# Patient Record
Sex: Male | Born: 1945 | Race: Black or African American | Hispanic: No | Marital: Married | Smoking: Former smoker
Health system: Southern US, Community
[De-identification: ages and names within clinical notes are randomized; demographics above are authoritative.]

## PROBLEM LIST (undated history)

## (undated) DIAGNOSIS — Z5189 Encounter for other specified aftercare: Secondary | ICD-10-CM

## (undated) DIAGNOSIS — N186 End stage renal disease: Secondary | ICD-10-CM

## (undated) DIAGNOSIS — I10 Essential (primary) hypertension: Secondary | ICD-10-CM

## (undated) DIAGNOSIS — R410 Disorientation, unspecified: Secondary | ICD-10-CM

## (undated) DIAGNOSIS — E785 Hyperlipidemia, unspecified: Secondary | ICD-10-CM

## (undated) DIAGNOSIS — R29818 Other symptoms and signs involving the nervous system: Secondary | ICD-10-CM

## (undated) DIAGNOSIS — Z992 Dependence on renal dialysis: Secondary | ICD-10-CM

## (undated) DIAGNOSIS — N281 Cyst of kidney, acquired: Secondary | ICD-10-CM

## (undated) DIAGNOSIS — T7840XA Allergy, unspecified, initial encounter: Secondary | ICD-10-CM

## (undated) DIAGNOSIS — C801 Malignant (primary) neoplasm, unspecified: Secondary | ICD-10-CM

## (undated) DIAGNOSIS — D509 Iron deficiency anemia, unspecified: Secondary | ICD-10-CM

## (undated) HISTORY — DX: Malignant (primary) neoplasm, unspecified: C80.1

## (undated) HISTORY — DX: Dependence on renal dialysis: Z99.2

## (undated) HISTORY — DX: Essential (primary) hypertension: I10

## (undated) HISTORY — DX: Allergy, unspecified, initial encounter: T78.40XA

## (undated) HISTORY — DX: End stage renal disease: N18.6

## (undated) HISTORY — DX: Disorientation, unspecified: R41.0

## (undated) HISTORY — DX: Cyst of kidney, acquired: N28.1

## (undated) HISTORY — DX: Iron deficiency anemia, unspecified: D50.9

## (undated) HISTORY — DX: Encounter for other specified aftercare: Z51.89

## (undated) HISTORY — DX: Hyperlipidemia, unspecified: E78.5

---
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
## 1972-01-26 DIAGNOSIS — I10 Essential (primary) hypertension: Secondary | ICD-10-CM

## 1972-01-26 HISTORY — DX: Essential (primary) hypertension: I10

## 1990-01-25 HISTORY — PX: ACHILLES TENDON REPAIR: SUR1153

## 1997-01-25 DIAGNOSIS — E785 Hyperlipidemia, unspecified: Secondary | ICD-10-CM

## 1997-01-25 HISTORY — DX: Hyperlipidemia, unspecified: E78.5

## 1997-10-04 ENCOUNTER — Encounter: Admission: RE | Admit: 1997-10-04 | Discharge: 1997-10-04 | Payer: Self-pay | Admitting: *Deleted

## 1998-01-25 HISTORY — PX: MR MRA ABDOMEN: HXRAD3

## 1998-01-25 HISTORY — PX: NEPHRECTOMY: SHX65

## 1998-07-09 ENCOUNTER — Encounter: Payer: Self-pay | Admitting: Urology

## 1998-07-09 ENCOUNTER — Ambulatory Visit (HOSPITAL_COMMUNITY): Admission: RE | Admit: 1998-07-09 | Discharge: 1998-07-09 | Payer: Self-pay | Admitting: Urology

## 1998-07-18 ENCOUNTER — Ambulatory Visit (HOSPITAL_COMMUNITY): Admission: RE | Admit: 1998-07-18 | Discharge: 1998-07-18 | Payer: Self-pay | Admitting: Urology

## 1998-07-18 ENCOUNTER — Encounter: Payer: Self-pay | Admitting: Urology

## 1998-12-16 ENCOUNTER — Emergency Department (HOSPITAL_COMMUNITY): Admission: EM | Admit: 1998-12-16 | Discharge: 1998-12-16 | Payer: Self-pay | Admitting: Emergency Medicine

## 2000-06-25 ENCOUNTER — Encounter: Payer: Self-pay | Admitting: Family Medicine

## 2000-06-25 LAB — CONVERTED CEMR LAB: PSA: 2.9 ng/mL

## 2001-07-20 ENCOUNTER — Ambulatory Visit (HOSPITAL_COMMUNITY): Admission: RE | Admit: 2001-07-20 | Discharge: 2001-07-20 | Payer: Self-pay | Admitting: Internal Medicine

## 2001-08-01 ENCOUNTER — Encounter: Payer: Self-pay | Admitting: Internal Medicine

## 2001-08-01 ENCOUNTER — Ambulatory Visit (HOSPITAL_COMMUNITY): Admission: RE | Admit: 2001-08-01 | Discharge: 2001-08-01 | Payer: Self-pay | Admitting: Internal Medicine

## 2002-07-26 ENCOUNTER — Encounter: Payer: Self-pay | Admitting: Family Medicine

## 2002-07-27 ENCOUNTER — Encounter: Payer: Self-pay | Admitting: Family Medicine

## 2002-07-27 LAB — CONVERTED CEMR LAB: PSA: 1.65 ng/mL

## 2002-07-31 ENCOUNTER — Encounter: Payer: Self-pay | Admitting: Family Medicine

## 2002-07-31 LAB — CONVERTED CEMR LAB: WBC, blood: 4.7 10*3/uL

## 2002-11-06 ENCOUNTER — Encounter: Payer: Self-pay | Admitting: Family Medicine

## 2002-11-19 ENCOUNTER — Encounter: Payer: Self-pay | Admitting: Family Medicine

## 2002-11-19 LAB — CONVERTED CEMR LAB: Blood Glucose, Fasting: 89 mg/dL

## 2002-11-30 ENCOUNTER — Encounter: Admission: RE | Admit: 2002-11-30 | Discharge: 2002-11-30 | Payer: Self-pay | Admitting: Internal Medicine

## 2003-01-30 ENCOUNTER — Encounter: Payer: Self-pay | Admitting: Family Medicine

## 2003-01-30 LAB — CONVERTED CEMR LAB: Blood Glucose, Fasting: 94 mg/dL

## 2003-03-13 ENCOUNTER — Encounter: Payer: Self-pay | Admitting: Family Medicine

## 2003-03-13 LAB — CONVERTED CEMR LAB: Blood Glucose, Fasting: 96 mg/dL

## 2003-04-24 ENCOUNTER — Encounter: Payer: Self-pay | Admitting: Family Medicine

## 2003-05-15 ENCOUNTER — Ambulatory Visit (HOSPITAL_COMMUNITY): Admission: RE | Admit: 2003-05-15 | Discharge: 2003-05-15 | Payer: Self-pay | Admitting: Gastroenterology

## 2003-05-15 ENCOUNTER — Encounter (INDEPENDENT_AMBULATORY_CARE_PROVIDER_SITE_OTHER): Payer: Self-pay | Admitting: *Deleted

## 2003-05-15 HISTORY — PX: COLONOSCOPY: SHX174

## 2003-07-03 ENCOUNTER — Encounter: Payer: Self-pay | Admitting: Family Medicine

## 2003-07-03 LAB — CONVERTED CEMR LAB
Blood Glucose, Fasting: 109 mg/dL
RBC count: 4.12 10*6/uL
WBC, blood: 5.1 10*3/uL

## 2003-07-15 ENCOUNTER — Encounter: Payer: Self-pay | Admitting: Family Medicine

## 2003-07-15 LAB — CONVERTED CEMR LAB
Blood Glucose, Fasting: 71 mg/dL
RBC count: 4.35 10*6/mm3
WBC, blood: 4.5 10*3/mm3

## 2004-01-13 ENCOUNTER — Encounter: Payer: Self-pay | Admitting: Family Medicine

## 2004-01-13 LAB — CONVERTED CEMR LAB: RBC count: 4.24 10*6/uL

## 2004-04-21 ENCOUNTER — Encounter: Payer: Self-pay | Admitting: Family Medicine

## 2004-04-21 LAB — CONVERTED CEMR LAB
Blood Glucose, Fasting: 85 mg/dL
RBC count: 4.2 10*6/uL
WBC, blood: 4.4 10*3/uL

## 2004-04-27 ENCOUNTER — Encounter: Payer: Self-pay | Admitting: Family Medicine

## 2004-04-27 LAB — CONVERTED CEMR LAB
Blood Glucose, Fasting: 94 mg/dL
WBC, blood: 4.8 10*3/uL

## 2004-07-20 ENCOUNTER — Encounter: Payer: Self-pay | Admitting: Family Medicine

## 2004-07-26 ENCOUNTER — Emergency Department (HOSPITAL_COMMUNITY): Admission: EM | Admit: 2004-07-26 | Discharge: 2004-07-26 | Payer: Self-pay | Admitting: Emergency Medicine

## 2004-08-17 ENCOUNTER — Encounter: Payer: Self-pay | Admitting: Family Medicine

## 2004-08-17 LAB — CONVERTED CEMR LAB
RBC count: 3.97 10*6/uL
WBC, blood: 11.1 10*3/uL

## 2004-10-26 ENCOUNTER — Encounter: Payer: Self-pay | Admitting: Family Medicine

## 2004-10-26 LAB — CONVERTED CEMR LAB
Blood Glucose, Fasting: 91 mg/dL
RBC count: 4.08 10*6/uL
WBC, blood: 7 10*3/uL

## 2004-11-09 ENCOUNTER — Encounter: Admission: RE | Admit: 2004-11-09 | Discharge: 2004-11-09 | Payer: Self-pay | Admitting: Orthopedic Surgery

## 2004-11-15 ENCOUNTER — Encounter: Admission: RE | Admit: 2004-11-15 | Discharge: 2004-11-15 | Payer: Self-pay | Admitting: Orthopedic Surgery

## 2004-12-01 ENCOUNTER — Encounter: Admission: RE | Admit: 2004-12-01 | Discharge: 2004-12-01 | Payer: Self-pay | Admitting: Orthopedic Surgery

## 2004-12-21 ENCOUNTER — Encounter: Payer: Self-pay | Admitting: Family Medicine

## 2004-12-21 LAB — CONVERTED CEMR LAB: Blood Glucose, Fasting: 87 mg/dL

## 2005-03-10 ENCOUNTER — Encounter: Payer: Self-pay | Admitting: Family Medicine

## 2005-03-15 ENCOUNTER — Encounter: Payer: Self-pay | Admitting: Family Medicine

## 2005-03-15 LAB — CONVERTED CEMR LAB: RBC count: 3.57 10*6/uL

## 2005-03-22 ENCOUNTER — Encounter: Payer: Self-pay | Admitting: Family Medicine

## 2005-03-22 LAB — CONVERTED CEMR LAB: Blood Glucose, Fasting: 89 mg/dL

## 2005-04-21 ENCOUNTER — Encounter: Payer: Self-pay | Admitting: Family Medicine

## 2005-04-21 LAB — CONVERTED CEMR LAB
Blood Glucose, Fasting: 86 mg/dL
RBC count: 3.73 10*6/uL
WBC, blood: 5.1 10*3/uL

## 2005-07-16 ENCOUNTER — Ambulatory Visit: Payer: Self-pay | Admitting: Family Medicine

## 2005-12-14 ENCOUNTER — Ambulatory Visit: Payer: Self-pay | Admitting: Family Medicine

## 2005-12-14 LAB — CONVERTED CEMR LAB
Blood Glucose, Fasting: 77 mg/dL
TSH: 0.47 microintl units/mL

## 2005-12-21 ENCOUNTER — Ambulatory Visit: Payer: Self-pay | Admitting: Family Medicine

## 2006-01-11 ENCOUNTER — Ambulatory Visit: Payer: Self-pay | Admitting: Family Medicine

## 2006-01-23 ENCOUNTER — Emergency Department (HOSPITAL_COMMUNITY): Admission: EM | Admit: 2006-01-23 | Discharge: 2006-01-23 | Payer: Self-pay | Admitting: Family Medicine

## 2006-01-23 ENCOUNTER — Encounter: Payer: Self-pay | Admitting: Family Medicine

## 2006-01-23 LAB — CONVERTED CEMR LAB
RBC count: 4.21 10*6/uL
WBC, blood: 11.5 10*3/uL

## 2006-01-26 ENCOUNTER — Ambulatory Visit: Payer: Self-pay | Admitting: Family Medicine

## 2006-02-20 ENCOUNTER — Encounter: Admission: RE | Admit: 2006-02-20 | Discharge: 2006-02-20 | Payer: Self-pay | Admitting: Orthopedic Surgery

## 2006-03-03 HISTORY — PX: CT ABD W & PELVIS WO CM: HXRAD294

## 2006-03-03 HISTORY — PX: CYSTOSCOPY: SUR368

## 2006-03-10 ENCOUNTER — Ambulatory Visit: Payer: Self-pay | Admitting: Family Medicine

## 2006-03-10 LAB — CONVERTED CEMR LAB
BUN: 81 mg/dL — ABNORMAL HIGH (ref 6–23)
CO2: 31 meq/L (ref 19–32)
Chloride: 95 meq/L — ABNORMAL LOW (ref 96–112)
Creatinine, Ser: 10.3 mg/dL (ref 0.4–1.5)
Potassium: 3.4 meq/L — ABNORMAL LOW (ref 3.5–5.1)
Sodium: 140 meq/L (ref 135–145)
Uric Acid, Serum: 6.8 mg/dL (ref 2.4–7.0)

## 2006-03-14 ENCOUNTER — Ambulatory Visit: Payer: Self-pay | Admitting: Family Medicine

## 2006-05-28 ENCOUNTER — Ambulatory Visit: Payer: Self-pay | Admitting: Family Medicine

## 2006-06-20 HISTORY — PX: KIDNEY TRANSPLANT: SHX239

## 2006-12-23 ENCOUNTER — Encounter: Payer: Self-pay | Admitting: Family Medicine

## 2006-12-23 DIAGNOSIS — Z87898 Personal history of other specified conditions: Secondary | ICD-10-CM | POA: Insufficient documentation

## 2006-12-23 DIAGNOSIS — Z87448 Personal history of other diseases of urinary system: Secondary | ICD-10-CM | POA: Insufficient documentation

## 2006-12-23 DIAGNOSIS — Z87891 Personal history of nicotine dependence: Secondary | ICD-10-CM | POA: Insufficient documentation

## 2006-12-26 ENCOUNTER — Ambulatory Visit: Payer: Self-pay | Admitting: Family Medicine

## 2006-12-26 DIAGNOSIS — I10 Essential (primary) hypertension: Secondary | ICD-10-CM | POA: Insufficient documentation

## 2006-12-26 DIAGNOSIS — D509 Iron deficiency anemia, unspecified: Secondary | ICD-10-CM | POA: Insufficient documentation

## 2006-12-26 DIAGNOSIS — M109 Gout, unspecified: Secondary | ICD-10-CM | POA: Insufficient documentation

## 2006-12-26 DIAGNOSIS — E785 Hyperlipidemia, unspecified: Secondary | ICD-10-CM | POA: Insufficient documentation

## 2006-12-26 LAB — CONVERTED CEMR LAB
ALT: 12 units/L (ref 0–53)
Basophils Absolute: 0 10*3/uL (ref 0.0–0.1)
Calcium: 9.5 mg/dL (ref 8.4–10.5)
Chloride: 107 meq/L (ref 96–112)
Creatinine, Ser: 2.5 mg/dL — ABNORMAL HIGH (ref 0.4–1.5)
Eosinophils Absolute: 0 10*3/uL (ref 0.0–0.6)
Eosinophils Relative: 0.7 % (ref 0.0–5.0)
GFR calc non Af Amer: 28 mL/min
LDL Cholesterol: 117 mg/dL — ABNORMAL HIGH (ref 0–99)
MCV: 84.2 fL (ref 78.0–100.0)
PSA: 1.62 ng/mL (ref 0.10–4.00)
Platelets: 282 10*3/uL (ref 150–400)
RBC: 3.35 M/uL — ABNORMAL LOW (ref 4.22–5.81)
RDW: 13.1 % (ref 11.5–14.6)
Total CHOL/HDL Ratio: 3.1
Triglycerides: 88 mg/dL (ref 0–149)
Uric Acid, Serum: 6.8 mg/dL (ref 2.4–7.0)
WBC: 5.3 10*3/uL (ref 4.5–10.5)

## 2006-12-29 ENCOUNTER — Ambulatory Visit: Payer: Self-pay | Admitting: Family Medicine

## 2007-02-10 ENCOUNTER — Ambulatory Visit: Payer: Self-pay | Admitting: Family Medicine

## 2007-02-15 ENCOUNTER — Ambulatory Visit: Payer: Self-pay | Admitting: Family Medicine

## 2007-02-23 ENCOUNTER — Encounter: Payer: Self-pay | Admitting: Family Medicine

## 2007-02-28 ENCOUNTER — Ambulatory Visit: Payer: Self-pay | Admitting: Family Medicine

## 2007-03-01 ENCOUNTER — Telehealth (INDEPENDENT_AMBULATORY_CARE_PROVIDER_SITE_OTHER): Payer: Self-pay | Admitting: *Deleted

## 2007-05-02 ENCOUNTER — Ambulatory Visit: Payer: Self-pay | Admitting: Family Medicine

## 2007-06-06 ENCOUNTER — Ambulatory Visit: Payer: Self-pay | Admitting: Family Medicine

## 2007-06-15 ENCOUNTER — Ambulatory Visit: Payer: Self-pay | Admitting: Family Medicine

## 2007-06-15 DIAGNOSIS — E876 Hypokalemia: Secondary | ICD-10-CM | POA: Insufficient documentation

## 2007-06-15 LAB — CONVERTED CEMR LAB
Calcium: 8.7 mg/dL (ref 8.4–10.5)
Creatinine, Ser: 3.3 mg/dL — ABNORMAL HIGH (ref 0.4–1.5)
GFR calc Af Amer: 24 mL/min
GFR calc non Af Amer: 20 mL/min

## 2007-06-24 ENCOUNTER — Encounter: Payer: Self-pay | Admitting: Family Medicine

## 2007-06-24 LAB — CONVERTED CEMR LAB
Calcium: 8.7 mg/dL (ref 8.4–10.5)
Glucose, Bld: 100 mg/dL — ABNORMAL HIGH (ref 70–99)
Sodium: 144 meq/L (ref 135–145)

## 2007-06-26 LAB — CONVERTED CEMR LAB
BUN: 42 mg/dL — ABNORMAL HIGH (ref 6–23)
Calcium: 9.1 mg/dL (ref 8.4–10.5)
Creatinine, Ser: 3.4 mg/dL — ABNORMAL HIGH (ref 0.4–1.5)
GFR calc Af Amer: 24 mL/min
GFR calc non Af Amer: 20 mL/min

## 2007-07-17 ENCOUNTER — Ambulatory Visit: Payer: Self-pay | Admitting: Family Medicine

## 2007-08-04 ENCOUNTER — Encounter: Payer: Self-pay | Admitting: Family Medicine

## 2007-10-25 ENCOUNTER — Ambulatory Visit: Payer: Self-pay | Admitting: Family Medicine

## 2007-11-22 ENCOUNTER — Encounter (INDEPENDENT_AMBULATORY_CARE_PROVIDER_SITE_OTHER): Payer: Self-pay | Admitting: *Deleted

## 2007-11-22 ENCOUNTER — Ambulatory Visit: Payer: Self-pay | Admitting: Family Medicine

## 2007-11-22 LAB — CONVERTED CEMR LAB
OCCULT 1: NEGATIVE
OCCULT 3: NEGATIVE

## 2007-12-25 ENCOUNTER — Ambulatory Visit: Payer: Self-pay | Admitting: Family Medicine

## 2007-12-25 LAB — CONVERTED CEMR LAB
ALT: 14 units/L (ref 0–53)
AST: 22 units/L (ref 0–37)
Albumin: 3.4 g/dL — ABNORMAL LOW (ref 3.5–5.2)
BUN: 33 mg/dL — ABNORMAL HIGH (ref 6–23)
Chloride: 111 meq/L (ref 96–112)
Cholesterol: 226 mg/dL (ref 0–200)
Creatinine, Ser: 3.4 mg/dL — ABNORMAL HIGH (ref 0.4–1.5)
GFR calc non Af Amer: 20 mL/min
HCT: 27.5 % — ABNORMAL LOW (ref 39.0–52.0)
Hemoglobin: 9.2 g/dL — ABNORMAL LOW (ref 13.0–17.0)
Iron: 42 ug/dL (ref 42–165)
Phosphorus: 3.4 mg/dL (ref 2.3–4.6)
RDW: 14.5 % (ref 11.5–14.6)
Total CHOL/HDL Ratio: 3.3
Total Protein: 6.6 g/dL (ref 6.0–8.3)

## 2007-12-27 ENCOUNTER — Ambulatory Visit: Payer: Self-pay | Admitting: Family Medicine

## 2008-02-09 ENCOUNTER — Encounter: Payer: Self-pay | Admitting: Family Medicine

## 2008-03-25 ENCOUNTER — Encounter: Payer: Self-pay | Admitting: Family Medicine

## 2008-03-31 LAB — CONVERTED CEMR LAB
ALT: 11 units/L (ref 0–53)
AST: 17 units/L (ref 0–37)
Total CHOL/HDL Ratio: 4.9
VLDL: 34 mg/dL (ref 0–40)

## 2008-04-01 ENCOUNTER — Ambulatory Visit: Payer: Self-pay | Admitting: Family Medicine

## 2008-04-01 DIAGNOSIS — R5381 Other malaise: Secondary | ICD-10-CM | POA: Insufficient documentation

## 2008-04-01 DIAGNOSIS — R5383 Other fatigue: Secondary | ICD-10-CM

## 2008-04-01 LAB — CONVERTED CEMR LAB
Albumin: 3.2 g/dL — ABNORMAL LOW (ref 3.5–5.2)
Basophils Absolute: 0 10*3/uL (ref 0.0–0.1)
Blood in Urine, dipstick: NEGATIVE
CO2: 25 meq/L (ref 19–32)
Chloride: 108 meq/L (ref 96–112)
GFR calc Af Amer: 15 mL/min
GFR calc non Af Amer: 13 mL/min
Glucose, Urine, Semiquant: NEGATIVE
Ketones, urine, test strip: NEGATIVE
Lymphocytes Relative: 18.1 % (ref 12.0–46.0)
MCHC: 33.4 g/dL (ref 30.0–36.0)
Neutro Abs: 4.7 10*3/uL (ref 1.4–7.7)
Neutrophils Relative %: 66.2 % (ref 43.0–77.0)
Nitrite: NEGATIVE
Platelets: 175 10*3/uL (ref 150–400)
Potassium: 4.1 meq/L (ref 3.5–5.1)
Protein, U semiquant: 100
RDW: 13.6 % (ref 11.5–14.6)
Sodium: 141 meq/L (ref 135–145)
Uric Acid, Serum: 6.9 mg/dL (ref 4.0–7.8)
WBC Urine, dipstick: NEGATIVE

## 2008-05-09 ENCOUNTER — Encounter: Payer: Self-pay | Admitting: Family Medicine

## 2008-07-18 ENCOUNTER — Encounter: Payer: Self-pay | Admitting: Family Medicine

## 2008-07-18 HISTORY — PX: COLONOSCOPY: SHX174

## 2008-08-09 ENCOUNTER — Emergency Department (HOSPITAL_COMMUNITY): Admission: EM | Admit: 2008-08-09 | Discharge: 2008-08-09 | Payer: Self-pay | Admitting: Emergency Medicine

## 2008-08-14 ENCOUNTER — Ambulatory Visit: Payer: Self-pay | Admitting: Family Medicine

## 2008-08-14 DIAGNOSIS — M62838 Other muscle spasm: Secondary | ICD-10-CM | POA: Insufficient documentation

## 2008-10-03 ENCOUNTER — Encounter: Payer: Self-pay | Admitting: Family Medicine

## 2008-10-14 ENCOUNTER — Encounter: Payer: Self-pay | Admitting: Family Medicine

## 2008-11-13 ENCOUNTER — Encounter: Payer: Self-pay | Admitting: Family Medicine

## 2008-11-18 ENCOUNTER — Encounter: Payer: Self-pay | Admitting: Family Medicine

## 2008-11-19 ENCOUNTER — Telehealth: Payer: Self-pay | Admitting: Family Medicine

## 2008-11-25 ENCOUNTER — Encounter: Payer: Self-pay | Admitting: Family Medicine

## 2008-11-28 ENCOUNTER — Encounter: Payer: Self-pay | Admitting: Family Medicine

## 2008-12-05 ENCOUNTER — Encounter: Payer: Self-pay | Admitting: Family Medicine

## 2008-12-23 ENCOUNTER — Encounter: Payer: Self-pay | Admitting: Family Medicine

## 2009-01-08 ENCOUNTER — Encounter: Payer: Self-pay | Admitting: Family Medicine

## 2009-02-17 ENCOUNTER — Emergency Department: Payer: Self-pay | Admitting: Emergency Medicine

## 2009-02-18 ENCOUNTER — Ambulatory Visit: Payer: Self-pay | Admitting: Family Medicine

## 2009-02-18 DIAGNOSIS — M79609 Pain in unspecified limb: Secondary | ICD-10-CM | POA: Insufficient documentation

## 2009-02-19 ENCOUNTER — Encounter: Admission: RE | Admit: 2009-02-19 | Discharge: 2009-02-19 | Payer: Self-pay | Admitting: Family Medicine

## 2009-02-24 ENCOUNTER — Encounter: Payer: Self-pay | Admitting: Family Medicine

## 2009-03-10 ENCOUNTER — Encounter: Payer: Self-pay | Admitting: Family Medicine

## 2009-03-19 ENCOUNTER — Encounter: Payer: Self-pay | Admitting: Family Medicine

## 2009-03-21 ENCOUNTER — Encounter: Payer: Self-pay | Admitting: Family Medicine

## 2009-03-24 ENCOUNTER — Encounter: Payer: Self-pay | Admitting: Family Medicine

## 2009-05-02 ENCOUNTER — Encounter: Payer: Self-pay | Admitting: Family Medicine

## 2009-06-18 ENCOUNTER — Ambulatory Visit: Payer: Self-pay | Admitting: Family Medicine

## 2009-06-18 DIAGNOSIS — T7840XA Allergy, unspecified, initial encounter: Secondary | ICD-10-CM | POA: Insufficient documentation

## 2009-06-26 ENCOUNTER — Encounter: Payer: Self-pay | Admitting: Family Medicine

## 2009-08-19 ENCOUNTER — Ambulatory Visit: Payer: Self-pay | Admitting: Family Medicine

## 2009-08-19 DIAGNOSIS — N529 Male erectile dysfunction, unspecified: Secondary | ICD-10-CM | POA: Insufficient documentation

## 2009-09-02 ENCOUNTER — Encounter (INDEPENDENT_AMBULATORY_CARE_PROVIDER_SITE_OTHER): Payer: Self-pay | Admitting: *Deleted

## 2009-10-21 ENCOUNTER — Telehealth: Payer: Self-pay | Admitting: Family Medicine

## 2009-10-29 ENCOUNTER — Ambulatory Visit: Payer: Self-pay | Admitting: Family Medicine

## 2009-10-30 ENCOUNTER — Telehealth: Payer: Self-pay | Admitting: Family Medicine

## 2009-12-15 ENCOUNTER — Encounter: Payer: Self-pay | Admitting: Family Medicine

## 2009-12-23 ENCOUNTER — Ambulatory Visit: Payer: Self-pay | Admitting: Family Medicine

## 2009-12-24 ENCOUNTER — Encounter (INDEPENDENT_AMBULATORY_CARE_PROVIDER_SITE_OTHER): Payer: Self-pay | Admitting: *Deleted

## 2009-12-24 LAB — CONVERTED CEMR LAB
Albumin: 3.5 g/dL (ref 3.5–5.2)
Alkaline Phosphatase: 68 units/L (ref 39–117)
BUN: 84 mg/dL — ABNORMAL HIGH (ref 6–23)
Basophils Relative: 1.1 % (ref 0.0–3.0)
Calcium: 8.2 mg/dL — ABNORMAL LOW (ref 8.4–10.5)
GFR calc non Af Amer: 4.04 mL/min (ref >60)
Glucose, Bld: 75 mg/dL (ref 70–99)
Hemoglobin: 10.3 g/dL — ABNORMAL LOW (ref 13.0–17.0)
Lymphocytes Relative: 20.8 % (ref 12.0–46.0)
Monocytes Relative: 14.5 % — ABNORMAL HIGH (ref 3.0–12.0)
Neutro Abs: 3 10*3/uL (ref 1.4–7.7)
RBC: 3.4 M/uL — ABNORMAL LOW (ref 4.22–5.81)
Sodium: 142 meq/L (ref 135–145)
TSH: 0.69 microintl units/mL (ref 0.35–5.50)

## 2009-12-25 ENCOUNTER — Ambulatory Visit: Payer: Self-pay | Admitting: Family Medicine

## 2009-12-26 ENCOUNTER — Encounter: Payer: Self-pay | Admitting: Family Medicine

## 2010-01-02 ENCOUNTER — Telehealth: Payer: Self-pay | Admitting: Family Medicine

## 2010-01-13 ENCOUNTER — Encounter: Payer: Self-pay | Admitting: Family Medicine

## 2010-01-22 ENCOUNTER — Ambulatory Visit
Admission: RE | Admit: 2010-01-22 | Discharge: 2010-01-22 | Payer: Self-pay | Source: Home / Self Care | Attending: Family Medicine | Admitting: Family Medicine

## 2010-01-22 DIAGNOSIS — H1130 Conjunctival hemorrhage, unspecified eye: Secondary | ICD-10-CM | POA: Insufficient documentation

## 2010-02-14 ENCOUNTER — Encounter: Payer: Self-pay | Admitting: Urology

## 2010-02-24 NOTE — Assessment & Plan Note (Signed)
Summary: NOT FEELING WELL/CLE   Vital Signs:  Patient profile:   65 year old male Height:      70 inches Weight:      197.25 pounds BMI:     28.40 Temp:     98.2 degrees F oral Pulse rate:   76 / minute Pulse rhythm:   regular BP sitting:   130 / 70  (left arm) Cuff size:   large  Vitals Entered By: Delilah Shan CMA Duncan Dull) (December 23, 2009 3:36 PM) CC: Not Feeling Well   History of Present Illness: Fatigued.  Going on for "a while now."   Weeks of symptoms. This week it increased. Waking up tired.  No FCNAV except for 1 episode of vomiting on Thanksgiving but no GI sx then.  Feels cold "all the time."  No cough, no diarrhea.  Last seen at [facility] last Monday.  Last labs done about 1 month ago at PD clinic.  Off prednisone, allopurinol, omeprazole and prograf.  Occ mild nasal congestion.   No orthostatic symptoms. No increase in edema.  No CP, SOB.  Sleeping on 1 pillow.   Allergies: 1)  ! * No Nephrotoxic Medications 2nd To Only Partial Left Kidney 2)  ! * Iodine/ Adhesive Tape  Review of Systems       See HPI.  Otherwise negative.    Physical Exam  General:  GEN: nad, alert and oriented HEENT: mucous membranes moist NECK: supple w/o LA CV: rrr.  no murmur PULM: ctab, no inc wob ABD: soft, +bs, port site clean EXT: trace ble edema SKIN: no acute rash   Impression & Recommendations:  Problem # 1:  MALAISE AND FATIGUE (ICD-780.79) >25 min spent with patient face to face.  No localizing symptoms or findings.  I would check labs below and follow clinically.  I told patient that I couldn't identify the source of his fatigue yet but that this would be a reasonable place to start.  he agreed.  Okay for outpatient follow up.  will contact with labs.  Orders: TLB-BMP (Basic Metabolic Panel-BMET) (80048-METABOL) TLB-Hepatic/Liver Function Pnl (80076-HEPATIC) TLB-TSH (Thyroid Stimulating Hormone) (84443-TSH) TLB-CBC Platelet - w/Differential (85025-CBCD)  Complete  Medication List: 1)  Amlodipine Besylate 10 Mg Tabs (Amlodipine besylate) .Marland Kitchen.. 1 daily by mouth 2)  Furosemide 40 Mg Tabs (Furosemide) .... 4 tablets daily by mouth 3)  Simvastatin 20 Mg Tabs (Simvastatin) .Marland Kitchen.. 1 by mouth qday 4)  Metolazone 2.5 Mg Tabs (Metolazone) .... Take one by mouth daily 5)  Hectorol 0.5 Mcg Caps (Doxercalciferol) .... Take one by mouth daily 6)  Sensipar 30 Mg Tabs (Cinacalcet hcl) .... Take 1 tablet by mouth once a day 7)  Viagra 100 Mg Tabs (Sildenafil citrate) .Marland Kitchen.. 1 tab by mouth qday prn 8)  Toprol Xl 100 Mg Xr24h-tab (Metoprolol succinate) .... Take 1 tablet by mouth once a day  Patient Instructions: 1)  We'll let you know about your labs.  I wouldn't change your meds in the meantime.  Let me know if you notice any changes.     Orders Added: 1)  Est. Patient Level IV [69678] 2)  TLB-BMP (Basic Metabolic Panel-BMET) [80048-METABOL] 3)  TLB-Hepatic/Liver Function Pnl [80076-HEPATIC] 4)  TLB-TSH (Thyroid Stimulating Hormone) [84443-TSH] 5)  TLB-CBC Platelet - w/Differential [85025-CBCD]    Current Allergies (reviewed today): ! * NO NEPHROTOXIC MEDICATIONS 2ND TO ONLY PARTIAL LEFT KIDNEY ! * IODINE/ ADHESIVE TAPE

## 2010-02-24 NOTE — Letter (Signed)
Summary: [facility]  [facility]   Imported By: Maryln Gottron 03/04/2009 15:44:06  _____________________________________________________________________  External Attachment:    Type:   Image     Comment:   External Document

## 2010-02-24 NOTE — Letter (Signed)
Summary: [facility] ([place])   Imported By: Sherian Rein 12/30/2009 08:18:22  _____________________________________________________________________  External Attachment:    Type:   Image     Comment:   External Document

## 2010-02-24 NOTE — Assessment & Plan Note (Signed)
Summary: PROBLEM W/ THROAT, RUNNY NOSE, CONGESTION CYD   Vital Signs:  Patient profile:   65 year old male Weight:      189.25 pounds BMI:     27.25 Temp:     97.9 degrees F oral Pulse rate:   76 / minute Pulse rhythm:   regular BP sitting:   120 / 72  (left arm) Cuff size:   regular  Vitals Entered By: Sydell Axon LPN (Jun 18, 2009 11:22 AM) CC: Throat hurts, runny nose, nasal congestion, symptoms for 2 weeks   History of Present Illness: Pt here today for a cold he has had for a couple of weeks, Throat hurts, sneezing with runny nose and coughing nonproductively. His  eyes aare also itching. He is taking Claritin but not every day. He knows not to take decongestants. He is not achey and has had no fever or chills. He hasn't been here in a while and was told to schedule a Comp Exam.  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 1 Daily By Mouth 2)  Multivitamins   Tabs (Multiple Vitamin) .Marland Kitchen.. 1daily By Mouth 3)  Prograf 1 Mg Caps (Tacrolimus) .... Take Two By Mouth Two Times A Day 4)  Prednisone 5 Mg Tabs (Prednisone) .Marland Kitchen.. 1 By Mouth Every Day 5)  Potassium Chloride Cr 8 Meq  Tbcr (Potassium Chloride) .... One Tab By Mouth Once Daily 6)  Amlodipine Besylate 10 Mg  Tabs (Amlodipine Besylate) .Marland Kitchen.. 1 Daily By Mouth 7)  Furosemide 40 Mg Tabs (Furosemide) .... 2 Tablets Daily By Mouth 8)  Simvastatin 40 Mg Tabs (Simvastatin) .... One Tab Poat Night 9)  Omeprazole 20 Mg Cpdr (Omeprazole) .Marland Kitchen.. 1 Daily By Mouth 10)  Coreg 25 Mg Tabs (Carvedilol) .... Take One By Mouth Two Times A Day 11)  Metolazone 2.5 Mg Tabs (Metolazone) .... Take One By Mouth Daily 12)  Flomax 0.4 Mg Caps (Tamsulosin Hcl) .... One Tab By Mouth Qd  Allergies: 1)  ! * No Nephrotoxic Medications 2nd To Only Partial Left Kidney 2)  ! * Iodine/ Adhesive Tape  Physical Exam  General:  Well-developed,well-nourished,in no acute distress; alert,appropriate and cooperative throughout examination, mildly inflamed. Head:  Normocephalic and atraumatic without obvious abnormalities. No apparent alopecia or balding. Sinuses NT. Eyes:  Conjunctiva clear bilaterally except lower palpebral eryhtema with good tearing. No discharge seen. Ears:  External ear exam shows no significant lesions or deformities.  Otoscopic examination reveals clear canals, tympanic membranes are intact bilaterally without bulging, retraction, inflammation or discharge. Hearing is grossly normal bilaterally. Nose:  External nasal examination shows no deformity or inflammation. Nasal mucosa are pink and moist without lesions or exudates. Mouth:  Oral mucosa and oropharynx without lesions or exudates.  Teeth in good repair. Neck:  No  deformities, masses, or tenderness noted. Lungs:  Normal respiratory effort, chest expands symmetrically. Lungs are clear to auscultation, no crackles or wheezes. Heart:  Normal rate and regular rhythm. S1 and S2 normal without gallop, murmur, click, rub or other extra sounds. Abdomen:  Bowel sounds positive,abdomen soft and non-tender without masses, organomegaly or hernias noted.   Impression & Recommendations:  Problem # 1:  ALLERGY, ENVIRONMENTAL (ICD-995.3) Assessment Deteriorated Take  Claritin regularly daily. Take Zyrtec at night for the next few weeks.  Call if still has sneezing or itchy eyes.  Complete Medication List: 1)  Allopurinol 100 Mg Tabs (Allopurinol) .Marland Kitchen.. 1 daily by mouth 2)  Multivitamins Tabs (Multiple vitamin) .Marland Kitchen.. 1daily by mouth 3)  Prograf 1 Mg Caps (Tacrolimus) .... Take two by mouth two times a day 4)  Prednisone 5 Mg Tabs (Prednisone) .Marland Kitchen.. 1 by mouth every day 5)  Potassium Chloride Cr 8 Meq Tbcr (Potassium chloride) .... One tab by mouth once daily 6)  Amlodipine Besylate 10 Mg Tabs (Amlodipine besylate) .Marland Kitchen.. 1 daily by mouth 7)  Furosemide 40 Mg Tabs (Furosemide) .... 2 tablets daily by mouth 8)  Simvastatin 40 Mg Tabs (Simvastatin) .... Take one by mouth at bedtime 9)  Omeprazole 20 Mg Cpdr (Omeprazole) .Marland Kitchen.. 1 daily by mouth 10)  Coreg 25 Mg Tabs (Carvedilol) .... Take one by mouth two times a day 11)  Metolazone 2.5 Mg Tabs (Metolazone) .... Take one by mouth daily 12)  Flomax 0.4 Mg Caps (Tamsulosin hcl) .... One tab by mouth daily 13)  Hectorol 0.5 Mcg Caps (Doxercalciferol) .... Take one by mouth daily  Patient Instructions: 1)  Call if sxs don't improve. 2)  Make appt for Comp Exam, labs prior.  Current Allergies (reviewed today): ! * NO NEPHROTOXIC MEDICATIONS 2ND TO ONLY PARTIAL LEFT KIDNEY ! * IODINE/ ADHESIVE TAPE

## 2010-02-24 NOTE — Letter (Signed)
Summary: Nadara Eaton letter  [place] [facility]  [address] [place], [place] [address]   Phone: [phone]  Fax: [phone]       09/02/2009 MRN: [id number]  JACQUAN SAVAS [address] [place], [place]  [address]  Dear Mr. BOOMER,  [facility], and [facility] announce the retirement of Arta Silence, M.D., from full-time practice at the [facility] office effective July 24, 2009 and his plans of returning part-time.  It is important to Dr. Hetty Ely and to our practice that you understand that [facility] has seven physicians in our office for your health care needs.  We will continue to offer the same exceptional care that you have today.    Dr. Hetty Ely has spoken to many of you about his plans for retirement and returning part-time in the fall.   We will continue to work with you through the transition to schedule appointments for you in the office and meet the high standards that [facility] is committed to.   Again, it is with great pleasure that we share the news that Dr. Hetty Ely will return to [facility] in October of 2011 with a reduced schedule.    If you have any questions, or would like to request an appointment with one of our physicians, please call us at [phone] and press the option for Scheduling an appointment.  We take pleasure in providing you with excellent patient care and look forward to seeing you at your next office visit.  Our [facility] Physicians are:  Tillman Abide, M.D. Laurita Quint, M.D. Roxy Manns, M.D. Kerby Nora, M.D. Hannah Beat, M.D. Ruthe Mannan, M.D. We proudly welcomed Raechel Ache, M.D. and Eustaquio Boyden, M.D. to the practice in July/August 2011.  Sincerely,  [facility]

## 2010-02-24 NOTE — Assessment & Plan Note (Signed)
Summary: dicuss bp med/alc   Vital Signs:  Patient profile:   65 year old male Height:      70 inches Weight:      192.50 pounds BMI:     27.72 Temp:     98.5 degrees F oral Pulse rate:   72 / minute Pulse rhythm:   regular BP sitting:   140 / 72  (right arm) Cuff size:   regular  Vitals Entered By: Linde Gillis CMA Duncan Dull) (October 29, 2009 4:09 PM) CC: discuss blood pressure medicine   History of Present Illness: Here to discuss BP meds.  Prev was on cardura; off it now.  Up to 25mg  two times a day of coreg.  Concerned about sexual function.  BP had been controlled before coming in here today.  Had follow up with [facility] clinic.  Less response with viagra.  As BB (with alpha component) was increased, sexual function decreased.  prev with same response (ED) other alpha blocking agents.   Followed by Dr. Ricci Barker at [facility].  Allergies: 1)  ! * No Nephrotoxic Medications 2nd To Only Partial Left Kidney 2)  ! * Iodine/ Adhesive Tape  Past History:  Past Medical History: Anemia-iron deficiency:(PRE 2004 (/ 06/2000) Gout: right knee : left great toe:(01/2002) Hyperlipidemia(1999) Hypertension: (1974) Renal cysts per [employer].  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 Ricci Barker at [facility].   Review of Systems       See HPI.  Occ itching on the back of the neck.  Otherwise negative.  Minimal edema.  No CP.  No SOB.   Physical Exam  General:  GEN: nad, alert and oriented HEENT: mucous membranes moist NECK: supple w/o LA CV: rrr.  no murmur PULM: ctab, no inc wob ABD: soft, +bs, port site clean EXT: trace ble edema SKIN: no acute rash, neck appears normal.    Impression & Recommendations:  Problem # 1:  HYPERTENSION (ICD-401.9) I will d/w Dr. Ricci Barker at [facility] re: nonalpha component med (toprol) vs. ACE vs hydralazine.  Will d/w patient after that.  He agrees.  He can use topical hydrocortisone for oral claritin for itching as needed.  His updated medication list for this problem  includes:    Amlodipine Besylate 10 Mg Tabs (Amlodipine besylate) .Marland Kitchen... 1 daily by mouth    Furosemide 40 Mg Tabs (Furosemide) .Marland Kitchen... 2 tablets daily by mouth    Coreg 25 Mg Tabs (Carvedilol) .Marland Kitchen... Take one by mouth two times a day    Metolazone 2.5 Mg Tabs (Metolazone) .Marland Kitchen... Take one by mouth daily  Complete Medication List: 1)  Allopurinol 100 Mg Tabs (Allopurinol) .Marland Kitchen.. 1 daily by mouth 2)  Prograf 1 Mg Caps (Tacrolimus) .... Take 1/2  by mouth two times a day 3)  Prednisone 1 Mg Tabs (Prednisone) .Marland Kitchen.. 1 by mouth once daily 4)  Amlodipine Besylate 10 Mg Tabs (Amlodipine besylate) .Marland Kitchen.. 1 daily by mouth 5)  Furosemide 40 Mg Tabs (Furosemide) .... 2 tablets daily by mouth 6)  Simvastatin 20 Mg Tabs (Simvastatin) .Marland Kitchen.. 1 by mouth qday 7)  Omeprazole 20 Mg Cpdr (Omeprazole) .Marland Kitchen.. 1 daily by mouth 8)  Coreg 25 Mg Tabs (Carvedilol) .... Take one by mouth two times a day 9)  Metolazone 2.5 Mg Tabs (Metolazone) .... Take one by mouth daily 10)  Hectorol 0.5 Mcg Caps (Doxercalciferol) .... Take one by mouth daily 11)  Sensipar 30 Mg Tabs (Cinacalcet hcl) .... Take 1 tablet by mouth once a day 12)  Viagra 100  Mg Tabs (Sildenafil citrate) .Marland Kitchen.. 1 tab by mouth qday prn  Patient Instructions: 1)  I'll talk with the doctor at [facility] and I'll send word.  Don't change your meds for now.  Use 1% hydrocortisone two times a day as needed for the itching and let me know if it doen't get better.  You can also take claritin 5 mg a day as needed for itching.    Current Allergies (reviewed today): ! * NO NEPHROTOXIC MEDICATIONS 2ND TO ONLY PARTIAL LEFT KIDNEY ! * IODINE/ ADHESIVE TAPE

## 2010-02-24 NOTE — Letter (Signed)
Summary: [facility]  [facility]   Imported By: Lanelle Bal 08/06/2009 11:41:59  _____________________________________________________________________  External Attachment:    Type:   Image     Comment:   External Document  Appended Document: [facility]     Clinical Lists Changes  Observations: Added new observation of PAST MED HX: Anemia-iron deficiency:(PRE 2004 (/ 06/2000) Gout: right knee : left great toe:(01/2002) Hyperlipidemia(1999) Hypertension: (1974) Renal cysts per [employer].  (08/06/2009 21:38)        Past History:  Past Medical History: Anemia-iron deficiency:(PRE 2004 (/ 06/2000) Gout: right knee : left great toe:(01/2002) Hyperlipidemia(1999) Hypertension: (1974) Renal cysts per [employer].
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
## 2010-02-24 NOTE — Progress Notes (Signed)
Summary: refill request for viagra  Phone Note Refill Request Message from:  Fax from Pharmacy  Refills Requested: Medication #1:  VIAGRA 100 MG TABS 1 tab by mouth qday prn. Faxed request from [facility].  Initial call taken by: Lowella Petties CMA,  October 21, 2009 2:09 PM  Follow-up for Phone Call        signed. faxed.  Follow-up by: Crawford Givens MD,  October 21, 2009 3:20 PM    Prescriptions: VIAGRA 100 MG TABS (SILDENAFIL CITRATE) 1 tab by mouth qday prn  #30 x 3   Entered and Authorized by:   Crawford Givens MD   Signed by:   Crawford Givens MD on 10/21/2009   Method used:   Faxed to ...       [facility] (mail-order)             , [place]         Ph: [phone]       Fax: [phone]   RxID:   [id number]

## 2010-02-24 NOTE — Miscellaneous (Signed)
  Medications Added KLOR-CON 8 MEQ CR-TABS (POTASSIUM CHLORIDE) HOLD  Take 2 tablets by mouth once daily KAYEXALATE  POWD (SODIUM POLYSTYRENE SULFONATE) Take 15 grams today.       Clinical Lists Changes  Medications: Added new medication of KLOR-CON 8 MEQ CR-TABS (POTASSIUM CHLORIDE) HOLD  Take 2 tablets by mouth once daily Added new medication of KAYEXALATE  POWD (SODIUM POLYSTYRENE SULFONATE) Take 15 grams today.

## 2010-02-24 NOTE — Progress Notes (Signed)
Summary: Toprol XL  Phone Note Outgoing Call   Summary of Call: please call patient.  I tried to call but there was no answer.  I talked with Dr. Ricci Barker at [facility].  He is okay with substituion of toprol for coreg.  This should help with this symptoms.  I would do the following: 1. d/c coreg 2. Start toprol xl 100mg  a day. 3. Monitor BP in meantime.  If BP is consitently >130/90 next week, call me back and we can adjust the meds. 4. If he continue to have any other troubles on the new medicine, we can talk about this next week via phone.  5. please call in toprol xl 100mg  by mouth qday, #30, 5rf.  thanks.  Initial call taken by: Crawford Givens MD,  October 30, 2009 1:58 PM  Follow-up for Phone Call        Patient Advised. Medication phoned to pharmacy.  Follow-up by: Delilah Shan CMA (AAMA),  October 30, 2009 5:28 PM    New/Updated Medications: TOPROL XL 100 MG XR24H-TAB (METOPROLOL SUCCINATE) Take 1 tablet by mouth once a day Prescriptions: TOPROL XL 100 MG XR24H-TAB (METOPROLOL SUCCINATE) Take 1 tablet by mouth once a day  #30 x 5   Entered by:   Delilah Shan CMA (AAMA)   Authorized by:   Crawford Givens MD   Signed by:   Delilah Shan CMA (AAMA) on 10/30/2009   Method used:   Faxed to ...       [facility] (retail)       [address]       [place], [place]    [place]       Ph: [phone]       Fax: [phone]   RxID:   [id number]

## 2010-02-24 NOTE — Progress Notes (Signed)
  Phone Note Outgoing Call   Summary of Call: I called patient.  Still with some fatigue.  He had questions about taking pseudophed for nasal congestion.  I advised him not to take it unless it was okay with renal.  I asked him to talk with renal about his anemia and I called Dr. Hilaria Ota clinic and left message with staff.  Please send his labs from 11/29 and 12/1 to fax number [phone], attention Tammy.  They can review the labs.  Thanks.  Initial call taken by: Crawford Givens MD,  January 02, 2010 12:26 PM  Follow-up for Phone Call        Printed and faxed to above number Follow-up by: Delilah Shan CMA Duncan Dull),  January 02, 2010 12:39 PM
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
## 2010-02-26 NOTE — Assessment & Plan Note (Signed)
Summary: wants referral for urologist /alc   Vital Signs:  Patient profile:   65 year old male Height:      70 inches Weight:      199.50 pounds BMI:     28.73 Temp:     98.1 degrees F oral Pulse rate:   76 / minute Pulse rhythm:   regular BP sitting:   138 / 74  (left arm) Cuff size:   large  Vitals Entered By: Delilah Shan CMA Duncan Dull) (January 22, 2010 11:11 AM) CC: Wants referral to Urology   History of Present Illness: R eye injection.  Had been using visine w/o relief.  Sx started 1 week ago.  No pain.  no vision change.  Eye is red and hasn't resolved.  No drainage, no discharge.  No known FB.    Had some fluid accumulation and since then his PD has been adjusted.  No change in fatigue yet.  Has been on current regimen for about 1 week.  He'll follow up with renal about this.  Needs referral to [facility] (Dr. Alyson Locket).  Allergies: 1)  ! * No Nephrotoxic Medications 2nd To Only Partial Left Kidney 2)  ! * Iodine/ Adhesive Tape  Review of Systems       See HPI.  Otherwise negative.    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   Orders: Nephrology Referral (Nephro)  Problem # 2:  SUBCONJUNCTIVAL HEMORRHAGE, RIGHT (ICD-372.72) Reassured.  no intervention.  anatomy and timeline d/w patient.  follow up as needed.   Complete Medication List: 1)  Amlodipine Besylate 10 Mg Tabs (Amlodipine besylate) .Marland Kitchen.. 1 daily by mouth 2)  Furosemide 40 Mg Tabs (Furosemide) .... 4 tablets daily by mouth 3)  Simvastatin 20 Mg Tabs (Simvastatin) .Marland Kitchen.. 1 by mouth qday 4)  Metolazone 2.5 Mg Tabs (Metolazone) .... Take one by  mouth daily 5)  Hectorol 0.5 Mcg Caps (Doxercalciferol) .... Take one by mouth daily 6)  Sensipar 30 Mg Tabs (Cinacalcet hcl) .... Take 1 tablet by mouth once a day 7)  Viagra 100 Mg Tabs (Sildenafil citrate) .Marland Kitchen.. 1 tab by mouth qday prn 8)  Toprol Xl 100 Mg Xr24h-tab (Metoprolol succinate) .... Take 1 tablet by mouth once a day  Patient Instructions: 1)  See Shirlee Limerick about your referral before your leave today.   2)  Your eye should gradually get better.     Orders Added: 1)  Est. Patient Level III [47829] 2)  Nephrology Referral [Nephro]    Current Allergies (reviewed today): ! * NO NEPHROTOXIC MEDICATIONS 2ND TO ONLY PARTIAL LEFT KIDNEY ! * IODINE/ ADHESIVE TAPE

## 2010-03-20 ENCOUNTER — Inpatient Hospital Stay (HOSPITAL_COMMUNITY)
Admission: EM | Admit: 2010-03-20 | Discharge: 2010-03-24 | DRG: 682 | Disposition: A | Discharge status: Other Acute Inpatient Hospital | Payer: Medicare Other | Attending: Nephrology | Admitting: Nephrology

## 2010-03-20 DIAGNOSIS — E46 Unspecified protein-calorie malnutrition: Secondary | ICD-10-CM | POA: Diagnosis present

## 2010-03-20 DIAGNOSIS — Y830 Surgical operation with transplant of whole organ as the cause of abnormal reaction of the patient, or of later complication, without mention of misadventure at the time of the procedure: Secondary | ICD-10-CM | POA: Diagnosis present

## 2010-03-20 DIAGNOSIS — J189 Pneumonia, unspecified organism: Secondary | ICD-10-CM | POA: Diagnosis present

## 2010-03-20 DIAGNOSIS — R310 Gross hematuria: Secondary | ICD-10-CM | POA: Diagnosis present

## 2010-03-20 DIAGNOSIS — Z79899 Other long term (current) drug therapy: Secondary | ICD-10-CM

## 2010-03-20 DIAGNOSIS — T8610 Unspecified complication of kidney transplant: Secondary | ICD-10-CM | POA: Diagnosis present

## 2010-03-20 DIAGNOSIS — E871 Hypo-osmolality and hyponatremia: Secondary | ICD-10-CM | POA: Diagnosis present

## 2010-03-20 DIAGNOSIS — N2581 Secondary hyperparathyroidism of renal origin: Secondary | ICD-10-CM | POA: Diagnosis present

## 2010-03-20 DIAGNOSIS — E2749 Other adrenocortical insufficiency: Secondary | ICD-10-CM | POA: Diagnosis present

## 2010-03-20 DIAGNOSIS — Z23 Encounter for immunization: Secondary | ICD-10-CM

## 2010-03-20 DIAGNOSIS — M109 Gout, unspecified: Secondary | ICD-10-CM | POA: Diagnosis present

## 2010-03-20 DIAGNOSIS — Z905 Acquired absence of kidney: Secondary | ICD-10-CM

## 2010-03-20 DIAGNOSIS — N186 End stage renal disease: Secondary | ICD-10-CM | POA: Diagnosis present

## 2010-03-20 DIAGNOSIS — R627 Adult failure to thrive: Secondary | ICD-10-CM | POA: Diagnosis present

## 2010-03-20 DIAGNOSIS — Z7982 Long term (current) use of aspirin: Secondary | ICD-10-CM

## 2010-03-20 DIAGNOSIS — N400 Enlarged prostate without lower urinary tract symptoms: Secondary | ICD-10-CM | POA: Diagnosis present

## 2010-03-20 DIAGNOSIS — I120 Hypertensive chronic kidney disease with stage 5 chronic kidney disease or end stage renal disease: Principal | ICD-10-CM | POA: Diagnosis present

## 2010-03-20 DIAGNOSIS — D631 Anemia in chronic kidney disease: Secondary | ICD-10-CM | POA: Diagnosis present

## 2010-03-20 DIAGNOSIS — E785 Hyperlipidemia, unspecified: Secondary | ICD-10-CM | POA: Diagnosis present

## 2010-03-20 DIAGNOSIS — R0902 Hypoxemia: Secondary | ICD-10-CM | POA: Diagnosis present

## 2010-03-20 DIAGNOSIS — Z85528 Personal history of other malignant neoplasm of kidney: Secondary | ICD-10-CM

## 2010-03-20 LAB — DIFFERENTIAL
Basophils Absolute: 0.1 10*3/uL (ref 0.0–0.1)
Basophils Relative: 1 % (ref 0–1)
Eosinophils Relative: 3 % (ref 0–5)
Monocytes Absolute: 0.9 10*3/uL (ref 0.1–1.0)
Monocytes Relative: 12 % (ref 3–12)
Neutro Abs: 4.5 10*3/uL (ref 1.7–7.7)

## 2010-03-20 LAB — COMPREHENSIVE METABOLIC PANEL
AST: 21 U/L (ref 0–37)
CO2: 24 mEq/L (ref 19–32)
Calcium: 8.7 mg/dL (ref 8.4–10.5)
Creatinine, Ser: 28.23 mg/dL — ABNORMAL HIGH (ref 0.4–1.5)
GFR calc Af Amer: 2 mL/min — ABNORMAL LOW (ref >60)
GFR calc non Af Amer: 2 mL/min — ABNORMAL LOW (ref >60)
Glucose, Bld: 142 mg/dL — ABNORMAL HIGH (ref 70–99)
Total Protein: 7.9 g/dL (ref 6.0–8.3)

## 2010-03-20 LAB — URINE MICROSCOPIC-ADD ON

## 2010-03-20 LAB — URINALYSIS, ROUTINE W REFLEX MICROSCOPIC
Nitrite: POSITIVE — AB
Specific Gravity, Urine: 1.008 (ref 1.005–1.030)
Urine Glucose, Fasting: 100 mg/dL — AB
pH: 7 (ref 5.0–8.0)

## 2010-03-20 LAB — CBC
Hemoglobin: 11 g/dL — ABNORMAL LOW (ref 13.0–17.0)
MCH: 27.8 pg (ref 26.0–34.0)
MCHC: 33.2 g/dL (ref 30.0–36.0)
RDW: 14.4 % (ref 11.5–15.5)

## 2010-03-21 ENCOUNTER — Emergency Department (HOSPITAL_COMMUNITY): Payer: Medicare Other

## 2010-03-21 ENCOUNTER — Inpatient Hospital Stay (HOSPITAL_COMMUNITY): Payer: Medicare Other

## 2010-03-21 ENCOUNTER — Encounter: Payer: Self-pay | Admitting: Family Medicine

## 2010-03-21 LAB — HIV ANTIBODY (ROUTINE TESTING W REFLEX): HIV: NONREACTIVE

## 2010-03-21 LAB — HEPATITIS C ANTIBODY (REFLEX): HCV Ab: NEGATIVE

## 2010-03-21 LAB — POCT I-STAT 3, ART BLOOD GAS (G3+)
Bicarbonate: 26 mEq/L — ABNORMAL HIGH (ref 20.0–24.0)
O2 Saturation: 92 %
TCO2: 27 mmol/L (ref 0–100)
pCO2 arterial: 36 mmHg (ref 35.0–45.0)
pO2, Arterial: 59 mmHg — ABNORMAL LOW (ref 80.0–100.0)

## 2010-03-21 LAB — RENAL FUNCTION PANEL
Albumin: 1.9 g/dL — ABNORMAL LOW (ref 3.5–5.2)
CO2: 27 mEq/L (ref 19–32)
Glucose, Bld: 110 mg/dL — ABNORMAL HIGH (ref 70–99)
Glucose, Bld: 89 mg/dL (ref 70–99)
Phosphorus: 9 mg/dL — ABNORMAL HIGH (ref 2.3–4.6)
Potassium: 4.7 mEq/L (ref 3.5–5.1)
Potassium: 4.9 mEq/L (ref 3.5–5.1)
Sodium: 129 mEq/L — ABNORMAL LOW (ref 135–145)
Sodium: 127 mEq/L — ABNORMAL LOW (ref 135–145)

## 2010-03-21 LAB — HEPATITIS B SURFACE ANTIGEN
Hepatitis B Surface Ag: NEGATIVE
Hepatitis B Surface Ag: NEGATIVE

## 2010-03-21 LAB — CBC
HCT: 27.9 % — ABNORMAL LOW (ref 39.0–52.0)
Hemoglobin: 9.4 g/dL — ABNORMAL LOW (ref 13.0–17.0)
RBC: 3.37 MIL/uL — ABNORMAL LOW (ref 4.22–5.81)
RDW: 14.4 % (ref 11.5–15.5)
WBC: 6.4 10*3/uL (ref 4.0–10.5)

## 2010-03-21 LAB — CK TOTAL AND CKMB (NOT AT ARMC)
CK, MB: 1.4 ng/mL (ref 0.3–4.0)
CK, MB: 1.6 ng/mL (ref 0.3–4.0)
Relative Index: 0.7 (ref 0.0–2.5)
Total CK: 202 U/L (ref 7–232)
Total CK: 210 U/L (ref 7–232)

## 2010-03-21 LAB — HEPATITIS B SURFACE ANTIBODY,QUALITATIVE: Hep B S Ab: POSITIVE — AB

## 2010-03-21 LAB — BODY FLUID CELL COUNT WITH DIFFERENTIAL
Lymphs, Fluid: 0 %
Other Cells, Fluid: 0 %

## 2010-03-22 LAB — URINE CULTURE: Culture: NO GROWTH

## 2010-03-22 LAB — CBC
HCT: 30.3 % — ABNORMAL LOW (ref 39.0–52.0)
Hemoglobin: 9.8 g/dL — ABNORMAL LOW (ref 13.0–17.0)
RDW: 14.3 % (ref 11.5–15.5)
WBC: 6.1 10*3/uL (ref 4.0–10.5)

## 2010-03-22 LAB — RENAL FUNCTION PANEL
GFR calc non Af Amer: 2 mL/min — ABNORMAL LOW (ref >60)
Glucose, Bld: 128 mg/dL — ABNORMAL HIGH (ref 70–99)
Phosphorus: 9.2 mg/dL (ref 2.3–4.6)
Potassium: 4.2 mEq/L (ref 3.5–5.1)
Sodium: 128 mEq/L — ABNORMAL LOW (ref 135–145)

## 2010-03-23 LAB — CBC
HCT: 30.8 % — ABNORMAL LOW (ref 39.0–52.0)
MCH: 27.4 pg (ref 26.0–34.0)
MCHC: 32.8 g/dL (ref 30.0–36.0)
MCV: 83.5 fL (ref 78.0–100.0)
RDW: 14.1 % (ref 11.5–15.5)

## 2010-03-23 LAB — PHOSPHORUS: Phosphorus: 8.3 mg/dL — ABNORMAL HIGH (ref 2.3–4.6)

## 2010-03-23 LAB — COMPREHENSIVE METABOLIC PANEL
BUN: 64 mg/dL — ABNORMAL HIGH (ref 6–23)
CO2: 26 mEq/L (ref 19–32)
Calcium: 8.2 mg/dL — ABNORMAL LOW (ref 8.4–10.5)
Creatinine, Ser: 23.05 mg/dL — ABNORMAL HIGH (ref 0.4–1.5)
GFR calc non Af Amer: 2 mL/min — ABNORMAL LOW (ref >60)
Glucose, Bld: 184 mg/dL — ABNORMAL HIGH (ref 70–99)
Total Protein: 6.7 g/dL (ref 6.0–8.3)

## 2010-03-24 LAB — RENAL FUNCTION PANEL
Albumin: 1.7 g/dL — ABNORMAL LOW (ref 3.5–5.2)
BUN: 65 mg/dL — ABNORMAL HIGH (ref 6–23)
Calcium: 8.1 mg/dL — ABNORMAL LOW (ref 8.4–10.5)
Phosphorus: 7.6 mg/dL — ABNORMAL HIGH (ref 2.3–4.6)
Potassium: 4.3 mEq/L (ref 3.5–5.1)
Sodium: 134 mEq/L — ABNORMAL LOW (ref 135–145)

## 2010-03-24 LAB — MAGNESIUM: Magnesium: 1.7 mg/dL (ref 1.5–2.5)

## 2010-03-24 LAB — CBC
MCH: 27.7 pg (ref 26.0–34.0)
MCV: 83.3 fL (ref 78.0–100.0)
Platelets: 266 10*3/uL (ref 150–400)
RBC: 3.36 MIL/uL — ABNORMAL LOW (ref 4.22–5.81)
RDW: 13.9 % (ref 11.5–15.5)
WBC: 9.1 10*3/uL (ref 4.0–10.5)

## 2010-03-24 LAB — CLOSTRIDIUM DIFFICILE BY PCR: Toxigenic C. Difficile by PCR: NEGATIVE

## 2010-03-24 LAB — BODY FLUID CULTURE

## 2010-03-27 LAB — CULTURE, BLOOD (ROUTINE X 2)
Culture  Setup Time: 201202251154
Culture: NO GROWTH

## 2010-03-27 NOTE — Discharge Summary (Signed)
NAME:  Roger Gibson, Roger Gibson NO.:  [id number]  MEDICAL RECORD NO.:  [id number]           PATIENT TYPE:  I  LOCATION:  6736                         FACILITY:  [facility]  PHYSICIAN:  Zetta Bills, MD          DATE OF BIRTH:  10/28/1945  DATE OF ADMISSION:  03/20/2010 DATE OF DISCHARGE:  03/24/2010                              DISCHARGE SUMMARY   DISCHARGE DIAGNOSES: 1. Altered mental status secondary to under dialysis (the patient not     following peritoneal dialysis prescription). 2. Failure to thrive and weakness with chronic hematuria. 3. Allograft site pain. 4. End-stage renal disease, on peritoneal dialysis, status post     nephrectomy for renal cell carcinoma. 5. 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 6. Hypertension. 7. Fevers/pneumonia. 8. Gout. 9. Dyslipidemia. 10.Benign prostatic hypertrophy. 11.Secondary hyperparathyroidism. 12.Anemia of end-stage renal disease. 13.History of renal cell carcinoma status post partial nephrectomy of     the left and total nephrectomy of the right kidney.  MEDICATIONS UPON DISCHARGE: 1. Zosyn 2.25 g q.8 h. 2. Sensipar 30 mg p.o. daily. 3. Decadron 4 mg p.o. b.i.d. 4. Furosemide 40 mg p.o. b.i.d. 5. Amlodipine 10 mg p.o. at bedtime. 6. Lanthanum 1000 mg p.o. t.i.d. a.c. 7. Nepro protein shake 1 can p.o. b.i.d. 8. Protonix 40 mg p.o. daily. 9. Crestor 10 mg p.o. at bedtime. 10.Flomax 0.4 mg p.o. at bedtime. 11.Tylenol 650 mg q.6 h p.r.n.  ALLERGIES:  Unknown reaction to IODINE.  DISPOSITION:  The patient is to be transferred to [facility] for further evaluation and management of his failed allograft, now with allograft site pain and chronic hematuria leading to aversion from PD, failure to thrive, and anemia.  He is to be transferred to the service of Dr. Carrolyn Leigh of Kidney  Transplant Division.  Discharge was facilitated by Dr. Vella Raring of Nephrology.  HOSPITAL PROGRESS:  See complete details in admission history and physical for the presentation.  In brief, Roger Gibson is a 65 year old man with ESRD status post failed allograft transplant, now on peritoneal dialysis.  He presented with 2 weeks of weakness, failure to thrive, and hematuria after being recommended for admission to [facility], however, failing to find a bed there.  He also spent some time at the [facility] Emergency Room, however, was frustrated by the waiting period and came over to [facility] for admission.  In the hospital, 1. Altered mental status.  Given the labs on presentation as well as     his admission regarding his aversion to dialysis due to his     abdominal pain, a suspicion was made for uremia - under dialysis as     to be a contributory factor for his altered mental status.     Following the resumption of CAPD in the hospital and its consequent     clearance, Roger Gibson started having an improvement in his mental     status and within 19  hours was at his baseline mental status. 2. Fever/pneumonia.  Upon admission, Roger Gibson' vital signs indicated     a fever of 100.9 degrees Fahrenheit coupled with tachycardia.     Radiological imaging was suggestive of possible left lower lobe     airspace disease.  He was empirically started on broad-spectrum     antibiotic coverage with Zosyn with an improvement of his fever,     becoming afebrile within 48 hours.  He did not have a leukocytosis     at any point. 3. Hematuria/anemia/allograft failure.  We periodically monitored Mr.     Gibson' hemoglobin trend and he was restarted back on     erythropoietin.  He received a single dose of subcutaneous     erythropoietin while in the hospital.  Given his allograft site     tenderness as well as concerning hematuria, discussions were     initiated with [facility]     for possible transfer and allograft nephrectomy given his     symptomatology.  Given the further complicating fact that he is on     peritoneal dialysis, it was best felt for Roger Gibson to be managed     at a tertiary care facility where he was more known to the     surgeons. 4. Secondary hyperparathyroidism.  Restarted on Lanthanum for binder     purposes and continued on Sensipar for suppression of PTH. 5. Dyslipidemia.  He was continued on Crestor throughout the     hospitalization and will be discharged on the same. 6. Hypertension.  Blood pressure trends improved with more efficient     dialysis while on amlodipine and furosemide therapy as well.  DISCHARGE LABORATORY DATA:  Roger Gibson' discharge hemoglobin is 9.3 gm/dL, hematocrit of 28, white cell count 9100, and platelet count 266,000.  Sodium 134, potassium 4.3, bicarbonate 28, chloride 91, BUN 65, creatinine 20, glucose 160, albumin 1.7, calcium 8.1, phosphorus 7.6, magnesium 1.7.  Peritoneal fluid cultures negative.  Blood cultures two sets both negative.  Urine culture negative.  PD fluid cell count 330.     Zetta Bills, MD     JP/MEDQ  D:  03/24/2010  T:  03/24/2010  Job:  [id number]  cc:   Terrial Rhodes, M.D.  Electronically Signed by Zetta Bills MD on 03/27/2010 12:47:29 PM

## 2010-04-15 NOTE — H&P (Signed)
NAME:  Roger Gibson, Roger Gibson NO.:  [id number]  MEDICAL RECORD NO.:  [id number]           PATIENT TYPE:  I  LOCATION:  6736                         FACILITY:  [facility]  PHYSICIAN:  Terrial Rhodes, M.D.DATE OF BIRTH:  04/23/1945  DATE OF ADMISSION:  03/20/2010 DATE OF DISCHARGE:                             HISTORY & PHYSICAL   CHIEF COMPLAINT:  Weakness, hematuria, failure to thrive.  HISTORY OF PRESENT ILLNESS:  Roger Gibson is a 65 year old African American male with past medical history significant for end-stage renal disease secondary to bilateral nephrectomies.  He had started on dialysis back in 2003 and was receiving peritoneal dialysis until he got a cadaveric kidney transplant at [facility] in 2005.  According to his wife, he has had complications and has had a failing graft, also the history of BK polyoma virus infection and was started on peritoneal dialysis on March 07, 2009 and has been followed by Roger Gibson at [facility] in [place].  Over the last several weeks, he has been having increasing weakness, fatigue, malaise, abdominal pain, and also has had hematuria for the last 2 weeks and was started on antibiotics for presumed UTI.  He had been referred to [facility] to see Roger Gibson for possible transplant nephrectomy as it was felt that transplant rejection was the cause of most of his symptoms.  He was seen earlier today there and was noted to be uremic and stated there was no beds available at [facility], and so the patient and his wife were instructed to either go to [facility] or to [facility].  He did not go to [facility] because he is in the process of changing positions as he was not pleased with Roger Gibson care.  After that, he stopped his Prograf and prednisone and felt that it was his fault that his transplant kidney was now rejecting.  He also had seen Roger Gibson in our office several years ago, but wanted to be referred to  [facility] for unclear reasons.  Of note, he has had some weakness and fatigue for several years.  He also has been complaining of anorexia, occasional nausea and vomiting.  His wife reports altered mental status for last 2 days and was just recently aware that he has not been doing peritoneal dialysis for last week because he was too weak and tired.  ALLERGIES:  He has no known drug allergies.  PAST MEDICAL HISTORY: 1. End-stage renal disease following a nephrectomy while he was in the     [employer], on the right and a partial nephrectomy on the left due to     renal cell carcinoma.  Started on peritoneal dialysis in 2002.     a.     Status post cadaveric kidney transplant in 2005 at [facility]      complicated by rejection and BK polyoma virus and resume      peritoneal dialysis in February 2011 followed by Roger Gibson 2. Hypertension. 3. Gout. 4. Hypercholesterolemia. 5. BPH. 6. Secondary hyperthyroidism. 7. Anemia. 8. History of renal cell carcinoma status post partial nephrectomy of     his left  kidney and status post right nephrectomy while in the [employer]     for unclear reasons day.  OUTPATIENT MEDICATIONS: 1. Amlodipine 10 mg a day. 2. Lasix 40 mg 4 times a day. 3. Zocor 20 mg a day. 4. Metolazone 2.5 mg daily. 5. Hectorol 0.5 mg daily. 6. Sensipar 30 mg daily. 7. Aspirin 81 mg daily. 8. Omeprazole 20 mg daily. 9. Flomax 0.4 mg daily.  On an outpatient note from Roger Gibson note, he was also taking Viagra 100 mg p.r.n. and Toprol XL 100 mg a day.  FAMILY HISTORY:  His mother died of unknown age from cancer and does not remember what.  Father died in his 43s from kidney disease.  He had 1 sister who died of kidney disease.  He has 3 brothers and 2 sisters living.  SOCIAL HISTORY:  He is married for the second time for 16 years.  His 3 children are alive and well.  No tobacco, occasional alcohol.  No IV drug use.  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  He  is originally from [place] and moved back here.  REVIEW OF SYSTEMS:  GENERAL:  He has had anorexia, malaise, fatigue, weakness, and weight loss.  HEENT: Denies any tinnitus, headaches, epistaxis.  PULMONARY:  Denies any shortness of breath, hemoptysis, productive cough.  GI:  Has had some nausea and vomiting intermittently as well as anorexia.  No hematochezia, melena, or bright red blood per rectum.  GU:  He has had some dysuria and hematuria for the last 2 weeks and was on antibiotics.  He also has pain over his transplanted kidney in the right lower quadrant that has been going on for the last several months.  MUSCULOSKELETAL:  He has had some weakness and had a gout flare last week.  DERMATOLOGIC:  No rashes, lumps, or bumps.  HEMATOLOGIC:  No abnormal bleeding or bruising.  NEUROLOGIC:  Has had weakness, fatigue and some altered mental status for the last 2 days per his wife.  All other systems negative.  PHYSICAL EXAMINATION:  GENERAL:  He is a well-developed, well-nourished man in mild distress. VITAL SIGNS:  Temperature 100.3, pulse 98, blood pressure 137/88, respiratory rate is 18, pulse ox was 96% on room air. HEENT:  Head is normocephalic, atraumatic.  Extraocular muscles intact. No icterus.  Oropharynx without lesions. NECK:  Supple.  No lymphadenopathy or bruits. LUNGS:  Had decreased breath sounds at the bases.  No dullness to percussion. CARDIAC:  Regular rate and rhythm.  No precordial rub appreciated. ABDOMINAL:  Normoactive bowel sounds, soft.  He had mild tenderness to his right lower quadrant over his transplanted kidney.  No guarding or rebound.  His peritoneal dialysis catheter is present in the right periumbilical area.  No erythema or discharge from the exit site. EXTREMITIES:  No clubbing, cyanosis, or edema. NEUROLOGIC:  He was lethargic, but arousable and verbal, was oriented only to person.  He thought he was in [place] at [facility] in the year 1994  and Roger Gibson was president.  He also had asterixis on exam.  LABORATORY DATA:  His urinalysis showed large blood, positive ketones, positive bilirubin, 3-6 white cells, too numerous to count red cells. Sodium 126, potassium 5, chloride 81, CO2 24, BUN 87, creatinine 25.23, glucose of 142, calcium 8.7, albumin 2.3, AST 21, ALT 14.  White blood cell count 6.9, hemoglobin 11, platelets 335.  ASSESSMENT AND PLAN: 1. Fevers, possibly related to rejection or urinary tract infection.  However, his saturations also dropped down to 89% and may have an     early pneumonia.  We will panculture, check a he chest x-ray PA and     lateral and start him on empiric Zosyn.  After these are done, we     will also check PD fluid for cell count diff, Gram stain and     culture, although he is nontender. 2. Altered mental status, likely uremia since he has not had dialysis     over a week and this is also supported by his elevated BUN and     creatinine.  We will also check an ABG. 3. End-stage renal disease status post failed kidney transplant.  We     will start him on CCPD tonight.  The patient does not know his     regimen.  His wife does not know his regimen and as above, he has     not had peritoneal dialysis in a week.  We used a cycler with a     daytime dwell, fill volumes of 2 liters. 4. Hyponatremia, likely due to uremia.  We will follow after PD is     initiated. 5. Hematuria for 2 weeks, questionable urinary tract infection versus     rejection.  We will check an ultrasound of his transplanted kidney     and continue to follow. 6. Hypertension.  Continue with meds. 7. Hypoxia as above.  We will check an ABG.  We will admit him to an     inpatient 6700 and try to obtain records in the morning from [facility] and consider transfer there for transplant nephrectomy     once he is stabilized, although his wife reports that he was seen     there earlier today and no beds were  available.          ______________________________ Terrial Rhodes, M.D.     JC/MEDQ  D:  03/21/2010  T:  03/21/2010  Job:  [id number]  Electronically Signed by Terrial Rhodes M.D. on 04/15/2010 02:00:57 PM

## 2010-05-11 ENCOUNTER — Telehealth: Payer: Self-pay | Admitting: Family Medicine

## 2010-05-11 NOTE — Telephone Encounter (Signed)
Call from Dr. Ezzard Standing with [facility].  Pt was recently inpatient and is now on HD T/Th/Sat.  Ezzard Standing called to let me know that patient was recently discharged and now had BLE edema.  He has HD planned for tomorrow.  I d/w her: if CP/SOB/profound BLE edema, then patient needed to be seen in ER.  She agreed and she is going to call the patient about this.  I specifically asked her to call and she agreed.  If patient doesn't need eval tonight, proceed with HD tomorrow per renal service.  In meantime, I'll ask staff to call patient about getting fu appointment with me her in about 1 week for routine discharge follow up.  Ezzard Standing agreed with the plan.

## 2010-05-13 ENCOUNTER — Encounter: Payer: Self-pay | Admitting: *Deleted

## 2010-05-13 NOTE — Telephone Encounter (Signed)
I have made several attempts at phoning the patient.  Apparently our contact number is incorrect.  Letter mailed.

## 2010-05-19 ENCOUNTER — Ambulatory Visit (INDEPENDENT_AMBULATORY_CARE_PROVIDER_SITE_OTHER): Payer: 59 | Admitting: Family Medicine

## 2010-05-19 ENCOUNTER — Encounter: Payer: Self-pay | Admitting: Family Medicine

## 2010-05-19 DIAGNOSIS — R410 Disorientation, unspecified: Secondary | ICD-10-CM | POA: Insufficient documentation

## 2010-05-19 DIAGNOSIS — N186 End stage renal disease: Secondary | ICD-10-CM

## 2010-05-19 DIAGNOSIS — F29 Unspecified psychosis not due to a substance or known physiological condition: Secondary | ICD-10-CM

## 2010-05-19 NOTE — Patient Instructions (Signed)
I called the HD site and they know you are coming.  Let me know if the confusion increases.  I'll get your records from [facility] in the meantime.

## 2010-05-19 NOTE — Progress Notes (Signed)
Off PD and back on HD now per renal.  Now s/p nephrectomy complicated by bleeding.  04/11/10 discharged, back in with abdominal pain, AMS 3/20.  Discharged again 4/14.  I don't have records to review yet.  We have requested them.   Last 2 days with fatigue, dry cough, some nausea.  Wife thought he was sob last night.  He used wife's O2 last night.  Tmax 99.2 last night, no temp known 100.4 or greater.  Is due for HD today.  T, Th, Sat HD in [place].    I called renal service.  See plan.  Meds, vitals, and allergies reviewed.   ROS: See HPI.  Otherwise, noncontributory.  nad Oriented to year and self, but not month ncat Mmm rrr ctab w/o crackles or wheeze, no inc in wob R chest with temp HD site in place, intact appearing abd soft 1+ edema ble edema, well perfused

## 2010-05-20 ENCOUNTER — Encounter: Payer: Self-pay | Admitting: Family Medicine

## 2010-05-20 ENCOUNTER — Telehealth: Payer: Self-pay | Admitting: *Deleted

## 2010-05-20 NOTE — Telephone Encounter (Signed)
Physical therapist went to see pt today and reports that pt still has a lot of fluid build up in lower extremities, he coughs when he is supine- so therapist thinks he may have fluid in his lungs.  He went to dialysis yesterday, but that didn't help get rid of any fluid.  Please advise.

## 2010-05-20 NOTE — Assessment & Plan Note (Signed)
I called and talked to Valerie Salts, NP with [facility].  He is aware that patient is leaving clinic to come to HD and is aware of recent events.  Pt's confusion is at baseline per wife.  We are requesting records.  He should proceed to HD clinic for eval/tx. He doesn't appear toxic.  However, should he get sob in the future, I don't want the wife to put O2 on him and avoid medical eval/tx.  This was d/w them and they understood. He may just need extra fluid off to help with the presumed noctural dyspnea allegedly noted last night.  I will await renal input.

## 2010-05-20 NOTE — Telephone Encounter (Signed)
Pt's wife called and I advised her of directing this to pt's nephrologist.  She asked that I call PT and tell him.  I called Caryn Bee and left a message on his voice mail.  I also asked him to call me to let me know that he got the message.

## 2010-05-20 NOTE — Telephone Encounter (Signed)
Noted  

## 2010-05-20 NOTE — Telephone Encounter (Signed)
Please call PT back.  This question needs to be directed to his Renal MD.  I will await their input. Thanks.

## 2010-05-20 NOTE — Assessment & Plan Note (Signed)
See above. >25 min spent with face to face with patient counseling and coordinating care.

## 2010-05-20 NOTE — Telephone Encounter (Signed)
Since Dr. Para March saw the patient yesterday, and he is here, I am going to ask for his input in this case.

## 2010-06-04 ENCOUNTER — Telehealth: Payer: Self-pay | Admitting: *Deleted

## 2010-06-04 ENCOUNTER — Encounter: Payer: Self-pay | Admitting: Family Medicine

## 2010-06-04 ENCOUNTER — Ambulatory Visit (INDEPENDENT_AMBULATORY_CARE_PROVIDER_SITE_OTHER): Payer: 59 | Admitting: Family Medicine

## 2010-06-04 DIAGNOSIS — R5383 Other fatigue: Secondary | ICD-10-CM

## 2010-06-04 DIAGNOSIS — R410 Disorientation, unspecified: Secondary | ICD-10-CM

## 2010-06-04 DIAGNOSIS — N186 End stage renal disease: Secondary | ICD-10-CM

## 2010-06-04 DIAGNOSIS — Z992 Dependence on renal dialysis: Secondary | ICD-10-CM

## 2010-06-04 DIAGNOSIS — R5381 Other malaise: Secondary | ICD-10-CM

## 2010-06-04 DIAGNOSIS — I10 Essential (primary) hypertension: Secondary | ICD-10-CM

## 2010-06-04 DIAGNOSIS — F29 Unspecified psychosis not due to a substance or known physiological condition: Secondary | ICD-10-CM

## 2010-06-04 MED ORDER — LABETALOL HCL 200 MG PO TABS: 200.0000 mg | ORAL_TABLET | Freq: Two times a day (BID) | ORAL | Status: DC

## 2010-06-04 NOTE — Patient Instructions (Signed)
Call the renal clinic about the renagel. I sent in your labetalol.   I would finish the zyprexa rx and then see how you feel after that.  Let us know if your agitation or confusion increases.   We'll try to get your records from [place].   I would use the spirometer to see if that helps the cough.

## 2010-06-04 NOTE — Assessment & Plan Note (Addendum)
Continue labetalol.  No change in meds today.  Edema improved.

## 2010-06-04 NOTE — Telephone Encounter (Signed)
Give verbal order to extend PT for 2 more weeks.

## 2010-06-04 NOTE — Assessment & Plan Note (Signed)
Per renal 

## 2010-06-04 NOTE — Assessment & Plan Note (Addendum)
Improved some, will see if this changes off zyprexa.  He's had multiple metabolic insults- ESRD/HD, surgery, along with psychotropic meds.  Requested records.

## 2010-06-04 NOTE — Assessment & Plan Note (Addendum)
Requested records again from [place].  I don't know how much is due to deconditioning and/or ESRD on HD.   This may be related to zyprexa and he's coming off that in the next few days.

## 2010-06-04 NOTE — Progress Notes (Signed)
ESRD per [facility].  Mentation is some better, noted by wife and patient.  Fluctuates, worse when tired.  Still on zyprexa for agitation.  Was started in the hospital.  No hallucinations.  HD T/Th/Sat.  Edema improved with HD.   Cough- worse supine.  Not worse overall, possibly some better.  Edema is better.  No fevers.  No sputum.  Not using IS at home.   Still with dec in appetite and some fatigue.    PMH and SH reviewed  ROS: See HPI, otherwise noncontributory.  Meds, vitals, and allergies reviewed.   nad but not oriented to year. Speech is improved from before and closer to prev baseline in terms of cadence and tone MMM, OP wnl rrr ctab HD site CDI PD site CDI Abd soft, not ttp Trace edema

## 2010-06-04 NOTE — Telephone Encounter (Signed)
Physical Therapist calling asking for an extension on written order to continue PT, original order came from [facility], pt had kidney transplant, and they are working with him on gait and abnormality. Pt has appt today at 3pm with Dr. Para March, can he extend pt's order? His last day for PT will be Friday, if so the order will be effective 06/06/2010, she will fax a written order to sign, if you can call her with a verbal 1st.

## 2010-06-04 NOTE — Telephone Encounter (Signed)
LMOVM giving verbal order.

## 2010-06-05 ENCOUNTER — Other Ambulatory Visit: Payer: Self-pay | Admitting: *Deleted

## 2010-06-05 NOTE — Telephone Encounter (Signed)
I would have this come through his renal clinic long term.  Please have them direct it to them.

## 2010-06-05 NOTE — Telephone Encounter (Signed)
Wife advised. 

## 2010-06-05 NOTE — Telephone Encounter (Signed)
Spouse called requesting a refill on Magnesium, she forgot to request the refill while with her husband yesterday at his appointment.  She stated that this Rx was originally given to him by a doctor at the hospital.  Please advise.

## 2010-06-10 ENCOUNTER — Telehealth: Payer: Self-pay | Admitting: *Deleted

## 2010-06-10 NOTE — Telephone Encounter (Signed)
Physical Therapist called last week for verbal orders to extend PT x 4 weeks. She says that when she got the call it was for 2 weeks. She says that she will go ahead with the order for 2 weeks, but is asking if she can get verbal order to extend it for an additional 2 weeks. She is aware that you are out of the office until next week and says that it is okay to wait.

## 2010-06-12 ENCOUNTER — Observation Stay: Payer: Self-pay | Admitting: Nephrology

## 2010-06-12 NOTE — Op Note (Signed)
NAME:  Roger Gibson, Roger Gibson                         ACCOUNT NO.:  [id number]   MEDICAL RECORD NO.:  [id number]                   PATIENT TYPE:  AMB   LOCATION:  ENDO                                 FACILITY[facility]  [facility]   PHYSICIAN:  Danise Edge, M.D.                DATE OF BIRTH:  04-22-1945   DATE OF PROCEDURE:  05/15/2003  DATE OF DISCHARGE:                                 OPERATIVE REPORT   PROCEDURE:  Screening colonoscopy with polypectomy.   INDICATIONS FOR PROCEDURE:  Mr. Sherri Levenhagen is a 65 year old male born  Dec 12, 1945.  Mr. Kaucher is scheduled to undergo his first screening  colonoscopy with polypectomy to prevent colon cancer.   ENDOSCOPIST:  Danise Edge, M.D.   PREMEDICATION:  Versed 5 mg, Demerol 50 mg.   DESCRIPTION OF PROCEDURE:  After obtaining informed consent, Mr. Chenier  was  placed in the left lateral decubitus position. I administered intravenous  Demerol and intravenous Versed to achieve conscious sedation for the  procedure. The patient's blood pressure, oxygen saturation and cardiac  rhythm were monitored throughout the procedure and documented in the medical  record.   Anal inspection and digital rectal exam were normal.  An incomplete exam of  the prostate reveals no nodules.  The Olympus adjustable pediatric  colonoscope was introduced into the rectum and advanced to the cecum. The  ileocecal valve was intubated and the distal ileum inspected.  Colonic  preparation for the exam today was excellent.   RECTUM:  Normal.   SIGMOID COLON AND DESCENDING COLON:  Normal.   SPLENIC FLEXURE:  Normal.   TRANSVERSE COLON:  From the mid transverse colon, a 1 mm sessile polyp was  removed with the cold biopsy forceps.   HEPATIC FLEXURE:  Normal.   ASCENDING COLON:  Normal.   CECUM AND ILEOCECAL VALVE:  Normal.   DISTAL ILEUM:  Normal.   ASSESSMENT:  A diminutive polyp was removed from the mid transverse colon;  otherwise normal screening  proctocolonoscopy to the cecum.  No endoscopic  evidence for the presence of colorectal cancer.                                               Danise Edge, M.D.    MJ/MEDQ  D:  05/15/2003  T:  05/15/2003  Job:  [id number]   cc:   Ike Bene, M.D.  [address]  [place]  [place] [address]  Fax: [phone]

## 2010-06-13 ENCOUNTER — Encounter: Payer: Self-pay | Admitting: Family Medicine

## 2010-06-13 DIAGNOSIS — C649 Malignant neoplasm of unspecified kidney, except renal pelvis: Secondary | ICD-10-CM | POA: Insufficient documentation

## 2010-06-13 NOTE — Telephone Encounter (Signed)
Please give order for another 2 weeks, for a total of 4 weeks. Thanks.

## 2010-06-15 NOTE — Telephone Encounter (Signed)
Roger Gibson with Roger Gibson -PT advised by VM.

## 2010-06-16 ENCOUNTER — Encounter: Payer: Self-pay | Admitting: Family Medicine

## 2010-06-17 ENCOUNTER — Encounter: Payer: Self-pay | Admitting: Family Medicine

## 2010-06-29 ENCOUNTER — Telehealth: Payer: Self-pay | Admitting: *Deleted

## 2010-06-29 DIAGNOSIS — R262 Difficulty in walking, not elsewhere classified: Secondary | ICD-10-CM

## 2010-06-29 DIAGNOSIS — C8583 Other specified types of non-Hodgkin lymphoma, intra-abdominal lymph nodes: Secondary | ICD-10-CM

## 2010-06-29 DIAGNOSIS — IMO0001 Reserved for inherently not codable concepts without codable children: Secondary | ICD-10-CM

## 2010-06-29 DIAGNOSIS — T8610 Unspecified complication of kidney transplant: Secondary | ICD-10-CM

## 2010-06-29 NOTE — Telephone Encounter (Signed)
Please print the med list and send in

## 2010-06-29 NOTE — Telephone Encounter (Addendum)
Called patient to update medication list per Dr. Para March

## 2010-06-30 NOTE — Telephone Encounter (Signed)
Medication list to be printed and attached to Home Health Certification and Plan of Care Form sent by Genevieve Norlander.

## 2010-07-01 ENCOUNTER — Telehealth: Payer: Self-pay | Admitting: *Deleted

## 2010-07-01 NOTE — Telephone Encounter (Signed)
Medication updated per Dr. Para March

## 2010-07-11 ENCOUNTER — Inpatient Hospital Stay: Payer: Self-pay | Admitting: Internal Medicine

## 2010-07-13 ENCOUNTER — Other Ambulatory Visit: Payer: Self-pay | Admitting: *Deleted

## 2010-07-13 MED ORDER — DOXERCALCIFEROL 0.5 MCG PO CAPS: 0.5000 ug | ORAL_CAPSULE | Freq: Every day | ORAL | Status: DC

## 2010-07-14 ENCOUNTER — Telehealth: Payer: Self-pay | Admitting: *Deleted

## 2010-07-14 ENCOUNTER — Encounter: Payer: Self-pay | Admitting: Family Medicine

## 2010-07-14 NOTE — Telephone Encounter (Signed)
Prior Berkley Harvey is needed for hectorol, form is on your desk.

## 2010-07-14 NOTE — Telephone Encounter (Signed)
Signed. Thanks.

## 2010-07-15 NOTE — Telephone Encounter (Signed)
Prior auth given, approval letter placed on doctor's desk for signature and scanning.

## 2010-07-15 NOTE — Telephone Encounter (Signed)
Form faxed to [employer].

## 2010-07-20 ENCOUNTER — Encounter: Payer: Self-pay | Admitting: Family Medicine

## 2010-07-20 DIAGNOSIS — Z22322 Carrier or suspected carrier of Methicillin resistant Staphylococcus aureus: Secondary | ICD-10-CM | POA: Insufficient documentation

## 2010-08-14 ENCOUNTER — Ambulatory Visit (INDEPENDENT_AMBULATORY_CARE_PROVIDER_SITE_OTHER): Payer: 59 | Admitting: Family Medicine

## 2010-08-14 ENCOUNTER — Encounter: Payer: Self-pay | Admitting: Family Medicine

## 2010-08-14 DIAGNOSIS — M79609 Pain in unspecified limb: Secondary | ICD-10-CM

## 2010-08-14 DIAGNOSIS — M79606 Pain in leg, unspecified: Secondary | ICD-10-CM

## 2010-08-14 NOTE — Patient Instructions (Signed)
Stop the simvastatin and call the clinic with an update next week.  Take care.  Glad to see you.

## 2010-08-14 NOTE — Progress Notes (Signed)
Pain in legs. B calf pain.  No trauma.  1-2 weeks.  Better sitting.  Pain walking- with initiation.  Doesn't get better with walking.  No trauma.  On statin  Meds, vitals, and allergies reviewed.   ROS: See HPI.  Otherwise, noncontributory.  Nad, oriented, cognition and memory appear intact ncat ttt ctab abd soft, not ttp HD cath site bandaged, PD site w/o erythema B calf 36cm, no discrepancy, no erythema, calf not ttp x2 Distally nv intact

## 2010-08-16 DIAGNOSIS — M79606 Pain in leg, unspecified: Secondary | ICD-10-CM | POA: Insufficient documentation

## 2010-08-16 NOTE — Assessment & Plan Note (Signed)
Likely related to statin.  Hold med and call back with update.  He agrees.  D/w pt and wife.  No other cause is obvious.

## 2010-09-07 ENCOUNTER — Other Ambulatory Visit: Payer: Self-pay | Admitting: *Deleted

## 2010-09-07 MED ORDER — AMLODIPINE BESYLATE 10 MG PO TABS: 10.0000 mg | ORAL_TABLET | Freq: Every day | ORAL | Status: DC

## 2010-09-15 ENCOUNTER — Other Ambulatory Visit: Payer: Self-pay | Admitting: *Deleted

## 2010-09-15 NOTE — Telephone Encounter (Signed)
Pt is asking for new 90 day written scripts that he can send in for new mail order.  Please mail these to his home address when ready.

## 2010-09-15 NOTE — Telephone Encounter (Signed)
Please print and then send to pt. Thanks.

## 2010-09-16 MED ORDER — OMEPRAZOLE 20 MG PO CPDR: 20.0000 mg | DELAYED_RELEASE_CAPSULE | ORAL | Status: DC

## 2010-09-16 MED ORDER — MAGNESIUM OXIDE 400 MG PO TABS: 400.0000 mg | ORAL_TABLET | Freq: Two times a day (BID) | ORAL | Status: DC

## 2010-09-16 MED ORDER — DOXERCALCIFEROL 0.5 MCG PO CAPS: 0.5000 ug | ORAL_CAPSULE | Freq: Every day | ORAL | Status: DC

## 2010-09-16 MED ORDER — AMLODIPINE BESYLATE 10 MG PO TABS: 10.0000 mg | ORAL_TABLET | Freq: Every day | ORAL | Status: DC

## 2010-09-16 MED ORDER — LABETALOL HCL 200 MG PO TABS: 200.0000 mg | ORAL_TABLET | Freq: Two times a day (BID) | ORAL | Status: DC

## 2010-09-16 NOTE — Telephone Encounter (Signed)
Rx's mailed.

## 2010-09-22 ENCOUNTER — Other Ambulatory Visit: Payer: Self-pay | Admitting: Internal Medicine

## 2010-09-22 DIAGNOSIS — C649 Malignant neoplasm of unspecified kidney, except renal pelvis: Secondary | ICD-10-CM

## 2010-09-25 ENCOUNTER — Ambulatory Visit
Admission: RE | Admit: 2010-09-25 | Discharge: 2010-09-25 | Disposition: A | Discharge status: Home / Self Care | Payer: Medicare Other | Source: Ambulatory Visit | Attending: Internal Medicine | Admitting: Internal Medicine

## 2010-09-25 DIAGNOSIS — C649 Malignant neoplasm of unspecified kidney, except renal pelvis: Secondary | ICD-10-CM

## 2010-10-28 ENCOUNTER — Ambulatory Visit: Payer: Self-pay | Admitting: Nephrology

## 2010-11-10 ENCOUNTER — Inpatient Hospital Stay: Payer: Self-pay | Admitting: Internal Medicine

## 2010-11-12 ENCOUNTER — Encounter: Payer: Self-pay | Admitting: Family Medicine

## 2010-11-22 ENCOUNTER — Encounter: Payer: Self-pay | Admitting: Family Medicine

## 2010-12-04 ENCOUNTER — Ambulatory Visit: Payer: Self-pay | Admitting: Specialist

## 2011-01-06 ENCOUNTER — Ambulatory Visit: Payer: Self-pay | Admitting: Vascular Surgery

## 2011-03-19 ENCOUNTER — Ambulatory Visit (INDEPENDENT_AMBULATORY_CARE_PROVIDER_SITE_OTHER): Payer: Medicare Other | Admitting: Family Medicine

## 2011-03-19 ENCOUNTER — Encounter: Payer: Self-pay | Admitting: Family Medicine

## 2011-03-19 VITALS — BP 144/76 | HR 59 | Temp 98.2°F | Wt 175.0 lb

## 2011-03-19 DIAGNOSIS — N529 Male erectile dysfunction, unspecified: Secondary | ICD-10-CM

## 2011-03-19 DIAGNOSIS — R410 Disorientation, unspecified: Secondary | ICD-10-CM

## 2011-03-19 DIAGNOSIS — F29 Unspecified psychosis not due to a substance or known physiological condition: Secondary | ICD-10-CM

## 2011-03-19 DIAGNOSIS — M79606 Pain in leg, unspecified: Secondary | ICD-10-CM

## 2011-03-19 DIAGNOSIS — M79609 Pain in unspecified limb: Secondary | ICD-10-CM

## 2011-03-19 MED ORDER — SILDENAFIL CITRATE 50 MG PO TABS: 25.0000 mg | ORAL_TABLET | ORAL | Status: DC | PRN

## 2011-03-19 NOTE — Patient Instructions (Signed)
Don't take the viagra every day.  Start with 25 mg.  Take care.

## 2011-03-19 NOTE — Progress Notes (Signed)
He fell on his knees 1 month ago.  He didn't pass out, he just fell.  He's still having some stiffness after sitting.   He hasn't iced his knees.  Not better with walking.  He had some history of confusion, but this is resolved.    Asking about viagra.  No AM erection.  No urine production.  I talked with pharmacy at [facility] and he can use viagra but not daily.  This was discussed.    Meds, vitals, and allergies reviewed.   ROS: See HPI.  Otherwise, noncontributory.  nad ncat Mmm rrr ctab Ext w/o edema Speech wnl No bruising or puffiness on the knees.  ACL/MCM/LCL intact on testing B

## 2011-03-21 NOTE — Assessment & Plan Note (Signed)
Benign exam, he'll ice and call back as needed, fu prn.

## 2011-03-21 NOTE — Assessment & Plan Note (Signed)
Start viagra, not daily.  Use 25mg  then 50mg  if no response. Usual cautions given to pt, understood.

## 2011-04-12 ENCOUNTER — Ambulatory Visit: Payer: Self-pay | Admitting: Vascular Surgery

## 2011-05-13 ENCOUNTER — Other Ambulatory Visit (HOSPITAL_COMMUNITY): Payer: Self-pay | Admitting: Nephrology

## 2011-05-13 DIAGNOSIS — N186 End stage renal disease: Secondary | ICD-10-CM

## 2011-05-14 ENCOUNTER — Other Ambulatory Visit: Payer: Self-pay | Admitting: Physician Assistant

## 2011-05-19 ENCOUNTER — Ambulatory Visit (HOSPITAL_COMMUNITY)
Admission: RE | Admit: 2011-05-19 | Discharge: 2011-05-19 | Disposition: A | Discharge status: Home / Self Care | Payer: Medicare HMO | Source: Ambulatory Visit | Attending: Nephrology | Admitting: Nephrology

## 2011-05-19 DIAGNOSIS — N186 End stage renal disease: Secondary | ICD-10-CM | POA: Insufficient documentation

## 2011-05-19 DIAGNOSIS — Z452 Encounter for adjustment and management of vascular access device: Secondary | ICD-10-CM | POA: Insufficient documentation

## 2011-05-19 MED ORDER — CHLORHEXIDINE GLUCONATE 4 % EX LIQD
CUTANEOUS | Status: AC
  Filled 2011-05-19: qty 30

## 2011-05-19 NOTE — Procedures (Signed)
Patient with left chest tunneled IJ catheter placed in [place] - using left arm access in HD and no longer needs catheter - request has been made for removal.  Procedure : using sterile technique - removal of Left chest IJ tunneled HD catheter Specimen :  Catheter in it's entirety Blood loss : minimal meds : 1% lidocaine  Complications : none immediate = patient tolerated well

## 2011-06-27 ENCOUNTER — Emergency Department (HOSPITAL_COMMUNITY)
Admission: EM | Admit: 2011-06-27 | Discharge: 2011-06-27 | Disposition: A | Discharge status: Home / Self Care | Payer: Medicare HMO | Attending: Emergency Medicine | Admitting: Emergency Medicine

## 2011-06-27 ENCOUNTER — Encounter (HOSPITAL_COMMUNITY): Payer: Self-pay | Admitting: *Deleted

## 2011-06-27 ENCOUNTER — Emergency Department (INDEPENDENT_AMBULATORY_CARE_PROVIDER_SITE_OTHER): Payer: Medicare Other

## 2011-06-27 ENCOUNTER — Encounter (HOSPITAL_COMMUNITY): Payer: Self-pay

## 2011-06-27 ENCOUNTER — Emergency Department (INDEPENDENT_AMBULATORY_CARE_PROVIDER_SITE_OTHER)
Admission: EM | Admit: 2011-06-27 | Discharge: 2011-06-27 | Disposition: A | Discharge status: Nursing Home (Includes ICF) | Payer: Medicare Other | Source: Home / Self Care

## 2011-06-27 DIAGNOSIS — R5383 Other fatigue: Secondary | ICD-10-CM

## 2011-06-27 DIAGNOSIS — R5381 Other malaise: Secondary | ICD-10-CM

## 2011-06-27 DIAGNOSIS — E785 Hyperlipidemia, unspecified: Secondary | ICD-10-CM | POA: Insufficient documentation

## 2011-06-27 DIAGNOSIS — I120 Hypertensive chronic kidney disease with stage 5 chronic kidney disease or end stage renal disease: Secondary | ICD-10-CM | POA: Insufficient documentation

## 2011-06-27 DIAGNOSIS — J189 Pneumonia, unspecified organism: Secondary | ICD-10-CM

## 2011-06-27 DIAGNOSIS — J90 Pleural effusion, not elsewhere classified: Secondary | ICD-10-CM

## 2011-06-27 DIAGNOSIS — Z940 Kidney transplant status: Secondary | ICD-10-CM | POA: Insufficient documentation

## 2011-06-27 DIAGNOSIS — N186 End stage renal disease: Secondary | ICD-10-CM | POA: Insufficient documentation

## 2011-06-27 DIAGNOSIS — M109 Gout, unspecified: Secondary | ICD-10-CM | POA: Insufficient documentation

## 2011-06-27 DIAGNOSIS — Z87891 Personal history of nicotine dependence: Secondary | ICD-10-CM | POA: Insufficient documentation

## 2011-06-27 DIAGNOSIS — D509 Iron deficiency anemia, unspecified: Secondary | ICD-10-CM | POA: Insufficient documentation

## 2011-06-27 LAB — POCT I-STAT, CHEM 8
Chloride: 99 mEq/L (ref 96–112)
Creatinine, Ser: 6.2 mg/dL — ABNORMAL HIGH (ref 0.50–1.35)
Glucose, Bld: 92 mg/dL (ref 70–99)
HCT: 30 % — ABNORMAL LOW (ref 39.0–52.0)
Hemoglobin: 10.2 g/dL — ABNORMAL LOW (ref 13.0–17.0)
Potassium: 3.8 mEq/L (ref 3.5–5.1)
Sodium: 140 mEq/L (ref 135–145)

## 2011-06-27 MED ORDER — MOXIFLOXACIN HCL 400 MG PO TABS: 400.0000 mg | ORAL_TABLET | Freq: Every day | ORAL | Status: AC

## 2011-06-27 MED ORDER — MOXIFLOXACIN HCL 400 MG PO TABS
400.0000 mg | ORAL_TABLET | Freq: Once | ORAL | Status: AC
  Administered 2011-06-27: 400 mg via ORAL
  Filled 2011-06-27: qty 1

## 2011-06-27 NOTE — ED Notes (Signed)
Pt with onset of generalized weakness/nausea/fatigue/coughing when lying down onset of symptoms Wednesday - hemodialysis yesterday - per pt slept through treatment yesterday -

## 2011-06-27 NOTE — ED Notes (Signed)
feeeling tired, sent for ucc for abn chest xray, possible pna.

## 2011-06-27 NOTE — ED Provider Notes (Signed)
History  Scribed for Nat Christen, MD, the patient was seen in room STRE1/STRE1. This chart was scribed by Candelaria Stagers. The patient's care started at 4:09 PM   CSN: [id number]  Arrival date & time 06/27/11  1336   First MD Initiated Contact with Patient 06/27/11 1550      Chief Complaint  Patient presents with  . Fatigue  . Cough  . Insomnia     HPI Roger Gibson is a 66 y.o. male who presents to the Emergency Department complaining of insomnia, fatigue, and cough for the last four days.  He is a dialysis pt.  He is also experiencing body aches, diarrhea, SOB, and lying down makes the cough worse.  Coughing is worse at night.  He denies fever or chest pain.  He has no h/o COPD and does not smoke.  Pt was seen at Urgent Care earlier today and was sent to the ED after xrays were taken showing fluid on the lungs.  He has h/o HTN.      Past Medical History  Diagnosis Date  . Iron deficiency anemia     PRE 2004/06/2000  . Gout 01/2002    Right knee; Left great toe  . HLD (hyperlipidemia) 1999  . HTN (hypertension) 1974  . Renal cysts, acquired, bilateral     per [employer] [facility]  . ESRD (end stage renal disease) on dialysis     T, Th, Sat HD in [place], [address]  . Confusion     during admission to [facility] 2012, persisted after discharge  . ESRD (end stage renal disease)     Past Surgical History  Procedure Date  . Nephrectomy 1965    [place]infection[place]  . Achilles tendon repair [address] [place]  . Mr Maxine Glenn abdomen 2000    3x3 mass left kidney  . 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 Margo Aye [facility]  . Colonoscopy 05/15/03    Polyp (biopsy negative) repeat in 5 years  . Ct abd w & pelvis wo cm 03/03/06    Right kidney absent; Left with cystic changes  . Cystoscopy 03/03/06    Mild to mod. BPH observed/ otherwise normal  . Kidney transplant 2006/07/07    deceased donor-RLQ Abd, partial neph for renal  cell CA/complete Nephrectomy for infection ([facility])  . Colonoscopy 07/18/08    1 rectal polyp (Dr. Laural Benes) 5 years    Family History  Problem Relation Age of Onset  . Stomach cancer Father     ?  Marland Kitchen Hypertension Mother     On dialysis for years  . Hypertension Brother   . Hypertension Brother   . Hypertension Brother   . Hypertension Sister   . Breast cancer Sister     s/p mastectomy  . COPD Sister   . Hypertension Sister   . Kidney failure Sister     on dialysis  . Heart attack Paternal Grandfather     History  Substance Use Topics  . Smoking status: Former Smoker    Types: Cigarettes, Cigars    Quit date: 01/25/1998  . Smokeless tobacco: Not on file   Comment: occasional cigar  . Alcohol Use: Yes     very rare      Review of Systems  Constitutional: Positive for fatigue. Negative for fever and chills.       Body aches, insomnia  HENT: Negative.   Eyes: Negative.  Negative  for discharge and redness.  Respiratory: Positive for cough and shortness of breath.   Cardiovascular: Negative.  Negative for chest pain.  Gastrointestinal: Positive for nausea and diarrhea. Negative for vomiting and abdominal pain.  Genitourinary: Negative.   Musculoskeletal: Negative.  Negative for back pain.  Skin: Negative.  Negative for color change and rash.  Neurological: Negative for syncope and headaches.  Hematological: Negative.  Negative for adenopathy.  Psychiatric/Behavioral: Negative.  Negative for confusion.  All other systems reviewed and are negative.    Allergies  Iodine and Tape  Home Medications   Current Outpatient Rx  Name Route Sig Dispense Refill  . AMLODIPINE BESYLATE 10 MG PO TABS Oral Take 10 mg by mouth daily.    Marland Kitchen DIALYVITE 800 PO Oral Take 800 mg by mouth daily.    Marland Kitchen CINACALCET HCL 30 MG PO TABS Oral Take 30 mg by mouth daily.    Marland Kitchen LABETALOL HCL 200 MG PO TABS Oral Take 200 mg by mouth 2 (two) times daily.    Marland Kitchen LISINOPRIL 20 MG PO TABS Oral Take 20 mg  by mouth daily.    . ADULT MULTIVITAMIN W/MINERALS CH Oral Take 1 tablet by mouth daily.    Marland Kitchen OMEPRAZOLE 20 MG PO CPDR Oral Take 20 mg by mouth daily.    Marland Kitchen SEVELAMER HCL 800 MG PO TABS Oral Take 800 mg by mouth 3 (three) times daily with meals.    Marland Kitchen SILDENAFIL CITRATE 50 MG PO TABS Oral Take 50 mg by mouth every other day as needed. For erectile dysfunction.      BP 145/79  Pulse 84  Temp(Src) 97.4 F (36.3 C) (Oral)  Resp 16  SpO2 93%  Physical Exam  Nursing note and vitals reviewed. Constitutional: He is oriented to person, place, and time. He appears well-developed and well-nourished. No distress.  HENT:  Head: Normocephalic and atraumatic.  Eyes: EOM are normal. Pupils are equal, round, and reactive to light.  Neck: Neck supple. No tracheal deviation present.  Cardiovascular: Normal rate.   Pulmonary/Chest: Effort normal. No respiratory distress.       Crackles at bilateral bases.  Abdominal: Soft. He exhibits no distension. There is no tenderness.  Musculoskeletal: Normal range of motion. He exhibits no edema.  Neurological: He is alert and oriented to person, place, and time. No sensory deficit.  Skin: Skin is warm and dry.  Psychiatric: He has a normal mood and affect. His behavior is normal.    ED Course  Procedures   DIAGNOSTIC STUDIES: Oxygen Saturation is 93% on room air, normal by my interpretation.    COORDINATION OF CARE:     Labs Reviewed - No data to display Dg Chest 2 View  06/27/2011  *RADIOLOGY REPORT*  Clinical Data: Weakness, nausea and cough.  CHEST - 2 VIEW  Comparison: 03/21/2010  Findings: There is mild cardiac enlargement.  There are small bilateral pleural effusions which are identified right greater than left.  Decreased aeration of both lung bases may indicate infiltrate or atelectasis.  IMPRESSION:  1.  Bilateral lower lobe opacities which may represent infiltrate or atelectasis. 2.  Pleural effusions.  Original Report Authenticated By: Rosealee Albee, M.D.     No diagnosis found.    MDM  Patient with clinical symptoms of increased cough and fatigue although he has not had fevers these symptoms could be consistent with pneumonia.  His x-ray is questionable for pneumonia along with his pleural effusions.  As the patient's vital signs are  normal I feel he is safe for discharge home but I'm going to place him on Avelox for treatment for pneumonia.  He'll be able to followup with his nephrologist this week.  He can also followup with his primary care physician as needed.  Patient to return for worsening difficulty breathing or pain or fevers.  Patient did have an i-STAT completed at the urgent care today which demonstrated normal electrolytes at this time.  I personally performed the services described in this documentation, which was scribed in my presence. The recorded information has been reviewed and considered.         Nat Christen, MD 06/27/11 [phone]

## 2011-06-27 NOTE — ED Notes (Signed)
Spoke to patient in lobby.  Sick since Thursday.  Feeling week, congestion, dry-heaves, nausea.  Patient is a dialysis patient, last dialysis was Saturday.

## 2011-06-27 NOTE — ED Provider Notes (Signed)
Medical screening examination/treatment/procedure(s) were performed by non-physician practitioner and as supervising physician I was immediately available for consultation/collaboration.  Westlynn Fifer, M.D.   Kailand Seda C Viridiana Spaid, MD 06/27/11 2037 

## 2011-06-27 NOTE — Discharge Instructions (Signed)

## 2011-06-27 NOTE — ED Provider Notes (Signed)
History     CSN: [id number]  Arrival date & time 06/27/11  1128   None     Chief Complaint  Patient presents with  . Weakness  . Nausea  . Cough  . Fatigue    (Consider location/radiation/quality/duration/timing/severity/associated sxs/prior treatment) HPI Comments: CC is fatigue. Pt with mild sore throat and dry cough for about 4 days. Feels exhausted.  Does not normally feel this way when he gets a cold. Cough worse when lying supine.    Patient is a 66 y.o. male presenting with cough and weakness. The history is provided by the patient and the spouse.  Cough This is a new problem. The current episode started more than 2 days ago. The problem has not changed since onset.The cough is non-productive. There has been no fever. Associated symptoms include sore throat. Pertinent negatives include no chills.  Weakness Primary symptoms do not include fever. The symptoms began 3 to 5 days ago. The symptoms are unchanged.  Additional symptoms include weakness. Associated symptoms comments: fatigue. Medical issues also include hypertension.    Past Medical History  Diagnosis Date  . Iron deficiency anemia     PRE 2004/06/2000  . Gout 01/2002    Right knee; Left great toe  . HLD (hyperlipidemia) 1999  . HTN (hypertension) 1974  . Renal cysts, acquired, bilateral     per [employer]  . ESRD (end stage renal disease) on dialysis     T, Th, Sat HD in [place], [address]  . Confusion     during admission to [facility] 2012, persisted after discharge  . ESRD (end stage renal disease)     Past Surgical History  Procedure Date  . Nephrectomy 1965    [place]  . Achilles tendon repair [address] [place]  . Mr Maxine Glenn abdomen 2000    3x3 mass left kidney  . 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 Margo Aye [facility]  . Colonoscopy 05/15/03    Polyp (biopsy negative) repeat in 5 years  . Ct abd w & pelvis wo cm  03/03/06    Right kidney absent; Left with cystic changes  . Cystoscopy 03/03/06    Mild to mod. BPH observed/ otherwise normal  . Kidney transplant 07-18-06    deceased donor-RLQ Abd, partial neph for renal cell CA/complete Nephrectomy for infection ([facility])  . Colonoscopy 07/18/08    1 rectal polyp (Dr. Laural Benes) 5 years    Family History  Problem Relation Age of Onset  . Stomach cancer Father     ?  Marland Kitchen Hypertension Mother     On dialysis for years  . Hypertension Brother   . Hypertension Brother   . Hypertension Brother   . Hypertension Sister   . Breast cancer Sister     s/p mastectomy  . COPD Sister   . Hypertension Sister   . Kidney failure Sister     on dialysis  . Heart attack Paternal Grandfather     History  Substance Use Topics  . Smoking status: Former Smoker    Types: Cigarettes, Cigars    Quit date: 01/25/1998  . Smokeless tobacco: Not on file   Comment: occasional cigar  . Alcohol Use: Yes     very rare      Review of Systems  Constitutional: Positive for activity change, appetite change and fatigue. Negative for fever and chills.  HENT: Positive for sore throat.  Respiratory: Positive for cough.   Neurological: Positive for weakness.    Allergies  Iodine and Tape  Home Medications   Current Outpatient Rx  Name Route Sig Dispense Refill  . ACETAMINOPHEN ER 650 MG PO TBCR Oral Take 650 mg by mouth as needed.      Marland Kitchen AMLODIPINE BESYLATE 10 MG PO TABS Oral Take 1 tablet (10 mg total) by mouth daily. 90 tablet 3  . CINACALCET HCL 30 MG PO TABS Oral Take 30 mg by mouth daily.    Marland Kitchen LABETALOL HCL 200 MG PO TABS Oral Take 1 tablet (200 mg total) by mouth 2 (two) times daily. 180 tablet 3  . LISINOPRIL 20 MG PO TABS Oral Take 20 mg by mouth daily.    . MEGESTROL ACETATE 400 MG/10ML PO SUSP Oral Take 400 mg by mouth daily.      . MULTIVITAMIN PO Oral Take by mouth daily.      Marland Kitchen OMEPRAZOLE 20 MG PO CPDR Oral Take 1 capsule (20 mg total) by mouth every morning.  90 capsule 3  . SEVELAMER HCL 800 MG PO TABS Oral Take 800 mg by mouth 3 (three) times daily with meals.      . ASPIRIN 81 MG PO TABS Oral Take 81 mg by mouth daily.    Marland Kitchen HYDROXYZINE HCL 25 MG PO TABS Oral Take 25 mg by mouth daily.    Marland Kitchen MAGNESIUM OXIDE 400 MG PO TABS Oral Take 1 tablet (400 mg total) by mouth 2 (two) times daily. 180 tablet 3  . SILDENAFIL CITRATE 50 MG PO TABS Oral Take 0.5-1 tablets (25-50 mg total) by mouth every other day as needed for erectile dysfunction (don't use daily). 10 tablet 4    BP 140/75  Pulse 84  Temp(Src) 99 F (37.2 C) (Oral)  Resp 17  SpO2 94%  Physical Exam  Constitutional: He appears well-developed and well-nourished. He appears ill.  HENT:  Right Ear: Tympanic membrane normal.  Left Ear: Tympanic membrane normal.  Mouth/Throat: Oropharynx is clear and moist.  Cardiovascular: Normal rate and regular rhythm.   Pulmonary/Chest: Effort normal. He has decreased breath sounds in the right lower field and the left lower field.  Lymphadenopathy:       Head (right side): No submental, no submandibular and no tonsillar adenopathy present.       Head (left side): No submental, no submandibular and no tonsillar adenopathy present.    ED Course  Procedures (including critical care time)  Labs Reviewed  POCT I-STAT, CHEM 8 - Abnormal; Notable for the following:    Creatinine, Ser 6.20 (*)    Calcium, Ion 1.01 (*)    Hemoglobin 10.2 (*)    HCT 30.0 (*)    All other components within normal limits   Dg Chest 2 View  06/27/2011  *RADIOLOGY REPORT*  Clinical Data: Weakness, nausea and cough.  CHEST - 2 VIEW  Comparison: 03/21/2010  Findings: There is mild cardiac enlargement.  There are small bilateral pleural effusions which are identified right greater than left.  Decreased aeration of both lung bases may indicate infiltrate or atelectasis.  IMPRESSION:  1.  Bilateral lower lobe opacities which may represent infiltrate or atelectasis. 2.  Pleural  effusions.  Original Report Authenticated By: Rosealee Albee, M.D.     1. Pleural effusion   2. Community acquired pneumonia   3. Fatigue       MDM  Concern for pneumonia, has b pleural effusions.  Pt appears ill.  Transfer to ED.         Cathlyn Parsons, NP 06/27/11 1315

## 2011-08-16 ENCOUNTER — Ambulatory Visit: Payer: Self-pay | Admitting: Vascular Surgery

## 2011-08-24 ENCOUNTER — Other Ambulatory Visit (HOSPITAL_COMMUNITY): Payer: Self-pay | Admitting: *Deleted

## 2011-08-25 ENCOUNTER — Encounter (HOSPITAL_COMMUNITY)
Admission: RE | Admit: 2011-08-25 | Discharge: 2011-08-25 | Disposition: A | Discharge status: Home / Self Care | Payer: Medicare HMO | Source: Ambulatory Visit | Attending: Nephrology | Admitting: Nephrology

## 2011-08-25 DIAGNOSIS — D509 Iron deficiency anemia, unspecified: Secondary | ICD-10-CM | POA: Insufficient documentation

## 2011-08-25 NOTE — Progress Notes (Addendum)
1045 breath sounds clear  bilaterally, diminished at both bases right >left.

## 2011-08-26 LAB — TYPE AND SCREEN
Antibody Screen: NEGATIVE
Unit division: 0

## 2011-09-26 ENCOUNTER — Other Ambulatory Visit: Payer: Self-pay | Admitting: Family Medicine

## 2011-10-03 ENCOUNTER — Other Ambulatory Visit: Payer: Self-pay | Admitting: Family Medicine

## 2011-10-22 ENCOUNTER — Encounter: Payer: Self-pay | Admitting: Internal Medicine

## 2011-11-03 ENCOUNTER — Encounter: Payer: Self-pay | Admitting: Internal Medicine

## 2011-11-05 ENCOUNTER — Ambulatory Visit (INDEPENDENT_AMBULATORY_CARE_PROVIDER_SITE_OTHER): Payer: Medicare Other | Admitting: Internal Medicine

## 2011-11-05 ENCOUNTER — Encounter: Payer: Self-pay | Admitting: Internal Medicine

## 2011-11-05 VITALS — BP 142/78 | HR 71 | Ht 70.0 in | Wt 169.0 lb

## 2011-11-05 DIAGNOSIS — R933 Abnormal findings on diagnostic imaging of other parts of digestive tract: Secondary | ICD-10-CM

## 2011-11-05 MED ORDER — PEG-KCL-NACL-NASULF-NA ASC-C 100 G PO SOLR: 1.0000 | Freq: Once | ORAL | Status: DC

## 2011-11-05 NOTE — Patient Instructions (Addendum)
You have been scheduled for a colonoscopy with propofol. Please follow written instructions given to you at your visit today.  Please pick up your prep kit at the pharmacy within the next 1-3 days. If you use inhalers (even only as needed), please bring them with you on the day of your procedure.  

## 2011-11-05 NOTE — Progress Notes (Signed)
Patient ID: Roger Gibson, male   DOB: August 02, 1945, 66 y.o.   MRN: [id number]  SUBJECTIVE: HPI This to Wurm is a 66 year old male with a past medical history of end-stage renal disease currently on hemodialysis, status post kidney transplant which failed after some time and he has been back on dialysis for about 2 years, gout, hypertension, hyperlipidemia who seen in consultation at request of Dr. Para March for evaluation of abnormal GI imaging.  The patient has had intermittent right flank pain which he's been treating with Aleve. This led to a CT scan which was performed at [facility] healthcare on 10/20/2011 and revealed diffuse rectal thickening. The patient denies change in his bowel habits. He's had no blood in his stool or melena. He denies constipation and diarrhea. He reports his stools have been of normal caliber.  He does continue on dialysis Tuesday, Thursday, and Saturday without issue.  He has a previous colonoscopy performed in 2010 by Dr. Laural Benes.  He denies fevers or chills. No nausea or vomiting. Appetite is good. Weight is stable. He has been reaching his dry weight at dialysis without issues. His right flank pain is intermittent and he feels secondary to bad mattress at home. This pain is worse with moving it is not seem to relate to the eating or bowel movement.  Review of Systems  As per history of present illness, otherwise negative   Past Medical History  Diagnosis Date  . Iron deficiency anemia     PRE 2004/06/2000  . Gout 01/2002    Right knee; Left great toe  . HLD (hyperlipidemia) 1999  . HTN (hypertension) 1974  . Renal cysts, acquired, bilateral     per [employer]  . ESRD (end stage renal disease) on dialysis     T, Th, Sat HD in [place], [address]  . Confusion     during admission to [facility] 2012, persisted after discharge  . ESRD (end stage renal disease)     Current Outpatient Prescriptions  Medication Sig Dispense Refill  .  amLODipine (NORVASC) 10 MG tablet TAKE 1 TABLET DAILY  90 tablet  0  . B Complex-C-Folic Acid (DIALYVITE 800 PO) Take 800 mg by mouth daily.      . calcitRIOL (ROCALTROL) 0.25 MCG capsule Take 0.25 mcg by mouth daily.      . cinacalcet (SENSIPAR) 30 MG tablet Take 30 mg by mouth daily.      . colchicine (COLCRYS) 0.6 MG tablet Take 0.6 mg by mouth daily.      Marland Kitchen labetalol (NORMODYNE) 200 MG tablet TAKE 1 TABLET TWICE A DAY  180 tablet  0  . lisinopril (PRINIVIL,ZESTRIL) 20 MG tablet Take 20 mg by mouth daily.      Marland Kitchen omeprazole (PRILOSEC) 20 MG capsule Take 20 mg by mouth daily.      . sevelamer (RENAGEL) 800 MG tablet Take 800 mg by mouth 3 (three) times daily with meals.      . sildenafil (VIAGRA) 50 MG tablet Take 50 mg by mouth every other day as needed. For erectile dysfunction.      . peg 3350 powder (MOVIPREP) 100 G SOLR Take 1 kit (100 g total) by mouth once.  1 kit  0    Allergies  Allergen Reactions  . Iodine Swelling  . Tape Rash    Adhesive tape     Family History  Problem Relation Age of Onset  . Stomach cancer Father     ?  Marland Kitchen  Hypertension Mother     On dialysis for years  . Hypertension Brother   . Hypertension Brother   . Hypertension Brother   . Hypertension Sister   . Breast cancer Sister     s/p mastectomy  . COPD Sister   . Hypertension Sister   . Kidney failure Sister     on dialysis  . Heart attack Paternal Grandfather     History  Substance Use Topics  . Smoking status: Former Smoker    Types: Cigarettes, Cigars    Quit date: 01/25/1998  . Smokeless tobacco: Not on file   Comment: occasional cigar  . Alcohol Use: Yes     very rare    OBJECTIVE: BP 142/78  Pulse 71  Ht 5\' 10"  (1.778 m)  Wt 169 lb (76.658 kg)  BMI 24.25 kg/m2 Constitutional: Well-developed and well-nourished. No distress. HEENT: Normocephalic and atraumatic. Oropharynx is clear and moist. No oropharyngeal exudate. Conjunctivae are normal. No scleral icterus. Neck: Neck  supple. Trachea midline. Cardiovascular: Normal rate, regular rhythm and intact distal pulses. No M/R/G Pulmonary/chest: Effort normal and breath sounds normal. No wheezing, rales or rhonchi. Abdominal: Soft, nontender, nondistended. Bowel sounds active throughout.  Extremities: no clubbing, cyanosis, or edema Lymphadenopathy: No cervical adenopathy noted. Neurological: Alert and oriented to person place and time. Skin: Skin is warm and dry. No rashes noted. Psychiatric: Normal mood and affect. Behavior is normal.   Imaging -- CT scan dated 10/20/2011, will be scanned to EMR --Findings: 1. Abnormal circumferential thickening of the rectum, difficult to evaluate on this noncontrast evaluation but may represent changes from colitis or potentially malignancy. 2. Diffuse aortocaval adenopathy with adjacent stranding, indeterminate but findings are concerning for possible metastatic disease given the above, with rectal cancer and lymphoma being possible malignant explanations for all findings, and infection/colitis a benign possibility. 3. Pleural calcifications are present in the right lower thorax .  Colonoscopy dated 07/18/2008 -- examination to the cecum with fair prep. Exam noted to be technically difficult due to looping.  Findings. Benign sessile rectal polyp, 4 mm in size, removed with cold snare. Otherwise normal exam. Pathology = hyperplastic polyp  ASSESSMENT AND PLAN:  66 year old male with a past medical history of end-stage renal disease currently on hemodialysis, status post kidney transplant which failed after some time and he has been back on dialysis for about 2 years, gout, hypertension, hyperlipidemia who seen in consultation at request of Dr. Para March for evaluation of abnormal GI imaging.  Abnormal GI imaging -- it is reassuring to me that the patient has a history of colonoscopy, as recent as July 2010. Also, he is not having concerning rectal symptoms.  However given the CT  findings, I recommended direct visualization with repeat colonoscopy. We discussed the test and he is agreeable to proceed.  This will be performed on nondialysis day.

## 2011-12-06 ENCOUNTER — Ambulatory Visit (AMBULATORY_SURGERY_CENTER): Payer: Medicare Other | Admitting: Internal Medicine

## 2011-12-06 ENCOUNTER — Encounter: Payer: Self-pay | Admitting: Internal Medicine

## 2011-12-06 VITALS — BP 143/74 | HR 65 | Temp 97.8°F | Resp 18 | Ht 70.0 in | Wt 169.0 lb

## 2011-12-06 DIAGNOSIS — D126 Benign neoplasm of colon, unspecified: Secondary | ICD-10-CM

## 2011-12-06 DIAGNOSIS — R933 Abnormal findings on diagnostic imaging of other parts of digestive tract: Secondary | ICD-10-CM

## 2011-12-06 MED ORDER — SODIUM CHLORIDE 0.9 % IV SOLN: 500.0000 mL | INTRAVENOUS | Status: DC

## 2011-12-06 NOTE — Op Note (Signed)
[facility] [address] [place], [address]   COLONOSCOPY PROCEDURE REPORT  PATIENT: Roger, Gibson  MR#: [id number] BIRTHDATE: October 19, 1945 , 66  yrs. old GENDER: Male ENDOSCOPIST: Beverley Fiedler, MD REFERRED XB:[id number], Cheree Ditto PROCEDURE DATE:  12/06/2011 PROCEDURE:   Colonoscopy with snare polypectomy ASA CLASS:   Class III INDICATIONS:an abnormal CT. MEDICATIONS: MAC sedation, administered by CRNA and propofol (Diprivan) 250mg  IV  DESCRIPTION OF PROCEDURE:   After the risks benefits and alternatives of the procedure were thoroughly explained, informed consent was obtained.  A digital rectal exam revealed no rectal mass.   The LB CF-[id number]  endoscope was introduced through the anus and advanced to the cecum, which was identified by both the appendix and ileocecal valve. No adverse events experienced. The quality of the prep was good, using MoviPrep  The instrument was then slowly withdrawn as the colon was fully examined.  COLON FINDINGS: Five sessile polyps ranging between 3-6 mm in size were found in the ascending colon, transverse colon, at the splenic flexure, and in the sigmoid colon.  Polypectomy was performed using cold snare.  All resections were complete and all polyp tissue was completely retrieved.   The colon was otherwise normal.  There was no diverticulosis, inflammation, or cancers unless previously stated.   Small internal hemorrhoids were found.  Retroflexed views revealed internal hemorrhoids. The time to cecum=5 minutes 51 seconds.  Withdrawal time=18 minutes 33 seconds.  The scope was withdrawn and the procedure completed. COMPLICATIONS: There were no complications.  ENDOSCOPIC IMPRESSION: 1.   Five sessile polyps ranging between 3-32mm in size were found in the ascending colon, transverse colon, at the splenic flexure, and in the sigmoid colon; Polypectomy was performed using cold snare 2.   The colon was otherwise normal, with  no abnormalities seen in the rectum as suggested by recent CT scan 3.   Small internal hemorrhoids  RECOMMENDATIONS: 1.  Await pathology results 2.  Timing of repeat colonoscopy will be determined by pathology findings. 3.  You will receive a letter within 1-2 weeks with the results of your biopsy as well as final recommendations.  Please call my office if you have not received a letter after 3 weeks.   eSigned:  Beverley Fiedler, MD 12/06/2011 2:14 PM   cc: Crawford Givens, MD and The Patient

## 2011-12-06 NOTE — Progress Notes (Signed)
Patient did not have preoperative order for IV antibiotic SSI prophylaxis. (G8918)  Patient did not experience any of the following events: a burn prior to discharge; a fall within the facility; wrong site/side/patient/procedure/implant event; or a hospital transfer or hospital admission upon discharge from the facility. (G8907)  

## 2011-12-06 NOTE — Progress Notes (Signed)
Per the pt the last day he dialysised was Saturday.  He said he only ate 2 crackers on sat. Maw

## 2011-12-06 NOTE — Patient Instructions (Addendum)
YOU HAD AN ENDOSCOPIC PROCEDURE TODAY AT THE [facility]: Refer to the procedure report that was given to you for any specific questions about what was found during the examination.  If the procedure report does not answer your questions, please call your gastroenterologist to clarify.  If you requested that your care partner not be given the details of your procedure findings, then the procedure report has been included in a sealed envelope for you to review at your convenience later.  YOU SHOULD EXPECT: Some feelings of bloating in the abdomen. Passage of more gas than usual.  Walking can help get rid of the air that was put into your GI tract during the procedure and reduce the bloating. If you had a lower endoscopy (such as a colonoscopy or flexible sigmoidoscopy) you may notice spotting of blood in your stool or on the toilet paper. If you underwent a bowel prep for your procedure, then you may not have a normal bowel movement for a few days.  DIET: Your first meal following the procedure should be a light meal and then it is ok to progress to your normal diet.  A half-sandwich or bowl of soup is an example of a good first meal.  Heavy or fried foods are harder to digest and may make you feel nauseous or bloated.  Likewise meals heavy in dairy and vegetables can cause extra gas to form and this can also increase the bloating.  Drink plenty of fluids but you should avoid alcoholic beverages for 24 hours.  ACTIVITY: Your care partner should take you home directly after the procedure.  You should plan to take it easy, moving slowly for the rest of the day.  You can resume normal activity the day after the procedure however you should NOT DRIVE or use heavy machinery for 24 hours (because of the sedation medicines used during the test).    SYMPTOMS TO REPORT IMMEDIATELY: A gastroenterologist can be reached at any hour.  During normal business hours, 8:30 AM to 5:00 PM Monday through Friday,  call [phone].  After hours and on weekends, please call the GI answering service at [phone] who will take a message and have the physician on call contact you.   Following lower endoscopy (colonoscopy or flexible sigmoidoscopy):  Excessive amounts of blood in the stool  Significant tenderness or worsening of abdominal pains  Swelling of the abdomen that is new, acute  Fever of 100F or higher  FOLLOW UP: If any biopsies were taken you will be contacted by phone or by letter within the next 1-3 weeks.  Call your gastroenterologist if you have not heard about the biopsies in 3 weeks.  Our staff will call the home number listed on your records the next business day following your procedure to check on you and address any questions or concerns that you may have at that time regarding the information given to you following your procedure. This is a courtesy call and so if there is no answer at the home number and we have not heard from you through the emergency physician on call, we will assume that you have returned to your regular daily activities without incident.  SIGNATURES/CONFIDENTIALITY: You and/or your care partner have signed paperwork which will be entered into your electronic medical record.  These signatures attest to the fact that that the information above on your After Visit Summary has been reviewed and is understood.  Full responsibility of the confidentiality of this   discharge information lies with you and/or your care-partner.   Thank-you for choosing us for your healthcare needs. 

## 2011-12-07 ENCOUNTER — Telehealth: Payer: Self-pay

## 2011-12-07 NOTE — Telephone Encounter (Signed)
  Follow up Call-  Call back number 12/06/2011  Post procedure Call Back phone  # [phone] hm  Permission to leave phone message Yes     Patient questions:  Do you have a fever, pain , or abdominal swelling? no Pain Score  0 *  Have you tolerated food without any problems? yes  Have you been able to return to your normal activities? yes  Do you have any questions about your discharge instructions: Diet   no Medications  no Follow up visit  no  Do you have questions or concerns about your Care? no  Actions: * If pain score is 4 or above: No action needed, pain <4.

## 2011-12-10 ENCOUNTER — Other Ambulatory Visit: Payer: Self-pay | Admitting: Family Medicine

## 2011-12-10 NOTE — Telephone Encounter (Signed)
Electronic refill request.  Pt not seen since 03/19/11 and that was for an acute visit.  Please advise.

## 2011-12-11 NOTE — Telephone Encounter (Signed)
Sent, schedule a CPE.  Thanks.  

## 2011-12-13 NOTE — Telephone Encounter (Signed)
Spoke with patient.  He will call in to schedule CPE at a later date.

## 2011-12-14 ENCOUNTER — Encounter: Payer: Self-pay | Admitting: Internal Medicine

## 2012-05-22 ENCOUNTER — Other Ambulatory Visit (HOSPITAL_COMMUNITY): Payer: Self-pay | Admitting: Nephrology

## 2012-05-22 ENCOUNTER — Encounter (HOSPITAL_COMMUNITY): Payer: Self-pay | Admitting: Pharmacy Technician

## 2012-05-22 ENCOUNTER — Other Ambulatory Visit: Payer: Self-pay

## 2012-05-22 DIAGNOSIS — N186 End stage renal disease: Secondary | ICD-10-CM

## 2012-05-22 LAB — POTASSIUM: Potassium: 5.3 mmol/L — ABNORMAL HIGH (ref 3.5–5.1)

## 2012-05-23 ENCOUNTER — Other Ambulatory Visit (HOSPITAL_COMMUNITY): Payer: Self-pay | Admitting: Nephrology

## 2012-05-23 ENCOUNTER — Ambulatory Visit (HOSPITAL_COMMUNITY)
Admission: RE | Admit: 2012-05-23 | Discharge: 2012-05-23 | Disposition: A | Discharge status: Home / Self Care | Payer: Medicare HMO | Source: Ambulatory Visit | Attending: Nephrology | Admitting: Nephrology

## 2012-05-23 VITALS — BP 152/81 | HR 75 | Temp 97.5°F | Resp 22

## 2012-05-23 DIAGNOSIS — I871 Compression of vein: Secondary | ICD-10-CM | POA: Insufficient documentation

## 2012-05-23 DIAGNOSIS — N186 End stage renal disease: Secondary | ICD-10-CM

## 2012-05-23 DIAGNOSIS — I82609 Acute embolism and thrombosis of unspecified veins of unspecified upper extremity: Secondary | ICD-10-CM | POA: Insufficient documentation

## 2012-05-23 DIAGNOSIS — Z85528 Personal history of other malignant neoplasm of kidney: Secondary | ICD-10-CM | POA: Insufficient documentation

## 2012-05-23 DIAGNOSIS — T82898A Other specified complication of vascular prosthetic devices, implants and grafts, initial encounter: Secondary | ICD-10-CM | POA: Insufficient documentation

## 2012-05-23 DIAGNOSIS — Z940 Kidney transplant status: Secondary | ICD-10-CM | POA: Insufficient documentation

## 2012-05-23 DIAGNOSIS — M109 Gout, unspecified: Secondary | ICD-10-CM | POA: Insufficient documentation

## 2012-05-23 DIAGNOSIS — Z905 Acquired absence of kidney: Secondary | ICD-10-CM | POA: Insufficient documentation

## 2012-05-23 DIAGNOSIS — Z91041 Radiographic dye allergy status: Secondary | ICD-10-CM | POA: Insufficient documentation

## 2012-05-23 DIAGNOSIS — E785 Hyperlipidemia, unspecified: Secondary | ICD-10-CM | POA: Insufficient documentation

## 2012-05-23 DIAGNOSIS — N281 Cyst of kidney, acquired: Secondary | ICD-10-CM | POA: Insufficient documentation

## 2012-05-23 DIAGNOSIS — Z79899 Other long term (current) drug therapy: Secondary | ICD-10-CM | POA: Insufficient documentation

## 2012-05-23 DIAGNOSIS — Y832 Surgical operation with anastomosis, bypass or graft as the cause of abnormal reaction of the patient, or of later complication, without mention of misadventure at the time of the procedure: Secondary | ICD-10-CM | POA: Insufficient documentation

## 2012-05-23 DIAGNOSIS — Z9109 Other allergy status, other than to drugs and biological substances: Secondary | ICD-10-CM | POA: Insufficient documentation

## 2012-05-23 DIAGNOSIS — D509 Iron deficiency anemia, unspecified: Secondary | ICD-10-CM | POA: Insufficient documentation

## 2012-05-23 DIAGNOSIS — I120 Hypertensive chronic kidney disease with stage 5 chronic kidney disease or end stage renal disease: Secondary | ICD-10-CM | POA: Insufficient documentation

## 2012-05-23 MED ORDER — IOHEXOL 300 MG/ML  SOLN
100.0000 mL | Freq: Once | INTRAMUSCULAR | Status: AC | PRN
  Administered 2012-05-23: 30 mL via INTRAVENOUS

## 2012-05-23 MED ORDER — FENTANYL CITRATE 0.05 MG/ML IJ SOLN
INTRAMUSCULAR | Status: AC
  Filled 2012-05-23: qty 4

## 2012-05-23 MED ORDER — GELATIN ABSORBABLE 12-7 MM EX MISC
CUTANEOUS | Status: AC
  Filled 2012-05-23: qty 1

## 2012-05-23 MED ORDER — HEPARIN SODIUM (PORCINE) 1000 UNIT/ML IJ SOLN
INTRAMUSCULAR | Status: AC
  Filled 2012-05-23: qty 1

## 2012-05-23 MED ORDER — MIDAZOLAM HCL 2 MG/2ML IJ SOLN
INTRAMUSCULAR | Status: AC | PRN
  Administered 2012-05-23 (×3): 1 mg via INTRAVENOUS

## 2012-05-23 MED ORDER — ALTEPLASE 100 MG IV SOLR
2.0000 mg | Freq: Once | INTRAVENOUS | Status: AC
  Administered 2012-05-23: 2 mg
  Filled 2012-05-23: qty 2

## 2012-05-23 MED ORDER — MIDAZOLAM HCL 2 MG/2ML IJ SOLN
INTRAMUSCULAR | Status: AC
  Filled 2012-05-23: qty 4

## 2012-05-23 MED ORDER — HEPARIN SODIUM (PORCINE) 1000 UNIT/ML IJ SOLN
INTRAMUSCULAR | Status: AC | PRN
  Administered 2012-05-23: 3000 [IU] via INTRAVENOUS

## 2012-05-23 MED ORDER — FENTANYL CITRATE 0.05 MG/ML IJ SOLN
INTRAMUSCULAR | Status: AC | PRN
  Administered 2012-05-23 (×3): 50 ug via INTRAVENOUS

## 2012-05-23 MED ORDER — ALTEPLASE 100 MG IV SOLR: 2.0000 mg | Freq: Once | INTRAVENOUS | Status: DC

## 2012-05-23 NOTE — ED Notes (Signed)
Procedure start for HD cath placement

## 2012-05-23 NOTE — H&P (Signed)
Roger Gibson is an 67 y.o. male.   Chief Complaint: clotted left arm dialysis fistula HPI: Patient with ESRD and thrombosed left upper arm AVF presents today for thrombolysis, possible angioplasty/stenting of fistula or placement of new catheter if needed.  Past Medical History  Diagnosis Date  . Iron deficiency anemia     PRE 2004/06/2000  . Gout 01/2002    Right knee; Left great toe  . HLD (hyperlipidemia) 1999  . HTN (hypertension) 1974  . Confusion     during admission to [facility] 2012, persisted after discharge  . Renal cysts, acquired, bilateral     per [employer]  . ESRD (end stage renal disease) on dialysis     T, Th, Sat HD in [place], [address]  . ESRD (end stage renal disease)   . Allergy   . Blood transfusion without reported diagnosis   . Cancer     right kidney ca    Past Surgical History  Procedure Laterality Date  . Nephrectomy  1965    [place]infection[place]  . Achilles tendon repair  [address] [place]  . Mr Maxine Glenn abdomen  2000    3x3 mass left kidney  . Nephrectomy  2000    partial-(2nd to renal carcinoma) Dr. Margo Aye [facility]  . Colonoscopy  05/15/03    Polyp (biopsy negative) repeat in 5 years  . Ct abd w & pelvis wo cm  03/03/06    Right kidney absent; Left with cystic changes  . Cystoscopy  03/03/06    Mild to mod. BPH observed/ otherwise normal  . Kidney transplant  07/13/06    deceased donor-RLQ Abd, partial neph for renal cell CA/complete Nephrectomy for infection ([facility])  . Colonoscopy  07/18/08    1 rectal polyp (Dr. Laural Benes) 5 years    Family History  Problem Relation Age of Onset  . Stomach cancer Father     ?  Marland Kitchen Hypertension Mother     On dialysis for years  . Hypertension Brother   . Hypertension Brother   . Hypertension Brother   . Hypertension Sister   . Breast cancer Sister     s/p mastectomy  . COPD Sister   . Hypertension Sister   . Kidney failure Sister     on dialysis  . Heart  attack Paternal Grandfather   . Colon cancer Neg Hx   . Esophageal cancer Neg Hx   . Rectal cancer Neg Hx    Social History:  reports that he quit smoking about 14 years ago. His smoking use included Cigarettes and Cigars. He smoked 0.00 packs per day. He does not have any smokeless tobacco history on file. He reports that  drinks alcohol. He reports that he does not use illicit drugs.  Allergies:  Allergies  Allergen Reactions  . Iodine Swelling  . Tape Rash    Adhesive tape     Current outpatient prescriptions:amLODipine (NORVASC) 10 MG tablet, Take 10 mg by mouth daily., Disp: , Rfl: ;  B Complex-C-Folic Acid (DIALYVITE 800 PO), Take 800 mg by mouth daily., Disp: , Rfl: ;  cinacalcet (SENSIPAR) 30 MG tablet, Take 30 mg by mouth daily., Disp: , Rfl: ;  colchicine (COLCRYS) 0.6 MG tablet, Take 0.6 mg by mouth daily., Disp: , Rfl:  diphenhydrAMINE (BENADRYL) 25 mg capsule, Take 25 mg by mouth daily as needed for itching or allergies., Disp: , Rfl: ;  labetalol (NORMODYNE) 200 MG tablet,  Take 200 mg by mouth 2 (two) times daily., Disp: , Rfl: ;  lisinopril (PRINIVIL,ZESTRIL) 20 MG tablet, Take 20 mg by mouth daily., Disp: , Rfl: ;  omeprazole (PRILOSEC) 20 MG capsule, Take 20 mg by mouth daily., Disp: , Rfl:  predniSONE (DELTASONE) 50 MG tablet, Take 50 mg by mouth 3 (three) times daily. Take 1 tablet 13 hours prior to procedure, 1 tablet 7 hours prior to procedure, and 1 tablet 1 hour prior to procedure, Disp: , Rfl: ;  sevelamer (RENAGEL) 800 MG tablet, Take 800-2,400 mg by mouth 3 (three) times daily with meals. Take 3 tablets in the morning, 1 tablet at lunch, and 3 tablets with dinner, Disp: , Rfl:  Current facility-administered medications:alteplase (ACTIVASE) injection 2 mg, 2 mg, Intracatheter, Once, Berdine Dance, MD   No results found for this or any previous visit (from the past 48 hour(s)). No results found.  Review of Systems  Constitutional: Negative for fever and chills.   Respiratory: Negative for cough and shortness of breath.   Cardiovascular: Negative for chest pain.  Gastrointestinal: Negative for nausea, vomiting and abdominal pain.  Musculoskeletal: Negative for back pain.  Neurological: Negative for headaches.  Endo/Heme/Allergies: Does not bruise/bleed easily.    Blood pressure 145/80, pulse 70, temperature 97.5 F (36.4 C), temperature source Oral, resp. rate 13, SpO2 100.00%. Physical Exam  Constitutional: He is oriented to person, place, and time. He appears well-developed and well-nourished.  Cardiovascular: Normal rate and regular rhythm.   Left upper AVF with no thrill/bruit  Respiratory: Effort normal and breath sounds normal.  GI: Soft. Bowel sounds are normal.  Musculoskeletal: Normal range of motion. He exhibits no edema.  Neurological: He is alert and oriented to person, place, and time.     Assessment/Plan Pt with ESRD and thrombosed left upper arm AVF. Plan is for thrombolysis, possible angioplasty/stenting of fistula or placement of new catheter if needed. Details/risks of procedure d/w pt with his understanding and consent.  ALLRED,D KEVIN 05/23/2012, 9:36 AM

## 2012-05-23 NOTE — Procedures (Signed)
Unsuccessful LUE AVF DECLOT PROCEDURE   SUCCESSFUL RT IJ HD CATH INSERTION  NO COMP STABLE FULL REPORT IN PACS

## 2012-05-23 NOTE — ED Notes (Signed)
Declot unsuccessful; HD Cath will be placed

## 2012-05-24 ENCOUNTER — Telehealth (HOSPITAL_COMMUNITY): Payer: Self-pay | Admitting: *Deleted

## 2012-05-26 ENCOUNTER — Ambulatory Visit: Payer: Self-pay | Admitting: Vascular Surgery

## 2012-06-06 ENCOUNTER — Other Ambulatory Visit: Payer: Self-pay | Admitting: Nephrology

## 2012-06-06 LAB — HEMOGLOBIN: HGB: 8.8 g/dL — ABNORMAL LOW (ref 13.0–18.0)

## 2012-06-12 IMAGING — CR DG CHEST 2V
2 series · 2 of 2 positions shown · non-contrast
Comparison: 03/21/2010

CLINICAL DATA: Weakness, nausea and cough.

CHEST - 2 VIEW

[view not recorded (1 of 2)]
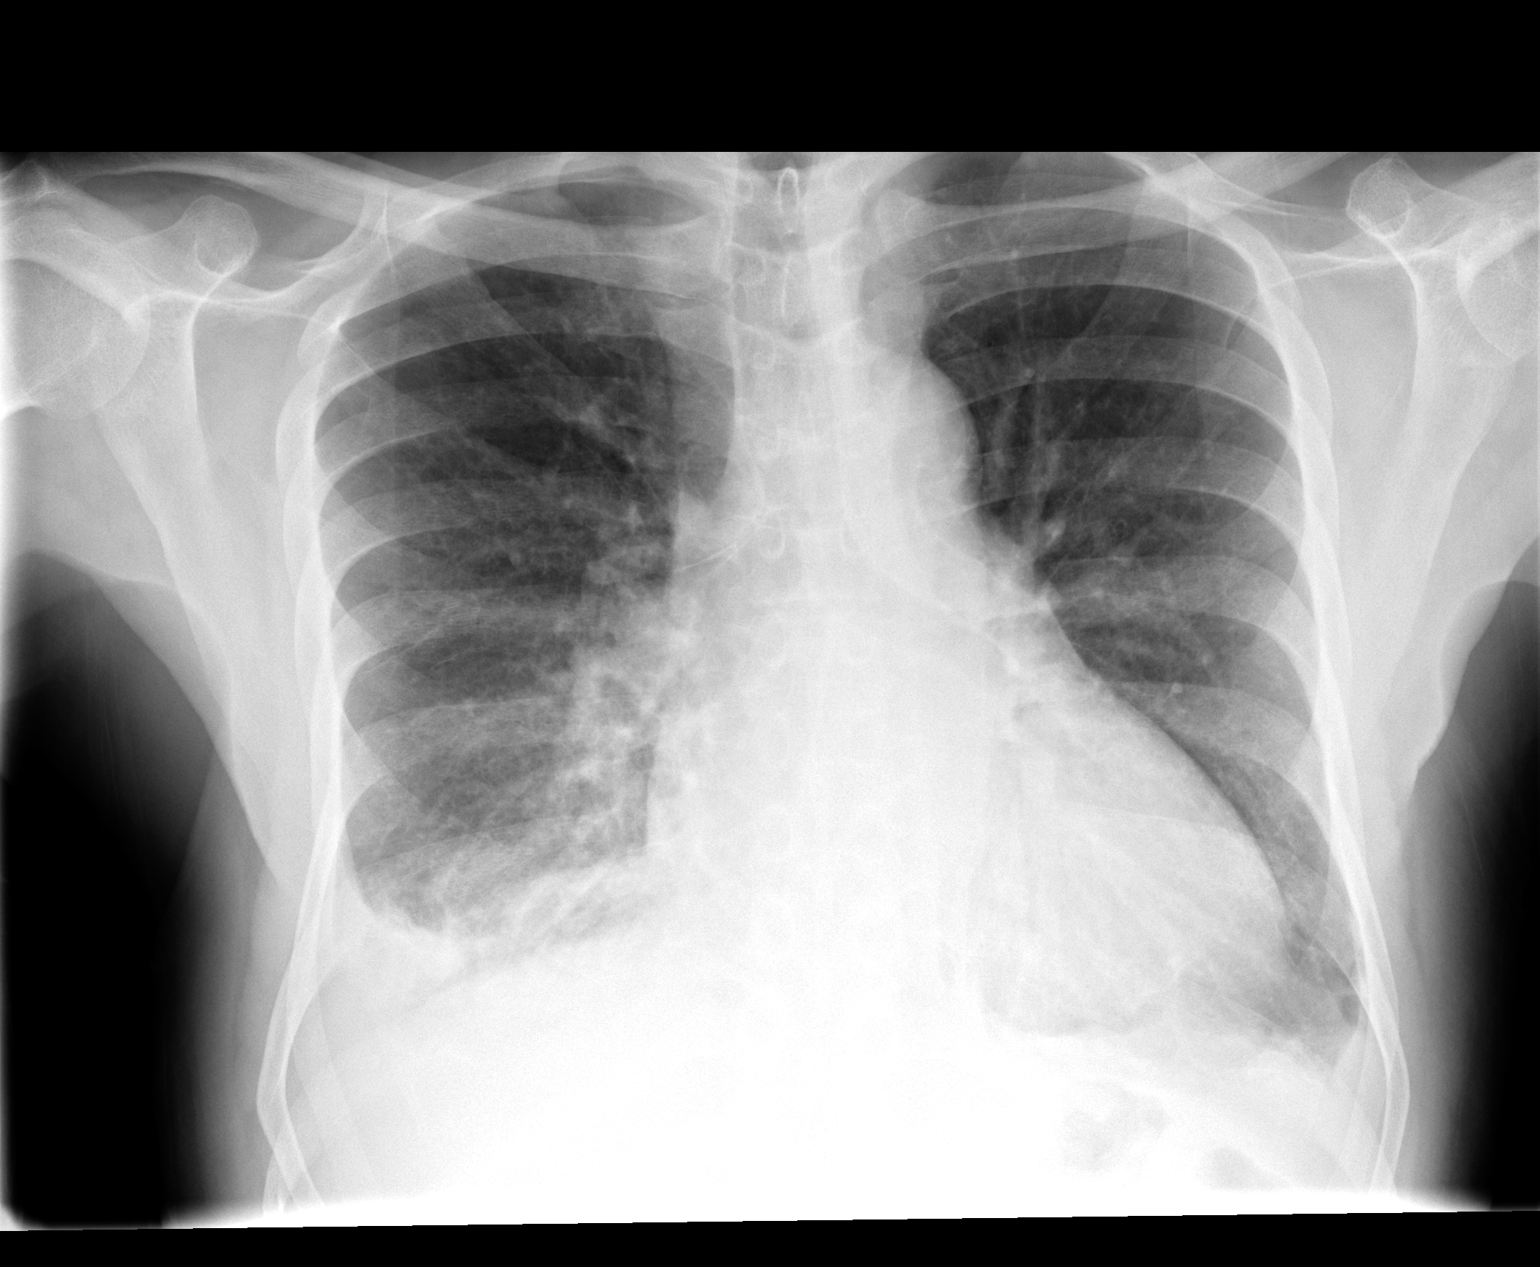

[view not recorded (2 of 2)]
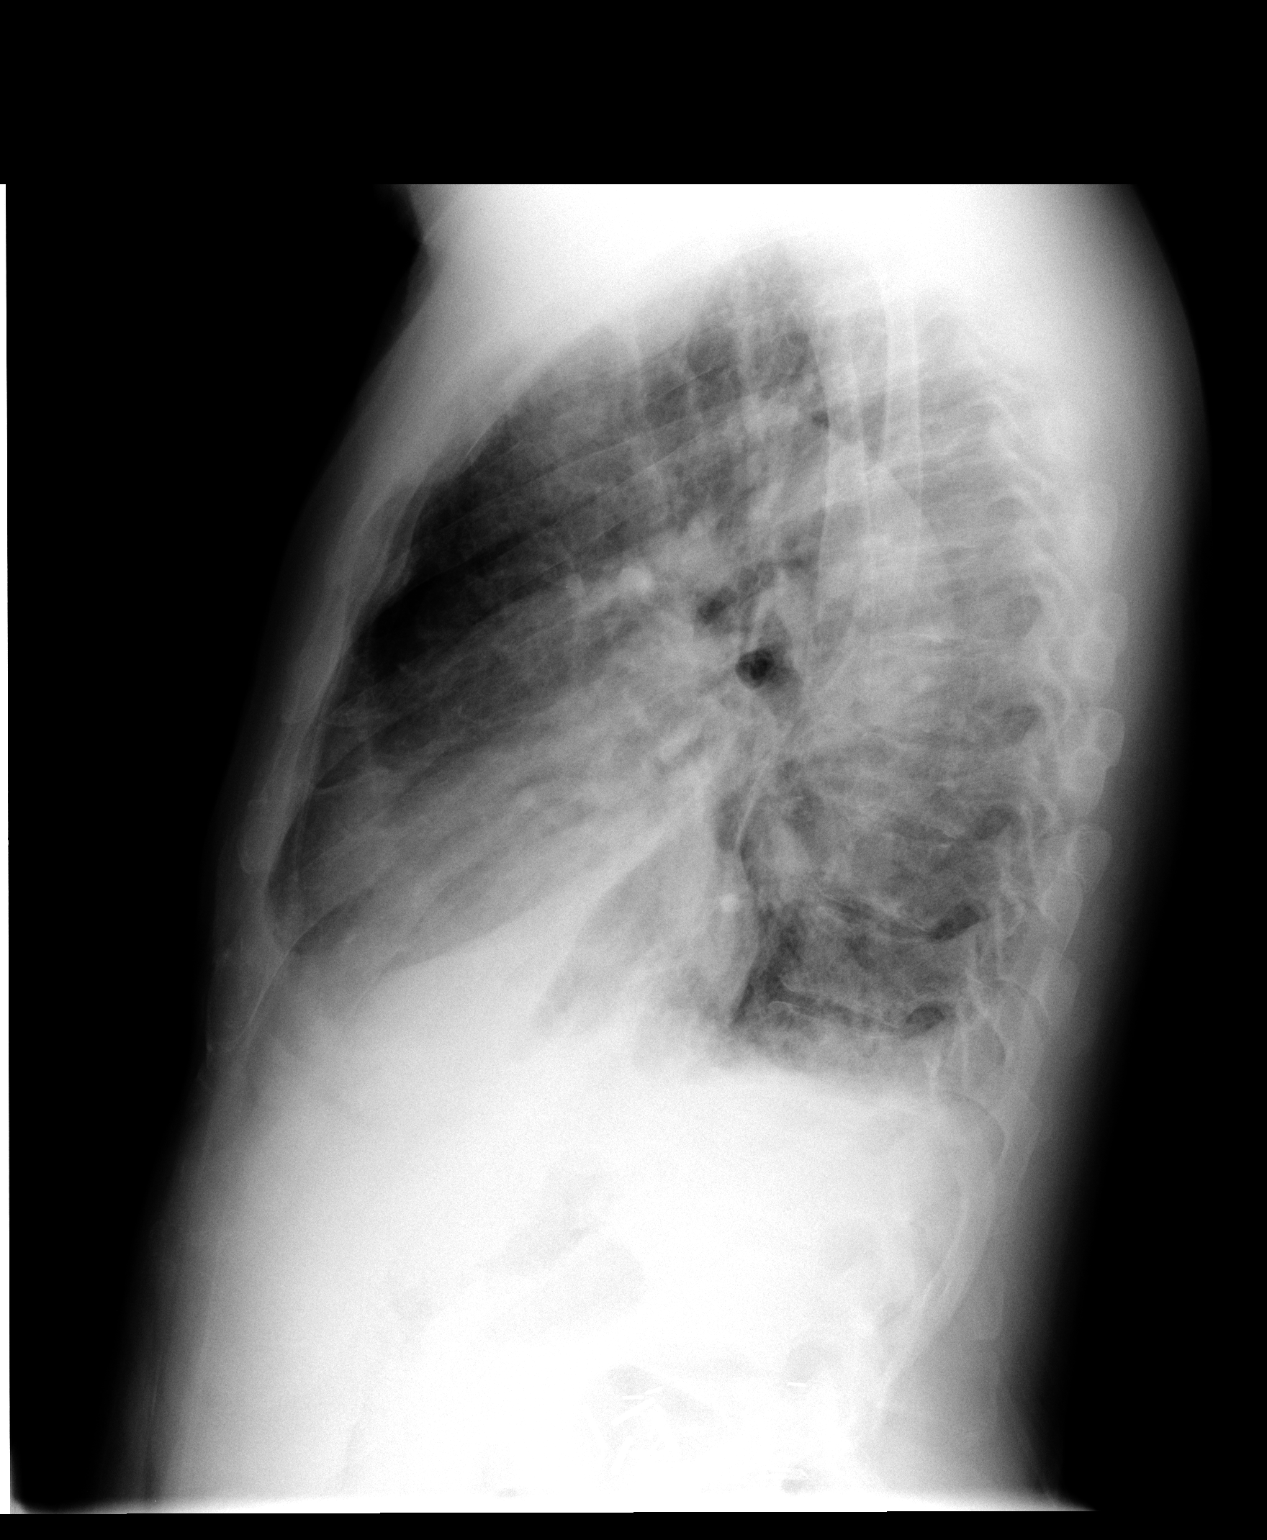

[2 of 2 positions shown; findings below may reference images not displayed]

FINDINGS: There is mild cardiac enlargement.

There are small bilateral pleural effusions which are identified
right greater than left.

Decreased aeration of both lung bases may indicate infiltrate or
atelectasis.
IMPRESSION: 1.  Bilateral lower lobe opacities which may represent infiltrate
or atelectasis.
2.  Pleural effusions.

## 2012-06-14 ENCOUNTER — Ambulatory Visit: Payer: Self-pay | Admitting: Vascular Surgery

## 2012-08-01 ENCOUNTER — Observation Stay (HOSPITAL_COMMUNITY)
Admission: EM | Admit: 2012-08-01 | Discharge: 2012-08-03 | Disposition: A | Discharge status: Home / Self Care | Payer: Medicare HMO | Attending: Family Medicine | Admitting: Family Medicine

## 2012-08-01 ENCOUNTER — Observation Stay (HOSPITAL_COMMUNITY): Payer: Medicare HMO

## 2012-08-01 ENCOUNTER — Encounter (HOSPITAL_COMMUNITY): Payer: Self-pay

## 2012-08-01 ENCOUNTER — Emergency Department (HOSPITAL_COMMUNITY): Payer: Medicare HMO

## 2012-08-01 DIAGNOSIS — I369 Nonrheumatic tricuspid valve disorder, unspecified: Secondary | ICD-10-CM

## 2012-08-01 DIAGNOSIS — G92 Toxic encephalopathy: Secondary | ICD-10-CM | POA: Insufficient documentation

## 2012-08-01 DIAGNOSIS — E8770 Fluid overload, unspecified: Secondary | ICD-10-CM

## 2012-08-01 DIAGNOSIS — J811 Chronic pulmonary edema: Principal | ICD-10-CM | POA: Insufficient documentation

## 2012-08-01 DIAGNOSIS — E875 Hyperkalemia: Secondary | ICD-10-CM | POA: Insufficient documentation

## 2012-08-01 DIAGNOSIS — Z992 Dependence on renal dialysis: Secondary | ICD-10-CM | POA: Insufficient documentation

## 2012-08-01 DIAGNOSIS — R0602 Shortness of breath: Secondary | ICD-10-CM | POA: Insufficient documentation

## 2012-08-01 DIAGNOSIS — N186 End stage renal disease: Secondary | ICD-10-CM | POA: Insufficient documentation

## 2012-08-01 DIAGNOSIS — I120 Hypertensive chronic kidney disease with stage 5 chronic kidney disease or end stage renal disease: Secondary | ICD-10-CM | POA: Insufficient documentation

## 2012-08-01 DIAGNOSIS — E8779 Other fluid overload: Secondary | ICD-10-CM | POA: Insufficient documentation

## 2012-08-01 DIAGNOSIS — E785 Hyperlipidemia, unspecified: Secondary | ICD-10-CM | POA: Diagnosis present

## 2012-08-01 DIAGNOSIS — G929 Unspecified toxic encephalopathy: Secondary | ICD-10-CM | POA: Insufficient documentation

## 2012-08-01 DIAGNOSIS — D638 Anemia in other chronic diseases classified elsewhere: Secondary | ICD-10-CM | POA: Insufficient documentation

## 2012-08-01 DIAGNOSIS — N2581 Secondary hyperparathyroidism of renal origin: Secondary | ICD-10-CM | POA: Insufficient documentation

## 2012-08-01 DIAGNOSIS — J9600 Acute respiratory failure, unspecified whether with hypoxia or hypercapnia: Secondary | ICD-10-CM

## 2012-08-01 DIAGNOSIS — J9690 Respiratory failure, unspecified, unspecified whether with hypoxia or hypercapnia: Secondary | ICD-10-CM

## 2012-08-01 DIAGNOSIS — I10 Essential (primary) hypertension: Secondary | ICD-10-CM | POA: Diagnosis present

## 2012-08-01 DIAGNOSIS — M109 Gout, unspecified: Secondary | ICD-10-CM | POA: Insufficient documentation

## 2012-08-01 LAB — CBC WITH DIFFERENTIAL/PLATELET
Lymphocytes Relative: 19 % (ref 12–46)
Lymphs Abs: 1.8 10*3/uL (ref 0.7–4.0)
Neutro Abs: 7.3 10*3/uL (ref 1.7–7.7)
Neutrophils Relative %: 75 % (ref 43–77)
Platelets: 348 10*3/uL (ref 150–400)
RBC: 3.83 MIL/uL — ABNORMAL LOW (ref 4.22–5.81)
WBC: 9.7 10*3/uL (ref 4.0–10.5)

## 2012-08-01 LAB — BASIC METABOLIC PANEL
BUN: 23 mg/dL (ref 6–23)
CO2: 27 mEq/L (ref 19–32)
Calcium: 9 mg/dL (ref 8.4–10.5)
Chloride: 91 mEq/L — ABNORMAL LOW (ref 96–112)
Creatinine, Ser: 5.75 mg/dL — ABNORMAL HIGH (ref 0.50–1.35)
Potassium: 4.5 mEq/L (ref 3.5–5.1)
Sodium: 133 mEq/L — ABNORMAL LOW (ref 135–145)
Sodium: 138 mEq/L (ref 135–145)

## 2012-08-01 LAB — RENAL FUNCTION PANEL
Albumin: 3.1 g/dL — ABNORMAL LOW (ref 3.5–5.2)
Chloride: 91 mEq/L — ABNORMAL LOW (ref 96–112)
Creatinine, Ser: 9.44 mg/dL — ABNORMAL HIGH (ref 0.50–1.35)
GFR calc non Af Amer: 5 mL/min — ABNORMAL LOW (ref >90)
Potassium: 6.4 mEq/L (ref 3.5–5.1)

## 2012-08-01 LAB — PRO B NATRIURETIC PEPTIDE: Pro B Natriuretic peptide (BNP): >70000 pg/mL — ABNORMAL HIGH (ref 0–125)

## 2012-08-01 LAB — POCT I-STAT 3, ART BLOOD GAS (G3+)
Acid-Base Excess: 10 mmol/L — ABNORMAL HIGH (ref 0.0–2.0)
pCO2 arterial: 41.1 mmHg (ref 35.0–45.0)
pO2, Arterial: 248 mmHg — ABNORMAL HIGH (ref 80.0–100.0)

## 2012-08-01 LAB — AMMONIA: Ammonia: 10 umol/L — ABNORMAL LOW (ref 11–60)

## 2012-08-01 LAB — TROPONIN I: Troponin I: <0.3 ng/mL (ref <0.30)

## 2012-08-01 LAB — POCT I-STAT TROPONIN I: Troponin i, poc: 0.05 ng/mL (ref 0.00–0.08)

## 2012-08-01 MED ORDER — LIDOCAINE HCL (PF) 1 % IJ SOLN: 5.0000 mL | INTRAMUSCULAR | Status: DC | PRN

## 2012-08-01 MED ORDER — LABETALOL HCL 200 MG PO TABS
200.0000 mg | ORAL_TABLET | Freq: Two times a day (BID) | ORAL | Status: DC
  Administered 2012-08-01 – 2012-08-03 (×5): 200 mg via ORAL
  Filled 2012-08-01 (×6): qty 1

## 2012-08-01 MED ORDER — SODIUM POLYSTYRENE SULFONATE 15 GM/60ML PO SUSP
30.0000 g | Freq: Once | ORAL | Status: DC
  Filled 2012-08-01: qty 120

## 2012-08-01 MED ORDER — SODIUM CHLORIDE 0.9 % IJ SOLN
3.0000 mL | Freq: Two times a day (BID) | INTRAMUSCULAR | Status: DC
  Administered 2012-08-01 – 2012-08-02 (×4): 3 mL via INTRAVENOUS

## 2012-08-01 MED ORDER — HEPARIN SODIUM (PORCINE) 1000 UNIT/ML DIALYSIS: 1500.0000 [IU] | INTRAMUSCULAR | Status: DC | PRN

## 2012-08-01 MED ORDER — CALCIUM GLUCONATE 10 % IV SOLN
1.0000 g | Freq: Once | INTRAVENOUS | Status: AC
  Administered 2012-08-01: 1 g via INTRAVENOUS
  Filled 2012-08-01: qty 10

## 2012-08-01 MED ORDER — PENTAFLUOROPROP-TETRAFLUOROETH EX AERO: 1.0000 "application " | INHALATION_SPRAY | CUTANEOUS | Status: DC | PRN

## 2012-08-01 MED ORDER — SODIUM CHLORIDE 0.9 % IV SOLN: 100.0000 mL | INTRAVENOUS | Status: DC | PRN

## 2012-08-01 MED ORDER — RENA-VITE PO TABS
1.0000 | ORAL_TABLET | Freq: Every day | ORAL | Status: DC
  Administered 2012-08-01 – 2012-08-03 (×3): 1 via ORAL
  Filled 2012-08-01 (×3): qty 1

## 2012-08-01 MED ORDER — INSULIN ASPART 100 UNIT/ML ~~LOC~~ SOLN
5.0000 [IU] | Freq: Once | SUBCUTANEOUS | Status: AC
  Administered 2012-08-01: 5 [IU] via SUBCUTANEOUS
  Filled 2012-08-01: qty 1

## 2012-08-01 MED ORDER — LIDOCAINE-PRILOCAINE 2.5-2.5 % EX CREA: 1.0000 "application " | TOPICAL_CREAM | CUTANEOUS | Status: DC | PRN

## 2012-08-01 MED ORDER — NEPRO/CARBSTEADY PO LIQD
237.0000 mL | ORAL | Status: DC | PRN
  Filled 2012-08-01: qty 237

## 2012-08-01 MED ORDER — ALTEPLASE 2 MG IJ SOLR
2.0000 mg | Freq: Once | INTRAMUSCULAR | Status: DC | PRN
  Filled 2012-08-01: qty 2

## 2012-08-01 MED ORDER — HEPARIN SODIUM (PORCINE) 1000 UNIT/ML DIALYSIS: 1000.0000 [IU] | INTRAMUSCULAR | Status: DC | PRN

## 2012-08-01 MED ORDER — CINACALCET HCL 30 MG PO TABS
30.0000 mg | ORAL_TABLET | Freq: Every day | ORAL | Status: DC
  Administered 2012-08-01 – 2012-08-03 (×3): 30 mg via ORAL
  Filled 2012-08-01 (×4): qty 1

## 2012-08-01 MED ORDER — DEXTROSE 50 % IV SOLN
50.0000 mL | Freq: Once | INTRAVENOUS | Status: AC
  Administered 2012-08-01: 50 mL via INTRAVENOUS
  Filled 2012-08-01: qty 50

## 2012-08-01 MED ORDER — HEPARIN SODIUM (PORCINE) 5000 UNIT/ML IJ SOLN
5000.0000 [IU] | Freq: Three times a day (TID) | INTRAMUSCULAR | Status: DC
  Administered 2012-08-01 – 2012-08-03 (×5): 5000 [IU] via SUBCUTANEOUS
  Filled 2012-08-01 (×9): qty 1

## 2012-08-01 MED ORDER — SEVELAMER CARBONATE 800 MG PO TABS
800.0000 mg | ORAL_TABLET | Freq: Three times a day (TID) | ORAL | Status: DC
  Administered 2012-08-01 – 2012-08-03 (×5): 800 mg via ORAL
  Filled 2012-08-01 (×9): qty 1

## 2012-08-01 MED ORDER — NITROGLYCERIN IN D5W 200-5 MCG/ML-% IV SOLN
10.0000 ug/min | INTRAVENOUS | Status: DC
  Administered 2012-08-01: 10 ug/min via INTRAVENOUS
  Filled 2012-08-01: qty 250

## 2012-08-01 MED ORDER — DIALYVITE 800 0.8 MG PO TABS: 800.0000 mg | ORAL_TABLET | Freq: Every morning | ORAL | Status: DC

## 2012-08-01 MED ORDER — CINACALCET HCL 30 MG PO TABS
30.0000 mg | ORAL_TABLET | Freq: Every day | ORAL | Status: DC
  Filled 2012-08-01: qty 1

## 2012-08-01 MED ORDER — COLCHICINE 0.6 MG PO TABS
0.6000 mg | ORAL_TABLET | Freq: Every day | ORAL | Status: DC
  Administered 2012-08-01 – 2012-08-03 (×3): 0.6 mg via ORAL
  Filled 2012-08-01 (×3): qty 1

## 2012-08-01 MED ORDER — SODIUM BICARBONATE 8.4 % IV SOLN
50.0000 meq | Freq: Once | INTRAVENOUS | Status: AC
  Administered 2012-08-01: 50 meq via INTRAVENOUS
  Filled 2012-08-01: qty 50

## 2012-08-01 MED ORDER — AMLODIPINE BESYLATE 10 MG PO TABS
10.0000 mg | ORAL_TABLET | Freq: Every day | ORAL | Status: DC
  Administered 2012-08-01 – 2012-08-03 (×3): 10 mg via ORAL
  Filled 2012-08-01 (×3): qty 1

## 2012-08-01 MED ORDER — PANTOPRAZOLE SODIUM 40 MG PO TBEC
40.0000 mg | DELAYED_RELEASE_TABLET | Freq: Every day | ORAL | Status: DC
  Administered 2012-08-01 – 2012-08-03 (×3): 40 mg via ORAL
  Filled 2012-08-01 (×3): qty 1

## 2012-08-01 NOTE — Progress Notes (Signed)
Patient removed from BiPAP and placed on 50% venturi mask.

## 2012-08-01 NOTE — ED Notes (Signed)
I-STAT CHEM8+ NA 126 K >9.0 CL 98 CA 0.76 TCO2 28 GLU 129 BUN 85 CREA 8.7 HCT 39% HB 13.3

## 2012-08-01 NOTE — ED Notes (Signed)
Verbal order per Dr. Conley Rolls to d/c BiPap and place patient on Face Mask @ 40%. Resp therapy notified of this order and at bedside

## 2012-08-01 NOTE — ED Notes (Signed)
CRITICAL VALUE ALERT  Critical value received:  K+ 6.4   Date of notification:  08/01/2012  Time of notification:  0511  Critical value read back:yes  Nurse who received alert:  Lurline Idol   MD notified: Dr. Conley Rolls @ [phone]

## 2012-08-01 NOTE — ED Provider Notes (Signed)
History    CSN: [id number] Arrival date & time 08/01/12  0341  First MD Initiated Contact with Patient 08/01/12 [id number]     Chief Complaint  Patient presents with  . Respiratory Distress   (Consider location/radiation/quality/duration/timing/severity/associated sxs/prior Treatment) The history is provided by the patient. The history is limited by the condition of the patient.  Roger Gibson is a 67 y.o. male hx of ESRD on HD (Tues, Thur, Sat) last dialyzed 3 days ago, HL, HTN here with SOB. SOB while in bed this AM. He has been having dry cough for 2 days. EMS noted that he was pale and diaphoretic with accessory muscle use. O2 sat in the 70s. Given albuterol and atrovent and placed on Bipap by EMS.   Level V caveat- SOB on bipap  Past Medical History  Diagnosis Date  . Iron deficiency anemia     PRE 2004/06/2000  . Gout 01/2002    Right knee; Left great toe  . HLD (hyperlipidemia) 1999  . HTN (hypertension) 1974  . Confusion     during admission to [facility] 2012, persisted after discharge  . Renal cysts, acquired, bilateral     per [employer]  . ESRD (end stage renal disease) on dialysis     T, Th, Sat HD in [place], [address]  . ESRD (end stage renal disease)   . Allergy   . Blood transfusion without reported diagnosis   . Cancer     right kidney ca   Past Surgical History  Procedure Laterality Date  . Nephrectomy  1965    [place]  . Achilles tendon repair  [address] [place]  . Mr Maxine Glenn abdomen  2000    3x3 mass left kidney  . Nephrectomy  2000    partial-(2nd to renal carcinoma) Dr. Margo Aye [facility]  . Colonoscopy  05/15/03    Polyp (biopsy negative) repeat in 5 years  . Ct abd w & pelvis wo cm  03/03/06    Right kidney absent; Left with cystic changes  . Cystoscopy  03/03/06    Mild to mod. BPH observed/ otherwise normal  . Kidney transplant  07/02/2006    deceased donor-RLQ Abd, partial neph for renal cell  CA/complete Nephrectomy for infection ([facility])  . Colonoscopy  07/18/08    1 rectal polyp (Dr. Laural Benes) 5 years   Family History  Problem Relation Age of Onset  . Stomach cancer Father     ?  Marland Kitchen Hypertension Mother     On dialysis for years  . Hypertension Brother   . Hypertension Brother   . Hypertension Brother   . Hypertension Sister   . Breast cancer Sister     s/p mastectomy  . COPD Sister   . Hypertension Sister   . Kidney failure Sister     on dialysis  . Heart attack Paternal Grandfather   . Colon cancer Neg Hx   . Esophageal cancer Neg Hx   . Rectal cancer Neg Hx    History  Substance Use Topics  . Smoking status: Former Smoker    Types: Cigarettes, Cigars    Quit date: 01/25/1998  . Smokeless tobacco: Not on file     Comment: occasional cigar  . Alcohol Use: Yes     Comment: very rare    Review of Systems  Respiratory: Positive for shortness of breath.   All other systems reviewed and are negative.  Allergies  Iodine and Tape  Home Medications   Current Outpatient Rx  Name  Route  Sig  Dispense  Refill  . amLODipine (NORVASC) 10 MG tablet   Oral   Take 10 mg by mouth daily.         . B Complex-C-Folic Acid (DIALYVITE 800 PO)   Oral   Take 800 mg by mouth daily.         . cinacalcet (SENSIPAR) 30 MG tablet   Oral   Take 30 mg by mouth daily.         . colchicine (COLCRYS) 0.6 MG tablet   Oral   Take 0.6 mg by mouth daily.         . diphenhydrAMINE (BENADRYL) 25 mg capsule   Oral   Take 25 mg by mouth daily as needed for itching or allergies.         Marland Kitchen labetalol (NORMODYNE) 200 MG tablet   Oral   Take 200 mg by mouth 2 (two) times daily.         Marland Kitchen lisinopril (PRINIVIL,ZESTRIL) 20 MG tablet   Oral   Take 20 mg by mouth daily.         Marland Kitchen omeprazole (PRILOSEC) 20 MG capsule   Oral   Take 20 mg by mouth daily.         . predniSONE (DELTASONE) 50 MG tablet   Oral   Take 50 mg by mouth 3 (three) times daily. Take 1  tablet 13 hours prior to procedure, 1 tablet 7 hours prior to procedure, and 1 tablet 1 hour prior to procedure         . sevelamer (RENAGEL) 800 MG tablet   Oral   Take 800-2,400 mg by mouth 3 (three) times daily with meals. Take 3 tablets in the morning, 1 tablet at lunch, and 3 tablets with dinner          BP 145/80  Pulse 89  Temp(Src) 98.7 F (37.1 C) (Axillary)  Resp 26  SpO2 100% Physical Exam  Nursing note and vitals reviewed. Constitutional:  Tachypneic, on bipap. Some respiratory distress   HENT:  Head: Normocephalic.  Eyes: Conjunctivae are normal. Pupils are equal, round, and reactive to light.  Neck: Normal range of motion.  Cardiovascular: Regular rhythm and normal heart sounds.   Tachycardic   Pulmonary/Chest:  Tachypneic, rales half way up, no wheezing   Abdominal: Soft. Bowel sounds are normal.  Musculoskeletal: Normal range of motion.  1+ edema bilateral legs   Neurological: He is alert.  Skin: Skin is warm and dry.  Psychiatric:  Unable     ED Course  Procedures (including critical care time)  CRITICAL CARE Performed by: Silverio Lay, Arryanna Holquin   Total critical care time: 30 min   Critical care time was exclusive of separately billable procedures and treating other patients.  Critical care was necessary to treat or prevent imminent or life-threatening deterioration.  Critical care was time spent personally by me on the following activities: development of treatment plan with patient and/or surrogate as well as nursing, discussions with consultants, evaluation of patient's response to treatment, examination of patient, obtaining history from patient or surrogate, ordering and performing treatments and interventions, ordering and review of laboratory studies, ordering and review of radiographic studies, pulse oximetry and re-evaluation of patient's condition.   Labs Reviewed  CBC WITH DIFFERENTIAL - Abnormal; Notable for the following:    RBC 3.83 (*)     Hemoglobin 10.2 (*)  HCT 32.1 (*)    RDW 20.8 (*)    All other components within normal limits  POCT I-STAT 3, BLOOD GAS (G3+) - Abnormal; Notable for the following:    pH, Arterial 7.521 (*)    pO2, Arterial 248.0 (*)    Bicarbonate 33.6 (*)    Acid-Base Excess 10.0 (*)    All other components within normal limits  PRO B NATRIURETIC PEPTIDE  BASIC METABOLIC PANEL  RENAL FUNCTION PANEL  RENAL FUNCTION PANEL  POCT I-STAT TROPONIN I   Dg Chest Portable 1 View  08/01/2012   *RADIOLOGY REPORT*  Clinical Data: Respiratory distress.  Shortness of breath.  PORTABLE CHEST - 1 VIEW  Comparison: 06/27/2011.  Findings: Cardiac enlargement with mild pulmonary vascular congestion.  Bilateral perihilar and lower lobe airspace disease. This could be due to pneumonia, edema, or other infiltrative process.  No blunting of costophrenic angles.  No pneumothorax. Calcification of the aorta.  Stent graft in the upper mediastinum.  IMPRESSION: Cardiac enlargement and pulmonary vascular congestion with airspace disease in the lungs suggesting edema or pneumonia.   Original Report Authenticated By: Burman Nieves, M.D.   1. Respiratory failure   2. Fluid overload   3. Hyperkalemia     Date: 08/01/2012  Rate: 119  Rhythm: sinus tachycardia  QRS Axis: normal  Intervals: normal  ST/T Wave abnormalities: ST depression laterally, new since previous  Conduction Disutrbances:none  Narrative Interpretation:   Old EKG Reviewed: changes noted    MDM  KENDRY PFARR is a 67 y.o. male here with SOB. Likely pulmonary edema from fluid overload (he is due for dialysis today). Will continue bipap. CXR showed heart failure. Istat chem 8 showed K > 9.0 so I ordered meds for him. I also called Dr. Leodis Binet, who will call in dialysis nurse to get him urgent dialysis. Will admit under Dr. Conley Rolls.    Richardean Canal, MD 08/01/12 [phone]

## 2012-08-01 NOTE — ED Notes (Signed)
Patient transported to dialysis and report given to RN. Patient will be transported to 6700 after dialysis. Report given to Vickie, RN on 6700 prior to patient transport to dialysis.

## 2012-08-01 NOTE — H&P (Signed)
[facility] History and Physical  Roger Gibson ZOX:[id number] DOB: January 15, 1946    PCP:   Irena Cords, MD   Chief Complaint: respiratory distress.  HPI: Roger Gibson is an 67 y.o. male with hx of bilateral nephrectomy (renal cell carcinoma), s/p failed kidney transplant, ESRD on HD (Tues, Thrs, Sat), HTN, presents to the ER in respiratory extremis.  He denied chest pain, fever, chills, but admitted to coughing when lying down.  He was placed on Bipap, and was found to be volume overloaded, with hypertensive response.  Work up included an original K from [facility] to be > 9.0 mEq/L, with EKG showing no peaked T waves or widen QRS.  He was given IV Calcium, Dextrose and Insulin, but no Kayexalate.  Nephrology was consulted by EDP, and planned immediate dialysis.  He was started on IV NTG drip as well and felt markedly better.  Repeat K was found to be 6.4 mEq/L with Cr of 9.59.  I removed his Bipap, and cancelled his Kayexalate.  He is now breathing comfortably and hospitalist was asked to admit him for hyperkalemia, pulmonary edema requiring urgent dialysis.  Rewiew of Systems:  Constitutional: Negative for malaise, fever and chills. No significant weight loss or weight gain Eyes: Negative for eye pain, redness and discharge, diplopia, visual changes, or flashes of light. ENMT: Negative for ear pain, hoarseness, nasal congestion, sinus pressure and sore throat. No headaches; tinnitus, drooling, or problem swallowing. Cardiovascular: Negative for chest pain, palpitations, diaphoresis,  PND Respiratory: Negative for  hemoptysis, wheezing and stridor. No pleuritic chestpain. Gastrointestinal: Negative for nausea, vomiting, diarrhea, constipation, abdominal pain, melena, blood in stool, hematemesis, jaundice and rectal bleeding.    Genitourinary: Negative for frequency, dysuria, incontinence,flank pain and hematuria; Musculoskeletal: Negative for back pain and neck pain. Negative for  swelling and trauma.;  Skin: . Negative for pruritus, rash, abrasions, bruising and skin lesion.; ulcerations Neuro: Negative for headache, lightheadedness and neck stiffness. Negative for weakness, altered level of consciousness , altered mental status, extremity weakness, burning feet, involuntary movement, seizure and syncope.  Psych: negative for anxiety, depression, insomnia, tearfulness, panic attacks, hallucinations, paranoia, suicidal or homicidal ideation    Past Medical History  Diagnosis Date  . Iron deficiency anemia     PRE 2004/06/2000  . Gout 01/2002    Right knee; Left great toe  . HLD (hyperlipidemia) 1999  . HTN (hypertension) 1974  . Confusion     during admission to [facility] 2012, persisted after discharge  . Renal cysts, acquired, bilateral     per [employer] [facility]  . ESRD (end stage renal disease) on dialysis     T, Th, Sat HD in [place], [address]  . ESRD (end stage renal disease)   . Allergy   . Blood transfusion without reported diagnosis   . Cancer     right kidney ca    Past Surgical History  Procedure Laterality Date  . Nephrectomy  1965    Left (infection) [place]  . Achilles tendon repair  [address], [place]  . Mr Maxine Glenn abdomen  2000    3x3 mass left kidney  . Nephrectomy  2000    partial-(2nd to renal carcinoma) Dr. Margo Aye [facility]  . Colonoscopy  05/15/03    Polyp (biopsy negative) repeat in 5 years  . Ct abd w & pelvis wo cm  03/03/06    Right kidney absent; Left with cystic changes  . Cystoscopy  03/03/06  Mild to mod. BPH observed/ otherwise normal  . Kidney transplant  19-Jul-2006    deceased donor-RLQ Abd, partial neph for renal cell CA/complete Nephrectomy for infection ([facility])  . Colonoscopy  07/18/08    1 rectal polyp (Dr. Laural Benes) 5 years    Medications:  HOME MEDS: Prior to Admission medications   Medication Sig Start Date End Date Taking? Authorizing Provider  amLODipine (NORVASC) 10 MG tablet  Take 10 mg by mouth daily.   Yes Historical Provider, MD  B Complex-C-Folic Acid (DIALYVITE 800 PO) Take 800 mg by mouth daily.   Yes Historical Provider, MD  cinacalcet (SENSIPAR) 30 MG tablet Take 30 mg by mouth daily.   Yes Historical Provider, MD  colchicine (COLCRYS) 0.6 MG tablet Take 0.6 mg by mouth daily.   Yes Historical Provider, MD  labetalol (NORMODYNE) 200 MG tablet Take 200 mg by mouth 2 (two) times daily.   Yes Historical Provider, MD  lisinopril (PRINIVIL,ZESTRIL) 40 MG tablet Take 40 mg by mouth daily.   Yes Historical Provider, MD  omeprazole (PRILOSEC) 20 MG capsule Take 20 mg by mouth daily.   Yes Historical Provider, MD  sevelamer (RENAGEL) 800 MG tablet Take 2,400 mg by mouth 3 (three) times daily with meals.    Yes Historical Provider, MD     Allergies:  Allergies  Allergen Reactions  . Iodine Swelling  . Tape Rash    Adhesive tape     Social History:   reports that he quit smoking about 14 years ago. His smoking use included Cigarettes and Cigars. He smoked 0.00 packs per day. He does not have any smokeless tobacco history on file. He reports that  drinks alcohol. He reports that he does not use illicit drugs.  Family History: Family History  Problem Relation Age of Onset  . Stomach cancer Father     ?  Marland Kitchen Hypertension Mother     On dialysis for years  . Hypertension Brother   . Hypertension Brother   . Hypertension Brother   . Hypertension Sister   . Breast cancer Sister     s/p mastectomy  . COPD Sister   . Hypertension Sister   . Kidney failure Sister     on dialysis  . Heart attack Paternal Grandfather   . Colon cancer Neg Hx   . Esophageal cancer Neg Hx   . Rectal cancer Neg Hx      Physical Exam: Filed Vitals:   08/01/12 0345 08/01/12 0412 08/01/12 0415 08/01/12 0445  BP: 204/113 169/91 144/93 145/80  Pulse: 107 100 89 89  Temp: 98.7 F (37.1 C)     TempSrc: Axillary     Resp:  39 33 26  SpO2: 91% 100% 100% 100%   Blood pressure  145/80, pulse 89, temperature 98.7 F (37.1 C), temperature source Axillary, resp. rate 26, SpO2 100.00%.  GEN:  Pleasant  patient lying in the stretcher in no acute distress; cooperative with exam. PSYCH:  alert and oriented x4; does not appear anxious or depressed; affect is appropriate. HEENT: Mucous membranes pink and anicteric; PERRLA; EOM intact; no cervical lymphadenopathy nor thyromegaly or carotid bruit; no JVD; There were no stridor. Neck is very supple. Breasts:: Not examined CHEST WALL: No tenderness CHEST: Normal respiration, clear to auscultation bilaterally.  HEART: Regular rate and rhythm.  There are no murmur, rub, or gallops.   BACK: No kyphosis or scoliosis; no CVA tenderness ABDOMEN: soft and non-tender; no masses, no organomegaly, normal abdominal bowel sounds; no  pannus; no intertriginous candida. There is no rebound and no distention. Rectal Exam: Not done EXTREMITIES: No bone or joint deformity; age-appropriate arthropathy of the hands and knees; 1-2 + bilateral edema. no ulcerations.  There is no calf tenderness.  He has a AVF on left upper arm, with thrills. Genitalia: not examined PULSES: 2+ and symmetric SKIN: Normal hydration no rash or ulceration CNS: Cranial nerves 2-12 grossly intact no focal lateralizing neurologic deficit.  Speech is fluent; uvula elevated with phonation, facial symmetry and tongue midline. DTR are normal bilaterally, cerebella exam is intact, barbinski is negative and strengths are equaled bilaterally.  No sensory loss.   Labs on Admission:  Basic Metabolic Panel:  Recent Labs Lab 08/01/12 0357  NA 133*  133*  K 6.4*  6.4*  CL 91*  91*  CO2 27  27  GLUCOSE 133*  133*  BUN 48*  49*  CREATININE 9.59*  9.44*  CALCIUM 9.0  8.9  PHOS 3.9   Liver Function Tests:  Recent Labs Lab 08/01/12 0357  ALBUMIN 3.1*   No results found for this basename: LIPASE, AMYLASE,  in the last 168 hours No results found for this basename:  AMMONIA,  in the last 168 hours CBC:  Recent Labs Lab 08/01/12 0357  WBC 9.7  NEUTROABS 7.3  HGB 10.2*  HCT 32.1*  MCV 83.8  PLT 348   Cardiac Enzymes: No results found for this basename: CKTOTAL, CKMB, CKMBINDEX, TROPONINI,  in the last 168 hours  CBG: No results found for this basename: GLUCAP,  in the last 168 hours   Radiological Exams on Admission: Dg Chest Portable 1 View  08/01/2012   *RADIOLOGY REPORT*  Clinical Data: Respiratory distress.  Shortness of breath.  PORTABLE CHEST - 1 VIEW  Comparison: 06/27/2011.  Findings: Cardiac enlargement with mild pulmonary vascular congestion.  Bilateral perihilar and lower lobe airspace disease. This could be due to pneumonia, edema, or other infiltrative process.  No blunting of costophrenic angles.  No pneumothorax. Calcification of the aorta.  Stent graft in the upper mediastinum.  IMPRESSION: Cardiac enlargement and pulmonary vascular congestion with airspace disease in the lungs suggesting edema or pneumonia.   Original Report Authenticated By: Burman Nieves, M.D.    EKG: Independently reviewed.  SR with no peaked T's. QTc 460, non specific T wave inversion in precordial lead.   Assessment/Plan Present on Admission:  . Hyperkalemia . Pulmonary edema . HYPERTENSION . HYPERLIPIDEMIA ESRD on HD  PLAN:  He is doing better now, so will d/c Bipap and place him on VM at 50 %.  His NTG can be discontinued as well.  He needs to get his dialysis.  He has hyperkalemia, and will be dialyzed shortly.  I have held his ACE-I, and will defer to nephrologist as to when to restart him on it.  He is stable, full code, and will be admitted to [facility] under OBS, mainly for the required dialysis.  His home meds were continued.  Thank you for allowing me to participate in the care of this nice gentleman.  Other plans as per orders.  Code Status: FULL Unk Lightning, MD. [facility] Pager [phone] 7pm to 7am.  08/01/2012, 5:21 AM

## 2012-08-01 NOTE — ED Notes (Signed)
Family at bedside. 

## 2012-08-01 NOTE — Progress Notes (Signed)
[facility] PROGRESS NOTE  [name]:[id number] DOB: [birth year] DOA: 08/01/2012 PCP: Irena Cords, MD  Assessment/Plan  Severe fatigue and somnolence and confusion for the last 2 weeks.  May be due to hypovolemia from having a change in his dry weight at dialysis as I recognized. He may also have had some ischemia with resultant CHF. His proBNP was very elevated and he has bilateral pulmonary edema and small effusions.  ABG demonstrates he was hypercapnic. Possibly he has some congestive hepatitis or may be an elevated ammonia level to explain the somnolence.  Pneumonia seems less likely given lack of fevers and leukocytosis.  We'll check a calcitonin and if elevated, start antibiotics. -  Ammonia level -  Strict I/O -  Minimize sedating medications -  Telemetry -  Troponins -  ECHO -  TSH -  Cortisol level -  Procalcitonin -  PT evaluation  Hypervolemia and hyperkalemia.  The patient and his wife, he has been receiving hemodialysis without problems for the last several weeks. He hasn't had any changes to the amount of fluid that he's been drinking or his salt and potassium intake. No changes to his diet even over the holiday. -  Status post hemodialysis -  Repeat chest x-ray, per my interpretation, appears to be improved. -  Repeat BMP to check potassium level -  Stable on room air currently  ESRD, on HD  HTN/HLD,  Hypotensive slightly during HD.  -  BP medication management per nephrology -  Not on cholesterol medication  Diet:  Renal dialysis Access:  PIV  IVF:  off Proph:  heparin  Code Status: full code Family Communication: Patient and his wife Disposition Plan: Pending PT consultation   Consultants:  Nephrology  Procedures:  Dialysis  Antibiotics:  None   HPI/Subjective:  Patient states that his breathing is much better at present he was placed on a BiPAP machine yesterday in the emergency department and is even better after dialysis  today.  He denies acute complaints. His wife states that for the last couple of weeks he had increased lower charity swelling, confusion, somnolence, and weakness. He states that he is mostly been sitting around in the chair all day for the last several days.  Objective: Filed Vitals:   08/01/12 0945 08/01/12 1000 08/01/12 1019 08/01/12 1055  BP: 117/67 104/56 139/64 112/65  Pulse: 95 94 91 78  Temp:   97.5 F (36.4 C) 97.3 F (36.3 C)  TempSrc:   Oral Oral  Resp:   21 20  SpO2:   93% 95%    Intake/Output Summary (Last 24 hours) at 08/01/12 1414 Last data filed at 08/01/12 1019  Gross per 24 hour  Intake      0 ml  Output   3547 ml  Net  -3547 ml   Filed Weights    Exam:   General:  African American male,  No acute distress  HEENT:  NCAT, MMM, JVP to the preauricular area  Cardiovascular:  RRR, nl S1, S2 no mrg, 2+ pulses, warm extremities  Respiratory:  Rales at the bilateral bases, no wheezes, no rhonchi, no increased WOB  Abdomen:   NABS, soft, NT/ND  MSK:  Normal tone and bulk, 1+ LEE  Neuro:  Grossly intact  Psych:  Quickly dropped off to sleep.  Easily arousable again.  Data Reviewed: Basic Metabolic Panel:  Recent Labs Lab 08/01/12 0357  NA 133*  133*  K 6.4*  6.4*  CL 91*  91*  CO2 27  27  GLUCOSE 133*  133*  BUN 48*  49*  CREATININE 9.59*  9.44*  CALCIUM 9.0  8.9  PHOS 3.9   Liver Function Tests:  Recent Labs Lab 08/01/12 0357  ALBUMIN 3.1*   No results found for this basename: LIPASE, AMYLASE,  in the last 168 hours No results found for this basename: AMMONIA,  in the last 168 hours CBC:  Recent Labs Lab 08/01/12 0357  WBC 9.7  NEUTROABS 7.3  HGB 10.2*  HCT 32.1*  MCV 83.8  PLT 348   Cardiac Enzymes:  Recent Labs Lab 08/01/12 1330  TROPONINI <0.30   BNP (last 3 results)  Recent Labs  08/01/12 0357  PROBNP >70000.0*   CBG: No results found for this basename: GLUCAP,  in the last 168 hours  No results  found for this or any previous visit (from the past 240 hour(s)).   Studies: Dg Chest Portable 1 View  08/01/2012   *RADIOLOGY REPORT*  Clinical Data: Respiratory distress.  Shortness of breath.  PORTABLE CHEST - 1 VIEW  Comparison: 06/27/2011.  Findings: Cardiac enlargement with mild pulmonary vascular congestion.  Bilateral perihilar and lower lobe airspace disease. This could be due to pneumonia, edema, or other infiltrative process.  No blunting of costophrenic angles.  No pneumothorax. Calcification of the aorta.  Stent graft in the upper mediastinum.  IMPRESSION: Cardiac enlargement and pulmonary vascular congestion with airspace disease in the lungs suggesting edema or pneumonia.   Original Report Authenticated By: Burman Nieves, M.D.    Scheduled Meds: . amLODipine  10 mg Oral Daily  . cinacalcet  30 mg Oral Q breakfast  . colchicine  0.6 mg Oral Daily  . heparin  5,000 Units Subcutaneous Q8H  . labetalol  200 mg Oral BID  . multivitamin  1 tablet Oral Daily  . pantoprazole  40 mg Oral Daily  . sevelamer carbonate  800 mg Oral TID WC  . sodium chloride  3 mL Intravenous Q12H   Continuous Infusions:   Principal Problem:   Pulmonary edema Active Problems:   HYPERLIPIDEMIA   HYPERTENSION   ESRD on hemodialysis   Hyperkalemia    Time spent: 30 min    Roger Gibson, [facility]  [facility] Pager [phone]. If 7PM-7AM, please contact night-coverage at [email or web address], password [facility] 08/01/2012, 2:14 PM  LOS: 0 days

## 2012-08-01 NOTE — Progress Notes (Signed)
Pt arrived to the unit with dx. Sob and Hyperkalemia. Pt is alert an oriented x4. Ambulatory with stand by assist. Denies any pain or discomfort. Pt was placed on telemetry and vital signs were stable. Pt oriented to staff and unit. Will cont to monitor.

## 2012-08-01 NOTE — ED Notes (Signed)
Patient from home with cc: respiratory distress. Per wife, patient has had a dry cough and SOB x 2 days. SOB has progressively gotten worse especially tonight. Patient has dialysis T-Th-Sa. Last complete treatment Saturday. At EMS arrival, patient was pale and diaphoretic. RR 40s. SPO2 70s. Accessory muscle usage. Speaking in short choppy sentences. CPAP applied. Albuterol 5 mg x 1 given. At EMS arrival, Albuterol 5 mg/Atrovent 0.5 mg in process. No IV access. 12 lead unremarkable. BP 130s systolic.

## 2012-08-01 NOTE — Progress Notes (Signed)
Hemodialysis-Pt received from the ED in mild resp distress. On Venti mask. 3.5L UF with HD on low K bath. Tolerated well. BP drop during last 30 minutes of treatment. Unable to meet 4L goal. Pt has no complaints post treatment other than sore from sitting on stretcher. Unable to obtain post weight d/t weakness. BP stable at 137/67. Transported to 6700 in no distress. Report called to RN on 6700

## 2012-08-01 NOTE — ED Notes (Signed)
Admitting MD at bedside.

## 2012-08-01 NOTE — Consult Note (Signed)
[name]  CONSULTATION  Roger Gibson is an 67 y.o. male.    Chief Complaint: SOB Reason for Consultation:  Evaluation and management of hyperkalemia, volume overload, and ESRD related medical issues [id number] Roger Gibson is an 67 y.o. AAM  with PMH sig for bilateral nephrectomy (renal cell carcinoma), s/p failed kidney transplant, ESRD on HD (Tues, Thrs, Sat), HTN, presented to the ER after waking up from sleep with severe respiratory distress.  EMS was called and brought to [facility]. He denied chest pain, fever, chills, but has had a cough that started 2 days PTA. He was placed on Bipap, and was found to have pulmonary edema and new ST depression on EKG. Work up included an Istat potassium of > 9.0 mEq/L, without peaked T waves or widen QRS on EKG. He was given IV Calcium, Dextrose and Insulin, but no Kayexalate.  He was also started on IV NTG drip. Repeat K was found to be 6.4 mEq/L with Cr of 9.59. He has been taken off of Bipap and is breathing better.  We were consulted to evaluate his hyperkalemia and pulmonary edema and arrange for emergent HD.  He denies any issues with his HD on Saturday and did not feel he had a lot of fluid on over the weekend.  After review of outpt records, he was only 1kg above [name] and left 0.3kg below his [name] on Saturday.   PMH: Past Medical History  Diagnosis Date  . Iron deficiency anemia     PRE 2004/06/2000  . Gout 01/2002    Right knee; Left great toe  . HLD (hyperlipidemia) 1999  . HTN (hypertension) 1974  . Confusion     during admission to [facility] 2012, persisted after discharge  . Renal cysts, acquired, bilateral     per [employer]  . ESRD (end stage renal disease) on dialysis     T, Th, Sat HD in [place], [address]  . ESRD (end stage renal disease)   . Allergy   . Blood transfusion without reported diagnosis   . Cancer     right kidney ca   PSH: Past Surgical History  Procedure Laterality Date   . Nephrectomy  1965    Left (infection) [place]  . Achilles tendon repair  [address] [place]  . Mr Maxine Glenn abdomen  2000    3x3 mass left kidney  . Nephrectomy  2000    partial-(2nd to renal carcinoma) Dr. Margo Aye [facility]  . Colonoscopy  05/15/03    Polyp (biopsy negative) repeat in 5 years  . Ct abd w & pelvis wo cm  03/03/06    Right kidney absent; Left with cystic changes  . Cystoscopy  03/03/06    Mild to mod. BPH observed/ otherwise normal  . Kidney transplant  07/20/2006    deceased donor-RLQ Abd, partial neph for renal cell CA/complete Nephrectomy for infection ([facility])  . Colonoscopy  07/18/08    1 rectal polyp (Dr. Laural Benes) 5 years    DIALYSIS: Dialyzes at [facility] on TTS. Primary Nephrologist Deterding. [name] 75.5kg. HD Bath 2K, 2.5 Ca, Dialyzer 180 Nre, Heparin 7,000. Access LUE AVF. Hectorol , Epogen 28000, Venofer 100mg   Past Medical History  Diagnosis Date  . Iron deficiency anemia     PRE 2004/06/2000  . Gout 01/2002    Right knee; Left great toe  . HLD (hyperlipidemia) 1999  . HTN (hypertension) 1974  . Confusion  during admission to [facility] 2012, persisted after discharge  . Renal cysts, acquired, bilateral     per [employer]  . ESRD (end stage renal disease) on dialysis     T, Th, Sat HD in [place], [address]  . ESRD (end stage renal disease)   . Allergy   . Blood transfusion without reported diagnosis   . Cancer     right kidney ca    Medications:  I have reviewed the patient's current medications.  Medications Prior to Admission  Medication Sig Dispense Refill  . amLODipine (NORVASC) 10 MG tablet Take 10 mg by mouth daily.      . B Complex-C-Folic Acid (DIALYVITE 800 PO) Take 800 mg by mouth daily.      . cinacalcet (SENSIPAR) 30 MG tablet Take 30 mg by mouth daily.      . colchicine (COLCRYS) 0.6 MG tablet Take 0.6 mg by mouth daily.      Marland Kitchen labetalol (NORMODYNE) 200 MG tablet Take 200 mg by mouth 2 (two)  times daily.      Marland Kitchen lisinopril (PRINIVIL,ZESTRIL) 40 MG tablet Take 40 mg by mouth daily.      Marland Kitchen omeprazole (PRILOSEC) 20 MG capsule Take 20 mg by mouth daily.      . sevelamer (RENAGEL) 800 MG tablet Take 2,400 mg by mouth 3 (three) times daily with meals.         ALLERGIES:   Allergies  Allergen Reactions  . Iodine Swelling  . Tape Rash    Adhesive tape     FAM HX: Family History  Problem Relation Age of Onset  . Stomach cancer Father     ?  Marland Kitchen Hypertension Mother     On dialysis for years  . Hypertension Brother   . Hypertension Brother   . Hypertension Brother   . Hypertension Sister   . Breast cancer Sister     s/p mastectomy  . COPD Sister   . Hypertension Sister   . Kidney failure Sister     on dialysis  . Heart attack Paternal Grandfather   . Colon cancer Neg Hx   . Esophageal cancer Neg Hx   . Rectal cancer Neg Hx     Social History:   reports that he quit smoking about 14 years ago. His smoking use included Cigarettes and Cigars. He smoked 0.00 packs per day. He does not have any smokeless tobacco history on file. He reports that  drinks alcohol. He reports that he does not use illicit drugs.  ROS: Review of systems not obtained due to patient factors.  PE: General appearance: moderate distress Head: Normocephalic, without obvious abnormality, atraumatic Eyes: negative findings: lids and lashes normal, conjunctivae and sclerae normal and corneas clear Resp: rales bilaterally Cardio: no rub and tachycardic GI: soft, non-tender; bowel sounds normal; no masses,  no organomegaly Extremities: trace pretib edema, LUE AVF +T/B BMP:  Recent Labs Lab 08/01/12 0357  NA 133*  133*  K 6.4*  6.4*  CL 91*  91*  CO2 27  27  GLUCOSE 133*  133*  BUN 48*  49*  CREATININE 9.59*  9.44*  CALCIUM 9.0  8.9  PHOS 3.9   Liver Function Tests:  Recent Labs Lab 08/01/12 0357  ALBUMIN 3.1*   No results found for this basename: LIPASE, AMYLASE,  in the  last 168 hours No results found for this basename: AMMONIA,  in the last 168 hours CBC:  Recent Labs Lab 08/01/12  0357  WBC 9.7  NEUTROABS 7.3  HGB 10.2*  HCT 32.1*  MCV 83.8  PLT 348   PT/INR: @labrcntip (inr:5) Cardiac Enzymes: No results found for this basename: CKTOTAL, CKMB, CKMBINDEX, TROPONINI,  in the last 168 hours CBG: No results found for this basename: GLUCAP,  in the last 168 hours  Iron Studies: No results found for this basename: IRON, TIBC, TRANSFERRIN, FERRITIN,  in the last 168 hours  Results for orders placed during the hospital encounter of 08/01/12 (from the past 48 hour(s))  POCT I-STAT TROPONIN I     Status: None   Collection Time    08/01/12  3:55 AM      Result Value Range   Troponin i, poc 0.05  0.00 - 0.08 ng/mL   Comment 3            Comment: Due to the release kinetics of cTnI,     a negative result within the first hours     of the onset of symptoms does not rule out     myocardial infarction with certainty.     If myocardial infarction is still suspected,     repeat the test at appropriate intervals.  CBC WITH DIFFERENTIAL     Status: Abnormal   Collection Time    08/01/12  3:57 AM      Result Value Range   WBC 9.7  4.0 - 10.5 K/uL   RBC 3.83 (*) 4.22 - 5.81 MIL/uL   Hemoglobin 10.2 (*) 13.0 - 17.0 g/dL   HCT 86.5 (*) 78.4 - 69.6 %   MCV 83.8  78.0 - 100.0 fL   MCH 26.6  26.0 - 34.0 pg   MCHC 31.8  30.0 - 36.0 g/dL   RDW 29.5 (*) 28.4 - 13.2 %   Platelets 348  150 - 400 K/uL   Neutrophils Relative % 75  43 - 77 %   Neutro Abs 7.3  1.7 - 7.7 K/uL   Lymphocytes Relative 19  12 - 46 %   Lymphs Abs 1.8  0.7 - 4.0 K/uL   Monocytes Relative 5  3 - 12 %   Monocytes Absolute 0.5  0.1 - 1.0 K/uL   Eosinophils Relative 1  0 - 5 %   Eosinophils Absolute 0.1  0.0 - 0.7 K/uL   Basophils Relative 0  0 - 1 %   Basophils Absolute 0.0  0.0 - 0.1 K/uL  PRO B NATRIURETIC PEPTIDE     Status: Abnormal   Collection Time    08/01/12  3:57 AM       Result Value Range   Pro B Natriuretic peptide (BNP) >70000.0 (*) 0 - 125 pg/mL  BASIC METABOLIC PANEL     Status: Abnormal   Collection Time    08/01/12  3:57 AM      Result Value Range   Sodium 133 (*) 135 - 145 mEq/L   Potassium 6.4 (*) 3.5 - 5.1 mEq/L   Comment: CRITICAL RESULT CALLED TO, READ BACK BY AND VERIFIED WITH:     MURRILL,S RN 08/01/2012 0511 JORDANS     NO VISIBLE HEMOLYSIS   Chloride 91 (*) 96 - 112 mEq/L   CO2 27  19 - 32 mEq/L   Glucose, Bld 133 (*) 70 - 99 mg/dL   BUN 48 (*) 6 - 23 mg/dL   Creatinine, Ser 4.40 (*) 0.50 - 1.35 mg/dL   Calcium 9.0  8.4 - 10.2 mg/dL   GFR calc non Af Denyse Dago  5 (*) >90 mL/min   GFR calc Af Amer 6 (*) >90 mL/min   Comment:            The eGFR has been calculated     using the CKD EPI equation.     This calculation has not been     validated in all clinical     situations.     eGFR's persistently     <90 mL/min signify     possible Chronic Kidney Disease.  RENAL FUNCTION PANEL     Status: Abnormal   Collection Time    08/01/12  3:57 AM      Result Value Range   Sodium 133 (*) 135 - 145 mEq/L   Potassium 6.4 (*) 3.5 - 5.1 mEq/L   Comment: CRITICAL RESULT CALLED TO, READ BACK BY AND VERIFIED WITH:     MURRILL,S RN 08/01/2012 0511 JORDANS     NO VISIBLE HEMOLYSIS   Chloride 91 (*) 96 - 112 mEq/L   CO2 27  19 - 32 mEq/L   Glucose, Bld 133 (*) 70 - 99 mg/dL   BUN 49 (*) 6 - 23 mg/dL   Creatinine, Ser 4.09 (*) 0.50 - 1.35 mg/dL   Calcium 8.9  8.4 - 81.1 mg/dL   Phosphorus 3.9  2.3 - 4.6 mg/dL   Albumin 3.1 (*) 3.5 - 5.2 g/dL   GFR calc non Af Amer 5 (*) >90 mL/min   GFR calc Af Amer 6 (*) >90 mL/min   Comment:            The eGFR has been calculated     using the CKD EPI equation.     This calculation has not been     validated in all clinical     situations.     eGFR's persistently     <90 mL/min signify     possible Chronic Kidney Disease.  POCT I-STAT 3, BLOOD GAS (G3+)     Status: Abnormal   Collection Time     08/01/12  4:50 AM      Result Value Range   pH, Arterial 7.521 (*) 7.350 - 7.450   pCO2 arterial 41.1  35.0 - 45.0 mmHg   pO2, Arterial 248.0 (*) 80.0 - 100.0 mmHg   Bicarbonate 33.6 (*) 20.0 - 24.0 mEq/L   TCO2 35  0 - 100 mmol/L   O2 Saturation 100.0     Acid-Base Excess 10.0 (*) 0.0 - 2.0 mmol/L   Patient temperature 98.7 F     Collection site RADIAL, ALLEN'S TEST ACCEPTABLE     Drawn by Operator     Sample type ARTERIAL      Dg Chest Portable 1 View  08/01/2012   *RADIOLOGY REPORT*  Clinical Data: Respiratory distress.  Shortness of breath.  PORTABLE CHEST - 1 VIEW  Comparison: 06/27/2011.  Findings: Cardiac enlargement with mild pulmonary vascular congestion.  Bilateral perihilar and lower lobe airspace disease. This could be due to pneumonia, edema, or other infiltrative process.  No blunting of costophrenic angles.  No pneumothorax. Calcification of the aorta.  Stent graft in the upper mediastinum.  IMPRESSION: Cardiac enlargement and pulmonary vascular congestion with airspace disease in the lungs suggesting edema or pneumonia.   Original Report Authenticated By: Burman Nieves, M.D.    Assessment/Plan 1. SOB/Pulmonary edema- plan for urgent HD with UF.   2. Hyperkalemia- plan for emergent HD with low K bath 3. HTN- improving with UF 4. Anemia of chronic disease- will obtain  outpt records and resume ESA dose 5. SHPTH- cont with meds and follow 6. ST- depressions on EKG- r/o for MI and follow 7. IDWG/HTN- may be losing weight and will need to have his [name] adjusted.  Rameen Quinney A 08/01/2012, 7:38 AM

## 2012-08-01 NOTE — Progress Notes (Signed)
  Echocardiogram 2D Echocardiogram has been performed.  Cathie Beams 08/01/2012, 3:23 PM

## 2012-08-01 NOTE — Procedures (Signed)
Patient was seen on dialysis and the procedure was supervised. BFR 400 Via LUE AVF BP is 158/84.  Patient appears to be tolerating treatment well and is breathing much better.

## 2012-08-01 NOTE — ED Notes (Signed)
CRITICAL VALUE ALERT  Critical value received:  K > 9  Date of notification: 08/01/12  Time of notification:  0359  Critical value read back:yes  Nurse who received alert: Lurline Idol, RN   MD notified: Dr. Silverio Lay @ [phone]

## 2012-08-02 LAB — RENAL FUNCTION PANEL
CO2: 29 mEq/L (ref 19–32)
Calcium: 9.1 mg/dL (ref 8.4–10.5)
GFR calc Af Amer: 8 mL/min — ABNORMAL LOW (ref >90)
GFR calc non Af Amer: 7 mL/min — ABNORMAL LOW (ref >90)
Phosphorus: 3.3 mg/dL (ref 2.3–4.6)
Sodium: 138 mEq/L (ref 135–145)

## 2012-08-02 MED ORDER — DARBEPOETIN ALFA-POLYSORBATE 150 MCG/0.3ML IJ SOLN
150.0000 ug | INTRAMUSCULAR | Status: DC
Start: 2012-08-03 — End: 2012-08-03
  Filled 2012-08-02: qty 0.3

## 2012-08-02 MED ORDER — DOXERCALCIFEROL 4 MCG/2ML IV SOLN
4.0000 ug | INTRAVENOUS | Status: DC
  Filled 2012-08-02: qty 2

## 2012-08-02 MED ORDER — SODIUM CHLORIDE 0.9 % IV SOLN
62.5000 mg | INTRAVENOUS | Status: DC
  Administered 2012-08-03: 62.5 mg via INTRAVENOUS
  Filled 2012-08-02 (×2): qty 5

## 2012-08-02 NOTE — Progress Notes (Signed)
Subjective:  No cos , washing face standing up at sink/ tolerated hd yesterday  Objective Vital signs in last 24 hours: Filed Vitals:   08/01/12 1457 08/01/12 1800 08/01/12 2044 08/02/12 0514  BP: 104/72 120/70 120/58 144/68  Pulse: 81 95 88 99  Temp: 98.4 F (36.9 C) 98.1 F (36.7 C) 98.9 F (37.2 C) 98.5 F (36.9 C)  TempSrc: Oral Oral Oral Oral  Resp: 20 20 20 20   Height:   5\' 10"  (1.778 m)   Weight:   78.047 kg (172 lb 1 oz)   SpO2: 97% 97% 95% 90%   Weight change:   Intake/Output Summary (Last 24 hours) at 08/02/12 0820 Last data filed at 08/01/12 1836  Gross per 24 hour  Intake    360 ml  Output   3547 ml  Net  -3187 ml   Labs: Basic Metabolic Panel:  Recent Labs Lab 08/01/12 0357 08/01/12 1715 08/02/12 0555  NA 133*  133* 138 138  K 6.4*  6.4* 4.5 4.4  CL 91*  91* 96 97  CO2 27  27 30 29   GLUCOSE 133*  133* 100* 101*  BUN 48*  49* 23 30*  CREATININE 9.59*  9.44* 5.75* 7.31*  CALCIUM 9.0  8.9 8.9 9.1  PHOS 3.9  --  3.3   Liver Function Tests:  Recent Labs Lab 08/01/12 0357 08/02/12 0555  ALBUMIN 3.1* 2.9*    Recent Labs Lab 08/01/12 1715  AMMONIA 10*   CBC:  Recent Labs Lab 08/01/12 0357  WBC 9.7  NEUTROABS 7.3  HGB 10.2*  HCT 32.1*  MCV 83.8  PLT 348   Cardiac Enzymes:  Recent Labs Lab 08/01/12 1330 08/01/12 2035  TROPONINI <0.30 <0.30    Dg Chest Port 1 View  08/01/2012   *RADIOLOGY REPORT*  Clinical Data: Post dialysis, cough  PORTABLE CHEST - 1 VIEW  Comparison: Portable exam 1218 hours compared to 0356 hours  Findings: Enlargement of cardiac silhouette with pulmonary vascular congestion. Bilateral pulmonary infiltrates greatest at right base question edema or infection, favor edema due to the degree of improved aeration in short period of time. Wall stent identified at left brachiocephalic vein. Question small amount of fluid within right fissure. AV graft of poor left arm. No pneumothorax.  IMPRESSION: Enlargement of  cardiac silhouette with pulmonary vascular congestion. Diffuse infiltrates bilaterally, slightly improved, favor edema over infection.   Original Report Authenticated By: Ulyses Southward, M.D.   Dg Chest Portable 1 View  08/01/2012   *RADIOLOGY REPORT*  Clinical Data: Respiratory distress.  Shortness of breath.  PORTABLE CHEST - 1 VIEW  Comparison: 06/27/2011.  Findings: Cardiac enlargement with mild pulmonary vascular congestion.  Bilateral perihilar and lower lobe airspace disease. This could be due to pneumonia, edema, or other infiltrative process.  No blunting of costophrenic angles.  No pneumothorax. Calcification of the aorta.  Stent graft in the upper mediastinum.  IMPRESSION: Cardiac enlargement and pulmonary vascular congestion with airspace disease in the lungs suggesting edema or pneumonia.   Original Report Authenticated By: Burman Nieves, M.D.   Medications:   . amLODipine  10 mg Oral Daily  . cinacalcet  30 mg Oral Q breakfast  . colchicine  0.6 mg Oral Daily  . heparin  5,000 Units Subcutaneous Q8H  . labetalol  200 mg Oral BID  . multivitamin  1 tablet Oral Daily  . pantoprazole  40 mg Oral Daily  . sevelamer carbonate  800 mg Oral TID WC  . sodium chloride  3 mL Intravenous Q12H     Physical Exam: General: Alert, pleasant BM, NAD  Heart: RRR, no mur or rub Lungs: CTA bilat Abdomen: BS pos, Soft , nntender Extremities: Dialysis Access: Bilat 1+ pedal edema./ Pos. bruit L UA AVF  DIALYSIS:  Dialyzes at [facility] on TTS. Primary Nephrologist Deterding. [name] 75.5kg.  HD Bath 2K, 2.5 Ca, Dialyzer 180 Nre, Heparin 7,000. Access LUE AVF.  Hectorol , Epogen 28000, Venofer 100mg   Problem/Plan  1. SOB/Pulmonary edema- HD yesterday 3547 uf with bp drop  End of hd/ 78 am wt still ~3 kg over edw but asympt up walking in rm /90% o2 sat recheck = 100%/ 2 D echo pending 2. ESRD TTS, [place]) hd in am Attempt 3 to 4 l [place] as tol 3. Hyperkalemia- resolved with HD 4. HTN- improved UF and  amlodipine 10mg  / Labetalol 200mg  bid 5. Anemia of chronic disease- hgb 10/0 epo on hd/ aranesp 150in hosp. 6. SHPTH- cont with meds ca/ phos stable 7. ST- depressions on EKG- CE neg X2 8. IDWG/HTN- may be losing weight and will need to have his [name] lowered   Lenny Pastel, PA-C [facility]per [phone] 08/02/2012,8:20 AM  LOS: 1 day   As above.  Exam shows le edema, trace presacral edema and mild +HJR.  Will plan HD today and in AM to facilitate volume reduction and lowering of [name]. Maaz Spiering C

## 2012-08-02 NOTE — Progress Notes (Signed)
PROGRESS NOTE  DIAMANTE RUBIN ZOX:[id number] DOB: 1945-02-05 DOA: 08/01/2012 PCP: Irena Cords, MD  Brief narrative: 67 y.o. AAM with PMH sig for bilateral nephrectomy (renal cell carcinoma), s/p failed kidney transplant, ESRD on HD (Tues, Thrs, Sat) who presented 08/01/12 c Acute hypoxicrespiratory failure in the setting of volume overload secondary to end-stage renal disease.   Past medical history-As per Problem list Chart reviewed as below- Admission 05/14/2012 for clotted left arm dialysis fistula-had right IJ HD cath insertion at that time Admission 03/20/2010 secondary to altered mental status secondary to under dialysis-was on peritoneal dialysis at that time. Noted end-stage renal disease status post failed allograft kidney [facility]  Consultants:  nephrology  Procedures:  Portable chest x-ray 08/02/2038  Antibiotics:  none   Subjective  Looks and feels well. Tolerating diet. No further shortness of breath. No nausea no vomiting. Had dialysis yesterday as well as is getting dialysis today I sent him on [place]. No fevers no chills no nausea no vomiting   Objective    Interim History: none  Telemetry: Sinus rhythm  Objective: Filed Vitals:   08/02/12 1600 08/02/12 1630 08/02/12 1653 08/02/12 1657  BP: 136/73 125/75 60/42 120/72  Pulse: 87 90 81 79  Temp:    98.5 F (36.9 C)  TempSrc:    Oral  Resp:    18  Height:      Weight:    70.2 kg (154 lb 12.2 oz)  SpO2:        Intake/Output Summary (Last 24 hours) at 08/02/12 1730 Last data filed at 08/02/12 1657  Gross per 24 hour  Intake    120 ml  Output   3800 ml  Net  -3680 ml    Exam:  General: alert pleasant oriented no apparent distress Cardiovascular: S1-S2 no murmur rub or gallop Respiratory: clinically clear Abdomen: soft nontender Skinno rash no other issues Neuro grossly intact  Data Reviewed: Basic Metabolic Panel:  Recent Labs Lab 08/01/12 0357 08/01/12 1715 08/02/12 0555  NA  133*  133* 138 138  K 6.4*  6.4* 4.5 4.4  CL 91*  91* 96 97  CO2 27  27 30 29   GLUCOSE 133*  133* 100* 101*  BUN 48*  49* 23 30*  CREATININE 9.59*  9.44* 5.75* 7.31*  CALCIUM 9.0  8.9 8.9 9.1  PHOS 3.9  --  3.3   Liver Function Tests:  Recent Labs Lab 08/01/12 0357 08/02/12 0555  ALBUMIN 3.1* 2.9*   No results found for this basename: LIPASE, AMYLASE,  in the last 168 hours  Recent Labs Lab 08/01/12 1715  AMMONIA 10*   CBC:  Recent Labs Lab 08/01/12 0357  WBC 9.7  NEUTROABS 7.3  HGB 10.2*  HCT 32.1*  MCV 83.8  PLT 348   Cardiac Enzymes:  Recent Labs Lab 08/01/12 1330 08/01/12 2035  TROPONINI <0.30 <0.30   BNP: No components found with this basename: POCBNP,  CBG: No results found for this basename: GLUCAP,  in the last 168 hours  Recent Results (from the past 240 hour(s))  MRSA PCR SCREENING     Status: None   Collection Time    08/01/12  5:13 PM      Result Value Range Status   MRSA by PCR NEGATIVE  NEGATIVE Final   Comment:            The GeneXpert MRSA Assay (FDA     approved for NASAL specimens     only), is one component of a  comprehensive MRSA colonization     surveillance program. It is not     intended to diagnose MRSA     infection nor to guide or     monitor treatment for     MRSA infections.     Studies:              All Imaging reviewed and is as per above notation   Scheduled Meds: . amLODipine  10 mg Oral Daily  . cinacalcet  30 mg Oral Q breakfast  . colchicine  0.6 mg Oral Daily  . [START ON 08/03/2012] darbepoetin (ARANESP) injection - DIALYSIS  150 mcg Intravenous Q Thu-HD  . [START ON 08/03/2012] doxercalciferol  4 mcg Intravenous Q T,Th,Sa-HD  . [START ON 08/03/2012] ferric gluconate (FERRLECIT/NULECIT) IV  62.5 mg Intravenous Q Thu-HD  . heparin  5,000 Units Subcutaneous Q8H  . labetalol  200 mg Oral BID  . multivitamin  1 tablet Oral Daily  . pantoprazole  40 mg Oral Daily  . sevelamer carbonate  800 mg  Oral TID WC  . sodium chloride  3 mL Intravenous Q12H   Continuous Infusions:    Assessment/Plan: 1. Toxic metabolic encephalopathy-probably secondary to under dialysis? Work up including calcitonin, ammonia negative.  Currently resolved 2. Acute respiratory failure secondary to hypervolemia-resolved 3. Hypervolemia/hyperkalemia-getting dialysis appreciate nephrology input 4. ESRD HD T/th/sat-per nephrology 5. Secondary hyperparathyroidism-continue Sensipar 30 mg daily q. A.m., Hectoral 4 mg withdialysis, Renvela 800 3 times a day 6. Hypertension-continue amlodipine 10 daily, labetalol 200 twice a day 7. Anemia of renal disease-continue darbepoetin 150 mcg q. Thursday, Neulecit to 62.5 mg every 30 8. History gout continue colchicine 0.6 daily  Code Status: full Family Communication: none at bedside Disposition Plan: likely discharge one to 2 days per nephrology   Pleas Koch, MD  [facility] Pager [phone] 08/02/2012, 5:30 PM    LOS: 1 day

## 2012-08-02 NOTE — Evaluation (Signed)
Physical Therapy One Time Evaluation Patient Details Name: Roger Gibson MRN: [id number] DOB: 1945-03-04 Today's Date: 08/02/2012 Time: 7829-5621 PT Time Calculation (min): 9 min  PT Assessment / Plan / Recommendation History of Present Illness     Clinical Impression  Patient evaluated by Physical Therapy with no further acute PT needs identified. All education has been completed and the patient has no further questions. No f/u PT or DME needs at this time. PT is signing off. Thank you for this referral.     PT Assessment  Patent does not need any further PT services    Follow Up Recommendations  No PT follow up    Does the patient have the potential to tolerate intense rehabilitation      Barriers to Discharge        Equipment Recommendations  None recommended by PT    Recommendations for Other Services     Frequency      Precautions / Restrictions Precautions Precautions: None   Pertinent Vitals/Pain See below, no pain     Mobility  Bed Mobility Bed Mobility: Not assessed Transfers Transfers: Sit to Stand;Stand to Sit Sit to Stand: 7: Independent;From chair/3-in-1 Stand to Sit: 7: Independent;To chair/3-in-1 Ambulation/Gait Ambulation/Gait Assistance: 7: Independent Ambulation Distance (Feet): 350 Feet Assistive device: None Ambulation/Gait Assistance Details: no SOB or unsteady gait, SaO2 93-94% room air, HR 97-103 during ambulation Gait Pattern: Within Functional Limits    Exercises     PT Diagnosis:    PT Problem List:   PT Treatment Interventions:       PT Goals(Current goals can be found in the care plan section)    Visit Information  Last PT Received On: 08/02/12 Assistance Needed: +1       Prior Functioning  Home Living Family/patient expects to be discharged to:: Private residence Living Arrangements: Spouse/significant other Type of Home: House Home Access: Stairs to enter Secretary/administrator of Steps: 3 Entrance Stairs-Rails:  Right Home Layout: One level Home Equipment: None Prior Function Level of Independence: Independent Communication Communication: No difficulties    Cognition  Cognition Arousal/Alertness: Awake/alert Behavior During Therapy: WFL for tasks assessed/performed Overall Cognitive Status: Within Functional Limits for tasks assessed    Extremity/Trunk Assessment Lower Extremity Assessment Lower Extremity Assessment: Overall WFL for tasks assessed Cervical / Trunk Assessment Cervical / Trunk Assessment: Normal   Balance    End of Session PT - End of Session Activity Tolerance: Patient tolerated treatment well Patient left: in chair;with call bell/phone within reach  GP Functional Assessment Tool Used: clinical judgement Functional Limitation: Mobility: Walking and moving around Mobility: Walking and Moving Around Current Status (H0865): 0 percent impaired, limited or restricted Mobility: Walking and Moving Around Goal Status (H8469): 0 percent impaired, limited or restricted Mobility: Walking and Moving Around Discharge Status [phone]): 0 percent impaired, limited or restricted   Aarilyn Dye,KATHrine E 08/02/2012, 9:03 AM Zenovia Jarred, PT, DPT 08/02/2012 Pager: [phone]

## 2012-08-03 LAB — CBC
MCH: 26.4 pg (ref 26.0–34.0)
MCHC: 31.4 g/dL (ref 30.0–36.0)
Platelets: 271 10*3/uL (ref 150–400)
Platelets: 274 10*3/uL (ref 150–400)
RDW: 20.7 % — ABNORMAL HIGH (ref 11.5–15.5)
WBC: 4.6 10*3/uL (ref 4.0–10.5)

## 2012-08-03 LAB — CORTISOL-AM, BLOOD: Cortisol - AM: 8.8 ug/dL (ref 4.3–22.4)

## 2012-08-03 MED ORDER — DARBEPOETIN ALFA-POLYSORBATE 150 MCG/0.3ML IJ SOLN
INTRAMUSCULAR | Status: AC
  Administered 2012-08-03: 150 ug
  Filled 2012-08-03: qty 0.3

## 2012-08-03 MED ORDER — HEPARIN SODIUM (PORCINE) 1000 UNIT/ML IJ SOLN: 1000.0000 [IU] | Freq: Once | INTRAMUSCULAR | Status: DC

## 2012-08-03 MED ORDER — DOXERCALCIFEROL 4 MCG/2ML IV SOLN
INTRAVENOUS | Status: AC
  Administered 2012-08-03: 4 ug
  Filled 2012-08-03: qty 2

## 2012-08-03 NOTE — Progress Notes (Signed)
CRITICAL VALUE ALERT  Critical value received: 6.3  Date of notification:  08/03/12  Time of notification:  0230 am  Critical value read back: yes  Nurse who received alert:  Barnett Applebaum, RN BC, BSN, MSN  MD notified (1st page):  Craige Cotta  Time of first page:  0240 am  MD notified (2nd page):  Time of second page:  Responding MD:    Time MD responded:

## 2012-08-03 NOTE — Procedures (Signed)
Subjective: Interval History: Currently on dialysis.  Had 3800 cc off yesterday. Current [name] was 75.5kg, today pre tx weight= 71.9.  Feels better.  Objective: Vital signs in last 24 hours: Temp:  [97.7 F (36.5 C)-100.1 F (37.8 C)] 98.1 F (36.7 C) (07/10 0945) Pulse Rate:  [74-101] 86 (07/10 1000) Resp:  [17-19] 17 (07/10 1000) BP: (60-149)/(42-78) 133/77 mmHg (07/10 1000) SpO2:  [95 %-100 %] 99 % (07/10 0945) Weight:  [70.2 kg (154 lb 12.2 oz)-75 kg (165 lb 5.5 oz)] 71.9 kg (158 lb 8.2 oz) (07/10 0945) Weight change: -3.047 kg (-6 lb 11.5 oz) Weight is 71.9kg today Intake/Output from previous day: 07/09 0701 - 07/10 0700 In: 360 [P.O.:360] Out: 3800  Intake/Output this shift:    General appearance: alert, cooperative and appears stated age Resp: clear to auscultation bilaterally Chest wall: no tenderness Cardio: regular rate and rhythm, S1, S2 normal, no murmur, click, rub or gallop Extremities: edema tr-1+ bilat Lab Results:  Recent Labs  08/01/12 0357  WBC 9.7  HGB 10.2*  HCT 32.1*  PLT 348   BMET:  Recent Labs  08/01/12 1715 08/02/12 0555  NA 138 138  K 4.5 4.4  CL 96 97  CO2 30 29  GLUCOSE 100* 101*  BUN 23 30*  CREATININE 5.75* 7.31*  CALCIUM 8.9 9.1   No results found for this basename: PTH,  in the last 72 hours Iron Studies: No results found for this basename: IRON, TIBC, TRANSFERRIN, FERRITIN,  in the last 72 hours Studies/Results: Dg Chest Port 1 View  08/01/2012   *RADIOLOGY REPORT*  Clinical Data: Post dialysis, cough  PORTABLE CHEST - 1 VIEW  Comparison: Portable exam 1218 hours compared to 0356 hours  Findings: Enlargement of cardiac silhouette with pulmonary vascular congestion. Bilateral pulmonary infiltrates greatest at right base question edema or infection, favor edema due to the degree of improved aeration in short period of time. Wall stent identified at left brachiocephalic vein. Question small amount of fluid within right fissure. AV  graft of poor left arm. No pneumothorax.  IMPRESSION: Enlargement of cardiac silhouette with pulmonary vascular congestion. Diffuse infiltrates bilaterally, slightly improved, favor edema over infection.   Original Report Authenticated By: Ulyses Southward, M.D.   Scheduled: . amLODipine  10 mg Oral Daily  . cinacalcet  30 mg Oral Q breakfast  . colchicine  0.6 mg Oral Daily  . darbepoetin (ARANESP) injection - DIALYSIS  150 mcg Intravenous Q Thu-HD  . doxercalciferol  4 mcg Intravenous Q T,Th,Sa-HD  . ferric gluconate (FERRLECIT/NULECIT) IV  62.5 mg Intravenous Q Thu-HD  . heparin  5,000 Units Subcutaneous Q8H  . labetalol  200 mg Oral BID  . multivitamin  1 tablet Oral Daily  . pantoprazole  40 mg Oral Daily  . sevelamer carbonate  800 mg Oral TID WC  . sodium chloride  3 mL Intravenous Q12H   -ESRD with volume overload  Plan: Lower [name], cardiac f/u as out patient   LOS: 2 days   Marely Apgar C 08/03/2012,10:18 AM

## 2012-08-03 NOTE — Progress Notes (Signed)
Subjective:  On hd ,no cos , noted dc home after hd , tolerated hd yesterday Objective Vital signs in last 24 hours: Filed Vitals:   08/03/12 0533 08/03/12 0945 08/03/12 0957 08/03/12 1000  BP: 121/73 128/77 139/71 133/77  Pulse: 85 89 88 86  Temp: 98.4 F (36.9 C) 98.1 F (36.7 C)    TempSrc: Oral Oral    Resp: 18 19 18 17   Height:      Weight:  71.9 kg (158 lb 8.2 oz)    SpO2: 97% 99%     Weight change: -3.047 kg (-6 lb 11.5 oz)  Intake/Output Summary (Last 24 hours) at 08/03/12 1020 Last data filed at 08/03/12 0539  Gross per 24 hour  Intake    360 ml  Output   3800 ml  Net  -3440 ml   Labs: Basic Metabolic Panel:  Recent Labs Lab 08/01/12 0357 08/01/12 1715 08/02/12 0555  NA 133*  133* 138 138  K 6.4*  6.4* 4.5 4.4  CL 91*  91* 96 97  CO2 27  27 30 29   GLUCOSE 133*  133* 100* 101*  BUN 48*  49* 23 30*  CREATININE 9.59*  9.44* 5.75* 7.31*  CALCIUM 9.0  8.9 8.9 9.1  PHOS 3.9  --  3.3   Liver Function Tests:  Recent Labs Lab 08/01/12 0357 08/02/12 0555  ALBUMIN 3.1* 2.9*   No results found for this basename: LIPASE, AMYLASE,  in the last 168 hours  Recent Labs Lab 08/01/12 1715  AMMONIA 10*   CBC:  Recent Labs Lab 08/01/12 0357  WBC 9.7  NEUTROABS 7.3  HGB 10.2*  HCT 32.1*  MCV 83.8  PLT 348   Cardiac Enzymes:  Recent Labs Lab 08/01/12 1330 08/01/12 2035  TROPONINI <0.30 <0.30   CBG: No results found for this basename: GLUCAP,  in the last 168 hours  Iron Studies: No results found for this basename: IRON, TIBC, TRANSFERRIN, FERRITIN,  in the last 72 hours Studies/Results: Dg Chest Port 1 View  08/01/2012   *RADIOLOGY REPORT*  Clinical Data: Post dialysis, cough  PORTABLE CHEST - 1 VIEW  Comparison: Portable exam 1218 hours compared to 0356 hours  Findings: Enlargement of cardiac silhouette with pulmonary vascular congestion. Bilateral pulmonary infiltrates greatest at right base question edema or infection, favor edema due to  the degree of improved aeration in short period of time. Wall stent identified at left brachiocephalic vein. Question small amount of fluid within right fissure. AV graft of poor left arm. No pneumothorax.  IMPRESSION: Enlargement of cardiac silhouette with pulmonary vascular congestion. Diffuse infiltrates bilaterally, slightly improved, favor edema over infection.   Original Report Authenticated By: Ulyses Southward, M.D.    Physical Exam:  General: Alert, pleasant BM, NAD . On hd Heart: RRR, no mur or rub  Lungs: CTA bilat  Abdomen: BS pos, Soft , nntender  Extremities: Dialysis Access: Bilat trace pedal edema./ Pos. bruit L UA AVF patent on hd.  DIALYSIS:  Dialyzes at [facility] on TTS. Primary Nephrologist Deterding. [name] 75.5kg.  HD Bath 2K, 2.5 Ca, Dialyzer 180 Nre, Heparin 7,000. Access LUE AVF.  Hectorol , Epogen 28000, Venofer 100mg    Problem/Plan  1. SOB/Pulmonary edema- HD today will be 3rd consecutive/ lower edw at dc/ yesterday post wt  70.2 kg and fu today's post tx ( old edw 75.5) 2 D echo 40 to 45 ef  2. ESRD TTS, [facility]) hd  Today on schedule/ notify [facility] lower edw 3. Hyperkalemia-  resolved with HD 4. HTN- improved UF and amlodipine 10mg  / Labetalol 200mg  bid 5. Anemia of chronic disease- hgb 10/0 epo on hd/ aranesp 150in hosp. 6. SHPTH- cont with meds ca/ phos stable 7. ST- depressions on EKG- CE neg X2   Lenny Pastel, PA-C [facility] Beeper [phone] 08/03/2012,10:20 AM  LOS: 2 days  Marylee Belzer C

## 2012-08-03 NOTE — Discharge Summary (Signed)
Physician Discharge Summary  Roger Gibson ZOX:[id number] DOB: [birth year] DOA: 08/01/2012  PCP: Roger Housekeeper, MD  Admit date: 08/01/2012 Discharge date: 08/03/2012  Time spent: 35 minutes  Recommendations for Outpatient Follow-up:  1. Recommend further follow up of Roger Gibson with primary nephrologist as OP 2. Consider Cardiology input for decreased EF as OP 3. Recommend discussion with PCP re: poor appetite  Discharge Diagnoses:  Principal Problem:   Pulmonary edema Active Problems:   HYPERLIPIDEMIA   HYPERTENSION   ESRD on hemodialysis   Hyperkalemia   Discharge Condition: good  Diet recommendation: renal  Filed Weights   08/02/12 1233 08/02/12 1657 08/02/12 2032  Weight: 75 kg (165 lb 5.5 oz) 70.2 kg (154 lb 12.2 oz) 70.2 kg (154 lb 12.2 oz)    History of present illness:  67 y.o. AAM with PMH sig for bilateral nephrectomy (renal cell carcinoma), s/p failed kidney transplant, ESRD on HD (Tues, Thrs, Sat) who presented 08/01/12 c Acute hypoxicrespiratory failure in the setting of volume overload secondary to end-stage renal disease. He was seen and admitted and Nephrology consulted and was dialyzed 3 days successively during stay in [facility] Course:   1. Toxic metabolic encephalopathy-probably secondary to under dialysis? Work up including calcitonin, ammonia negative. Currently resolved 2. Acute respiratory failure secondary to hypervolemia-ECHO cardiogram performed 08/01/12 showed mild LvH with hypocontractility-Also was suspsicious for inferior/Lateral Akinesis, but these issues are not new from Echo done at [facility] 2012 . 3. Acute decompensation of Grade 2 diastolic dysfunction-see above.  Note that Lisinopril has been held but may bneed to be re-implemented soon 4. Hypervolemia/hyperkalemia-getting dialysis appreciate nephrology input 5. ESRD HD T/th/sat-per nephrology-he was net neg -6.627 liters in hospital and weight dropped from 75 KG to 70 on d/c 6. Secondary  hyperparathyroidism-continue Sensipar 30 mg daily q. A.m., Hectoral 4 mg withdialysis, Renvela 800 3 times a day 7. Hypertension-continue amlodipine 10 daily, labetalol 200 twice a day-see #3 8. Anemia of renal disease-continue darbepoetin 150 mcg q. Thursday, Neulecit to 62.5 mg every 30 9. History gout continue colchicine 0.6 daily  Admission 05/14/2012 for clotted left arm dialysis fistula-had right IJ HD cath insertion at that time  Admission 03/20/2010 secondary to altered mental status secondary to under dialysis-was on peritoneal dialysis at that time. 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  denenis n/v/cp  General: EOMI Cardiovascular: s1 s2 no m/r/g Respiratory: clear  Discharge Instructions  Discharge Orders   Future Orders Complete By Expires     Diet - low sodium heart healthy  As directed     Discharge instructions  As directed     Comments:      See your regular MD Dr. Burney Gibson in about 1 week If you have further low appetite or other issues. Report it to Dr. Rene Gibson Follow up for regular dialysis in [facility]  on T/Th/Sat You weren't getting your Lisinopril in the Hospital as you probably didn't need it.  Keep the tablets and discuss this with your regular nephrologist next time you see him    Increase activity slowly  As directed         Medication List    STOP taking these medications       lisinopril 40 MG  tablet  Commonly known as:  PRINIVIL,ZESTRIL      TAKE these medications       amLODipine 10 MG tablet  Commonly known as:  NORVASC  Take 10 mg by mouth daily.     cinacalcet 30 MG tablet  Commonly known as:  SENSIPAR  Take 30 mg by mouth daily.     COLCRYS 0.6 MG tablet  Generic drug:  colchicine  Take 0.6 mg by mouth daily.     DIALYVITE 800 PO  Take 800 mg by mouth daily.     labetalol 200 MG tablet  Commonly known as:  NORMODYNE  Take 200 mg by mouth 2 (two) times daily.     omeprazole 20 MG capsule  Commonly known as:  PRILOSEC  Take 20 mg by mouth daily.     sevelamer 800 MG tablet  Commonly known as:  RENAGEL  Take 2,400 mg by mouth 3 (three) times daily with meals.       Allergies  Allergen Reactions  . Iodine Swelling  . Tape Rash    Adhesive tape        Follow-up Information   Follow up with Roger Housekeeper, MD.   Contact information:   [address] [place] [address] [phone]        The results of significant diagnostics from this hospitalization (including imaging, microbiology, ancillary and laboratory) are listed below for reference.    Significant Diagnostic Studies: Dg Chest Port 1 View  08/01/2012   *RADIOLOGY REPORT*  Clinical Data: Post dialysis, cough  PORTABLE CHEST - 1 VIEW  Comparison: Portable exam 1218 hours compared to 0356 hours  Findings: Enlargement of cardiac silhouette with pulmonary vascular congestion. Bilateral pulmonary infiltrates greatest at right base question edema or infection, favor edema due to the degree of improved aeration in short period of time. Wall stent identified at left brachiocephalic vein. Question small amount of fluid within right fissure. AV graft of poor left arm. No pneumothorax.  IMPRESSION: Enlargement of cardiac silhouette with pulmonary vascular congestion. Diffuse infiltrates bilaterally, slightly improved, favor edema over infection.   Original Report Authenticated By: Ulyses Southward, M.D.   Dg Chest Portable 1 View  08/01/2012   *RADIOLOGY REPORT*  Clinical Data: Respiratory distress.  Shortness of breath.  PORTABLE CHEST - 1 VIEW  Comparison: 06/27/2011.  Findings: Cardiac enlargement with mild pulmonary vascular congestion.  Bilateral perihilar and lower lobe airspace disease. This could be due to pneumonia, edema, or other infiltrative process.  No blunting of costophrenic angles.  No pneumothorax. Calcification of the aorta.  Stent graft in the upper mediastinum.  IMPRESSION: Cardiac enlargement and pulmonary vascular congestion with airspace disease in the lungs suggesting edema or pneumonia.   Original Report Authenticated By: Burman Nieves, M.D.    Microbiology: Recent Results (from the past 240 hour(s))  MRSA PCR SCREENING     Status: None   Collection Time    08/01/12  5:13 PM      Result Value Range Status   MRSA by PCR NEGATIVE  NEGATIVE Final   Comment:  The GeneXpert MRSA Assay (FDA     approved for NASAL specimens     only), is one component of a     comprehensive MRSA colonization     surveillance program. It is not     intended to diagnose MRSA     infection nor to guide or     monitor treatment for     MRSA infections.     Labs: Basic Metabolic Panel:  Recent Labs Lab 08/01/12 0357 08/01/12 1715 08/02/12 0555  NA 133*  133* 138 138  K 6.4*  6.4* 4.5 4.4  CL 91*  91* 96 97  CO2 27  27 30 29   GLUCOSE 133*  133* 100* 101*  BUN 48*  49* 23 30*  CREATININE 9.59*  9.44* 5.75* 7.31*  CALCIUM 9.0  8.9 8.9 9.1  PHOS 3.9  --  3.3   Liver Function Tests:  Recent Labs Lab 08/01/12 0357 08/02/12 0555  ALBUMIN 3.1* 2.9*   No results found for this basename: LIPASE, AMYLASE,  in the last 168 hours  Recent Labs Lab 08/01/12 1715  AMMONIA 10*   CBC:  Recent Labs Lab 08/01/12 0357  WBC 9.7  NEUTROABS 7.3  HGB 10.2*  HCT 32.1*  MCV 83.8  PLT 348   Cardiac Enzymes:  Recent Labs Lab 08/01/12 1330  08/01/12 2035  TROPONINI <0.30 <0.30   BNP: BNP (last 3 results)  Recent Labs  08/01/12 0357  PROBNP >70000.0*   CBG: No results found for this basename: GLUCAP,  in the last 168 hours     Signed:  Rhetta Mura  [facility] 08/03/2012, 8:53 AM

## 2012-09-21 ENCOUNTER — Ambulatory Visit: Payer: Self-pay | Admitting: Internal Medicine

## 2012-09-22 LAB — BASIC METABOLIC PANEL
Anion Gap: 10 (ref 7–16)
BUN: 39 mg/dL — ABNORMAL HIGH (ref 7–18)
Calcium, Total: 10.1 mg/dL (ref 8.5–10.1)
EGFR (African American): 9 — ABNORMAL LOW
EGFR (Non-African Amer.): 8 — ABNORMAL LOW
Glucose: 96 mg/dL (ref 65–99)
Osmolality: 276 (ref 275–301)
Potassium: 4.8 mmol/L (ref 3.5–5.1)
Sodium: 133 mmol/L — ABNORMAL LOW (ref 136–145)

## 2012-09-22 LAB — MAGNESIUM: Magnesium: 2 mg/dL

## 2012-09-22 LAB — CBC WITH DIFFERENTIAL/PLATELET
Eosinophil #: 0 10*3/uL (ref 0.0–0.7)
Eosinophil %: 0 %
HCT: 33.8 % — ABNORMAL LOW (ref 40.0–52.0)
Lymphocyte %: 12.7 %
MCHC: 32.3 g/dL (ref 32.0–36.0)
MCV: 89 fL (ref 80–100)
Neutrophil #: 2.8 10*3/uL (ref 1.4–6.5)
Platelet: 317 10*3/uL (ref 150–440)
RDW: 20.7 % — ABNORMAL HIGH (ref 11.5–14.5)
WBC: 3.5 10*3/uL — ABNORMAL LOW (ref 3.8–10.6)

## 2012-10-09 ENCOUNTER — Ambulatory Visit: Payer: Self-pay | Admitting: Vascular Surgery

## 2012-10-09 LAB — BASIC METABOLIC PANEL
Anion Gap: 7 (ref 7–16)
BUN: 33 mg/dL — ABNORMAL HIGH (ref 7–18)
Chloride: 94 mmol/L — ABNORMAL LOW (ref 98–107)
Creatinine: 9.78 mg/dL — ABNORMAL HIGH (ref 0.60–1.30)
EGFR (African American): 6 — ABNORMAL LOW
EGFR (Non-African Amer.): 5 — ABNORMAL LOW
Potassium: 5 mmol/L (ref 3.5–5.1)

## 2012-10-09 LAB — CBC
MCV: 91 fL (ref 80–100)
RDW: 17.9 % — ABNORMAL HIGH (ref 11.5–14.5)

## 2012-10-11 ENCOUNTER — Ambulatory Visit: Payer: Self-pay | Admitting: Vascular Surgery

## 2012-11-30 ENCOUNTER — Other Ambulatory Visit: Payer: Self-pay

## 2013-01-08 ENCOUNTER — Ambulatory Visit: Payer: Medicare HMO | Admitting: Cardiology

## 2013-01-17 ENCOUNTER — Ambulatory Visit: Payer: Medicare HMO | Admitting: Cardiology

## 2013-11-09 ENCOUNTER — Other Ambulatory Visit: Payer: Self-pay

## 2014-05-14 ENCOUNTER — Encounter (HOSPITAL_COMMUNITY): Payer: Self-pay | Admitting: *Deleted

## 2014-05-14 ENCOUNTER — Emergency Department (HOSPITAL_COMMUNITY)
Admission: EM | Admit: 2014-05-14 | Discharge: 2014-05-15 | Disposition: A | Discharge status: Other Acute Inpatient Hospital | Payer: Medicare HMO | Attending: Emergency Medicine | Admitting: Emergency Medicine

## 2014-05-14 ENCOUNTER — Emergency Department (HOSPITAL_COMMUNITY): Payer: Medicare HMO

## 2014-05-14 DIAGNOSIS — Z8639 Personal history of other endocrine, nutritional and metabolic disease: Secondary | ICD-10-CM | POA: Insufficient documentation

## 2014-05-14 DIAGNOSIS — R5382 Chronic fatigue, unspecified: Secondary | ICD-10-CM

## 2014-05-14 DIAGNOSIS — D849 Immunodeficiency, unspecified: Secondary | ICD-10-CM

## 2014-05-14 DIAGNOSIS — I120 Hypertensive chronic kidney disease with stage 5 chronic kidney disease or end stage renal disease: Secondary | ICD-10-CM | POA: Insufficient documentation

## 2014-05-14 DIAGNOSIS — Z9225 Personal history of immunosupression therapy: Secondary | ICD-10-CM | POA: Insufficient documentation

## 2014-05-14 DIAGNOSIS — Z85528 Personal history of other malignant neoplasm of kidney: Secondary | ICD-10-CM | POA: Insufficient documentation

## 2014-05-14 DIAGNOSIS — N179 Acute kidney failure, unspecified: Secondary | ICD-10-CM | POA: Diagnosis not present

## 2014-05-14 DIAGNOSIS — I509 Heart failure, unspecified: Secondary | ICD-10-CM

## 2014-05-14 DIAGNOSIS — D899 Disorder involving the immune mechanism, unspecified: Secondary | ICD-10-CM

## 2014-05-14 DIAGNOSIS — I10 Essential (primary) hypertension: Secondary | ICD-10-CM | POA: Diagnosis present

## 2014-05-14 DIAGNOSIS — J810 Acute pulmonary edema: Secondary | ICD-10-CM

## 2014-05-14 DIAGNOSIS — N186 End stage renal disease: Secondary | ICD-10-CM | POA: Diagnosis not present

## 2014-05-14 DIAGNOSIS — R0602 Shortness of breath: Secondary | ICD-10-CM | POA: Diagnosis present

## 2014-05-14 DIAGNOSIS — Z940 Kidney transplant status: Secondary | ICD-10-CM

## 2014-05-14 DIAGNOSIS — Z862 Personal history of diseases of the blood and blood-forming organs and certain disorders involving the immune mechanism: Secondary | ICD-10-CM | POA: Insufficient documentation

## 2014-05-14 DIAGNOSIS — J811 Chronic pulmonary edema: Secondary | ICD-10-CM | POA: Diagnosis present

## 2014-05-14 LAB — CBC
HCT: 29.6 % — ABNORMAL LOW (ref 39.0–52.0)
Hemoglobin: 9.5 g/dL — ABNORMAL LOW (ref 13.0–17.0)
MCH: 29.9 pg (ref 26.0–34.0)
MCHC: 32.1 g/dL (ref 30.0–36.0)
MCV: 93.1 fL (ref 78.0–100.0)
PLATELETS: 184 10*3/uL (ref 150–400)
RBC: 3.18 MIL/uL — AB (ref 4.22–5.81)
RDW: 17.8 % — ABNORMAL HIGH (ref 11.5–15.5)
WBC: 4 10*3/uL (ref 4.0–10.5)

## 2014-05-14 LAB — URINALYSIS, ROUTINE W REFLEX MICROSCOPIC
BILIRUBIN URINE: NEGATIVE
Glucose, UA: NEGATIVE mg/dL
HGB URINE DIPSTICK: NEGATIVE
Ketones, ur: NEGATIVE mg/dL
Leukocytes, UA: NEGATIVE
NITRITE: NEGATIVE
PROTEIN: NEGATIVE mg/dL
Specific Gravity, Urine: 1.01 (ref 1.005–1.030)
Urobilinogen, UA: 0.2 mg/dL (ref 0.0–1.0)
pH: 5.5 (ref 5.0–8.0)

## 2014-05-14 LAB — BASIC METABOLIC PANEL
Anion gap: 10 (ref 5–15)
BUN: 30 mg/dL — ABNORMAL HIGH (ref 6–23)
CALCIUM: 9.5 mg/dL (ref 8.4–10.5)
CO2: 23 mmol/L (ref 19–32)
CREATININE: 1.74 mg/dL — AB (ref 0.50–1.35)
Chloride: 107 mmol/L (ref 96–112)
GFR calc Af Amer: 44 mL/min — ABNORMAL LOW (ref >90)
GFR, EST NON AFRICAN AMERICAN: 38 mL/min — AB (ref >90)
GLUCOSE: 137 mg/dL — AB (ref 70–99)
Potassium: 4.5 mmol/L (ref 3.5–5.1)
Sodium: 140 mmol/L (ref 135–145)

## 2014-05-14 LAB — CK TOTAL AND CKMB (NOT AT ARMC)
CK, MB: 4.2 ng/mL — ABNORMAL HIGH (ref 0.3–4.0)
Relative Index: INVALID (ref 0.0–2.5)
Total CK: 78 U/L (ref 7–232)

## 2014-05-14 LAB — I-STAT CG4 LACTIC ACID, ED: Lactic Acid, Venous: 1.22 mmol/L (ref 0.5–2.0)

## 2014-05-14 LAB — I-STAT TROPONIN, ED
Troponin i, poc: 0.06 ng/mL (ref 0.00–0.08)
Troponin i, poc: 0.04 ng/mL (ref 0.00–0.08)

## 2014-05-14 LAB — BRAIN NATRIURETIC PEPTIDE: B Natriuretic Peptide: >4500 pg/mL — ABNORMAL HIGH (ref 0.0–100.0)

## 2014-05-14 MED ORDER — HYDRALAZINE HCL 20 MG/ML IJ SOLN
10.0000 mg | Freq: Once | INTRAMUSCULAR | Status: AC
  Administered 2014-05-14: 10 mg via INTRAVENOUS
  Filled 2014-05-14: qty 1

## 2014-05-14 MED ORDER — TACROLIMUS 1 MG PO CAPS
7.0000 mg | ORAL_CAPSULE | Freq: Two times a day (BID) | ORAL | Status: DC
  Administered 2014-05-14: 7 mg via ORAL
  Filled 2014-05-14 (×3): qty 7

## 2014-05-14 MED ORDER — MYCOPHENOLATE SODIUM 180 MG PO TBEC
540.0000 mg | DELAYED_RELEASE_TABLET | Freq: Two times a day (BID) | ORAL | Status: DC
  Administered 2014-05-14: 540 mg via ORAL
  Filled 2014-05-14 (×3): qty 3

## 2014-05-14 MED ORDER — VALGANCICLOVIR HCL 450 MG PO TABS
900.0000 mg | ORAL_TABLET | Freq: Every day | ORAL | Status: DC
  Administered 2014-05-14: 900 mg via ORAL
  Filled 2014-05-14 (×2): qty 2

## 2014-05-14 MED ORDER — TAMSULOSIN HCL 0.4 MG PO CAPS
0.4000 mg | ORAL_CAPSULE | Freq: Every day | ORAL | Status: DC
  Filled 2014-05-14: qty 1

## 2014-05-14 MED ORDER — LABETALOL HCL 100 MG PO TABS
100.0000 mg | ORAL_TABLET | Freq: Two times a day (BID) | ORAL | Status: DC
  Administered 2014-05-14: 100 mg via ORAL
  Filled 2014-05-14 (×3): qty 1

## 2014-05-14 MED ORDER — FUROSEMIDE 10 MG/ML IJ SOLN
40.0000 mg | Freq: Once | INTRAMUSCULAR | Status: AC
  Administered 2014-05-14: 40 mg via INTRAVENOUS
  Filled 2014-05-14: qty 4

## 2014-05-14 NOTE — ED Notes (Signed)
Pt states that he has had worsening SOB since Saturday. Pt states that he is SOB at rest. Pt had kidney transplant on march 1st. Pt was told by the transplant center to come in for evaluation today. Pt states that he take high bp medications.

## 2014-05-14 NOTE — Consult Note (Signed)
PCP:   Elsie Stain, MD   Chief Complaint:  sob  Reason for consult:  Pulmonary edema, sob, kidney transplant 2 months ago at [facility] with worsening renal failure Referring MD:  ED  HPI: 69 yo male with h/o renal transplant 2 months ago at [facility] cr 1.4 last week, HTN, HLD comes in with 3 days of worsening sob and peripheral edema.  Denies any fevers.  No n/v/d.  No cp or abd pain.  No dysuria, hematuria.  Adequate uop.  Found to have worse CR of 1.7 and pulm edema on cxr.  Referred to our service for admission of his renal failure, and chf as pt wishes to stay here at [facility] instead of going to [place] due to his wife living here.  Review of Systems:  Positive and negative as per HPI otherwise all other systems are negative  Past Medical History: Past Medical History  Diagnosis Date  . Iron deficiency anemia     PRE 2004/06/2000  . Gout 01/2002    Right knee; Left great toe  . HLD (hyperlipidemia) 1999  . HTN (hypertension) 1974  . Confusion     during admission to [facility] 2012, persisted after discharge  . Renal cysts, acquired, bilateral     per [employer]  . ESRD (end stage renal disease) on dialysis     T, Th, Sat HD in [place], [place]  . ESRD (end stage renal disease)   . Allergy   . Blood transfusion without reported diagnosis   . Cancer     right kidney ca   Past Surgical History  Procedure Laterality Date  . Nephrectomy  1965    [place]infection[place]  . Achilles tendon repair  [address] [place]  . Mr Jodene Nam abdomen  2000    3x3 mass left kidney  . Nephrectomy  2000    partial-(2nd to renal carcinoma) Dr. [place] Crane [facility]  . Colonoscopy  05/15/03    Polyp (biopsy negative) repeat in 5 years  . Ct abd w & pelvis wo cm  03/03/06    Right kidney absent; Left with cystic changes  . Cystoscopy  03/03/06    Mild to mod. BPH observed/ otherwise normal  . Kidney transplant  07/06/2006    deceased donor-RLQ Abd, partial neph  for renal cell CA/complete Nephrectomy for infection ([facility])  . Colonoscopy  07/18/08    1 rectal polyp (Dr. Wynetta Emery) 5 years    Medications: Prior to Admission medications   Medication Sig Start Date End Date Taking? Authorizing Provider  furosemide (LASIX) 20 MG tablet Take 40 mg by mouth 2 (two) times daily.   Yes Historical Provider, MD  labetalol (NORMODYNE) 100 MG tablet Take 100 mg by mouth 2 (two) times daily.   Yes Historical Provider, MD  Multiple Vitamin (MULTIVITAMIN WITH MINERALS) TABS tablet Take 1 tablet by mouth daily.   Yes Historical Provider, MD  mycophenolate (MYFORTIC) 180 MG EC tablet Take 540 mg by mouth 2 (two) times daily.   Yes Historical Provider, MD  omeprazole (PRILOSEC) 20 MG capsule Take 20 mg by mouth daily.   Yes Historical Provider, MD  predniSONE (DELTASONE) 5 MG tablet Take 25 mg by mouth daily with breakfast.   Yes Historical Provider, MD  sertraline (ZOLOFT) 50 MG tablet Take 50 mg by mouth daily.   Yes Historical Provider, MD  sulfamethoxazole-trimethoprim (BACTRIM DS,SEPTRA DS) 800-160 MG per tablet Take 1 tablet by mouth  every Monday, Wednesday, and Friday.   Yes Historical Provider, MD  tacrolimus (PROGRAF) 1 MG capsule Take 7 mg by mouth 2 (two) times daily.   Yes Historical Provider, MD  tamsulosin (FLOMAX) 0.4 MG CAPS capsule Take 0.4 mg by mouth daily after supper.   Yes Historical Provider, MD  valGANciclovir (VALCYTE) 450 MG tablet Take 900 mg by mouth daily with supper.   Yes Historical Provider, MD    Allergies:   Allergies  Allergen Reactions  . Iodinated Diagnostic Agents Hives and Swelling  . Iodine Hives and Swelling  . Tape Rash    Adhesive tape     Social History:  reports that he quit smoking about 16 years ago. His smoking use included Cigarettes and Cigars. He does not have any smokeless tobacco history on file. He reports that he drinks alcohol. He reports that he does not use illicit drugs.  Family History: Family History   Problem Relation Age of Onset  . Stomach cancer Father     ?  Marland Kitchen Hypertension Mother     On dialysis for years  . Hypertension Brother   . Hypertension Brother   . Hypertension Brother   . Hypertension Sister   . Breast cancer Sister     s/p mastectomy  . COPD Sister   . Hypertension Sister   . Kidney failure Sister     on dialysis  . Heart attack Paternal Grandfather   . Colon cancer Neg Hx   . Esophageal cancer Neg Hx   . Rectal cancer Neg Hx     Physical Exam: Filed Vitals:   05/14/14 1715 05/14/14 1800 05/14/14 1830 05/14/14 1945  BP: 193/93 194/75 187/90 170/98  Pulse: 90 84 86 82  Temp:      TempSrc:      Resp: 30 25 37 38  Height:      Weight:      SpO2: 96% 96% 96% 94%   General appearance: alert, cooperative and no distress Head: Normocephalic, without obvious abnormality, atraumatic Eyes: negative Nose: Nares normal. Septum midline. Mucosa normal. No drainage or sinus tenderness. Neck: no JVD and supple, symmetrical, trachea midline Lungs: clear to auscultation bilaterally Heart: regular rate and rhythm, S1, S2 normal, no murmur, click, rub or gallop Abdomen: soft, non-tender; bowel sounds normal; no masses,  no organomegaly Extremities: extremities normal, atraumatic, no cyanosis or edema trace ble edema Pulses: 2+ and symmetric Skin: Skin color, texture, turgor normal. No rashes or lesions Neurologic: Grossly normal    Labs on Admission:   Recent Labs  05/14/14 1626  NA 140  K 4.5  CL 107  CO2 23  GLUCOSE 137*  BUN 30*  CREATININE 1.74*  CALCIUM 9.5    Recent Labs  05/14/14 1626  WBC 4.0  HGB 9.5*  HCT 29.6*  MCV 93.1  PLT 184    Radiological Exams on Admission: Dg Chest 2 View  05/14/2014   CLINICAL DATA:  Shortness of breath, congestion  EXAM: CHEST  2 VIEW  COMPARISON:  08/01/2012  FINDINGS: Borderline cardiomegaly. Mild interstitial prominence bilaterally. Mild interstitial edema cannot be excluded. Small bilateral pleural  effusion with bilateral basilar atelectasis or infiltrate.  IMPRESSION: Mild interstitial prominence bilateral suspicious for mild interstitial edema. Small bilateral pleural effusion with bilateral basilar atelectasis or infiltrate.   Electronically Signed   By: Lahoma Crocker M.D.   On: 05/14/2014 16:19    Assessment/Plan  69 yo male s/p renal transplant at [place] 2 months ago now with  pulmonary edema and worsening renal function  Principal Problem:   Renal failure, acute- this is actually his second transplant.  Discussed with patient and wife, he would be better served at [facility] for optimal management of his active renal issues, particularly with recent repeat transplant.  They are agreeable to going to [facility].  Advised ED provider pt needs to be transferred to [facility].  Agree with iv lasix ordered.     Active Problems:  Stable unless o/w noted   Essential hypertension   Pulmonary edema   CHF exacerbation   Renal transplant recipient  Consult time 40 min  To review records, speak to ED provider, speak and access patient and family.    Berkeley Vanaken A 05/14/2014, 8:44 PM

## 2014-05-14 NOTE — ED Provider Notes (Signed)
CSN: [id number]     Arrival date & time 05/14/14  1544 History   First MD Initiated Contact with Patient 05/14/14 1648     Chief Complaint  Patient presents with  . Shortness of Breath    (Consider location/radiation/quality/duration/timing/severity/associated sxs/prior Treatment) Patient is a 69 y.o. male presenting with shortness of breath. The history is provided by the patient and the spouse. No language interpreter was used.  Shortness of Breath Severity:  Moderate Onset quality:  Gradual Timing:  Constant Progression:  Worsening Chronicity:  New Context: activity   Relieved by:  Rest Worsened by:  Nothing tried Ineffective treatments:  None tried Associated symptoms: abdominal pain   Associated symptoms: no chest pain, no cough, no fever, no headaches, no rash, no sore throat, no sputum production, no vomiting and no wheezing   Risk factors: hx of cancer and recent surgery   Risk factors: no prolonged immobilization     Past Medical History  Diagnosis Date  . Iron deficiency anemia     PRE 2004/06/2000  . Gout 01/2002    Right knee; Left great toe  . HLD (hyperlipidemia) 1999  . HTN (hypertension) 1974  . Confusion     during admission to [facility] 2012, persisted after discharge  . Renal cysts, acquired, bilateral     per [employer]  . ESRD (end stage renal disease) on dialysis     T, Th, Sat HD in [place], [place]  . ESRD (end stage renal disease)   . Allergy   . Blood transfusion without reported diagnosis   . Cancer     right kidney ca   Past Surgical History  Procedure Laterality Date  . Nephrectomy  1965    [place]  . Achilles tendon repair  [address] [place]  . Mr Jodene Nam abdomen  2000    3x3 mass left kidney  . Nephrectomy  2000    partial-(2nd to renal carcinoma) Dr. [place] Crane [facility]  . Colonoscopy  05/15/03    Polyp (biopsy negative) repeat in 5 years  . Ct abd w & pelvis wo cm  03/03/06     Right kidney absent; Left with cystic changes  . Cystoscopy  03/03/06    Mild to mod. BPH observed/ otherwise normal  . Kidney transplant  2006/07/15    deceased donor-RLQ Abd, partial neph for renal cell CA/complete Nephrectomy for infection ([facility])  . Colonoscopy  07/18/08    1 rectal polyp (Dr. Wynetta Emery) 5 years   Family History  Problem Relation Age of Onset  . Stomach cancer Father     ?  Marland Kitchen Hypertension Mother     On dialysis for years  . Hypertension Brother   . Hypertension Brother   . Hypertension Brother   . Hypertension Sister   . Breast cancer Sister     s/p mastectomy  . COPD Sister   . Hypertension Sister   . Kidney failure Sister     on dialysis  . Heart attack Paternal Grandfather   . Colon cancer Neg Hx   . Esophageal cancer Neg Hx   . Rectal cancer Neg Hx    History  Substance Use Topics  . Smoking status: Former Smoker    Types: Cigarettes, Cigars    Quit date: 01/25/1998  . Smokeless tobacco: Not on file     Comment: occasional cigar  . Alcohol Use: Yes     Comment: very rare  Review of Systems  Constitutional: Positive for fatigue. Negative for fever and chills.  HENT: Negative for sore throat.   Respiratory: Positive for shortness of breath. Negative for cough, sputum production, chest tightness and wheezing.   Cardiovascular: Negative for chest pain.  Gastrointestinal: Positive for abdominal pain. Negative for nausea, vomiting and diarrhea.  Genitourinary: Positive for decreased urine volume. Negative for frequency, flank pain and difficulty urinating.  Skin: Negative for rash.  Neurological: Negative for light-headedness and headaches.  Psychiatric/Behavioral: Negative for confusion.  All other systems reviewed and are negative.     Allergies  Iodine and Tape  Home Medications   Prior to Admission medications   Medication Sig Start Date End Date Taking? Authorizing Provider  amLODipine (NORVASC) 10 MG tablet Take 10 mg by mouth daily.     Historical Provider, MD  B Complex-C-Folic Acid (DIALYVITE 762 PO) Take 800 mg by mouth daily.    Historical Provider, MD  cinacalcet (SENSIPAR) 30 MG tablet Take 30 mg by mouth daily.    Historical Provider, MD  colchicine (COLCRYS) 0.6 MG tablet Take 0.6 mg by mouth daily.    Historical Provider, MD  labetalol (NORMODYNE) 200 MG tablet Take 200 mg by mouth 2 (two) times daily.    Historical Provider, MD  omeprazole (PRILOSEC) 20 MG capsule Take 20 mg by mouth daily.    Historical Provider, MD  sevelamer (RENAGEL) 800 MG tablet Take 2,400 mg by mouth 3 (three) times daily with meals.     Historical Provider, MD   BP 187/90 mmHg  Pulse 86  Temp(Src) 98.3 F (36.8 C) (Oral)  Resp 37  Ht '5\' 6"'$  (1.676 m)  Wt 166 lb 4.8 oz (75.433 kg)  BMI 26.85 kg/m2  SpO2 96%   Physical Exam  Constitutional: He is oriented to person, place, and time. He appears well-developed and well-nourished. He does not have a sickly appearance. No distress.  HENT:  Head: Normocephalic and atraumatic.  Nose: Nose normal.  Mouth/Throat: Oropharynx is clear and moist. No oropharyngeal exudate.  Eyes: EOM are normal. Pupils are equal, round, and reactive to light.  Neck: Normal range of motion. Neck supple.  Cardiovascular: Normal rate, regular rhythm, normal heart sounds and intact distal pulses.   No murmur heard. Pulmonary/Chest: Tachypnea noted. No respiratory distress. He has decreased breath sounds. He has no wheezes. He exhibits no tenderness.  Abdominal: Soft. He exhibits no distension. There is no tenderness. There is no guarding.  Mildly tender at LLQ No rigidity or rebound.   Musculoskeletal: Normal range of motion. He exhibits no tenderness.  Neurological: He is alert and oriented to person, place, and time. No cranial nerve deficit. Coordination normal.  Skin: Skin is warm and dry. He is not diaphoretic. No pallor.  Psychiatric: He has a normal mood and affect. His behavior is normal. Judgment and  thought content normal.  Nursing note and vitals reviewed.   ED Course  Procedures (including critical care time) Labs Review Labs Reviewed  CBC - Abnormal; Notable for the following:    RBC 3.18 (*)    Hemoglobin 9.5 (*)    HCT 29.6 (*)    RDW 17.8 (*)    All other components within normal limits  BASIC METABOLIC PANEL - Abnormal; Notable for the following:    Glucose, Bld 137 (*)    BUN 30 (*)    Creatinine, Ser 1.74 (*)    GFR calc non Af Amer 38 (*)    GFR calc Af Wyvonnia Lora  44 (*)    All other components within normal limits  BRAIN NATRIURETIC PEPTIDE - Abnormal; Notable for the following:    B Natriuretic Peptide >4500.0 (*)    All other components within normal limits  CK TOTAL AND CKMB - Abnormal; Notable for the following:    CK, MB 4.2 (*)    All other components within normal limits  CULTURE, BLOOD (ROUTINE X 2)  CULTURE, BLOOD (ROUTINE X 2)  URINE CULTURE  URINALYSIS, ROUTINE W REFLEX MICROSCOPIC  I-STAT TROPOININ, ED  I-STAT CG4 LACTIC ACID, ED  Randolm Idol, ED    Imaging Review Dg Chest 2 View  05/14/2014   CLINICAL DATA:  Shortness of breath, congestion  EXAM: CHEST  2 VIEW  COMPARISON:  08/01/2012  FINDINGS: Borderline cardiomegaly. Mild interstitial prominence bilaterally. Mild interstitial edema cannot be excluded. Small bilateral pleural effusion with bilateral basilar atelectasis or infiltrate.  IMPRESSION: Mild interstitial prominence bilateral suspicious for mild interstitial edema. Small bilateral pleural effusion with bilateral basilar atelectasis or infiltrate.   Electronically Signed   By: Lahoma Crocker M.D.   On: 05/14/2014 16:19     EKG Interpretation   Date/Time:  Tuesday May 14 2014 15:51:34 EDT Ventricular Rate:  86 PR Interval:  114 QRS Duration: 82 QT Interval:  382 QTC Calculation: 457 R Axis:   88 Text Interpretation:  Normal sinus rhythm Anterior infarct , age  undetermined Abnormal ECG When compared with ECG of 08/01/2012, HEART  RATE  has decreased Confirmed by [facility]  MD, DAVID ([id number]) on 05/14/2014 5:08:32  PM      MDM   Final diagnoses:  History of renal transplant  Chronic fatigue  Immunosuppressed status  CHF exacerbation   Pt is a 69 yo M with hx of HTN, HLD, renal cell carcinoma s/p resection, history of remote kidney transplant that failed so he was bridged with HD until he received new transplant on March 1st.  He presents today with 4 days of progressive fatigue, SOB, and mild decrease in urinary output. No fever, diarrhea, vomiting, or abd/back pain.   Is ~2 months out of a cadaveric transplant on March 1st at [facility] with Dr. Tobe Sos.  Reports compliance to his immunosuppression drugs since.  Was reportedly seen 4 days ago at a follow up appointment and labs were ok and they reported he was making good progress.  Per wife, Cr was 1.4 on Friday.     He complains of SOB and is visibly mildly dyspneic.  He has decreased air movement bilaterally but no rales or rhonchi.  O2 stable in mid 90s on room air.  Due to symptomatic dyspnea, he was placed on 2L O2 by [place] (but never was hypoxic).   He was worked up with labs, CXR, and EKG.   CXR shows bilateral pleural effusions, fluid in fissure, and mild pulmonary edema.  Labs returned with BNP > 4500.  His electrolytes are benign.  AKI with Cr 1.7 (slight bump from 4 days ago).   Trop negative (0.06).   Pt found to be in acute CHF exacerbation.  Takes lasix 40 mg PO daily, so will give 40 mg IV.    He has been hypertensive throughout his ED stay, with pressures in 433-295 systolic range.  Given hydralazine 10 mg IV x 2.     Offered admission here or [facility] and family requested here as they live in [place].   Called his transplant coordinator, Early Osmond, at [phone] at 1845 and left a message that  patient was in the ED at [facility].    Spoke to hospitalist (Dr. Shanon Brow) and they requested confirmation with [facility] nephrology team prior to accepting him at [facility].    Spoke to Dr. Mercy Moore with nephro and he suggested that the patient may be better suited at [facility] due to his recent transplant.  Called [facility] and spoke to the nephro fellow on call (Dr. Jasmine December) about the patient at 2000.  She states that his Cr was 1.5 4 days ago.  She will discuss with Dr. Lissa Merlin and call back.   2020: Fellow spoke to her attending, Dr. Esaw Dace, who said he would be willing to follow him at [facility], but would be best to transfer to the general med service.  Goal 1-2 L negative in the next 24 hrs.  Continue home meds  Given home meds and dinner tray at 2130.    Called [facility] transfer line at [phone] to request transfer to [facility].  2120: Spoke to Dr. Angela Adam at [facility] and they will accept the patient.  Repeat trop and CKMB ordered.   Working on transportation.     Patient was seen with ED Attending, Dr. Maryjean Ka, MD     Tori Milks, MD 59/29/24 [id number]  Delora Fuel, MD 63/81/77 [id number]

## 2014-05-14 NOTE — Op Note (Signed)
PATIENT NAME:  Roger Gibson, Roger Gibson MR#:  [id number] DATE OF BIRTH:  [birth year]  DATE OF PROCEDURE:  08/16/2011  PREOPERATIVE DIAGNOSES:  1. Endstage renal disease.  2. Poorly functioning left arm AV fistula.  3. Hypertension.   POSTOPERATIVE DIAGNOSES:  1. Endstage renal disease.  2. Poorly functioning left arm AV fistula.  3. Hypertension.   PROCEDURES:  1. Ultrasound guidance for vascular access, left brachiocephalic AV fistula.  2. Left upper extremity fistulogram and central venogram.  3. Percutaneous transluminal angioplasty of left innominate vein with 10-mm diameter angioplasty balloon.  4. Percutaneous transluminal angioplasty of left cephalic vein near cephalic vein/subclavian vein confluence with 7-, 8-,  and 10-mm diameter angioplasty balloons.   SURGEON: Algernon Huxley, M.D.   ANESTHESIA: Local with moderate conscious sedation.   ESTIMATED BLOOD LOSS: Approximately 25 mL.   INDICATION FOR PROCEDURE: 69 year old African American male with endstage renal disease. His arm is painful and swollen.  He has prominent superficial varicosities and his fistula is more painful with dialysis. Fistulogram is performed for further evaluation. Risks and benefits were discussed. Informed consent was obtained.   DESCRIPTION OF PROCEDURE: The patient was brought to the vascular interventional radiology suite. The left upper extremity was sterilely prepped and draped and a sterile surgical field was created. Ultrasound was used to access the fistula in the proximal portion of the fistula not far beyond the anastomosis with a micropuncture needle; micropuncture wire and sheath were placed. Imaging demonstrated a moderate- to high-grade stenosis in the cephalic vein just before the subclavian vein confluence, and another moderate stenosis within the innominate vein near the midline. The patient was given systemic heparin. A 6 French sheath was placed. These lesions were crossed with a Magic torque wire and  a Kumpe catheter without difficulty and a 10-mm diameter angioplasty balloon was inflated in the innominate vein. A significant waist was taken, which resolved with approximately six atmospheres of pressure. Completion angiogram showed this area to now be patent with mild residual stenosis of only 20-30%.   I then turned my attention to the cephalic vein. This was treated initially with a 7-mm diameter conventional, 7-mm diameter high-pressure, and 8-mm diameter high-pressure angioplasty balloons but there was still significant stenosis. At this point, I up-sized to a 10-mm diameter angioplasty balloon and at this point, the waist was broken and there was only mild residual stenosis in the 10 to 20% range after angioplasty. At this point, I elected to terminate the procedure. The sheath was removed around a 4-0 Monocryl pursestring suture. Pressure was held. Sterile dressing was placed. The patient tolerated the procedure well and was taken to the recovery room in stable condition.     ____________________________ Algernon Huxley, MD jsd:[id number] D: 08/16/2011 11:10:21 ET T: 08/16/2011 12:18:24 ET JOB#: [id number]  cc: Algernon Huxley, MD, <Dictator> Dr. Graylon Gunning, nephrology, Andree Coss MD ELECTRONICALLY SIGNED 08/16/2011 16:20

## 2014-05-15 NOTE — ED Notes (Signed)
Report called to [facility] and given to Maudie Flakes, RN

## 2014-05-16 LAB — URINE CULTURE: Colony Count: 15000

## 2014-05-17 NOTE — H&P (Signed)
PATIENT NAME:  MIRKO, TAILOR MR#:  [id number] DATE OF BIRTH:  1945/02/26  DATE OF ADMISSION:  09/21/2012  ADMITTING PHYSICIAN: Max Sane, MD  PRIMARY CARE PHYSICIAN: Teresa Pelton, MD  PRIMARY CARE PHYSICIAN: Dr. Bridget Hartshorn at [facility] in [place]: Anthonette Legato, MD   REQUESTING PHYSICIAN: Leotis Pain, MD   CHIEF COMPLAINT: High potassium.   HISTORY OF PRESENT ILLNESS: The patient is a 69 year old year-old African American male with a known history of end-stage renal disease on hemodialysis, renal cell carcinoma, status post bilateral nephrectomy, hypertension is being admitted under observation for hyperkalemia. The patient was scheduled to get declotting of left upper extremity AV graft, and the procedure was canceled due to hyperkalemia. The patient is asymptomatic at this time.   PAST MEDICAL HISTORY: 1.  End-stage renal disease, on hemodialysis on Tuesday, Thursday and Saturday.  2.  Anemia of chronic kidney disease.  3.  Hypertension.  4.  History of MRSA dialysis catheter infection.  5.  History of renal cell cancer, status post bilateral nephrectomy.   PAST SURGICAL HISTORY:  1.  Bilateral nephrectomy.  2.  Peritoneal dialysis catheter placement.  3.  PermCath placement.   SOCIAL HISTORY: He lives with his wife. He is a disabled Higher education careers adviser. He has 2 children. No smoking or no drinking.   FAMILY HISTORY: Mother with kidney disease, and father died of unknown cancer in his 46s.   REVIEW OF SYSTEMS: CONSTITUTIONAL: No fever, fatigue, weakness.  EYES: No blurred or double vision.  EARS, NOSE AND THROAT: No tinnitus or ear pain.  RESPIRATORY: No cough, wheezing, hemoptysis.  CARDIOVASCULAR: No chest pain, orthopnea, edema. GASTROINTESTINAL: No nausea, vomiting or diarrhea. GENITOURINARY: No dysuria or hematuria. ENDOCRINE: No polyuria or nocturia.  HEMATOLOGIC: Positive for anemia of chronic kidney disease.  MUSCULOSKELETAL: No arthritis or muscle  cramp.  NEUROLOGIC:  No tingling, numbness, weakness.  PSYCHIATRIC:  No history of anxiety or depression.   MEDICATIONS AT HOME: 1.  Amlodipine 10 mg p.o. daily.  2.  Colcrys 1 tablet p.o. daily.  3.  Labetalol 200 mg p.o. b.i.d.  4.  Lisinopril 20 mg p.o. daily.  5.  Omeprazole 20 mg p.o. daily.  6.  Renagel 800 mg p.o. 3 times a day.  7.  Renaltab once daily.   8.  Sensipar 30 mg p.o. daily.   ALLERGIES: IODINE AND ADHESIVES.   PHYSICAL EXAMINATION: VITAL SIGNS: Temperature 98.2, heart rate 74 per minute, respirations 18 per minute, blood pressure 126/75 mmHg. He is saturating 96% on room.  GENERAL:  The patient is a 69 year old year-old African American male lying in the bed comfortably without any acute distress.  HEAD, EYES, EARS, NOSE, THROAT: Eyes: Pupils equal, round, reactive to light and accommodation. No scleral icterus. Extraocular muscles intact. HENT: Head is atraumatic, normocephalic. Oropharynx and nasopharynx clear.  NECK: Supple. No jugular venous distention. No thyroid enlargement or tenderness.  LUNGS: Clear to auscultation bilaterally. No wheezing, rales, rhonchi or crepitation.  CARDIOVASCULAR: S1, S2 normal. No murmurs, rubs or gallops.  ABDOMEN: Soft, nontender, nondistended. Bowel sounds present. No organomegaly or masses.  EXTREMITIES: No pedal edema, cyanosis or clubbing.  NEUROLOGIC: Nonfocal examination. Cranial nerves II through XII intact. Muscle strength 5 out of 5 in all extremities. Sensation intact.   PSYCHIATRIC:  The patient is alert and oriented x 3.  ACCESS:  He has right upper chest PermCath in place with no tenderness or redness around it. He also has a left upper extremity AV graft  in place which is not working.   LABORATORY PANEL: Potassium of 5.9.   IMPRESSION AND PLAN: 1.  Hyperkalemia: This should correct with hemodialysis which is planned urgently by Dr. Candiss Norse.  2.  End-stage renal disease, on hemodialysis Tuesday, Thursday, Saturday:   We will consult Nephrology for dialysis need.  3.  Left upper extremity AV graft issue requiring declotting which is scheduled for tomorrow by Dr. Lucky Cowboy.  4.  Hypertension: We will continue his home medications.   CODE STATUS: FULL CODE.   TOTAL TIME TAKING CARE OF THIS PATIENT: 35 minutes. ____________________________ Lucina Mellow. Manuella Ghazi, MD vss:cb D: 09/21/2012 16:10:47 ET T: 09/21/2012 16:42:32 ET JOB#: [id number]  cc: Loc Feinstein S. Manuella Ghazi, MD, <Dictator> Murlean Iba, MD Algernon Huxley, MD [facility], [place], Dr. Rondel Jumbo, MD  Lucina Mellow [facility] MD ELECTRONICALLY SIGNED 09/23/2012 14:46

## 2014-05-17 NOTE — Op Note (Signed)
PATIENT NAME:  Roger Gibson, Roger Gibson MR#:  [id number] DATE OF BIRTH:  1945/12/11  DATE OF PROCEDURE:  06/14/2012  PREOPERATIVE DIAGNOSES:  1. Complication of arteriovenous dialysis device.  2. End-stage renal disease, requiring hemodialysis.   POSTOPERATIVE DIAGNOSES:  1. Complication of arteriovenous dialysis device.  2. End-stage renal disease, requiring hemodialysis.  3. Central venous stenosis/superior vena cava syndrome.   PROCEDURES PERFORMED:  1. Contrast injection, left arm brachiocephalic fistula.  2. Percutaneous transluminal angioplasty of the left innominate vein.  3. Placement of a Life Star 14 mm x 6 cm stent for failed angioplasty.  4. Percutaneous transluminal angioplasty to 8 mm of the proximal cephalic vein.   PROCEDURE PERFORMED BY: Katha Cabal, MD  SEDATION: Versed plus fentanyl IV. Continuous ECG, pulse oximetry and cardiopulmonary monitoring is performed throughout the entire procedure by the interventional radiology nurse. Total sedation time was 1 hour.   ACCESS: A 7 French sheath, left arm brachiocephalic fistula.   CONTRAST USED: Isovue 55 mL.   FLUOROSCOPY TIME: 13.1 minutes.   INDICATIONS: Mr. Flett is still having difficulties with accessing his AV fistula and prolonged bleeding as well as increased recirculation times. He is therefore undergoing attempted salvage of his fistula. Risks and benefits were reviewed. All questions answered. The patient agrees to proceed.   DESCRIPTION OF PROCEDURE: The patient is taken to special procedures and placed in the supine position with his left arm extended palm upward. The left arm is prepped and draped in sterile fashion. Using a micropuncture kit, access is obtained to the fistula. Subsequently, the sheath is upsized to a 6 [place] sheath. Hand injection of contrast is then used to demonstrate the fistula as well as the central veins. Narrowing of the innominate vein throughout its course is noted. There is also a  narrowing of the cephalic vein near its confluence. Then, 4000 units of heparin is given, and a Magic Torque wire and KMP catheter are negotiated through the tandem stenoses. Initially, an 8 x 6 balloon is used to angioplasty the innominate. Minimal improvement is noted. Subsequently, a 10 mm balloon is utilized, and again minimal improvement is noted. Ultimately, the sheath is upsized to a 7 [place] sheath, and a 12 mm balloon is used to angioplasty the innominate. This yields only modest improvement, which is inadequate, and therefore a 14 x 60 Life Star stent is deployed and postdilated with a 12 mm balloon. This does yield a significant improvement, and now clinically, there is significantly less pulsatility to the fistula. There remains an area in the cephalic, and the 8 mm diameter balloon is reintroduced and used to angioplasty this area. Followup angiography demonstrates adequate result with approximately 10% to 15% residual stenosis. With the balloon inflated, reflux image is obtained, and the arterial anastomosis and visualized portion of the brachial arteries appear patent.   Pursestring suture is placed around the 7 [place] sheath. The 7 French sheath is removed. Light pressure is held, and there are no immediate complications.   INTERPRETATION: Initial views of the fistula demonstrate it is somewhat tortuous, has marked pseudoaneurysms noted at both venous and arterial cannulation sites. The area that has been treated in the past between the 2 zones appears to be patent at this time. Cephalic vein is noted to have a high-grade stenosis just distal to the confluence, and the innominate vein demonstrates a greater than 80% stenosis. The catheter is on the right side and appears to be in good position.   Following angioplasty of the  innominate, ultimately, a stent is placed. It is a 14 mm stent, a self-expanding Life Star, which is postdilated to 12 with an excellent result. The area in the cephalic  vein was then treated with the 8 mm balloon, and this yielded a good result.   It should be noted as an addendum, at the conclusion of treating the left-sided, the Glidewire and KMP catheter were negotiated into the right jugular vein and then the right subclavian vein. Both jugular veins and subclavian veins are stenotic, and the stenosis extends into the right innominate vein. Options for the right arm would be exclusive to a HeRO graft.   SUMMARY:  1. Successful angioplasty and stent placement of the left innominate vein.  2. Successful angioplasty of the cephalic vein for improved fistula function.  3. Central venous stenosis of the right side as well, suggesting HeRO graft would be the optimal therapy in the future.   ____________________________ Katha Cabal, MD ggs:OSi D: 06/20/2012 09:02:25 ET T: 06/20/2012 09:25:29 ET JOB#: [id number]  cc: Katha Cabal, MD, <Dictator> Katha Cabal MD ELECTRONICALLY SIGNED 06/28/2012 11:02

## 2014-05-17 NOTE — Op Note (Signed)
PATIENT NAME:  Roger Gibson, Roger Gibson MR#:  [id number] DATE OF BIRTH:  Feb 20, 1945  DATE OF PROCEDURE:  09/22/2012  PREOPERATIVE DIAGNOSES:  1.  End-stage renal disease.  2.  Clotted left arm arteriovenous fistula.  3.  Hyperkalemia requiring admission yesterday. 4.  Hypertension.   POSTOPERATIVE DIAGNOSES: 1.  End-stage renal disease.  2.  Clotted left arm arteriovenous fistula.  3.  Hyperkalemia requiring admission yesterday. 4.  Hypertension.   PROCEDURES:  1.  Ultrasound guidance to the left arm arteriovenous fistula.  2.  Left upper extremity fistulogram and central venogram.  3.  Catheter directed for thrombolysis with 4 mg of TPA delivered with the AngioJet AVX catheter.  4.  Mechanical rheolytic thrombectomy of the left arm arteriovenous fistula with the AngioJet AVX catheter.  5.  Percutaneous transluminal angioplasty of left innominate vein, subclavian vein, cephalic vein with 7 mm diameter angioplasty balloon.   SURGEON:  Algernon Huxley, M.D.   ANESTHESIA:  Local with moderate conscious sedation.   ESTIMATED BLOOD LOSS:  Minimal.   INDICATION FOR PROCEDURE:  This is a 69 year old African American male who has had a thrombosed fistula for about two weeks. He had been attempted to declot at an outside center. He was scheduled to have an attempt to declot here. He was hyperkalemic and admitted for dialysis through his PermCath. He desired an attempt to salvage this fistula if at all possible and was informed that after the time that had past, his fistula may not be salvageable but that we would attempt this today. The risks and benefits were discussed. Informed consent was obtained.   DESCRIPTION OF PROCEDURE:  The patient is brought to the Vascular Interventional Radiology Suite. The left upper extremity was sterilely prepped and draped, and a sterile surgical field was created. The fistula was accessed just a centimeter or two beyond the arterial anastomosis. Under direct ultrasound  guidance, the micropuncture needle, and micropuncture wire and sheath were then placed. Imaging through the micropuncture sheath showed a completely thrombosed fistula. 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 I placed a 6-French sheath with the help of a Kumpe catheter, got a Glidewire to cross all the central venous occlusion. There was a stent in the cephalic vein just beyond the shoulder. There is also a stent in the innominate vein. Essentially no forward flow was initially seen. I was able to traverse through this occlusion. This was not particularly easy and this had likely been there for some time clinically as well. A Kumpe catheter was parked in the superior vena cava and intraluminal flow was confirmed, and I placed a Magic torque wire. Then 4 mg of TPA was delivered throughout the cephalic vein through the subclavian vein and the innominate vein. This was allowed to dwell for 15 minutes. Mechanical rheolytic thrombectomy was then performed with two separate passes throughout this entire area. This resulted in restoration of some flow in the upper arm, although there was still thrombus present within the aneurysmal portion of the fistula; however, the subclavian vein and innominate vein remained thrombosed. Balloon angioplasty was performed with a 7-mm diameter angioplasty balloon from the innominate vein back through the subclavian vein and the near entire cephalic vein. There were very large chronic-appearing thrombus in the subclavian and innominate systems at this point. I ran another pass with the AngioJet catheter with no improvement whatsoever. At this point, it was cleared that these were not going to improve and any further attempts would just dislodge these and  create large pulmonary emboli and I did not feel this was safe. At this point I elected to terminate the procedure. The sheath was removed around a 4-0 Monocryl pursestring suture. Pressure was held. A sterile dressing was  placed discussed. It was discussed with the patient that we will began evaluating for access in the right upper extremity as an outpatient.  ____________________________  Algernon Huxley, MD jsd:jm D: 09/22/2012 09:51:50 ET T: 09/22/2012 10:29:12 ET JOB#: [id number]  cc: Algernon Huxley, MD, <Dictator> Algernon Huxley MD ELECTRONICALLY SIGNED 09/27/2012 16:16

## 2014-05-17 NOTE — Op Note (Signed)
PATIENT NAME:  Roger Gibson, Roger Gibson MR#:  [id number] DATE OF BIRTH:  May 16, 1945  DATE OF OPERATION:  10/11/2012  PREOPERATIVE DIAGNOSES: 1.  End-stage renal disease.  2.  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  3.  Hypertension.   POSTOPERATIVE DIAGNOSES: 1.  End-stage renal disease.  2.  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  3.  Hypertension.   PROCEDURE: Right loop forearm arteriovenous graft.   SURGEON: Dew.   ANESTHESIA: General.   ESTIMATED BLOOD LOSS: Approximately 50 mL.   INDICATION FOR PROCEDURE: This is a 69 year old male with end-stage renal disease. He had a left arm AV fistula that partially declotted the central venous system on the left. It was  thrombosed and occluded and this will include durable access on the left. For these reasons we are now working out for a right arm access. He did not appear to have an adequate superficial vein on a noninvasive study, and he was brought in for graft placement.   Risks and benefits were discussed and informed consent was obtained.   DESCRIPTION OF PROCEDURE: The patient was brought to the operative suite and after an adequate level of general anesthesia was obtained, the right extremity was sterilely prepped and draped and a sterile surgical field was created. An incision was created at the antecubital fossa, and we dissected down to the brachial artery. Initially the cephalic vein at this location looked like it would likely be a usable vein. However, when we probed this with dilators the vein was very thickened and would not easily pass, even a 2.5 mm dilator. I dissected it up a couple of centimeters try to evaluate the vein. This was very thick-walled, unhealthy-appearing vein and would not be good for fistula creation.   At this point, we planned graft creation. The brachial vein was adequate for a loop forearm AV graft and was it dissected out, with 2 venous  branches ligated and divided between silk ties. The 6 mm standard-wall Propaten PTFE graft was then tunneled. The patient was heparinized with 4000 units of intravenous heparin. Control was pulled up on the vessel loops. An anterior wall arteriotomy was created with an 11-blade and extended with Potts scissors. The graft was then cut and beveled to match the appropriate length of the arteriotomy and the arterial anastomosis was created with a running CV-6 suture in the usual fashion.   The vein was then prepared, controlled with bulldog clamps. An anterior wall venotomy was created with an 11-blade and extended with Potts scissors, and a CV-6 suture was used to create an anastomosis on the vein as well. The vessel was flushed and de-aired prior to releasing control. Several minutes of pressure were held which controlled the needle-hole bleeding. The counter-incision in the forearm was closed with 3-0 Vicryl and 4-0 Monocryl. Surgicel and Evicel topical hemostatic agents were placed, and hemostasis was complete. The wound was then closed with running 3-0 Vicryl and 4-0 Monocryl. Dermabond was placed as a dressing. The patient tolerated the procedure well and was taken to the recovery room in stable condition.     ____________________________ Algernon Huxley, MD jsd:dm D: 10/11/2012 13:40:00 ET T: 10/11/2012 13:57:12 ET JOB#: [id number]  cc: Algernon Huxley, MD, <Dictator> Algernon Huxley MD ELECTRONICALLY SIGNED 10/18/2012 11:39

## 2014-05-17 NOTE — Op Note (Signed)
PATIENT NAME:  Roger Gibson, Roger Gibson MR#:  [id number] DATE OF BIRTH:  06-Sep-1945  DATE OF PROCEDURE:  05/26/2012  PREOPERATIVE DIAGNOSES: 1.  Complication of arteriovenous dialysis device.  2.  End-stage renal disease requiring hemodialysis.   POSTOPERATIVE DIAGNOSES:  1.  Complication of arteriovenous dialysis device.  2.  End-stage renal disease requiring hemodialysis.   PROCEDURES PERFORMED: 1.  Contrast injection, left arm brachiocephalic fistula.  2.  Percutaneous transluminal angioplasty left subclavian vein and cephalic vein at the confluence.  3.  Percutaneous transluminal angioplasty and stent placement for failed angioplasty of the venous portion of the left arm fistula.  4.  Percutaneous transluminal angioplasty of the arterial portion.  5.  Mechanical thrombectomy of residual thrombus.   SURGEON: Hortencia Pilar, M.D.   SEDATION: Versed plus fentanyl. Continuous ECG, pulse oximetry and cardiopulmonary monitoring is performed throughout the entire procedure by the interventional radiology nurse. Total sedation time is 1 hour.   ACCESS: 8-French sheath antegrade direction, left arm fistula.   CONTRAST USED: Isovue 75 mL.   FLUOROSCOPY TIME: 10.8 minutes.   INDICATIONS: Mr. Roger Gibson is a 69 year old gentleman who is sent to the hospital by his dialysis center with a history of failed attempt at declotting his left arm fistula on April 29th. I am asked to evaluate and treat. The risks and benefits were reviewed, all questions answered. The patient agrees to proceed.   DESCRIPTION OF PROCEDURE: The patient is taken to the special procedures suite and placed in the supine position. After adequate sedation is achieved, the patient is positioned supine with his left arm extended palm upward. The left arm is prepped and draped in a sterile fashion. One percent lidocaine is infiltrated in the soft tissues, and access to the fistula is obtained with a Micropuncture needle in an antegrade  direction near the arterial anastomosis, a Microwire followed by a Microsheath, J-wire followed by a 6-French sheath. Hand injection of contrast is then used to demonstrate the fistula. There are 4 distinct stenoses. There are also stenoses within the central veins, but they appear to be on the order of 50% and should not be flow-limiting at this time. Four thousand 4000 units of heparin is given.   Using a KMP catheter and Glidewire, the greater than 95% stenosis in the venous portion is negotiated and the catheter wire advanced. This is actually the point at which adequate central vein venous imaging was obtained through the KMP catheter under direct visualization. Subsequently, a Magic torque wire was advanced through the catheter, and then a 7 x 4 [place] balloon was advanced across the lesion and used to balloon angioplasty this area. The balloon was then advanced across the subclavian confluence, and angioplasty was performed at that level as well. Angioplasty pressures are between 16 and 20, and the length of inflation was 1 minute. Follow-up demonstrated minimal, if any, improvement at the greater than 95% lesion in the venous portion; and therefore, the sheath is upsized to an 8-French and 2 Viabahn stents 7 mm in diameter, the first one 5 cm in length, the second was 2.5 cm in length, are placed across this lesion and postdilated using a #9 balloon. It is a Dorado balloon. Inflation is to 12 atmospheres for 30 seconds within the stents.  The balloon is then advanced into the subclavian confluence, and the 9 x 4 balloon is used to serial angioplasty the subclavian confluence. Two separate angioplasties are performed, each for 1 minute, each to 22 atmospheres. Follow-up imaging now demonstrates that  the venous lesion is well treated with the stents that are completely expanded, and the venous and the cephalic subclavian confluence is now widely patent. Imaging of the arterial portion now demonstrates  retained thrombus, and a Trerotola device is opened on the field. Several passes are made with the Trerotola, and this eliminates the residual thrombus. Just in front of the sheath in the arterial portion proximal to the arterial cannulation site, there is again a greater than 90% stenosis, and this is treated with a 9 x 4 [place] balloon.  Two separate inflations are made, the first to 16 atmospheres, the second to 20+ atmospheres, and follow-up imaging demonstrates that although still present it appears to be significantly improved and does appear to be allowing an in-flow of approximately 5 to 6 mm which should be more than adequate to sustain flow throughout the fistula.   Pursestring suture of 4-0 Monocryl is placed around the sheath.  The sheath was pulled. Pressure was held, and there were no immediate complications.   INTERPRETATION: Initial views of the fistula demonstrate significant stenosis in the arterial portion associated with  pseudoaneurysmal deterioration of the vein itself, but there are separate and distinct multiple lesions, the worst of which is greater than 95%. The most central is at the subclavian cephalic confluence. After treatment with angioplasty, and in the venous lesions case Viabahn stenting, there is now adequate improvement at the arterial portion, excellent improvement at the venous portion an excellent improvement at the cephalic confluence.   The patient will undergo dialysis via his catheter which was placed on the 29th by the [facility] group, and subsequently he will undergo follow-up duplex ultrasound of his fistula.  Should the ultrasound show adequate velocities without marked elevations, then he will begin using his fistula again; however, should the duplex ultrasound demonstrate persistent tripling of the velocities in the arterial portion, then clearly a stand-alone angioplasty will not suffice, and prior to initiating use with his fistula, he should undergo  stenting.   ____________________________ Katha Cabal, MD ggs:cb D: 05/28/2012 14:07:36 ET T: 05/28/2012 15:48:42 ET JOB#: [id number]  cc: Katha Cabal, MD, <Dictator> Joyice Faster. Deterding, MD Katha Cabal MD ELECTRONICALLY SIGNED 06/05/2012 13:50

## 2014-05-17 NOTE — Discharge Summary (Signed)
PATIENT NAME:  Roger Gibson, Roger Gibson MR#:  [id number] DATE OF BIRTH:  09-07-1945  DATE OF ADMISSION:  09/21/2012 DATE OF DISCHARGE:  09/22/2012  DISCHARGE DIAGNOSES: 1.  Hyperkalemia.  2.  End-stage renal disease on hemodialysis.  3.  Malfunctioning hemodialysis access status post fistulogram.  4.  Hypertension.   CONDITION ON DISCHARGE:  Stable.   CODE STATUS:  FULL CODE.   MEDICATIONS ON DISCHARGE: 1.  Labetalol 200 mg oral tablet 2 times a day.  2.  Renagel 800 mg oral tablet 3 times a day.  3.  Sensipar 30 mg once a day.  4.  Omeprazole 20 mg delayed-release capsule once a day.  5.  Lisinopril 20 mg once a day.  6.  Amlodipine 10 mg once a day.   DIET ON DISCHARGE:  Renal diet.  Diet consistency regular.   ACTIVITY:  As tolerated.   TIMEFRAME TO FOLLOW-UP:  Within 1 to 2 weeks with primary care physician as routine.    HISTORY OF PRESENT ILLNESS:  A 69 year old African American male with known history of end-stage renal disease on hemodialysis, renal cell carcinoma status post bilateral nephrectomy, was admitted for hyperkalemia.  He was scheduled to get declotting of left upper extremity AV graft, procedure was canceled due to hyperkalemia and the patient was asymptomatic at that time, but admitted for hyperkalemia and potassium level was high on admission and so urgent dialysis was done.  The patient was on observation.  The next day morning fistulogram was done and Dr. Lucky Cowboy reported the fistula is unsalvageable and the patient already had a Perm-A-Cath so continued getting his dialysis which was continued.   HOSPITAL COURSE AND STAY:  As mentioned above, the patient stayed stable, received hemodialysis and fistulogram was done.   OTHER MEDICAL ISSUES:  Hypertension.  Continued home medication and blood pressure remained stable.   IMPORTANT LABORATORY RESULTS IN THE HOSPITAL:  Potassium was 5.9 on admission and phosphorus was 7.8 and after dialysis the next day morning potassium came  down to 4.8.  Magnesium was 2.   Total time spent in this discharge 35 minutes.    ____________________________ Ceasar Lund Anselm Jungling, MD vgv:ea D: 09/26/2012 22:17:29 ET T: 09/26/2012 23:23:09 ET JOB#: [id number]  cc: Ceasar Lund. Anselm Jungling, MD, <Dictator> Vaughan Basta MD ELECTRONICALLY SIGNED 09/29/2012 19:06

## 2014-05-19 NOTE — Op Note (Signed)
PATIENT NAME:  Roger Gibson, Roger Gibson MR#:  [id number] DATE OF BIRTH:  05/18/1945  DATE OF PROCEDURE:  04/12/2011  PREOPERATIVE DIAGNOSES:  1. Endstage renal disease.  2. Poorly functioning left brachiocephalic AV fistula.  3. Hypertension.  4. Hypercholesterolemia.  POSTOPERATIVE DIAGNOSES:  1. Endstage renal disease.  2. Poorly functioning left brachiocephalic AV fistula.  3. Hypertension.  4. Hypercholesterolemia.  PROCEDURE: 1. Ultrasound guidance for vascular access to left brachiocephalic AV fistula.  2. Left upper extremity fistulogram and central venogram.  3. Percutaneous transluminal angioplasty of two upper arm cephalic vein stenoses with a 7-mm diameter angioplasty balloon.  4. Percutaneous transluminal angioplasty of stenosis just beyond the anastomosis with a 6-mm diameter angioplasty balloon.   SURGEON: Algernon Huxley, M.D.   ANESTHESIA: Local with moderate conscious sedation.   ESTIMATED BLOOD LOSS: Approximately 25 mL.  FLUOROSCOPY TIME: Two minutes.   CONTRAST:  40 mL.   INDICATION FOR PROCEDURE: This is a 69 year old African American male with endstage renal disease. His left brachiocephalic AV fistula was working for dialysis for some time. However, last week they were unable to gain satisfactory flow and had switched back a PermCath. Noninvasive study had previously shown areas of stenosis both just beyond the anastomosis as well as in the upper arm cephalic vein. Fistulogram was recommended for evaluation and possible treatment. Risks and benefits were discussed. Informed consent was obtained.   DESCRIPTION OF PROCEDURE: The patient was brought to the vascular interventional radiology suite. The left upper extremity was sterilely prepped and draped and a sterile surgical field was created. Ultrasound was used to access the vein between the stenoses apparent on ultrasound in the mid to distal upper arm. This was done under direct ultrasound guidance without difficulty and  permanent image was recorded. A micropuncture sheath was placed. Imaging through this showed two stenoses within the mid to upper upper arm cephalic vein. They were in close apposition and were likely vein valve stenosed within 3 to 4 cm of one another. The remainder of the central venous circulation appeared patent with a PermCath in place. I up-sized to a 6 [place] sheath and gave 2500 units of intravenous heparin for systemic anticoagulation.  I crossed these lesions without difficulty with a Magic torque wire and performed percutaneous transluminal angioplasty with a 7-mm diameter angioplasty balloon. While the balloon was inflated, injection was performed to evaluate the arterial side and the anastomosis. The anastomosis itself was patent but within a couple centimeters beyond the anastomosis was a tapered stenosis in the 70 to 80% range. After balloon inflation in the upper arm area cephalic vein there was only mild residual stenosis which was not flow-limiting. I was then able to flip the sheath around with the help of a stiff angled Glidewire. I crossed this lesion without difficulty with a stiff angled Glidewire and advanced the sheath and exchanged for a Magic torque wire. I selected a 6-mm diameter x 6-cm length angioplasty balloon and inflated this from the arterial anastomosis back into the proximal portion of the fistula, encompassing the stenosis. A waist was taken, which resolved. Completion angiogram following this showed again only mild residual stenosis in the 20% range, and I elected to terminate the procedure. The sheath was removed around a 4-0 Monocryl pursestring suture. Pressure was held. Sterile dressing was placed. The patient tolerated the procedure well and was taken to the recovery room in stable condition.   ____________________________ Algernon Huxley, MD jsd:bjt D: 04/12/2011 11:42:22 ET T: 04/12/2011 13:28:51 ET JOB#: [id number]  cc: Algernon Huxley, MD, <Dictator> Algernon Huxley  MD ELECTRONICALLY SIGNED 04/12/2011 17:18

## 2014-05-21 LAB — CULTURE, BLOOD (ROUTINE X 2)
Culture: NO GROWTH
Culture: NO GROWTH

## 2014-07-22 ENCOUNTER — Other Ambulatory Visit: Payer: Self-pay

## 2014-12-02 ENCOUNTER — Encounter: Payer: Self-pay | Admitting: Internal Medicine

## 2015-10-08 DIAGNOSIS — Z7982 Long term (current) use of aspirin: Secondary | ICD-10-CM | POA: Diagnosis not present

## 2015-10-08 DIAGNOSIS — I10 Essential (primary) hypertension: Secondary | ICD-10-CM | POA: Diagnosis not present

## 2015-10-08 DIAGNOSIS — R69 Illness, unspecified: Secondary | ICD-10-CM | POA: Diagnosis not present

## 2015-10-08 DIAGNOSIS — Z4822 Encounter for aftercare following kidney transplant: Secondary | ICD-10-CM | POA: Diagnosis not present

## 2015-10-08 DIAGNOSIS — Z940 Kidney transplant status: Secondary | ICD-10-CM | POA: Diagnosis not present

## 2015-10-08 DIAGNOSIS — Z85528 Personal history of other malignant neoplasm of kidney: Secondary | ICD-10-CM | POA: Diagnosis not present

## 2015-10-08 DIAGNOSIS — D649 Anemia, unspecified: Secondary | ICD-10-CM | POA: Diagnosis not present

## 2015-10-08 DIAGNOSIS — I509 Heart failure, unspecified: Secondary | ICD-10-CM | POA: Diagnosis not present

## 2015-10-08 DIAGNOSIS — Z79899 Other long term (current) drug therapy: Secondary | ICD-10-CM | POA: Diagnosis not present

## 2015-10-08 DIAGNOSIS — Z905 Acquired absence of kidney: Secondary | ICD-10-CM | POA: Diagnosis not present

## 2015-10-15 ENCOUNTER — Ambulatory Visit (HOSPITAL_COMMUNITY)
Admission: EM | Admit: 2015-10-15 | Discharge: 2015-10-15 | Disposition: A | Discharge status: Home / Self Care | Payer: Medicare HMO | Attending: Family Medicine | Admitting: Family Medicine

## 2015-10-15 ENCOUNTER — Encounter (HOSPITAL_COMMUNITY): Payer: Self-pay | Admitting: Emergency Medicine

## 2015-10-15 DIAGNOSIS — B029 Zoster without complications: Secondary | ICD-10-CM | POA: Diagnosis not present

## 2015-10-15 MED ORDER — VALACYCLOVIR HCL 1 G PO TABS: 1000.0000 mg | ORAL_TABLET | Freq: Three times a day (TID) | ORAL | 0 refills | Status: AC

## 2015-10-15 NOTE — ED Provider Notes (Signed)
CSN: [id number]     Arrival date & time 10/15/15  1512 History   First MD Initiated Contact with Patient 10/15/15 1635     Chief Complaint  Patient presents with  . Rash   (Consider location/radiation/quality/duration/timing/severity/associated sxs/prior Treatment) The history is provided by the patient.  Rash  Location:  Face Facial rash location:  R eyebrow (Right lower forehead) Quality: itchiness, redness and scaling   Quality: not blistering, not bruising, not burning, not dry and not painful   Quality comment:  Itchiness is mild Severity:  Mild Onset quality:  Sudden Duration:  4 days Progression:  Unchanged Chronicity:  New Context: not animal contact, not chemical exposure, not exposure to similar rash, not food, not insect bite/sting, not medications, not new detergent/soap, not plant contact, not pollen and not sick contacts   Relieved by:  Nothing Worsened by:  Nothing Ineffective treatments:  Antibiotic cream Associated symptoms: no abdominal pain, no diarrhea, no fatigue, no fever, no headaches, no nausea, no shortness of breath and not vomiting     Past Medical History:  Diagnosis Date  . Allergy   . Blood transfusion without reported diagnosis   . Cancer ([place])    right kidney ca  . Confusion    during admission to [facility] 2012, persisted after discharge  . ESRD (end stage renal disease) ([place])   . ESRD (end stage renal disease) on dialysis ([place])    T, Th, Sat HD in [place], [place]  . Gout 01/2002   Right knee; Left great toe  . HLD (hyperlipidemia) 1999  . HTN (hypertension) 1974  . Iron deficiency anemia    PRE 2004/06/2000  . Renal cysts, acquired, bilateral    per Darol Destine   Past Surgical History:  Procedure Laterality Date  . [place]  05/15/03   Polyp (biopsy negative) repeat in 5 years  . COLONOSCOPY  07/18/08   1 rectal polyp (Dr. Wynetta Emery) 5 years  . CT ABD  W & PELVIS WO CM  03/03/06   Right kidney absent; Left with cystic changes  . CYSTOSCOPY  03/03/06   Mild to mod. BPH observed/ otherwise normal  . 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 MR MRA ABDOMEN  2000   3x3 mass left kidney  . NEPHRECTOMY  1965   Left (infection) [place]  . NEPHRECTOMY  2000   partial-(2nd to renal carcinoma) Dr. [place] [facility]   Family History  Problem Relation Age of Onset  . Stomach cancer Father     ?  Marland Kitchen Hypertension Mother     On dialysis for years  . Hypertension Brother   . Hypertension Brother   . Hypertension Brother   . Hypertension Sister   . Breast cancer Sister     s/p mastectomy  . COPD Sister   . Hypertension Sister   . Kidney failure Sister     on dialysis  . Heart attack Paternal Grandfather   . Colon cancer Neg Hx   . Esophageal cancer Neg Hx   . Rectal cancer Neg Hx    Social History  Substance Use Topics  . Smoking status: Former Smoker    Types: Cigarettes, Cigars    Quit date: 01/25/1998  . Smokeless tobacco: Never Used     Comment: occasional cigar  . Alcohol use No  Comment: very rare    Review of Systems  Constitutional: Negative for chills, fatigue and fever.  Respiratory: Negative for cough and shortness of breath.   Gastrointestinal: Negative for abdominal pain, diarrhea, nausea and vomiting.  Skin: Positive for rash.  Neurological: Negative for dizziness, weakness and headaches.    Allergies  Iodinated diagnostic agents; Iodine; and Tape  Home Medications   Prior to Admission medications   Medication Sig Start Date End Date Taking? Authorizing Provider  furosemide (LASIX) 20 MG tablet Take 40 mg by mouth 2 (two) times daily.   Yes Historical Provider, MD  labetalol (NORMODYNE) 100 MG tablet Take 100 mg by mouth 2 (two) times daily.   Yes Historical Provider, MD  Multiple Vitamin (MULTIVITAMIN WITH  MINERALS) TABS tablet Take 1 tablet by mouth daily.   Yes Historical Provider, MD  mycophenolate (MYFORTIC) 180 MG EC tablet Take 540 mg by mouth 2 (two) times daily.   Yes Historical Provider, MD  omeprazole (PRILOSEC) 20 MG capsule Take 20 mg by mouth daily.   Yes Historical Provider, MD  predniSONE (DELTASONE) 5 MG tablet Take 25 mg by mouth daily with breakfast.   Yes Historical Provider, MD  sertraline (ZOLOFT) 50 MG tablet Take 50 mg by mouth daily.   Yes Historical Provider, MD  sulfamethoxazole-trimethoprim (BACTRIM DS,SEPTRA DS) 800-160 MG per tablet Take 1 tablet by mouth every Monday, Wednesday, and Friday.   Yes Historical Provider, MD  tacrolimus (PROGRAF) 1 MG capsule Take 7 mg by mouth 2 (two) times daily.   Yes Historical Provider, MD  tamsulosin (FLOMAX) 0.4 MG CAPS capsule Take 0.4 mg by mouth daily after supper.   Yes Historical Provider, MD  valGANciclovir (VALCYTE) 450 MG tablet Take 900 mg by mouth daily with supper.   Yes Historical Provider, MD  valACYclovir (VALTREX) 1000 MG tablet Take 1 tablet (1,000 mg total) by mouth 3 (three) times daily. 10/15/15 10/22/15  Barry Dienes, NP   Meds Ordered and Administered this Visit  Medications - No data to display  BP 139/81 (BP Location: Left Arm)   Pulse 62   Temp 97.3 F (36.3 C) (Oral)   SpO2 96%  No data found.   Physical Exam  Constitutional: He is oriented to person, place, and time. He appears well-developed and well-nourished. No distress.  HENT:  Head: Normocephalic and atraumatic.  Right Ear: External ear normal.  Left Ear: External ear normal.  Nose: Nose normal.  Mouth/Throat: Oropharynx is clear and moist. No oropharyngeal exudate.  Eyes: Conjunctivae and EOM are normal. Pupils are equal, round, and reactive to light. Right eye exhibits no discharge. Left eye exhibits no discharge.  Neck: Normal range of motion. Neck supple.  Cardiovascular: Normal rate, regular rhythm and normal heart sounds.     Pulmonary/Chest: Effort normal and breath sounds normal. No respiratory distress. He has no wheezes.  Lymphadenopathy:    He has no cervical adenopathy.  Neurological: He is alert and oriented to person, place, and time.  Skin:  See picture below, no drainage present.    Nursing note and vitals reviewed.     Urgent Care Course   Clinical Course    Procedures (including critical care time)  Labs Review Labs Reviewed - No data to display  Imaging Review No results found.    MDM   1. Shingles    Rash appears to a dried shingle. Will tx with valacyclovir '1000mg'$  TID x 7 days. Patient informed to f/u with PCP if the rash does  not improve. Informed to f/u earlier if the rash worsen on the medication. All questions answered. Discharge instruction given.    Barry Dienes, NP 10/15/15 1708

## 2015-10-15 NOTE — ED Triage Notes (Signed)
Pt woke up with some swelling above his right eye and a rash on his right forehead.  Pt denies any pain or itching or fever.  He only states that the swelling is worse in the morning, but it decreases during the day.  He does not report any vision changes or injury to the head.

## 2015-12-16 DIAGNOSIS — H10413 Chronic giant papillary conjunctivitis, bilateral: Secondary | ICD-10-CM | POA: Diagnosis not present

## 2015-12-16 DIAGNOSIS — H35372 Puckering of macula, left eye: Secondary | ICD-10-CM | POA: Diagnosis not present

## 2015-12-16 DIAGNOSIS — H2513 Age-related nuclear cataract, bilateral: Secondary | ICD-10-CM | POA: Diagnosis not present

## 2015-12-16 DIAGNOSIS — H43813 Vitreous degeneration, bilateral: Secondary | ICD-10-CM | POA: Diagnosis not present

## 2016-01-07 DIAGNOSIS — I151 Hypertension secondary to other renal disorders: Secondary | ICD-10-CM | POA: Diagnosis not present

## 2016-01-07 DIAGNOSIS — D899 Disorder involving the immune mechanism, unspecified: Secondary | ICD-10-CM | POA: Diagnosis not present

## 2016-01-07 DIAGNOSIS — Z940 Kidney transplant status: Secondary | ICD-10-CM | POA: Diagnosis not present

## 2016-01-07 DIAGNOSIS — N2889 Other specified disorders of kidney and ureter: Secondary | ICD-10-CM | POA: Diagnosis not present

## 2016-03-11 DIAGNOSIS — Z1389 Encounter for screening for other disorder: Secondary | ICD-10-CM | POA: Diagnosis not present

## 2016-03-11 DIAGNOSIS — Z0000 Encounter for general adult medical examination without abnormal findings: Secondary | ICD-10-CM | POA: Diagnosis not present

## 2016-04-08 DIAGNOSIS — Z23 Encounter for immunization: Secondary | ICD-10-CM | POA: Diagnosis not present

## 2016-04-08 DIAGNOSIS — I10 Essential (primary) hypertension: Secondary | ICD-10-CM | POA: Diagnosis not present

## 2016-04-08 DIAGNOSIS — Z4822 Encounter for aftercare following kidney transplant: Secondary | ICD-10-CM | POA: Diagnosis not present

## 2016-04-08 DIAGNOSIS — N1330 Unspecified hydronephrosis: Secondary | ICD-10-CM | POA: Diagnosis not present

## 2016-04-08 DIAGNOSIS — Z79899 Other long term (current) drug therapy: Secondary | ICD-10-CM | POA: Diagnosis not present

## 2016-04-08 DIAGNOSIS — B349 Viral infection, unspecified: Secondary | ICD-10-CM | POA: Diagnosis not present

## 2016-04-08 DIAGNOSIS — Z792 Long term (current) use of antibiotics: Secondary | ICD-10-CM | POA: Diagnosis not present

## 2016-04-08 DIAGNOSIS — D899 Disorder involving the immune mechanism, unspecified: Secondary | ICD-10-CM | POA: Diagnosis not present

## 2016-04-08 DIAGNOSIS — T8619 Other complication of kidney transplant: Secondary | ICD-10-CM | POA: Diagnosis not present

## 2016-04-08 DIAGNOSIS — R609 Edema, unspecified: Secondary | ICD-10-CM | POA: Diagnosis not present

## 2016-04-08 DIAGNOSIS — N135 Crossing vessel and stricture of ureter without hydronephrosis: Secondary | ICD-10-CM | POA: Diagnosis not present

## 2016-04-08 DIAGNOSIS — Z5181 Encounter for therapeutic drug level monitoring: Secondary | ICD-10-CM | POA: Diagnosis not present

## 2016-04-08 DIAGNOSIS — Z940 Kidney transplant status: Secondary | ICD-10-CM | POA: Diagnosis not present

## 2016-04-08 DIAGNOSIS — Z905 Acquired absence of kidney: Secondary | ICD-10-CM | POA: Diagnosis not present

## 2016-05-20 DIAGNOSIS — Z5181 Encounter for therapeutic drug level monitoring: Secondary | ICD-10-CM | POA: Diagnosis not present

## 2016-05-20 DIAGNOSIS — Z940 Kidney transplant status: Secondary | ICD-10-CM | POA: Diagnosis not present

## 2016-05-31 DIAGNOSIS — Z940 Kidney transplant status: Secondary | ICD-10-CM | POA: Diagnosis not present

## 2016-08-31 DIAGNOSIS — I151 Hypertension secondary to other renal disorders: Secondary | ICD-10-CM | POA: Diagnosis not present

## 2016-08-31 DIAGNOSIS — Z5181 Encounter for therapeutic drug level monitoring: Secondary | ICD-10-CM | POA: Diagnosis not present

## 2016-08-31 DIAGNOSIS — N2889 Other specified disorders of kidney and ureter: Secondary | ICD-10-CM | POA: Diagnosis not present

## 2016-08-31 DIAGNOSIS — Z940 Kidney transplant status: Secondary | ICD-10-CM | POA: Diagnosis not present

## 2016-08-31 DIAGNOSIS — D899 Disorder involving the immune mechanism, unspecified: Secondary | ICD-10-CM | POA: Diagnosis not present

## 2016-08-31 DIAGNOSIS — Z125 Encounter for screening for malignant neoplasm of prostate: Secondary | ICD-10-CM | POA: Diagnosis not present

## 2016-08-31 DIAGNOSIS — Z4822 Encounter for aftercare following kidney transplant: Secondary | ICD-10-CM | POA: Diagnosis not present

## 2016-08-31 DIAGNOSIS — Z79899 Other long term (current) drug therapy: Secondary | ICD-10-CM | POA: Diagnosis not present

## 2016-09-06 DIAGNOSIS — R69 Illness, unspecified: Secondary | ICD-10-CM | POA: Diagnosis not present

## 2016-09-14 DIAGNOSIS — R69 Illness, unspecified: Secondary | ICD-10-CM | POA: Diagnosis not present

## 2016-09-25 DIAGNOSIS — R29818 Other symptoms and signs involving the nervous system: Secondary | ICD-10-CM

## 2016-09-25 HISTORY — DX: Other symptoms and signs involving the nervous system: R29.818

## 2016-10-21 ENCOUNTER — Emergency Department (HOSPITAL_COMMUNITY): Payer: Medicare HMO

## 2016-10-21 ENCOUNTER — Inpatient Hospital Stay (HOSPITAL_COMMUNITY): Payer: Medicare HMO

## 2016-10-21 ENCOUNTER — Encounter (HOSPITAL_COMMUNITY): Payer: Self-pay | Admitting: Family Medicine

## 2016-10-21 ENCOUNTER — Inpatient Hospital Stay (HOSPITAL_COMMUNITY)
Admission: EM | Admit: 2016-10-21 | Discharge: 2016-11-25 | DRG: 025 | Disposition: E | Payer: Medicare HMO | Attending: Internal Medicine | Admitting: Internal Medicine

## 2016-10-21 DIAGNOSIS — R1915 Other abnormal bowel sounds: Secondary | ICD-10-CM

## 2016-10-21 DIAGNOSIS — G8194 Hemiplegia, unspecified affecting left nondominant side: Secondary | ICD-10-CM | POA: Diagnosis present

## 2016-10-21 DIAGNOSIS — R29810 Facial weakness: Secondary | ICD-10-CM | POA: Diagnosis present

## 2016-10-21 DIAGNOSIS — I82629 Acute embolism and thrombosis of deep veins of unspecified upper extremity: Secondary | ICD-10-CM | POA: Diagnosis not present

## 2016-10-21 DIAGNOSIS — R609 Edema, unspecified: Secondary | ICD-10-CM

## 2016-10-21 DIAGNOSIS — C3490 Malignant neoplasm of unspecified part of unspecified bronchus or lung: Secondary | ICD-10-CM | POA: Diagnosis not present

## 2016-10-21 DIAGNOSIS — R6510 Systemic inflammatory response syndrome (SIRS) of non-infectious origin without acute organ dysfunction: Secondary | ICD-10-CM

## 2016-10-21 DIAGNOSIS — R0902 Hypoxemia: Secondary | ICD-10-CM

## 2016-10-21 DIAGNOSIS — G936 Cerebral edema: Secondary | ICD-10-CM | POA: Diagnosis not present

## 2016-10-21 DIAGNOSIS — Z66 Do not resuscitate: Secondary | ICD-10-CM | POA: Diagnosis not present

## 2016-10-21 DIAGNOSIS — Z9911 Dependence on respirator [ventilator] status: Secondary | ICD-10-CM | POA: Diagnosis not present

## 2016-10-21 DIAGNOSIS — I130 Hypertensive heart and chronic kidney disease with heart failure and stage 1 through stage 4 chronic kidney disease, or unspecified chronic kidney disease: Secondary | ICD-10-CM | POA: Diagnosis not present

## 2016-10-21 DIAGNOSIS — R740 Nonspecific elevation of levels of transaminase and lactic acid dehydrogenase [LDH]: Secondary | ICD-10-CM | POA: Diagnosis not present

## 2016-10-21 DIAGNOSIS — Z905 Acquired absence of kidney: Secondary | ICD-10-CM

## 2016-10-21 DIAGNOSIS — K8020 Calculus of gallbladder without cholecystitis without obstruction: Secondary | ICD-10-CM | POA: Diagnosis present

## 2016-10-21 DIAGNOSIS — J95821 Acute postprocedural respiratory failure: Secondary | ICD-10-CM | POA: Diagnosis not present

## 2016-10-21 DIAGNOSIS — T8619 Other complication of kidney transplant: Secondary | ICD-10-CM | POA: Diagnosis present

## 2016-10-21 DIAGNOSIS — E861 Hypovolemia: Secondary | ICD-10-CM | POA: Diagnosis not present

## 2016-10-21 DIAGNOSIS — M791 Myalgia: Secondary | ICD-10-CM | POA: Diagnosis not present

## 2016-10-21 DIAGNOSIS — E7220 Disorder of urea cycle metabolism, unspecified: Secondary | ICD-10-CM | POA: Diagnosis not present

## 2016-10-21 DIAGNOSIS — J9690 Respiratory failure, unspecified, unspecified whether with hypoxia or hypercapnia: Secondary | ICD-10-CM | POA: Diagnosis not present

## 2016-10-21 DIAGNOSIS — R7401 Elevation of levels of liver transaminase levels: Secondary | ICD-10-CM

## 2016-10-21 DIAGNOSIS — Z91048 Other nonmedicinal substance allergy status: Secondary | ICD-10-CM

## 2016-10-21 DIAGNOSIS — M7989 Other specified soft tissue disorders: Secondary | ICD-10-CM | POA: Diagnosis not present

## 2016-10-21 DIAGNOSIS — D696 Thrombocytopenia, unspecified: Secondary | ICD-10-CM | POA: Diagnosis not present

## 2016-10-21 DIAGNOSIS — Z87891 Personal history of nicotine dependence: Secondary | ICD-10-CM

## 2016-10-21 DIAGNOSIS — R6521 Severe sepsis with septic shock: Secondary | ICD-10-CM | POA: Diagnosis not present

## 2016-10-21 DIAGNOSIS — E872 Acidosis, unspecified: Secondary | ICD-10-CM

## 2016-10-21 DIAGNOSIS — R0603 Acute respiratory distress: Secondary | ICD-10-CM | POA: Diagnosis not present

## 2016-10-21 DIAGNOSIS — Z23 Encounter for immunization: Secondary | ICD-10-CM | POA: Diagnosis not present

## 2016-10-21 DIAGNOSIS — Z91041 Radiographic dye allergy status: Secondary | ICD-10-CM

## 2016-10-21 DIAGNOSIS — R066 Hiccough: Secondary | ICD-10-CM | POA: Diagnosis not present

## 2016-10-21 DIAGNOSIS — R748 Abnormal levels of other serum enzymes: Secondary | ICD-10-CM | POA: Diagnosis present

## 2016-10-21 DIAGNOSIS — R0602 Shortness of breath: Secondary | ICD-10-CM

## 2016-10-21 DIAGNOSIS — R29818 Other symptoms and signs involving the nervous system: Secondary | ICD-10-CM | POA: Diagnosis not present

## 2016-10-21 DIAGNOSIS — Z4659 Encounter for fitting and adjustment of other gastrointestinal appliance and device: Secondary | ICD-10-CM

## 2016-10-21 DIAGNOSIS — Z515 Encounter for palliative care: Secondary | ICD-10-CM | POA: Diagnosis not present

## 2016-10-21 DIAGNOSIS — K838 Other specified diseases of biliary tract: Secondary | ICD-10-CM | POA: Diagnosis not present

## 2016-10-21 DIAGNOSIS — J9601 Acute respiratory failure with hypoxia: Secondary | ICD-10-CM

## 2016-10-21 DIAGNOSIS — J9811 Atelectasis: Secondary | ICD-10-CM | POA: Diagnosis not present

## 2016-10-21 DIAGNOSIS — Z7189 Other specified counseling: Secondary | ICD-10-CM

## 2016-10-21 DIAGNOSIS — J9600 Acute respiratory failure, unspecified whether with hypoxia or hypercapnia: Secondary | ICD-10-CM

## 2016-10-21 DIAGNOSIS — E870 Hyperosmolality and hypernatremia: Secondary | ICD-10-CM | POA: Diagnosis present

## 2016-10-21 DIAGNOSIS — I132 Hypertensive heart and chronic kidney disease with heart failure and with stage 5 chronic kidney disease, or end stage renal disease: Secondary | ICD-10-CM | POA: Diagnosis present

## 2016-10-21 DIAGNOSIS — C7931 Secondary malignant neoplasm of brain: Secondary | ICD-10-CM | POA: Diagnosis not present

## 2016-10-21 DIAGNOSIS — S299XXA Unspecified injury of thorax, initial encounter: Secondary | ICD-10-CM | POA: Diagnosis not present

## 2016-10-21 DIAGNOSIS — I5042 Chronic combined systolic (congestive) and diastolic (congestive) heart failure: Secondary | ICD-10-CM | POA: Diagnosis present

## 2016-10-21 DIAGNOSIS — Z4682 Encounter for fitting and adjustment of non-vascular catheter: Secondary | ICD-10-CM | POA: Diagnosis not present

## 2016-10-21 DIAGNOSIS — D638 Anemia in other chronic diseases classified elsewhere: Secondary | ICD-10-CM | POA: Diagnosis present

## 2016-10-21 DIAGNOSIS — I129 Hypertensive chronic kidney disease with stage 1 through stage 4 chronic kidney disease, or unspecified chronic kidney disease: Secondary | ICD-10-CM | POA: Diagnosis not present

## 2016-10-21 DIAGNOSIS — E876 Hypokalemia: Secondary | ICD-10-CM | POA: Diagnosis not present

## 2016-10-21 DIAGNOSIS — J181 Lobar pneumonia, unspecified organism: Secondary | ICD-10-CM | POA: Diagnosis not present

## 2016-10-21 DIAGNOSIS — G8384 Todd's paralysis (postepileptic): Secondary | ICD-10-CM | POA: Diagnosis present

## 2016-10-21 DIAGNOSIS — Z888 Allergy status to other drugs, medicaments and biological substances status: Secondary | ICD-10-CM

## 2016-10-21 DIAGNOSIS — J9620 Acute and chronic respiratory failure, unspecified whether with hypoxia or hypercapnia: Secondary | ICD-10-CM | POA: Diagnosis not present

## 2016-10-21 DIAGNOSIS — K922 Gastrointestinal hemorrhage, unspecified: Secondary | ICD-10-CM | POA: Diagnosis not present

## 2016-10-21 DIAGNOSIS — K7290 Hepatic failure, unspecified without coma: Secondary | ICD-10-CM | POA: Diagnosis not present

## 2016-10-21 DIAGNOSIS — Z825 Family history of asthma and other chronic lower respiratory diseases: Secondary | ICD-10-CM

## 2016-10-21 DIAGNOSIS — Z7901 Long term (current) use of anticoagulants: Secondary | ICD-10-CM | POA: Diagnosis not present

## 2016-10-21 DIAGNOSIS — N179 Acute kidney failure, unspecified: Secondary | ICD-10-CM | POA: Diagnosis not present

## 2016-10-21 DIAGNOSIS — N186 End stage renal disease: Secondary | ICD-10-CM | POA: Diagnosis present

## 2016-10-21 DIAGNOSIS — M25512 Pain in left shoulder: Secondary | ICD-10-CM | POA: Diagnosis not present

## 2016-10-21 DIAGNOSIS — G4089 Other seizures: Secondary | ICD-10-CM | POA: Diagnosis present

## 2016-10-21 DIAGNOSIS — E785 Hyperlipidemia, unspecified: Secondary | ICD-10-CM | POA: Diagnosis not present

## 2016-10-21 DIAGNOSIS — Z86010 Personal history of colonic polyps: Secondary | ICD-10-CM

## 2016-10-21 DIAGNOSIS — Z940 Kidney transplant status: Secondary | ICD-10-CM | POA: Diagnosis not present

## 2016-10-21 DIAGNOSIS — G9340 Encephalopathy, unspecified: Secondary | ICD-10-CM | POA: Diagnosis not present

## 2016-10-21 DIAGNOSIS — R739 Hyperglycemia, unspecified: Secondary | ICD-10-CM | POA: Diagnosis present

## 2016-10-21 DIAGNOSIS — J90 Pleural effusion, not elsewhere classified: Secondary | ICD-10-CM | POA: Diagnosis not present

## 2016-10-21 DIAGNOSIS — R471 Dysarthria and anarthria: Secondary | ICD-10-CM | POA: Diagnosis present

## 2016-10-21 DIAGNOSIS — Z85528 Personal history of other malignant neoplasm of kidney: Secondary | ICD-10-CM | POA: Diagnosis not present

## 2016-10-21 DIAGNOSIS — D899 Disorder involving the immune mechanism, unspecified: Secondary | ICD-10-CM | POA: Diagnosis not present

## 2016-10-21 DIAGNOSIS — C719 Malignant neoplasm of brain, unspecified: Secondary | ICD-10-CM | POA: Diagnosis not present

## 2016-10-21 DIAGNOSIS — R52 Pain, unspecified: Secondary | ICD-10-CM | POA: Diagnosis not present

## 2016-10-21 DIAGNOSIS — R4701 Aphasia: Secondary | ICD-10-CM | POA: Diagnosis present

## 2016-10-21 DIAGNOSIS — J9691 Respiratory failure, unspecified with hypoxia: Secondary | ICD-10-CM | POA: Diagnosis not present

## 2016-10-21 DIAGNOSIS — R109 Unspecified abdominal pain: Secondary | ICD-10-CM | POA: Diagnosis not present

## 2016-10-21 DIAGNOSIS — Z7982 Long term (current) use of aspirin: Secondary | ICD-10-CM

## 2016-10-21 DIAGNOSIS — R4020 Unspecified coma: Secondary | ICD-10-CM | POA: Diagnosis not present

## 2016-10-21 DIAGNOSIS — D62 Acute posthemorrhagic anemia: Secondary | ICD-10-CM | POA: Diagnosis not present

## 2016-10-21 DIAGNOSIS — R9402 Abnormal brain scan: Secondary | ICD-10-CM | POA: Diagnosis not present

## 2016-10-21 DIAGNOSIS — Z789 Other specified health status: Secondary | ICD-10-CM

## 2016-10-21 DIAGNOSIS — N400 Enlarged prostate without lower urinary tract symptoms: Secondary | ICD-10-CM | POA: Diagnosis present

## 2016-10-21 DIAGNOSIS — Z841 Family history of disorders of kidney and ureter: Secondary | ICD-10-CM

## 2016-10-21 DIAGNOSIS — R531 Weakness: Secondary | ICD-10-CM | POA: Diagnosis not present

## 2016-10-21 DIAGNOSIS — I82623 Acute embolism and thrombosis of deep veins of upper extremity, bilateral: Secondary | ICD-10-CM | POA: Diagnosis not present

## 2016-10-21 DIAGNOSIS — E8809 Other disorders of plasma-protein metabolism, not elsewhere classified: Secondary | ICD-10-CM | POA: Diagnosis present

## 2016-10-21 DIAGNOSIS — D6489 Other specified anemias: Secondary | ICD-10-CM | POA: Diagnosis present

## 2016-10-21 DIAGNOSIS — C711 Malignant neoplasm of frontal lobe: Secondary | ICD-10-CM | POA: Diagnosis not present

## 2016-10-21 DIAGNOSIS — G9389 Other specified disorders of brain: Secondary | ICD-10-CM

## 2016-10-21 DIAGNOSIS — Z8249 Family history of ischemic heart disease and other diseases of the circulatory system: Secondary | ICD-10-CM

## 2016-10-21 DIAGNOSIS — G92 Toxic encephalopathy: Secondary | ICD-10-CM | POA: Diagnosis not present

## 2016-10-21 DIAGNOSIS — R7989 Other specified abnormal findings of blood chemistry: Secondary | ICD-10-CM

## 2016-10-21 DIAGNOSIS — D509 Iron deficiency anemia, unspecified: Secondary | ICD-10-CM | POA: Diagnosis not present

## 2016-10-21 DIAGNOSIS — J984 Other disorders of lung: Secondary | ICD-10-CM | POA: Diagnosis not present

## 2016-10-21 DIAGNOSIS — N183 Chronic kidney disease, stage 3 unspecified: Secondary | ICD-10-CM | POA: Diagnosis present

## 2016-10-21 DIAGNOSIS — R140 Abdominal distension (gaseous): Secondary | ICD-10-CM | POA: Diagnosis not present

## 2016-10-21 DIAGNOSIS — Z992 Dependence on renal dialysis: Secondary | ICD-10-CM

## 2016-10-21 DIAGNOSIS — K921 Melena: Secondary | ICD-10-CM | POA: Diagnosis not present

## 2016-10-21 DIAGNOSIS — Z791 Long term (current) use of non-steroidal anti-inflammatories (NSAID): Secondary | ICD-10-CM | POA: Diagnosis not present

## 2016-10-21 DIAGNOSIS — R778 Other specified abnormalities of plasma proteins: Secondary | ICD-10-CM | POA: Diagnosis present

## 2016-10-21 DIAGNOSIS — Z955 Presence of coronary angioplasty implant and graft: Secondary | ICD-10-CM

## 2016-10-21 DIAGNOSIS — N1330 Unspecified hydronephrosis: Secondary | ICD-10-CM | POA: Diagnosis present

## 2016-10-21 DIAGNOSIS — R0682 Tachypnea, not elsewhere classified: Secondary | ICD-10-CM | POA: Diagnosis not present

## 2016-10-21 DIAGNOSIS — D496 Neoplasm of unspecified behavior of brain: Secondary | ICD-10-CM | POA: Diagnosis not present

## 2016-10-21 DIAGNOSIS — D849 Immunodeficiency, unspecified: Secondary | ICD-10-CM | POA: Diagnosis present

## 2016-10-21 DIAGNOSIS — M109 Gout, unspecified: Secondary | ICD-10-CM | POA: Diagnosis present

## 2016-10-21 DIAGNOSIS — Z7952 Long term (current) use of systemic steroids: Secondary | ICD-10-CM

## 2016-10-21 DIAGNOSIS — Z452 Encounter for adjustment and management of vascular access device: Secondary | ICD-10-CM | POA: Diagnosis not present

## 2016-10-21 DIAGNOSIS — Z79899 Other long term (current) drug therapy: Secondary | ICD-10-CM

## 2016-10-21 DIAGNOSIS — R569 Unspecified convulsions: Secondary | ICD-10-CM | POA: Diagnosis not present

## 2016-10-21 DIAGNOSIS — G939 Disorder of brain, unspecified: Secondary | ICD-10-CM | POA: Diagnosis not present

## 2016-10-21 DIAGNOSIS — R4182 Altered mental status, unspecified: Secondary | ICD-10-CM | POA: Diagnosis not present

## 2016-10-21 DIAGNOSIS — R404 Transient alteration of awareness: Secondary | ICD-10-CM | POA: Diagnosis not present

## 2016-10-21 HISTORY — DX: Other symptoms and signs involving the nervous system: R29.818

## 2016-10-21 LAB — DIFFERENTIAL
Basophils Absolute: 0 10*3/uL (ref 0.0–0.1)
Basophils Relative: 0 %
EOS PCT: 0 %
Eosinophils Absolute: 0 10*3/uL (ref 0.0–0.7)
LYMPHS ABS: 1 10*3/uL (ref 0.7–4.0)
LYMPHS PCT: 14 %
MONO ABS: 0.7 10*3/uL (ref 0.1–1.0)
Monocytes Relative: 9 %
NEUTROS ABS: 5.3 10*3/uL (ref 1.7–7.7)
NEUTROS PCT: 77 %

## 2016-10-21 LAB — CBC
HCT: 39.1 % (ref 39.0–52.0)
HEMOGLOBIN: 12.9 g/dL — AB (ref 13.0–17.0)
MCH: 29 pg (ref 26.0–34.0)
MCHC: 33 g/dL (ref 30.0–36.0)
MCV: 87.9 fL (ref 78.0–100.0)
PLATELETS: 158 10*3/uL (ref 150–400)
RBC: 4.45 MIL/uL (ref 4.22–5.81)
RDW: 13.3 % (ref 11.5–15.5)
WBC: 7 10*3/uL (ref 4.0–10.5)

## 2016-10-21 LAB — COMPREHENSIVE METABOLIC PANEL
ALBUMIN: 4 g/dL (ref 3.5–5.0)
ALK PHOS: 67 U/L (ref 38–126)
ALT: 26 U/L (ref 17–63)
ANION GAP: 11 (ref 5–15)
AST: 81 U/L — ABNORMAL HIGH (ref 15–41)
BILIRUBIN TOTAL: 1.1 mg/dL (ref 0.3–1.2)
BUN: 23 mg/dL — ABNORMAL HIGH (ref 6–20)
CALCIUM: 9.7 mg/dL (ref 8.9–10.3)
CO2: 22 mmol/L (ref 22–32)
CREATININE: 2.29 mg/dL — AB (ref 0.61–1.24)
Chloride: 108 mmol/L (ref 101–111)
GFR, EST AFRICAN AMERICAN: 31 mL/min — AB (ref >60)
GFR, EST NON AFRICAN AMERICAN: 27 mL/min — AB (ref >60)
Glucose, Bld: 102 mg/dL — ABNORMAL HIGH (ref 65–99)
Potassium: 3.3 mmol/L — ABNORMAL LOW (ref 3.5–5.1)
SODIUM: 141 mmol/L (ref 135–145)
TOTAL PROTEIN: 7.1 g/dL (ref 6.5–8.1)

## 2016-10-21 LAB — I-STAT CHEM 8, ED
BUN: 27 mg/dL — AB (ref 6–20)
CREATININE: 2.1 mg/dL — AB (ref 0.61–1.24)
Calcium, Ion: 1.15 mmol/L (ref 1.15–1.40)
Chloride: 108 mmol/L (ref 101–111)
GLUCOSE: 100 mg/dL — AB (ref 65–99)
HCT: 41 % (ref 39.0–52.0)
HEMOGLOBIN: 13.9 g/dL (ref 13.0–17.0)
Potassium: 3.4 mmol/L — ABNORMAL LOW (ref 3.5–5.1)
Sodium: 142 mmol/L (ref 135–145)
TCO2: 22 mmol/L (ref 22–32)

## 2016-10-21 LAB — APTT: aPTT: 31 seconds (ref 24–36)

## 2016-10-21 LAB — PROTIME-INR
INR: 1.08
PROTHROMBIN TIME: 13.9 s (ref 11.4–15.2)

## 2016-10-21 LAB — BRAIN NATRIURETIC PEPTIDE: B Natriuretic Peptide: 264.7 pg/mL — ABNORMAL HIGH (ref 0.0–100.0)

## 2016-10-21 LAB — I-STAT TROPONIN, ED: Troponin i, poc: 0.1 ng/mL (ref 0.00–0.08)

## 2016-10-21 LAB — TROPONIN I: TROPONIN I: 0.13 ng/mL — AB (ref <0.03)

## 2016-10-21 LAB — CBG MONITORING, ED: Glucose-Capillary: 111 mg/dL — ABNORMAL HIGH (ref 65–99)

## 2016-10-21 LAB — CK: Total CK: 9711 U/L — ABNORMAL HIGH (ref 49–397)

## 2016-10-21 MED ORDER — SODIUM BICARBONATE 650 MG PO TABS
650.0000 mg | ORAL_TABLET | Freq: Two times a day (BID) | ORAL | Status: DC
  Filled 2016-10-21 (×2): qty 1

## 2016-10-21 MED ORDER — LORAZEPAM 2 MG/ML IJ SOLN
0.5000 mg | Freq: Once | INTRAMUSCULAR | Status: AC | PRN
  Administered 2016-10-21: 1 mg via INTRAVENOUS
  Filled 2016-10-21: qty 1

## 2016-10-21 MED ORDER — TAMSULOSIN HCL 0.4 MG PO CAPS
0.4000 mg | ORAL_CAPSULE | Freq: Every day | ORAL | Status: DC
  Administered 2016-10-24: 0.4 mg via ORAL
  Filled 2016-10-21 (×2): qty 1

## 2016-10-21 MED ORDER — SODIUM CHLORIDE 0.9% FLUSH
3.0000 mL | Freq: Two times a day (BID) | INTRAVENOUS | Status: DC
  Administered 2016-10-21 – 2016-11-10 (×24): 3 mL via INTRAVENOUS

## 2016-10-21 MED ORDER — BISACODYL 10 MG RE SUPP
10.0000 mg | Freq: Every day | RECTAL | Status: DC | PRN
  Administered 2016-10-26: 10 mg via RECTAL
  Filled 2016-10-21: qty 1

## 2016-10-21 MED ORDER — ONDANSETRON HCL 4 MG/2ML IJ SOLN: 4.0000 mg | Freq: Four times a day (QID) | INTRAMUSCULAR | Status: DC | PRN

## 2016-10-21 MED ORDER — MORPHINE SULFATE (PF) 4 MG/ML IV SOLN
1.0000 mg | INTRAVENOUS | Status: DC | PRN
  Administered 2016-10-23 – 2016-10-24 (×3): 2 mg via INTRAVENOUS
  Filled 2016-10-21 (×3): qty 1

## 2016-10-21 MED ORDER — ONDANSETRON HCL 4 MG PO TABS
4.0000 mg | ORAL_TABLET | Freq: Four times a day (QID) | ORAL | Status: DC | PRN
Start: 2016-10-21 — End: 2016-10-30

## 2016-10-21 MED ORDER — PREDNISONE 10 MG PO TABS
5.0000 mg | ORAL_TABLET | Freq: Every day | ORAL | Status: DC
  Administered 2016-10-22: 5 mg via ORAL
  Filled 2016-10-21: qty 1

## 2016-10-21 MED ORDER — SODIUM CHLORIDE 0.9 % IV SOLN
1000.0000 mg | Freq: Two times a day (BID) | INTRAVENOUS | Status: DC
  Administered 2016-10-21 – 2016-10-23 (×4): 1000 mg via INTRAVENOUS
  Filled 2016-10-21 (×4): qty 10

## 2016-10-21 MED ORDER — LABETALOL HCL 5 MG/ML IV SOLN
5.0000 mg | INTRAVENOUS | Status: DC | PRN
  Administered 2016-10-23 – 2016-10-24 (×6): 10 mg via INTRAVENOUS
  Filled 2016-10-21 (×6): qty 4

## 2016-10-21 MED ORDER — TACROLIMUS 1 MG PO CAPS
4.0000 mg | ORAL_CAPSULE | Freq: Two times a day (BID) | ORAL | Status: DC
  Administered 2016-10-21 – 2016-10-22 (×2): 4 mg via ORAL
  Filled 2016-10-21 (×4): qty 4

## 2016-10-21 MED ORDER — POTASSIUM CHLORIDE IN NACL 40-0.9 MEQ/L-% IV SOLN: INTRAVENOUS | Status: DC

## 2016-10-21 MED ORDER — MYCOPHENOLATE SODIUM 180 MG PO TBEC
180.0000 mg | DELAYED_RELEASE_TABLET | Freq: Two times a day (BID) | ORAL | Status: DC
  Administered 2016-10-21 – 2016-10-22 (×2): 180 mg via ORAL
  Filled 2016-10-21 (×4): qty 1

## 2016-10-21 MED ORDER — SENNOSIDES-DOCUSATE SODIUM 8.6-50 MG PO TABS: 1.0000 | ORAL_TABLET | Freq: Every evening | ORAL | Status: DC | PRN

## 2016-10-21 MED ORDER — ACETAMINOPHEN 325 MG PO TABS
650.0000 mg | ORAL_TABLET | Freq: Four times a day (QID) | ORAL | Status: DC | PRN
  Filled 2016-10-21: qty 2

## 2016-10-21 MED ORDER — ACETAMINOPHEN 650 MG RE SUPP
650.0000 mg | Freq: Four times a day (QID) | RECTAL | Status: DC | PRN
  Administered 2016-10-22: 650 mg via RECTAL
  Filled 2016-10-21: qty 1

## 2016-10-21 MED ORDER — SODIUM CHLORIDE 0.9 % IV BOLUS (SEPSIS)
1000.0000 mL | Freq: Once | INTRAVENOUS | Status: AC
  Administered 2016-10-21: 1000 mL via INTRAVENOUS

## 2016-10-21 MED ORDER — SODIUM CHLORIDE 0.9 % IV SOLN
INTRAVENOUS | Status: DC
Start: 2016-10-21 — End: 2016-10-23
  Administered 2016-10-21: 23:00:00 via INTRAVENOUS

## 2016-10-21 MED ORDER — SODIUM CHLORIDE 0.9 % IV SOLN: INTRAVENOUS | Status: DC

## 2016-10-21 MED ORDER — VITAMIN D 1000 UNITS PO TABS
2000.0000 [IU] | ORAL_TABLET | Freq: Every day | ORAL | Status: DC
  Administered 2016-10-24 – 2016-11-12 (×15): 2000 [IU] via ORAL
  Filled 2016-10-21 (×16): qty 2

## 2016-10-21 MED ORDER — PRAVASTATIN SODIUM 20 MG PO TABS
20.0000 mg | ORAL_TABLET | Freq: Every day | ORAL | Status: DC
  Administered 2016-10-24: 20 mg via ORAL
  Filled 2016-10-21 (×2): qty 1

## 2016-10-21 MED ORDER — POTASSIUM CHLORIDE CRYS ER 20 MEQ PO TBCR
20.0000 meq | EXTENDED_RELEASE_TABLET | Freq: Once | ORAL | Status: DC
  Filled 2016-10-21: qty 1

## 2016-10-21 NOTE — Procedures (Signed)
History: 71 year old male presenting with left-sided weakness  Sedation: None  Technique: This is a 21 channel routine scalp EEG performed at the bedside with bipolar and monopolar montages arranged in accordance to the international 10/20 system of electrode placement. One channel was dedicated to EKG recording.    Background: There is a posterior dominant rhythm of 9 Hz which is well formed and bilaterally symmetric. There is intrusion of realized irregular delta activity. There are periodic discharges with delta wave morphology seen at C4, P4, O2 with a period of 2-5 seconds. There is no evolution to these discharges.  Photic stimulation: Physiologic driving is now performed  EEG Abnormalities: 1) lateralized periodic discharges in the right posterior quadrant with delta wave morphology 2) utilize irregular slow activity  Clinical Interpretation: This EEG recorded evidence of orbital dysfunction in the right posterior quadrant, suggestive of irritability. No seizure was recorded.  Roland Rack, MD [facility] [phone]  If 7pm- 7am, please page neurology on call as listed in [place].

## 2016-10-21 NOTE — ED Notes (Signed)
Patient transported to MRI 

## 2016-10-21 NOTE — Progress Notes (Signed)
Bedside EEG completed, results pending. 

## 2016-10-21 NOTE — Consult Note (Signed)
Requesting Physician: ED MD    Chief Complaint: Seizure  History obtained from:  Patient   wife  HPI:                                                                                                                                         Roger Gibson is an 71 y.o. male  arriving to [facility] via [place] at 23.  Patient from home where he went to bed at his baseline last night at 2300.  Patient was found by his wife "hanging off the bed" at 1300 today. She tried to get him up and he slid to the floor.  Patient with h/o hypertension, renal failure and kidney transplant.  Patient assessed to have left sided weakness and left gaze per EMS.  Patient with dried blood on tongue on arrival.  Patient reporting not being able to use the BR.   NIHSS 12, see documentation for details and code stroke times.  Patient with left sided weakness, dysarthria and questionable neglect on exam.  VAN+.  Patient is outside the window for treatment with tPA.  MRI obtained and showed no acute stoke. Differential included Sz and stat EEG will be obtained.   Date last known well: Date: 10/20/2016 Time last known well: Time: 23:00 tPA Given: No: out of window   Past Medical History:  Diagnosis Date  . Allergy   . Blood transfusion without reported diagnosis   . Cancer ([place])    right kidney ca  . Confusion    during admission to [facility] 2012, persisted after discharge  . ESRD (end stage renal disease) ([place])   . ESRD (end stage renal disease) on dialysis ([place])    T, Th, Sat HD in [place], [place]  . Gout 01/2002   Right knee; Left great toe  . HLD (hyperlipidemia) 1999  . HTN (hypertension) 1974  . Iron deficiency anemia    PRE 2004/06/2000  . Renal cysts, acquired, bilateral    per Darol Destine    Past Surgical History:  Procedure Laterality Date  . [place]  05/15/03   Polyp (biopsy negative) repeat in 5 years  . COLONOSCOPY   07/18/08   1 rectal polyp (Dr. Wynetta Emery) 5 years  . CT ABD W & PELVIS WO CM  03/03/06   Right kidney absent; Left with cystic changes  . CYSTOSCOPY  03/03/06   Mild to mod. BPH observed/ otherwise normal  . KIDNEY TRANSPLANT  Jul 06, 2006   deceased donor-RLQ Abd, partial neph for renal cell CA/complete Nephrectomy for infection ([facility])  . MR MRA ABDOMEN  2000   3x3 mass left kidney  . NEPHRECTOMY  1965   Left (infection) [place]  . NEPHRECTOMY  2000   partial-(2nd to renal carcinoma) Dr. [place] Crane [facility]    Family History  Problem Relation Age of Onset  . Stomach cancer Father        ?  Marland Kitchen Hypertension Mother        On dialysis for years  . Hypertension Brother   . Hypertension Brother   . Hypertension Brother   . Hypertension Sister   . Breast cancer Sister        s/p mastectomy  . COPD Sister   . Hypertension Sister   . Kidney failure Sister        on dialysis  . Heart attack Paternal Grandfather   . Colon cancer Neg Hx   . Esophageal cancer Neg Hx   . Rectal cancer Neg Hx    Social History:  reports that he quit smoking about 18 years ago. His smoking use included Cigarettes and Cigars. He has never used smokeless tobacco. He reports that he does not drink alcohol or use drugs.  Allergies:  Allergies  Allergen Reactions  . Iodinated Diagnostic Agents Hives and Swelling  . Iodine Hives and Swelling  . Tape Rash    Adhesive tape     Medications:                                                                                                                           No current facility-administered medications for this encounter.    Current Outpatient Prescriptions  Medication Sig Dispense Refill  . aspirin EC 81 MG tablet Take 81 mg by mouth daily.    . Cholecalciferol 2000 units TABS Take 2,000 Units by mouth daily.    . furosemide (LASIX) 20 MG tablet Take 10 mg by mouth daily.     Marland Kitchen labetalol (NORMODYNE) 100 MG tablet Take 400 mg by mouth 2 (two) times  daily.     . Multiple Vitamin (MULTIVITAMIN WITH MINERALS) TABS tablet Take 1 tablet by mouth daily.    . mycophenolate (MYFORTIC) 180 MG EC tablet Take 180 mg by mouth 2 (two) times daily.     . pravastatin (PRAVACHOL) 20 MG tablet Take 20 mg by mouth every evening.    . predniSONE (DELTASONE) 5 MG tablet Take 5 mg by mouth daily with breakfast.     . sodium bicarbonate 650 MG tablet Take 650 mg by mouth 2 (two) times daily.    . tacrolimus (PROGRAF) 1 MG capsule Take 4 mg by mouth 2 (two) times daily.     . tamsulosin (FLOMAX) 0.4 MG CAPS capsule Take 0.4 mg by mouth daily after supper.    Marland Kitchen omeprazole (PRILOSEC) 20 MG capsule Take 20 mg by mouth daily.    . sertraline (ZOLOFT) 50 MG tablet Take 50 mg by mouth daily.    Marland Kitchen sulfamethoxazole-trimethoprim (BACTRIM DS,SEPTRA DS) 800-160 MG per tablet Take 1 tablet by mouth every Monday, Wednesday, and Friday.    . valGANciclovir (VALCYTE) 450 MG tablet Take 900 mg by mouth daily with supper.      ROS:  History obtained from the patient  General ROS: negative for - chills, fatigue, fever, night sweats, weight gain or weight loss Psychological ROS: negative for - behavioral disorder, hallucinations, memory difficulties, mood swings or suicidal ideation Ophthalmic ROS: negative for - blurry vision, double vision, eye pain or loss of vision ENT ROS: negative for - epistaxis, nasal discharge, oral lesions, sore throat, tinnitus or vertigo Allergy and Immunology ROS: negative for - hives or itchy/watery eyes Hematological and Lymphatic ROS: negative for - bleeding problems, bruising or swollen lymph nodes Endocrine ROS: negative for - galactorrhea, hair pattern changes, polydipsia/polyuria or temperature intolerance Respiratory ROS: negative for - cough, hemoptysis, shortness of breath or wheezing Cardiovascular ROS:  negative for - chest pain, dyspnea on exertion, edema or irregular heartbeat Gastrointestinal ROS: negative for - abdominal pain, diarrhea, hematemesis, nausea/vomiting or stool incontinence Genito-Urinary ROS: negative for - dysuria, hematuria, incontinence or urinary frequency/urgency Musculoskeletal ROS: negative for - joint swelling or muscular weakness Neurological ROS: as noted in HPI Dermatological ROS: negative for rash and skin lesion changes  Neurologic Examination:                                                                                                      Blood pressure (!) 146/86.  HEENT-  Normocephalic, no lesions, without obvious abnormality.  Normal external eye and conjunctiva.  Normal TM's bilaterally.  Normal auditory canals and external ears. Normal external nose, mucus membranes and septum.  Normal pharynx. Cardiovascular- S1, S2 normal, pulses palpable throughout   Lungs- chest clear, no wheezing, rales, normal symmetric air entry Abdomen- normal findings: bowel sounds normal Extremities- no edema Lymph-no adenopathy palpable Musculoskeletal-no joint tenderness, deformity or swelling Skin-warm and dry, no hyperpigmentation, vitiligo, or suspicious lesions  Neurological Examination Mental Status: Alert, oriented to [place] and year.  Speech dysarthric due to bitten tongue without evidence of aphasia.  Able to follow 3 step commands without difficulty. Cranial Nerves: II:  Visual fields grossly normal,  III,IV, VI: ptosis not present, extra-ocular motions intact bilaterally, pupils equal, round, reactive to light and accommodation V,VII: smile symmetric, facial light touch sensation normal bilaterally VIII: hearing normal bilaterally IX,X: uvula rises symmetrically XI: bilateral shoulder shrug XII: midline tongue extension Motor: Right : Upper extremity   5/5    Left:     Upper extremity   3/5  Lower extremity   5/5     Lower extremity   0/5 Tone and  bulk:normal tone throughout; no atrophy noted Sensory: neglects left leg Deep Tendon Reflexes: 2+ and symmetric throughout UE with minimal to no KJ and no AJ Plantars: Right: downgoing   Left: downgoing Cerebellar: normal finger-to-nose on the right not able to do on left,  and normal heel-to-shin test on right not able to do on left Gait: not tested       Lab Results: Basic Metabolic Panel:  Recent Labs Lab 10/12/2016 1345 10/15/2016 1402  NA 141 142  K 3.3* 3.4*  CL 108 108  CO2 22  --   GLUCOSE 102* 100*  BUN 23* 27*  CREATININE  2.29* 2.10*  CALCIUM 9.7  --     Liver Function Tests:  Recent Labs Lab 10/22/2016 1345  AST 81*  ALT 26  ALKPHOS 67  BILITOT 1.1  PROT 7.1  ALBUMIN 4.0   No results for input(s): LIPASE, AMYLASE in the last 168 hours. No results for input(s): AMMONIA in the last 168 hours.  CBC:  Recent Labs Lab 09/30/2016 1345 09/28/2016 1402  WBC 7.0  --   NEUTROABS 5.3  --   HGB 12.9* 13.9  HCT 39.1 41.0  MCV 87.9  --   PLT 158  --     Cardiac Enzymes: No results for input(s): CKTOTAL, CKMB, CKMBINDEX, TROPONINI in the last 168 hours.  Lipid Panel: No results for input(s): CHOL, TRIG, HDL, CHOLHDL, VLDL, LDLCALC in the last 168 hours.  CBG: No results for input(s): GLUCAP in the last 168 hours.  Microbiology: Results for orders placed or performed during the hospital encounter of 05/14/14  Blood culture (routine x 2)     Status: None   Collection Time: 05/14/14  5:47 PM  Result Value Ref Range Status   Specimen Description BLOOD LEFT WRIST  Final   Special Requests BOTTLES DRAWN AEROBIC ONLY 5ML  Final   Culture   Final    NO GROWTH 5 DAYS Performed at [facility]    Report Status 05/21/2014 FINAL  Final  Blood culture (routine x 2)     Status: None   Collection Time: 05/14/14  5:52 PM  Result Value Ref Range Status   Specimen Description BLOOD LEFT FOREARM  Final   Special Requests BOTTLES DRAWN AEROBIC AND ANAEROBIC  10ML  Final   Culture   Final    NO GROWTH 5 DAYS Performed at [facility]    Report Status 05/21/2014 FINAL  Final  Urine culture     Status: None   Collection Time: 05/14/14  8:10 PM  Result Value Ref Range Status   Specimen Description URINE, RANDOM  Final   Special Requests Immunocompromised  Final   Colony Count   Final    15,000 COLONIES/ML Performed at [facility]    Culture   Final    Multiple bacterial morphotypes present, none predominant. Suggest appropriate recollection if clinically indicated. Performed at [facility]    Report Status 05/16/2014 FINAL  Final    Coagulation Studies:  Recent Labs  10/11/2016 1345  LABPROT 13.9  INR 1.08    Imaging: Ct Head Code Stroke Wo Contrast  Result Date: 10/20/2016 CLINICAL DATA:  Code stroke. Altered level of consciousness. History of hypertension, hyperlipidemia, end-stage renal disease on dialysis, RIGHT renal cancer. EXAM: CT HEAD WITHOUT CONTRAST TECHNIQUE: Contiguous axial images were obtained from the base of the skull through the vertex without intravenous contrast. COMPARISON:  None. FINDINGS: BRAIN: Focal blurring of the gray-white matter differentiation RIGHT frontal convexity (axial 29/36) with faint central density. Moderate parenchymal brain volume loss without hydrocephalus. No midline shift or mass effect. No intraparenchymal hemorrhage. Patchy additional white matter hypodensities. VASCULAR: Mild calcific atherosclerosis of the carotid siphons. SKULL: No skull fracture. Mild thinning RIGHT frontal calvarium. No destructive bony lesions. 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 No significant scalp soft tissue swelling. SINUSES/ORBITS: Mild paranasal sinus mucosal thickening, chronic maxillary sinusitis without air-fluid levels. Mastoid air cells are well aerated. The included ocular globes and orbital contents  are non-suspicious. OTHER: Multiple absent teeth. ASPECTS [facility] Stroke Program Early CT Score) -  Ganglionic level infarction (caudate, lentiform nuclei, internal capsule, insula, M1-M3 cortex): 7 - Supraganglionic infarction (M4-M6 cortex): 2 Total score (0-10 with 10 being normal): 9 IMPRESSION: 1. Small RIGHT frontal lobe infarct versus venous hypertension and cortical vein thrombosis versus metastasis. Recommend MRI of the head. Contrast enhanced imaging could be deferred at this time given patient's history of end-stage renal disease on dialysis. 2. ASPECTS is 9. 3. Moderate atrophy and mild chronic small vessel ischemic disease. 4. Critical Value/emergent results were called by telephone at the time of interpretation on 10/14/2016 at 2:14 pm to Dr. Leonel Ramsay, Neurology, who verbally acknowledged these results. Electronically Signed   By: Elon Alas M.D.   On: 09/25/2016 14:17       Assessment and plan discussed with with attending physician and they are in agreement.    Etta Quill PA-C [facility] [phone]  10/01/2016, 3:32 PM   Assessment: 71 y.o. male with a history of kidney cancer who presents with left-sided weakness in the setting of tongue biting and bleeding and altered mental status. I suspect that he had a seizure with subsequent prolonged Todd's paralysis. There is no clear evidence of infarct on MRI, and I suspect that the imaging finding there represents likely metastasis.  Stroke Risk Factors - hyperlipidemia and hypertension  1) Keppra 1 g twice a day 2) repeat MRI with/without contrast once able 3) neurology will continue to follow  Roland Rack, MD [facility] [phone]  If 7pm- 7am, please page neurology on call as listed in [place].

## 2016-10-21 NOTE — H&P (Signed)
History and Physical    Roger Gibson:[id number] DOB: March 09, 1945 DOA: 10/19/2016  PCP: [name], MD   Patient coming from: Home  Chief Complaint: Left-side weakness   HPI: Roger Gibson is a 71 y.o. male with medical history significant for hypertension, chronic combined systolic/diastolic CHF, history of right kidney cancer, end-stage renal disease status post renal transplantation, that presented to the emergency department with left-sided weakness. History is obtained from the patient's wife at the bedside. He had reportedly been in his usual state last night, driving them to dinner and back home. He went to bed as usual around 11 PM, was noted to sleep in which is not unusual for him, but then called out to his wife at approximately 1 PM, reporting difficulty getting out of bed. She had noted earlier that he was sleeping with his left leg hanging off the bed, and she was not strong enough to help stand him up. A neighbor came to help get the patient up, at which point he was noted to have dried blood caked about his mouth, and EMS was called. Patient complained of some abdominal discomfort with EMS, but had not been expressing any recent complaints aside from some minor knee pain.  ED Course: Upon arrival to the ED, patient is found to be afebrile, saturating well on room air, slightly tachypneic, and with vitals otherwise stable. EKG features a sinus rhythm with left axis deviation and anterolateral repolarization abnormality. Chest x-ray features low volumes with mild cardiomegaly, but no infiltrate or edema. Noncontrast head CT reveals a small right frontal lobe abnormality of uncertain etiology. This was followed by MRI brain which was aborted early due to patient intolerance, but notable for abnormal signal at the right superior frontal gyrus consistent with vasogenic edema. Chemistry panel reveals a potassium of 3.3, and creatinine of 2.29, up from 1.5 last month. CBC is  unremarkable. Troponin is elevated to 0.10. Neurology was consulted and patient has undergone EEG with interpretation pending. He was treated with a liter of normal saline in the ED. He will be admitted to the telemetry unit for ongoing evaluation and management of acute neurologic deficits with vasogenic edema noted on a limited MRI.  Review of Systems:  All other systems reviewed and apart from HPI, are negative.  Past Medical History:  Diagnosis Date  . Allergy   . Blood transfusion without reported diagnosis   . Cancer ([place])    right kidney ca  . Confusion    during admission to [facility] 2012, persisted after discharge  . ESRD (end stage renal disease) ([place])   . ESRD (end stage renal disease) on dialysis ([place])    T, Th, Sat HD in [place], [place]  . Gout 01/2002   Right knee; Left great toe  . HLD (hyperlipidemia) 1999  . HTN (hypertension) 1974  . Iron deficiency anemia    PRE 2004/06/2000  . Renal cysts, acquired, bilateral    per Darol Destine    Past Surgical History:  Procedure Laterality Date  . [place]  05/15/03   Polyp (biopsy negative) repeat in 5 years  . COLONOSCOPY  07/18/08   1 rectal polyp (Dr. Wynetta Emery) 5 years  . CT ABD W & PELVIS WO CM  03/03/06   Right kidney absent; Left with cystic changes  . CYSTOSCOPY  03/03/06   Mild to mod. BPH observed/ otherwise normal  . KIDNEY TRANSPLANT  06/20/06   deceased donor-RLQ Abd, partial neph for renal cell CA/complete Nephrectomy for infection ([facility])  . MR MRA ABDOMEN  2000   3x3 mass left kidney  . NEPHRECTOMY  1965   Left (infection) [place]  . NEPHRECTOMY  2000   partial-(2nd to renal carcinoma) Dr. [place] Crane [facility]     reports that he quit smoking about 18 years ago. His smoking use included Cigarettes and Cigars. He has never used smokeless tobacco. He reports that he does not drink alcohol or use drugs.  Allergies    Allergen Reactions  . Iodinated Diagnostic Agents Hives and Swelling  . Iodine Hives and Swelling  . Tape Rash    Adhesive tape     Family History  Problem Relation Age of Onset  . Stomach cancer Father        ?  Marland Kitchen Hypertension Mother        On dialysis for years  . Hypertension Brother   . Hypertension Brother   . Hypertension Brother   . Hypertension Sister   . Breast cancer Sister        s/p mastectomy  . COPD Sister   . Hypertension Sister   . Kidney failure Sister        on dialysis  . Heart attack Paternal Grandfather   . Colon cancer Neg Hx   . Esophageal cancer Neg Hx   . Rectal cancer Neg Hx      Prior to Admission medications   Medication Sig Start Date End Date Taking? Authorizing Provider  aspirin EC 81 MG tablet Take 81 mg by mouth daily.   Yes [provider]  Cholecalciferol 2000 units TABS Take 2,000 Units by mouth daily.   Yes [provider]  furosemide (LASIX) 20 MG tablet Take 10 mg by mouth daily.    Yes [provider]  labetalol (NORMODYNE) 100 MG tablet Take 400 mg by mouth 2 (two) times daily.    Yes [provider]  Multiple Vitamin (MULTIVITAMIN WITH MINERALS) TABS tablet Take 1 tablet by mouth daily.   Yes [provider]  mycophenolate (MYFORTIC) 180 MG EC tablet Take 180 mg by mouth 2 (two) times daily.    Yes [provider]  pravastatin (PRAVACHOL) 20 MG tablet Take 20 mg by mouth every evening.   Yes [provider]  predniSONE (DELTASONE) 5 MG tablet Take 5 mg by mouth daily with breakfast.    Yes [provider]  sodium bicarbonate 650 MG tablet Take 650 mg by mouth 2 (two) times daily.   Yes [provider]  tacrolimus (PROGRAF) 1 MG capsule Take 4 mg by mouth 2 (two) times daily.    Yes [provider]  tamsulosin (FLOMAX) 0.4 MG CAPS capsule Take 0.4 mg by mouth daily after supper.   Yes [provider]  omeprazole (PRILOSEC) 20 MG  capsule Take 20 mg by mouth daily.    [provider]  sertraline (ZOLOFT) 50 MG tablet Take 50 mg by mouth daily.    [provider]    Physical Exam: Vitals:   10/10/2016 1527 10/15/2016 1700 10/12/2016 1712  BP: (!) 146/86 (!) 165/88   Pulse:  94   Resp:  (!) 33   Temp:   98.4 F (36.9 C)  SpO2:  98%       Constitutional: NAD, calm, appears uncomfortable Eyes: PERTLA, lids and conjunctivae normal ENMT: Mucous membranes are moist. Posterior pharynx clear of any exudate  or lesions.   Neck: normal, supple, no masses, no thyromegaly Respiratory: clear to auscultation bilaterally, no wheezing, slight tachypnea. No accessory muscle use.  Cardiovascular: S1 & S2 heard, regular rate and rhythm. No significant JVD. Abdomen: Mild distension, mild generalized tenderness. No rebound pain or guarding. Bowel sounds active.  Musculoskeletal: no clubbing / cyanosis. No joint deformity upper and lower extremities.   Skin: no significant rashes, lesions, ulcers. Warm, dry, well-perfused. Neurologic: Slight dysarthria, CN II-XII grossly intact. Strength 3-4/5 in LUE, 1/5 in LLE. 5/5 in RUE and RLE.   Psychiatric: Alert and oriented to person, place, situation. Anxious, cooperative.    Labs on Admission: I have personally reviewed following labs and imaging studies  CBC:  Recent Labs Lab 09/27/2016 1345 10/17/2016 1402  WBC 7.0  --   NEUTROABS 5.3  --   HGB 12.9* 13.9  HCT 39.1 41.0  MCV 87.9  --   PLT 158  --    Basic Metabolic Panel:  Recent Labs Lab 10/04/2016 1345 10/14/2016 1402  NA 141 142  K 3.3* 3.4*  CL 108 108  CO2 22  --   GLUCOSE 102* 100*  BUN 23* 27*  CREATININE 2.29* 2.10*  CALCIUM 9.7  --    GFR: CrCl cannot be calculated (Unknown ideal weight.). Liver Function Tests:  Recent Labs Lab 09/27/2016 1345  AST 81*  ALT 26  ALKPHOS 67  BILITOT 1.1  PROT 7.1  ALBUMIN 4.0   No results for input(s): LIPASE, AMYLASE in the last 168 hours. No results  for input(s): AMMONIA in the last 168 hours. Coagulation Profile:  Recent Labs Lab 10/04/2016 1345  INR 1.08   Cardiac Enzymes: No results for input(s): CKTOTAL, CKMB, CKMBINDEX, TROPONINI in the last 168 hours. BNP (last 3 results) No results for input(s): PROBNP in the last 8760 hours. HbA1C: No results for input(s): HGBA1C in the last 72 hours. CBG:  Recent Labs Lab 10/12/2016 1620  GLUCAP 111*   Lipid Profile: No results for input(s): CHOL, HDL, LDLCALC, TRIG, CHOLHDL, LDLDIRECT in the last 72 hours. Thyroid Function Tests: No results for input(s): TSH, T4TOTAL, FREET4, T3FREE, THYROIDAB in the last 72 hours. Anemia Panel: No results for input(s): VITAMINB12, FOLATE, FERRITIN, TIBC, IRON, RETICCTPCT in the last 72 hours. Urine analysis:    Component Value Date/Time   COLORURINE YELLOW 05/14/2014 2010   APPEARANCEUR CLEAR 05/14/2014 2010   LABSPEC 1.010 05/14/2014 2010   PHURINE 5.5 05/14/2014 2010   GLUCOSEU NEGATIVE 05/14/2014 2010   HGBUR NEGATIVE 05/14/2014 2010   HGBUR negative 04/01/2008 0933   BILIRUBINUR NEGATIVE 05/14/2014 2010   [place] NEGATIVE 05/14/2014 2010   PROTEINUR NEGATIVE 05/14/2014 2010   UROBILINOGEN 0.2 05/14/2014 2010   NITRITE NEGATIVE 05/14/2014 2010   LEUKOCYTESUR NEGATIVE 05/14/2014 2010   Sepsis Labs: @LABRCNTIP (procalcitonin:4,lacticidven:4) )No results found for this or any previous visit (from the past 240 hour(s)).   Radiological Exams on Admission: Mr Brain Wo Contrast  Result Date: 10/16/2016 CLINICAL DATA:  71 year old male code stroke. Found with abnormal mental status. Small right frontal lobe infarcts suspected on noncontrast head CT today. Outside of treatment window for IV tPA. EXAM: MRI HEAD WITHOUT CONTRAST TECHNIQUE: Multiplanar, multiecho pulse sequences of the brain and surrounding structures were obtained without intravenous contrast. COMPARISON:  Head CT without contrast 1409 hours today. FINDINGS: The examination had  to be discontinued prior to completion due to altered mental status. Axial in coronal diffusion weighted imaging plus axial FLAIR imaging only was obtained, and is degraded  by motion. Brain: There is abnormal mass like trace diffusion and FLAIR hyperintensity at the right vertex corresponding to the CT abnormality seen today, but this is facilitated on diffusion (series 300, image 44) such as due to vasogenic (non cytotoxic) edema. No significant regional mass effect. No definite abnormal diffusion elsewhere. Stable cerebral morphology compared to the earlier noncontrast head CT. IMPRESSION: 1.  The examination had to be discontinued prior to completion. 2. Abnormal signal at the right superior frontal gyrus corresponding to the CT finding, but compatible with vasogenic edema rather than acute or subacute infarct. Repeat Brain MRI without and with contrast recommended once the patient can tolerate. Electronically Signed   By: Genevie Ann M.D.   On: 10/19/2016 15:33   Dg Chest Portable 1 View  Result Date: 09/30/2016 CLINICAL DATA:  Shortness of breath EXAM: PORTABLE CHEST 1 VIEW COMPARISON:  05/14/2014 FINDINGS: Vascular stent over the upper mediastinum. Low lung volumes. Mild cardiomegaly. No infiltrate or effusion. Aortic atherosclerosis. IMPRESSION: Low lung volumes with mild cardiomegaly.  No infiltrate or edema. Electronically Signed   By: Donavan Foil M.D.   On: 10/15/2016 16:35   Ct Head Code Stroke Wo Contrast  Result Date: 10/19/2016 CLINICAL DATA:  Code stroke. Altered level of consciousness. History of hypertension, hyperlipidemia, end-stage renal disease on dialysis, RIGHT renal cancer. EXAM: CT HEAD WITHOUT CONTRAST TECHNIQUE: Contiguous axial images were obtained from the base of the skull through the vertex without intravenous contrast. COMPARISON:  None. FINDINGS: BRAIN: Focal blurring of the gray-white matter differentiation RIGHT frontal convexity (axial 29/36) with faint central density.  Moderate parenchymal brain volume loss without hydrocephalus. No midline shift or mass effect. No intraparenchymal hemorrhage. Patchy additional white matter hypodensities. VASCULAR: Mild calcific atherosclerosis of the carotid siphons. SKULL: No skull fracture. Mild thinning RIGHT frontal calvarium. No destructive bony lesions. 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 No significant scalp soft tissue swelling. SINUSES/ORBITS: Mild paranasal sinus mucosal thickening, chronic maxillary sinusitis without air-fluid levels. Mastoid air cells are well aerated. The included ocular globes and orbital contents are non-suspicious. OTHER: Multiple absent teeth. ASPECTS [facility] Stroke Program Early CT Score) - Ganglionic level infarction (caudate, lentiform nuclei, internal capsule, insula, M1-M3 cortex): 7 - Supraganglionic infarction (M4-M6 cortex): 2 Total score (0-10 with 10 being normal): 9 IMPRESSION: 1. Small RIGHT frontal lobe infarct versus venous hypertension and cortical vein thrombosis versus metastasis. Recommend MRI of the head. Contrast enhanced imaging could be deferred at this time given patient's history of end-stage renal disease on dialysis. 2. ASPECTS is 9. 3. Moderate atrophy and mild chronic small vessel ischemic disease. 4. Critical Value/emergent results were called by telephone at the time of interpretation on 10/12/2016 at 2:14 pm to Dr. Leonel Ramsay, Neurology, who verbally acknowledged these results. Electronically Signed   By: Elon Alas M.D.   On: 10/12/2016 14:17    EKG: Independently reviewed. Sinus rhythm, LAD, anterolateral repolarization abnormality.   Assessment/Plan  1. Acute neurologic event; vasogenic brain edema  - Pt presents with left-sided weakness after he was last seen normal at 23:00 the night before  - Dried blood about the mouth raised suspicion for seizure and EEG was performed,  read pending  - CT with right frontal abnormality; MRI limited as was aborted early d/t pt intolerance, but reveals right frontal gyrus vasogenic edema  - Neurology is consulting and much appreciate, will follow-up on recommendations  - Plan to sedate pt for repeat MRI/MRA attempt, continue seizure precautions, continue  frequent neuro checks, supportive care   2. ESRD s/p renal transplantation - SCr is 2.29 on admission, up from 1.5 in August 2018  - He has hx of right renal cancer and ESRD, now with transplant kidney  - Continue low-dose prednisone, mycophenolate, and tacrolimus   3. Acute kidney superimposed on CKD stage III  - SCr is 2.29 on admission, up from 1.5 in August 2018  - He was given a liter of NS in ED, will be continued on IVF hydration overnight  - Continue immunosuppressants, repeat chem panel in am   4. Hypokalemia  - Serum potassium 3.3 on admission  - Replace conservatively in light of kidney disease, treat with 20 mEq oral supplementation, repeat chem panel in am   5. Chronic combined systolic/diastolic CHF  - Pt appeared hypovolemic on arrival and was treated with 1 L NS in ED  - Hold Lasix initially, follow strict I/O's and daily wts, resume beta-blocker as tolerated    6. Hypertension  - BP at goal in ED  - Managed at home with labetalol - Will use prn labetalol IVP's until passes swallow screen    7. Elevated troponin - Troponin elevated to 0.10 on admission  - No anginal complaints to suggest ACS  - Plan to continue cardiac monitoring and trend troponin     DVT prophylaxis: SCD's Code Status: Full  Family Communication: Wife updated at bedside Disposition Plan: Admit to telemetry Consults called: Neurology Admission status: Inpatient    Vianne Bulls, MD [facility] Pager [phone]  If 7PM-7AM, please contact night-coverage [email or web address] Password [facility]  10/09/2016, 6:41 PM

## 2016-10-21 NOTE — Code Documentation (Signed)
71yo male arriving to [facility] via [place] at 62.  Patient from home where he went to bed at his baseline last night at 2300.  Patient was found by his wife "hanging off the bed" at 1300 today. She tried to get him up and he slid to the floor.  Patient with h/o hypertension, renal failure and kidney transplant.  Patient assessed to have left sided weakness and left gaze per EMS.  Patient with dried blood on tongue on arrival.  Patient reporting not being able to use the BR.  Stroke team at the bedside on patient arrival.  Labs drawn and patient to CT with team.  CT completed.  Patient is allergic to contrast, therefore, MRI/A ordered.  MRI called, back to B14 while waiting for MRI table.  IV access obtained by ED RN via ultrasound.  NIHSS 12, see documentation for details and code stroke times.  Patient with left sided weakness, dysarthria and questionable neglect on exam.  VAN+.  Patient is outside the window for treatment with tPA.  Patient to MRI at 1428.  No acute stroke treatment at this time.  Bedside handoff with ED RN.

## 2016-10-21 NOTE — ED Provider Notes (Signed)
[facility] DEPT Provider Note   CSN: [id number] Arrival date & time: 10/24/2016  1355   An emergency department physician performed an initial assessment on this suspected stroke patient at [facility]).  History   Chief Complaint Chief Complaint  Patient presents with  . Code Stroke    HPI Roger Gibson is a 71 y.o. male.  HPI   He is a 71 year old male presenting with seizure-like activity. Patient has been feeling very fatigued lately per wife and today he called out around 3 PM . [name] went to go see him and found him to have crusted blood on his lips, black tongue, altered. Agents neighbor came over. EMS was called and patient was brought here to the emergency department. Patient was called code stroke. CT showed questionable stroke, emergently went to MRI, and then came back to the room with the EEG.   Past Medical History:  Diagnosis Date  . Allergy   . Blood transfusion without reported diagnosis   . Cancer ([place])    right kidney ca  . Confusion    during admission to [facility] 2012, persisted after discharge  . ESRD (end stage renal disease) ([place])   . ESRD (end stage renal disease) on dialysis ([place])    T, Th, Sat HD in [place], [place]  . Gout 01/2002   Right knee; Left great toe  . HLD (hyperlipidemia) 1999  . HTN (hypertension) 1974  . Iron deficiency anemia    PRE 2004/06/2000  . Renal cysts, acquired, bilateral    per [place] [facility]    Patient Active Problem List   Diagnosis Date Noted  . CHF exacerbation ([place]) 05/14/2014  . Renal transplant recipient 05/14/2014  . Renal failure, acute ([place]) 05/14/2014  . History of renal transplant   . Immunosuppressed status ([place])   . Hyperkalemia 08/01/2012  . Pulmonary edema 08/01/2012  . Leg pain 08/16/2010  . MRSA (methicillin resistant staph aureus) culture positive 07/20/2010  . Renal cell carcinoma 06/13/2010  . Confusion 05/19/2010  . ESRD on hemodialysis ([place])  05/19/2010  . 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 ALLERGY, ENVIRONMENTAL 06/18/2009  . MALAISE AND FATIGUE 04/01/2008  . HYPERLIPIDEMIA 12/26/2006  . GOUT 12/26/2006  . ANEMIA-IRON DEFICIENCY 12/26/2006  . Essential hypertension 12/26/2006  . HEMATURIA, HX OF 12/23/2006  . BENIGN PROSTATIC HYPERTROPHY, HX OF 12/23/2006  . PERS HX TOBACCO USE PRESENTING HAZARDS HEALTH 12/23/2006    Past Surgical History:  Procedure Laterality Date  . [place]  05/15/03   Polyp (biopsy negative) repeat in 5 years  . COLONOSCOPY  07/18/08   1 rectal polyp (Dr. Wynetta Emery) 5 years  . CT ABD W & PELVIS WO CM  03/03/06   Right kidney absent; Left with cystic changes  . CYSTOSCOPY  03/03/06   Mild to mod. BPH observed/ otherwise normal  . KIDNEY TRANSPLANT  June 29, 2006   deceased donor-RLQ Abd, partial neph for renal cell CA/complete Nephrectomy for infection ([facility])  . MR MRA ABDOMEN  2000   3x3 mass left kidney  . NEPHRECTOMY  1965   [place]  . NEPHRECTOMY  2000   partial-(2nd to renal carcinoma) Dr. [place] Crane [facility] Medications    Prior to Admission medications   Medication Sig Start Date End Date Taking? Authorizing Provider  aspirin EC 81 MG tablet Take 81 mg by mouth daily.   Yes  [provider]  Cholecalciferol 2000 units TABS Take 2,000 Units by mouth daily.   Yes [provider]  furosemide (LASIX) 20 MG tablet Take 10 mg by mouth daily.    Yes [provider]  labetalol (NORMODYNE) 100 MG tablet Take 400 mg by mouth 2 (two) times daily.    Yes [provider]  Multiple Vitamin (MULTIVITAMIN WITH MINERALS) TABS tablet Take 1 tablet by mouth daily.   Yes [provider]  mycophenolate (MYFORTIC) 180 MG EC tablet Take 180 mg by mouth 2 (two) times daily.    Yes [provider]  pravastatin (PRAVACHOL) 20 MG tablet Take 20 mg by mouth every evening.   Yes [provider]  predniSONE (DELTASONE) 5 MG tablet Take 5 mg by mouth daily with breakfast.    Yes [provider]  sodium bicarbonate 650 MG tablet Take 650 mg by mouth 2 (two) times daily.   Yes [provider]  tacrolimus (PROGRAF) 1 MG capsule Take 4 mg by mouth 2 (two) times daily.    Yes [provider]  tamsulosin (FLOMAX) 0.4 MG CAPS capsule Take 0.4 mg by mouth daily after supper.   Yes [provider]  omeprazole (PRILOSEC) 20 MG capsule Take 20 mg by mouth daily.    [provider]  sertraline (ZOLOFT) 50 MG tablet Take 50 mg by mouth daily.    [provider]  sulfamethoxazole-trimethoprim (BACTRIM DS,SEPTRA DS) 800-160 MG per tablet Take 1 tablet by mouth every Monday, Wednesday, and Friday.    [provider]  valGANciclovir (VALCYTE) 450 MG tablet Take 900 mg by mouth daily with supper.    [provider]    Family History Family History  Problem Relation Age of Onset  . Stomach cancer Father        ?  Marland Kitchen Hypertension Mother        On dialysis for years  . Hypertension Brother   . Hypertension Brother   . Hypertension Brother   . Hypertension Sister   . Breast cancer Sister        s/p mastectomy  . COPD Sister   . Hypertension Sister   . Kidney failure Sister        on dialysis  . Heart attack Paternal Grandfather   . Colon cancer Neg Hx   . Esophageal cancer Neg Hx   . Rectal cancer Neg Hx     Social History Social History  Substance Use Topics  . Smoking status: Former Smoker    Types: Cigarettes, Cigars    Quit date: 01/25/1998  . Smokeless tobacco: Never Used     Comment: occasional cigar  . Alcohol use No     Comment: very rare     Allergies   Iodinated diagnostic agents; Iodine; and Tape   Review of Systems Review of Systems  Constitutional: Positive for fatigue. Negative for activity change and fever.  HENT: Negative for congestion.   Respiratory: Negative for shortness of  breath.   Cardiovascular: Negative for chest pain.  Gastrointestinal: Negative for abdominal pain.  Neurological: Positive for seizures and headaches.  All other systems reviewed and are negative.    Physical Exam Updated Vital Signs BP (!) 146/86 (BP Location: Left Arm)   Physical Exam  Constitutional: He appears well-nourished.  HENT:  Head: Normocephalic.  Eyes: Conjunctivae are normal. Right eye exhibits no discharge. Left eye exhibits no discharge.  Neck: Normal range of motion.  Cardiovascular: Normal rate and  regular rhythm.   No murmur heard. Pulmonary/Chest: Effort normal and breath sounds normal. No respiratory distress. He has no wheezes.  Abdominal: Soft.  Mild tenderness diffusely  Neurological:  Odd affect, jittery, able to tell me his name, mild slowing, confusion  Skin: Skin is warm and dry. He is not diaphoretic.  Psychiatric: He has a normal mood and affect. His behavior is normal.     ED Treatments / Results  Labs (all labs ordered are listed, but only abnormal results are displayed) Labs Reviewed  CBC - Abnormal; Notable for the following:       Result Value   Hemoglobin 12.9 (*)    All other components within normal limits  COMPREHENSIVE METABOLIC PANEL - Abnormal; Notable for the following:    Potassium 3.3 (*)    Glucose, Bld 102 (*)    BUN 23 (*)    Creatinine, Ser 2.29 (*)    AST 81 (*)    GFR calc non Af Amer 27 (*)    GFR calc Af Amer 31 (*)    All other components within normal limits  I-STAT TROPONIN, ED - Abnormal; Notable for the following:    Troponin i, poc 0.10 (*)    All other components within normal limits  I-STAT CHEM 8, ED - Abnormal; Notable for the following:    Potassium 3.4 (*)    BUN 27 (*)    Creatinine, Ser 2.10 (*)    Glucose, Bld 100 (*)    All other components within normal limits  PROTIME-INR  APTT  DIFFERENTIAL  CK  BRAIN NATRIURETIC PEPTIDE  URINALYSIS, ROUTINE W REFLEX MICROSCOPIC  CBG MONITORING, ED     EKG  EKG Interpretation None       Radiology Mr Brain Wo Contrast  Result Date: 10/12/2016 CLINICAL DATA:  71 year old male code stroke. Found with abnormal mental status. Small right frontal lobe infarcts suspected on noncontrast head CT today. Outside of treatment window for IV tPA. EXAM: MRI HEAD WITHOUT CONTRAST TECHNIQUE: Multiplanar, multiecho pulse sequences of the brain and surrounding structures were obtained without intravenous contrast. COMPARISON:  Head CT without contrast 1409 hours today. FINDINGS: The examination had to be discontinued prior to completion due to altered mental status. Axial in coronal diffusion weighted imaging plus axial FLAIR imaging only was obtained, and is degraded by motion. Brain: There is abnormal mass like trace diffusion and FLAIR hyperintensity at the right vertex corresponding to the CT abnormality seen today, but this is facilitated on diffusion (series 300, image 44) such as due to vasogenic (non cytotoxic) edema. No significant regional mass effect. No definite abnormal diffusion elsewhere. Stable cerebral morphology compared to the earlier noncontrast head CT. IMPRESSION: 1.  The examination had to be discontinued prior to completion. 2. Abnormal signal at the right superior frontal gyrus corresponding to the CT finding, but compatible with vasogenic edema rather than acute or subacute infarct. Repeat Brain MRI without and with contrast recommended once the patient can tolerate. Electronically Signed   By: Genevie Ann M.D.   On: 10/12/2016 15:33   Ct Head Code Stroke Wo Contrast  Result Date: 10/13/2016 CLINICAL DATA:  Code stroke. Altered level of consciousness. History of hypertension, hyperlipidemia, end-stage renal disease on dialysis, RIGHT renal cancer. EXAM: CT HEAD WITHOUT CONTRAST TECHNIQUE: Contiguous axial images were obtained from the base of the skull through the vertex without intravenous contrast. COMPARISON:  None. FINDINGS: BRAIN: Focal  blurring of the gray-white matter differentiation RIGHT frontal convexity (axial 29/36)  with faint central density. Moderate parenchymal brain volume loss without hydrocephalus. No midline shift or mass effect. No intraparenchymal hemorrhage. Patchy additional white matter hypodensities. VASCULAR: Mild calcific atherosclerosis of the carotid siphons. SKULL: No skull fracture. Mild thinning RIGHT frontal calvarium. No destructive bony lesions. 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 No significant scalp soft tissue swelling. SINUSES/ORBITS: Mild paranasal sinus mucosal thickening, chronic maxillary sinusitis without air-fluid levels. Mastoid air cells are well aerated. The included ocular globes and orbital contents are non-suspicious. OTHER: Multiple absent teeth. ASPECTS [facility] Stroke Program Early CT Score) - Ganglionic level infarction (caudate, lentiform nuclei, internal capsule, insula, M1-M3 cortex): 7 - Supraganglionic infarction (M4-M6 cortex): 2 Total score (0-10 with 10 being normal): 9 IMPRESSION: 1. Small RIGHT frontal lobe infarct versus venous hypertension and cortical vein thrombosis versus metastasis. Recommend MRI of the head. Contrast enhanced imaging could be deferred at this time given patient's history of end-stage renal disease on dialysis. 2. ASPECTS is 9. 3. Moderate atrophy and mild chronic small vessel ischemic disease. 4. Critical Value/emergent results were called by telephone at the time of interpretation on 10/20/2016 at 2:14 pm to Dr. Leonel Ramsay, Neurology, who verbally acknowledged these results. Electronically Signed   By: Elon Alas M.D.   On: 10/20/2016 14:17    Procedures Procedures (including critical care time) CRITICAL CARE Performed by: Gardiner Sleeper Total critical care time:75 minutes Critical care time was exclusive of separately billable procedures and treating other  patients. Critical care was necessary to treat or prevent imminent or life-threatening deterioration. Critical care was time spent personally by me on the following activities: development of treatment plan with patient and/or surrogate as well as nursing, discussions with consultants, evaluation of patient's response to treatment, examination of patient, obtaining history from patient or surrogate, ordering and performing treatments and interventions, ordering and review of laboratory studies, ordering and review of radiographic studies, pulse oximetry and re-evaluation of patient's condition. Medications Ordered in ED Medications  sodium chloride 0.9 % bolus 1,000 mL (not administered)     Initial Impression / Assessment and Plan / ED Course  I have reviewed the triage vital signs and the nursing notes.  Pertinent labs & imaging results that were available during my care of the patient were reviewed by me and considered in my medical decision making (see chart for details).     He is a 71 year old male presenting with seizure-like activity. Patient has been feeling very fatigued lately per wife and today he called out around 3 PM . [name] went to go see him and found him to have crusted blood on his lips, black tongue, altered. Agents neighbor came over. EMS was called and patient was brought here to the emergency department. Patient was called code stroke. CT showed questionable stroke, emergently went to MRI, and then came back to the room with the EEG  3:56 PM Imaging seems to have vasogenic edema. Will touch base with neuro.   Patient has clear AK I, with troponin elevation. This could be from seizure-like activity.  EEG is on patietn.  Will plan to admit to medicine.    Likely seizure/ams from nidus of edema.   Protecting airway, afebrile (do not suspect infection) will admit for further workup.        Final Clinical Impressions(s) / ED Diagnoses   Final diagnoses:    None    New Prescriptions New Prescriptions   No medications on file  Macarthur Critchley, MD 10/25/16 1106

## 2016-10-22 ENCOUNTER — Encounter (HOSPITAL_COMMUNITY): Payer: Self-pay | Admitting: General Practice

## 2016-10-22 ENCOUNTER — Inpatient Hospital Stay (HOSPITAL_COMMUNITY): Payer: Medicare HMO

## 2016-10-22 DIAGNOSIS — R52 Pain, unspecified: Secondary | ICD-10-CM

## 2016-10-22 DIAGNOSIS — G9340 Encephalopathy, unspecified: Secondary | ICD-10-CM

## 2016-10-22 LAB — CBC
HCT: 42.8 % (ref 39.0–52.0)
HEMOGLOBIN: 14.3 g/dL (ref 13.0–17.0)
MCH: 29.7 pg (ref 26.0–34.0)
MCHC: 33.4 g/dL (ref 30.0–36.0)
MCV: 89 fL (ref 78.0–100.0)
PLATELETS: 140 10*3/uL — AB (ref 150–400)
RBC: 4.81 MIL/uL (ref 4.22–5.81)
RDW: 14.1 % (ref 11.5–15.5)
WBC: 8.4 10*3/uL (ref 4.0–10.5)

## 2016-10-22 LAB — COMPREHENSIVE METABOLIC PANEL
ALT: 48 U/L (ref 17–63)
ANION GAP: 16 — AB (ref 5–15)
AST: 209 U/L — AB (ref 15–41)
Albumin: 3.7 g/dL (ref 3.5–5.0)
Alkaline Phosphatase: 63 U/L (ref 38–126)
BUN: 18 mg/dL (ref 6–20)
CALCIUM: 9.1 mg/dL (ref 8.9–10.3)
CO2: 14 mmol/L — AB (ref 22–32)
CREATININE: 1.8 mg/dL — AB (ref 0.61–1.24)
Chloride: 112 mmol/L — ABNORMAL HIGH (ref 101–111)
GFR calc non Af Amer: 36 mL/min — ABNORMAL LOW (ref >60)
GFR, EST AFRICAN AMERICAN: 42 mL/min — AB (ref >60)
Glucose, Bld: 97 mg/dL (ref 65–99)
Potassium: 3.8 mmol/L (ref 3.5–5.1)
SODIUM: 142 mmol/L (ref 135–145)
Total Bilirubin: 1.7 mg/dL — ABNORMAL HIGH (ref 0.3–1.2)
Total Protein: 6.8 g/dL (ref 6.5–8.1)

## 2016-10-22 LAB — PHOSPHORUS: Phosphorus: 3 mg/dL (ref 2.5–4.6)

## 2016-10-22 LAB — TROPONIN I
TROPONIN I: 0.17 ng/mL — AB (ref <0.03)
TROPONIN I: 0.22 ng/mL — AB (ref <0.03)

## 2016-10-22 LAB — URINALYSIS, ROUTINE W REFLEX MICROSCOPIC
Bilirubin Urine: NEGATIVE
Glucose, UA: NEGATIVE mg/dL
Ketones, ur: 20 mg/dL — AB
Leukocytes, UA: NEGATIVE
Nitrite: NEGATIVE
PROTEIN: 30 mg/dL — AB
SPECIFIC GRAVITY, URINE: 1.014 (ref 1.005–1.030)
Squamous Epithelial / HPF: NONE SEEN
pH: 5 (ref 5.0–8.0)

## 2016-10-22 LAB — MAGNESIUM: MAGNESIUM: 1.9 mg/dL (ref 1.7–2.4)

## 2016-10-22 LAB — GLUCOSE, CAPILLARY: GLUCOSE-CAPILLARY: 81 mg/dL (ref 65–99)

## 2016-10-22 LAB — CK: CK TOTAL: 12724 U/L — AB (ref 49–397)

## 2016-10-22 MED ORDER — VALPROATE SODIUM 500 MG/5ML IV SOLN: 1000.0000 mg | Freq: Once | INTRAVENOUS | Status: DC

## 2016-10-22 MED ORDER — INFLUENZA VAC SPLIT HIGH-DOSE 0.5 ML IM SUSY
0.5000 mL | PREFILLED_SYRINGE | INTRAMUSCULAR | Status: DC
  Administered 2016-10-23: 0.5 mL via INTRAMUSCULAR
  Filled 2016-10-22: qty 0.5

## 2016-10-22 MED ORDER — SODIUM CHLORIDE 0.9 % IV SOLN
1500.0000 mg | Freq: Once | INTRAVENOUS | Status: AC
  Administered 2016-10-22: 1500 mg via INTRAVENOUS
  Filled 2016-10-22: qty 30

## 2016-10-22 MED ORDER — KETOROLAC TROMETHAMINE 30 MG/ML IJ SOLN: 30.0000 mg | Freq: Once | INTRAMUSCULAR | Status: DC

## 2016-10-22 MED ORDER — BARIUM SULFATE 2.1 % PO SUSP
ORAL | Status: AC
  Filled 2016-10-22: qty 2

## 2016-10-22 MED ORDER — DIPHENHYDRAMINE HCL 50 MG/ML IJ SOLN: 12.5000 mg | Freq: Two times a day (BID) | INTRAMUSCULAR | Status: DC

## 2016-10-22 MED ORDER — DEXAMETHASONE SODIUM PHOSPHATE 4 MG/ML IJ SOLN
4.0000 mg | Freq: Four times a day (QID) | INTRAMUSCULAR | Status: DC
  Administered 2016-10-22 – 2016-10-28 (×23): 4 mg via INTRAVENOUS
  Filled 2016-10-22 (×21): qty 1

## 2016-10-22 MED ORDER — PROCHLORPERAZINE EDISYLATE 5 MG/ML IJ SOLN: 10.0000 mg | Freq: Four times a day (QID) | INTRAMUSCULAR | Status: DC

## 2016-10-22 MED ORDER — PHENYTOIN SODIUM 50 MG/ML IJ SOLN
100.0000 mg | Freq: Three times a day (TID) | INTRAMUSCULAR | Status: DC
  Administered 2016-10-22 – 2016-10-23 (×2): 100 mg via INTRAVENOUS
  Filled 2016-10-22 (×3): qty 2

## 2016-10-22 NOTE — Evaluation (Signed)
Clinical/Bedside Swallow Evaluation Patient Details  Name: Roger Gibson MRN: [id number] Date of Birth: Sep 04, 1945  Today's Date: 10/22/2016 Time: SLP Start Time (ACUTE ONLY): 81 SLP Stop Time (ACUTE ONLY): 1130 SLP Time Calculation (min) (ACUTE ONLY): 27 min  Past Medical History:  Past Medical History:  Diagnosis Date  . Acute focal neurological deficit 09/2016  . Allergy   . Blood transfusion without reported diagnosis   . Cancer ([place])    right kidney ca  . Confusion    during admission to [facility] 2012, persisted after discharge  . ESRD (end stage renal disease) ([place])   . ESRD (end stage renal disease) on dialysis ([place])    T, Th, Sat HD in [place], [place]  . Gout 01/2002   Right knee; Left great toe  . HLD (hyperlipidemia) 1999  . HTN (hypertension) 1974  . Iron deficiency anemia    PRE 2004/06/2000  . Renal cysts, acquired, bilateral    per Darol Destine   Past Surgical History:  Past Surgical History:  Procedure Laterality Date  . [place]  05/15/03   Polyp (biopsy negative) repeat in 5 years  . COLONOSCOPY  07/18/08   1 rectal polyp (Dr. Wynetta Emery) 5 years  . CT ABD W & PELVIS WO CM  03/03/06   Right kidney absent; Left with cystic changes  . CYSTOSCOPY  03/03/06   Mild to mod. BPH observed/ otherwise normal  . 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 MR MRA ABDOMEN  2000   3x3 mass left kidney  . NEPHRECTOMY  1965   [place]  . NEPHRECTOMY  2000   partial-(2nd to renal carcinoma) Dr. [place] Crane [facility]   HPI:  Roger Weatherall Thomasis a 71 y.o.malewith medical history significant for hypertension, chronic combined systolic/diastolic CHF, history of right kidney cancer, end-stage renal disease status post renal transplantation, that presented to the emergency department with  left-sided weakness. Noncontrast head CT reveals a small right frontal lobe abnormality of uncertain etiology. This was followed by MRI brain which was aborted early due to patient intolerance, but notable for abnormal signal at the right superior frontal gyrus consistent with vasogenic edema. He was admitted to the telemetry unit for ongoing evaluation and management of acute neurologic deficits with vasogenic edema noted on a limited MRI.   Assessment / Plan / Recommendation Clinical Impression   Pt presents with a moderate oral phase dysphagia resulting from decreased attention to boluses in the setting of lethargy and acute confusion.  Pt needed max assist multimodal cues to monitor and correct oral holding to safely transit boluses to initiate a swallow.  Once transited, swallow response appeared swift and pt demonstrated no overt s/s of aspiration with purees or thin liquids via straw sips.  As a result, recommend initiating pt on a diet of dys 1 textures and thin liquids with full supervision for use of precautions.  Recommend that pt only be fed when he is fully awake and alert.  Discussed recommendations with pt's wife and RN  who was in agreement with plan of care.    SLP Visit Diagnosis: Dysphagia, oral phase (R13.11)    Aspiration Risk  Moderate aspiration risk    Diet Recommendation Dysphagia 1 (Puree);Thin liquid   Liquid Administration via: Cup;Straw Medication Administration: Crushed with puree Supervision: Patient able to  self feed;Full supervision/cueing for compensatory strategies Compensations: Minimize environmental distractions;Slow rate;Small sips/bites Postural Changes: Seated upright at 90 degrees    Other  Recommendations Oral Care Recommendations: Oral care BID   Follow up Recommendations Other (comment) (TBD)      Frequency and Duration min 2x/week          Prognosis Prognosis for Safe Diet Advancement: Good      Swallow Study   General Date of Onset:   (see HPI) HPI: Roger Gibson a 71 y.o.malewith medical history significant for hypertension, chronic combined systolic/diastolic CHF, history of right kidney cancer, end-stage renal disease status post renal transplantation, that presented to the emergency department with left-sided weakness. Noncontrast head CT reveals a small right frontal lobe abnormality of uncertain etiology. This was followed by MRI brain which was aborted early due to patient intolerance, but notable for abnormal signal at the right superior frontal gyrus consistent with vasogenic edema. He was admitted to the telemetry unit for ongoing evaluation and management of acute neurologic deficits with vasogenic edema noted on a limited MRI. Type of Study: Bedside Swallow Evaluation Previous Swallow Assessment: none on record Diet Prior to this Study: NPO Temperature Spikes Noted: No Respiratory Status: Room air History of Recent Intubation: No Behavior/Cognition: Lethargic/Drowsy;Cooperative;Pleasant mood;Confused Oral Cavity Assessment: Lesions (bruising on tongue bilaterally) Oral Cavity - Dentition: Missing dentition (pt wears a partial, not present for eval) Vision: Functional for self-feeding Self-Feeding Abilities: Able to feed self;Needs assist Patient Positioning: Upright in bed Baseline Vocal Quality: Normal Volitional Cough: Strong Volitional Swallow: Able to elicit    Oral/Motor/Sensory Function Overall Oral Motor/Sensory Function: Within functional limits   Ice Chips     Thin Liquid Thin Liquid: Impaired Presentation: Straw Oral Phase Impairments: Poor awareness of bolus Oral Phase Functional Implications: Oral holding;Prolonged oral transit    Nectar Thick     Honey Thick     Puree Puree: Impaired Presentation: Spoon Oral Phase Impairments: Poor awareness of bolus Oral Phase Functional Implications: Oral holding;Prolonged oral transit   Solid   GO            [name], [name] L 10/22/2016,11:54  AM

## 2016-10-22 NOTE — Progress Notes (Signed)
Text paged Dr Myna Hidalgo to notify patient having hard time swallowing, wife  Is requesting Kdur to be switched to IV. Awaiting call back

## 2016-10-22 NOTE — Progress Notes (Signed)
PT Cancellation Note  Patient Details Name: Roger Gibson MRN: [id number] DOB: 1945-08-18   Cancelled Treatment:    Reason Eval/Treat Not Completed: Medical issues which prohibited therapy.  Troponins trending up at this point.  Will defer eval at this time and see tomorrow as able. 10/22/2016  Donnella Sham, [place] [phone]  (pager)   Tessie Fass Evanie Buckle 10/22/2016, 2:11 PM

## 2016-10-22 NOTE — Progress Notes (Signed)
PROGRESS NOTE    Roger Gibson  PFX:[id number] DOB: 02-12-1945 DOA: 10/24/2016 PCP: Roger Low, MD   Brief Narrative:  Roger Gibson is a 71 y.o. male with medical history significant for hypertension, chronic combined systolic/diastolic CHF, history of right kidney cancer, end-stage renal disease status post renal transplantation, that presented to the emergency department with left-sided weakness. History is obtained from the patient's wife at the bedside. He had reportedly been in his usual state last night, driving them to dinner and back home. He went to bed as usual around 11 PM, was noted to sleep in which is not unusual for him, but then called out to his wife at approximately 1 PM, reporting difficulty getting out of bed. She had noted earlier that he was sleeping with his left leg hanging off the bed, and she was not strong enough to help stand him up. A neighbor came to help get the patient up, at which point he was noted to have dried blood caked about his mouth, and EMS was called. Patient complained of some abdominal discomfort with EMS, but had not been expressing any recent complaints aside from some minor knee pain.  Upon arrival to the ED, patient is found to be afebrile, saturating well on room air, slightly tachypneic, and with vitals otherwise stable. Noncontrast head CT reveals a small right frontal lobe abnormality of uncertain etiology. This was followed by MRI brain which was aborted early due to patient intolerance, but notable for abnormal signal at the right superior frontal gyrus consistent with vasogenic edema. Neurology was consulted and patient has undergone EEG. He was treated with a liter of normal saline in the ED. He was admitted to the telemetry unit for ongoing evaluation and management of acute neurologic deficits with vasogenic edema noted on a limited MRI and suspected Seizure. Neurology placed patient on Antiepileptics. Troponins increased and Discussed  Case with Cardiology who recommended to obtain an ECHO and outpatient stress test. Currently still don't know what frontal mass with vasogenic edema and is being worked up.   Assessment & Plan:   Principal Problem:   Acute focal neurological deficit Active Problems:   Hypokalemia   History of renal transplant   Immunosuppressed status (HCC)   Acute kidney injury (Roger Gibson)   CKD (chronic kidney disease), stage III (HCC)   Elevated troponin   Vasogenic brain edema (HCC)   Acute encephalopathy   Chronic combined systolic and diastolic CHF (congestive heart failure) (Roger Gibson)  1. Acute neurologic event suspect Seizure with Todd's Paralysis in the setting of an ill-defined hypodensity in the Frontal Lobe with slight vasogenic brain edema  - Pt presented with left-sided weakness after he was last seen normal at 23:00 the night before  - Dried blood about the mouth raised suspicion for seizure and EEG was performed,  - EEG recorded evidence of orbital dysfunction in the right posterior quadrant, suggestive of irritability. No seizure was recorded - CT Code Stroke with Small RIGHT frontal lobe infarct versus venous hypertension and cortical vein thrombosis versus metastasis.Moderate atrophy and mild chronic small vessel ischemic disease;  - MRI limited as was aborted early d/t pt intolerance, but revealed abnormal signal at the right superior frontal gyrus corresponding to the CT finding, but compatible with vasogenic edema rather than acute or subacute infarct. - MRA done but showed Severely motion degraded examination without internal carotid artery or basilar artery occlusion. Nondiagnostic for assessment of emergent large vessel occlusion. - Head CT ordered by Neurology this  Afternoon showed Ill-defined hypodensity in the high right frontal lobe unchanged. The MRI was not diagnostic. This could represent edema related to infarct or tumor. There is mild mass-effect and effacement of the sulci.  - I  discussed the Case with Neurology Dr. Leonel Ramsay personally and he is going to be ordering steroids for suspected vasogenic edema - Neurology ordered IV Fosphenytoin 1500 mg and IV Keppra 1000 mg q12h - Neurology consulted will follow-up on recommendations  - Plan to sedate pt for repeat MRI/MRA possible tomorrow - Continue seizure precautions, continue frequent neuro checks, supportive care  - SLP done and recommending Dysphagia 1 Puree Diet with Thin Liquids - Per my discussion with Dr. Tobias Alexander will need to rule out Metastasis and recommended ordering CT Chest/Abd/Pelvis  2. ESRD s/p Renal Transplantation - SCr is 2.29 on admission, up from 1.5 in August 2018  - He has Hx of right renal cancer and ESRD, now with transplant kidney  - Continue Gibson-dose Prednisone 5 mg po Daily, Mycophenolate 180 mg po BID, and Tacrolimus 4 mg po BID  3. Acute kidney superimposed on CKD stage III  - SCr is 2.29 on admission, up from 1.5 in August 2018  - He was given a liter of NS in ED and was continued on IVF hydration overnight  - BUN/Cr went from 23/2.29 -> 18/1.80 - Continue immunosuppressants as above - Obtain CT Chest/Abd/Pelvis w/o Contrast - Repeat CMP in AM  4. Hypokalemia  - Serum potassium 3.3 on admission  - Replaced conservatively in light of kidney disease, treated with 20 mEq oral supplementation - K+ now 1.80 - Repeat CMP in AM  5. Chronic Combined Systolic/Diastolic CHF  - Pt appeared hypovolemic on arrival and was treated with 1 L NS in ED  - Hold Lasix initially,  - Follow strict I/O's and daily wts,  - Resume beta-blocker as tolerated    6. Hypertension  - BP at goal in ED  - Managed at home with labetalol - Will use prn labetalol IVP's until passes swallow screen    7. Elevated Troponin - Troponin elevated to 0.10 on admission and increased to 0.22 along with CK which was 12,724 - No anginal complaints to suggest ACS  - Plan to continue cardiac monitoring -  Ordered ECHOCardiogram - Discussed Case with Cardiologist Dr. Oval Linsey who evaluated patient's EKG and recommends not to continue to trend troponin and recommends outpatient Stress Test and Evaluation - Patient scheduled for follow at D/C with Cardiologist   DVT prophylaxis: SCDs Code Status: FULL CODE Family Communication: Discussed with wife at bedside Disposition Plan: Remain Inpatient  Consultants:   Neurology Dr. Leonel Ramsay  Discussed Case with Cardiologist Dr. Oval Linsey who recommended outpatient ischemic evaluation/stress test     Procedures:  ECHOCARDIOGRAM  EEG EEG Abnormalities: 1) lateralized periodic discharges in the right posterior quadrant with delta wave morphology 2) utilize irregular slow activity  Clinical Interpretation: This EEG recorded evidence of orbital dysfunction in the right posterior quadrant, suggestive of irritability. No seizure was recorded.   Antimicrobials: Anti-infectives    None     Subjective: Seen and examined at bedside and wanted the mittens off. No Nausea or vomiting. No other concerns or complaints and wife at bedside.   Objective: Vitals:   10/20/2016 2211 10/22/16 0630 10/22/16 1457 10/22/16 1609  BP: (!) 165/82 (!) 168/96 (!) 160/87   Pulse: 96 (!) 117 (!) 118   Resp: (!) 22 (!) 28 (!) 25   Temp: 98.6 F (37 C)  98.7 F (37.1 C) (!) 100.8 F (38.2 C) 99.7 F (37.6 C)  TempSrc: Oral Oral Oral Oral  SpO2: 99% 97% 97%     Intake/Output Summary (Last 24 hours) at 10/22/16 1729 Last data filed at 10/22/16 1500  Gross per 24 hour  Intake          2348.75 ml  Output              500 ml  Net          1848.75 ml   There were no vitals filed for this visit.  Examination: Physical Exam:  Constitutional: WN/WD AAM in NAD and appears calm but wearing safety mittens Eyes: Lids and conjunctivae normal, sclerae anicteric  ENMT: External Ears, Nose appear normal. Grossly normal hearing. Mucous membranes are moist..  Neck:  Appears normal, supple, no cervical masses, normal ROM, no appreciable thyromegaly, no JVD Respiratory: Clear to auscultation bilaterally, no wheezing, rales, rhonchi or crackles. Normal respiratory effort and patient is not tachypenic. No accessory muscle use.  Cardiovascular: RRR, no murmurs / rubs / gallops. S1 and S2 auscultated. No extremity edema.  Abdomen: Soft, non-tender, mildly distended. No masses palpated. No appreciable hepatosplenomegaly. Bowel sounds positive.  GU: Deferred. Musculoskeletal: No clubbing / cyanosis of digits/nails. No joint deformity upper and lower extremities. Good ROM, no contractures. Normal strength and muscle tone.  Skin: No rashes, lesions, ulcers. No induration; Warm and dry.  Neurologic: CN 2-12 grossly intact with no focal deficits but has mild left facial droop. Has some dysarthria. Romberg sign cerebellar reflexes not assessed.  Psychiatric: Impaired judgment and insight. Normal appearing mood and affect.   Data Reviewed: I have personally reviewed following labs and imaging studies  CBC:  Recent Labs Lab 10/01/2016 1345 10/20/2016 1402 10/22/16 0638  WBC 7.0  --  8.4  NEUTROABS 5.3  --   --   HGB 12.9* 13.9 14.3  HCT 39.1 41.0 42.8  MCV 87.9  --  89.0  PLT 158  --  341*   Basic Metabolic Panel:  Recent Labs Lab 10/01/2016 1345 10/09/2016 1402 10/22/16 0638 10/22/16 0830  NA 141 142 142  --   K 3.3* 3.4* 3.8  --   CL 108 108 112*  --   CO2 22  --  14*  --   GLUCOSE 102* 100* 97  --   BUN 23* 27* 18  --   CREATININE 2.29* 2.10* 1.80*  --   CALCIUM 9.7  --  9.1  --   MG  --   --   --  1.9  PHOS  --   --   --  3.0   GFR: CrCl cannot be calculated (Unknown ideal weight.). Liver Function Tests:  Recent Labs Lab 09/30/2016 1345 10/22/16 0638  AST 81* 209*  ALT 26 48  ALKPHOS 67 63  BILITOT 1.1 1.7*  PROT 7.1 6.8  ALBUMIN 4.0 3.7   No results for input(s): LIPASE, AMYLASE in the last 168 hours. No results for input(s): AMMONIA in  the last 168 hours. Coagulation Profile:  Recent Labs Lab 10/08/2016 1345  INR 1.08   Cardiac Enzymes:  Recent Labs Lab 10/15/2016 1919 10/09/2016 2325 10/22/16 0638  CKTOTAL 9,711*  --  12,724*  TROPONINI 0.13* 0.17* 0.22*   BNP (last 3 results) No results for input(s): PROBNP in the last 8760 hours. HbA1C: No results for input(s): HGBA1C in the last 72 hours. CBG:  Recent Labs Lab 10/03/2016 1620 10/22/16 0743  GLUCAP 111*  81   Lipid Profile: No results for input(s): CHOL, HDL, LDLCALC, TRIG, CHOLHDL, LDLDIRECT in the last 72 hours. Thyroid Function Tests: No results for input(s): TSH, T4TOTAL, FREET4, T3FREE, THYROIDAB in the last 72 hours. Anemia Panel: No results for input(s): VITAMINB12, FOLATE, FERRITIN, TIBC, IRON, RETICCTPCT in the last 72 hours. Sepsis Labs: No results for input(s): PROCALCITON, LATICACIDVEN in the last 168 hours.  No results found for this or any previous visit (from the past 240 hour(s)).   Radiology Studies: Dg Abd 1 View  Result Date: 10/14/2016 CLINICAL DATA:  Abdominal pain EXAM: ABDOMEN - 1 VIEW COMPARISON:  12/04/2010 CT FINDINGS: Right lung base is clear. Multiple surgical clips in the left upper abdomen. Nonobstructed gas pattern with moderate upper quadrant stool. Clips in the right lower quadrant. Calcified pelvic phleboliths. IMPRESSION: Nonobstructed bowel-gas pattern. Electronically Signed   By: Donavan Foil M.D.   On: 09/29/2016 21:16   Ct Head Wo Contrast  Result Date: 10/22/2016 CLINICAL DATA:  Left-sided weakness.  Focal neuro deficit EXAM: CT HEAD WITHOUT CONTRAST TECHNIQUE: Contiguous axial images were obtained from the base of the skull through the vertex without intravenous contrast. COMPARISON:  MRI and CT 10/20/2016 FINDINGS: Brain: Ill-defined hypodensity in the right frontal cortex over the convexity unchanged from prior CT. There appears to be effacement of sulci in this area. MRI was indeterminate and incomplete.  Negative for acute hemorrhage. Generalized atrophy with chronic ischemic change in the white matter. Vascular: Negative for hyperdense vessel Skull: Negative Sinuses/Orbits: Mild mucosal edema paranasal sinuses. Negative orbit Other: None IMPRESSION: Ill-defined hypodensity in the high right frontal lobe unchanged. The MRI was not diagnostic. This could represent edema related to infarct or tumor. There is mild mass-effect and effacement of the sulci. Repeat MRI is suggested when the patient is able to complete the study. If MRI is not possible, CT without and with contrast suggested to evaluate for enhancing mass Atrophy with chronic microvascular ischemia. Electronically Signed   By: Franchot Gallo M.D.   On: 10/22/2016 13:32   Mr Jodene Nam Head Wo Contrast  Result Date: 10/13/2016 CLINICAL DATA:  Seizure like activity, fatigue. EXAM: MRA HEAD WITHOUT CONTRAST TECHNIQUE: Angiographic images of the Circle of Willis were obtained using MRA technique without intravenous contrast. COMPARISON:  MRI of the head October 21, 2016 at 1445 hours FINDINGS: Severely motion degraded examination. ANTERIOR CIRCULATION: Flow related enhancement internal carotid artery's, proximal MCA and possibly ACA vessels. POSTERIOR CIRCULATION: Flow related enhancement within RIGHT and possibly LEFT vertebral artery, basilar artery and proximal posterior cerebral artery's. Source images and MIP images were reviewed. IMPRESSION: Severely motion degraded examination without internal carotid artery or basilar artery occlusion. Nondiagnostic for assessment of emergent large vessel occlusion. Electronically Signed   By: Elon Alas M.D.   On: 10/03/2016 21:02   Mr Brain Wo Contrast  Result Date: 10/11/2016 CLINICAL DATA:  71 year old male code stroke. Found with abnormal mental status. Small right frontal lobe infarcts suspected on noncontrast head CT today. Outside of treatment window for IV tPA. EXAM: MRI HEAD WITHOUT CONTRAST  TECHNIQUE: Multiplanar, multiecho pulse sequences of the brain and surrounding structures were obtained without intravenous contrast. COMPARISON:  Head CT without contrast 1409 hours today. FINDINGS: The examination had to be discontinued prior to completion due to altered mental status. Axial in coronal diffusion weighted imaging plus axial FLAIR imaging only was obtained, and is degraded by motion. Brain: There is abnormal mass like trace diffusion and FLAIR hyperintensity at the right vertex corresponding  to the CT abnormality seen today, but this is facilitated on diffusion (series 300, image 44) such as due to vasogenic (non cytotoxic) edema. No significant regional mass effect. No definite abnormal diffusion elsewhere. Stable cerebral morphology compared to the earlier noncontrast head CT. IMPRESSION: 1.  The examination had to be discontinued prior to completion. 2. Abnormal signal at the right superior frontal gyrus corresponding to the CT finding, but compatible with vasogenic edema rather than acute or subacute infarct. Repeat Brain MRI without and with contrast recommended once the patient can tolerate. Electronically Signed   By: Genevie Ann M.D.   On: 09/29/2016 15:33   Dg Chest Portable 1 View  Result Date: 10/06/2016 CLINICAL DATA:  Shortness of breath EXAM: PORTABLE CHEST 1 VIEW COMPARISON:  05/14/2014 FINDINGS: Vascular stent over the upper mediastinum. Gibson lung volumes. Mild cardiomegaly. No infiltrate or effusion. Aortic atherosclerosis. IMPRESSION: Gibson lung volumes with mild cardiomegaly.  No infiltrate or edema. Electronically Signed   By: Donavan Foil M.D.   On: 10/16/2016 16:35   Ct Head Code Stroke Wo Contrast  Result Date: 10/05/2016 CLINICAL DATA:  Code stroke. Altered level of consciousness. History of hypertension, hyperlipidemia, end-stage renal disease on dialysis, RIGHT renal cancer. EXAM: CT HEAD WITHOUT CONTRAST TECHNIQUE: Contiguous axial images were obtained from the base of  the skull through the vertex without intravenous contrast. COMPARISON:  None. FINDINGS: BRAIN: Focal blurring of the gray-white matter differentiation RIGHT frontal convexity (axial 29/36) with faint central density. Moderate parenchymal brain volume loss without hydrocephalus. No midline shift or mass effect. No intraparenchymal hemorrhage. Patchy additional white matter hypodensities. VASCULAR: Mild calcific atherosclerosis of the carotid siphons. SKULL: No skull fracture. Mild thinning RIGHT frontal calvarium. No destructive bony lesions. 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 No significant scalp soft tissue swelling. SINUSES/ORBITS: Mild paranasal sinus mucosal thickening, chronic maxillary sinusitis without air-fluid levels. Mastoid air cells are well aerated. The included ocular globes and orbital contents are non-suspicious. OTHER: Multiple absent teeth. ASPECTS [facility] Stroke Program Early CT Score) - Ganglionic level infarction (caudate, lentiform nuclei, internal capsule, insula, M1-M3 cortex): 7 - Supraganglionic infarction (M4-M6 cortex): 2 Total score (0-10 with 10 being normal): 9 IMPRESSION: 1. Small RIGHT frontal lobe infarct versus venous hypertension and cortical vein thrombosis versus metastasis. Recommend MRI of the head. Contrast enhanced imaging could be deferred at this time given patient's history of end-stage renal disease on dialysis. 2. ASPECTS is 9. 3. Moderate atrophy and mild chronic small vessel ischemic disease. 4. Critical Value/emergent results were called by telephone at the time of interpretation on 10/20/2016 at 2:14 pm to Dr. Leonel Ramsay, Neurology, who verbally acknowledged these results. Electronically Signed   By: Elon Alas M.D.   On: 10/20/2016 14:17   Scheduled Meds: . cholecalciferol  2,000 Units Oral Daily  . [START ON 10/23/2016] Influenza vac split quadrivalent PF  0.5 mL  Intramuscular Tomorrow-1000  . mycophenolate  180 mg Oral BID  . pravastatin  20 mg Oral q1800  . predniSONE  5 mg Oral Q breakfast  . sodium bicarbonate  650 mg Oral BID  . sodium chloride flush  3 mL Intravenous Q12H  . tacrolimus  4 mg Oral BID  . tamsulosin  0.4 mg Oral QPC supper   Continuous Infusions: . sodium chloride 75 mL/hr at 10/02/2016 2310  . levETIRAcetam Stopped (10/22/16 1047)    LOS: 1 day   Kerney Elbe, DO [facility] Pager [phone]  If 7PM-7AM, please  contact night-coverage [email or web address] Password [email or web address] 10/22/2016, 5:29 PM

## 2016-10-22 NOTE — Progress Notes (Signed)
Pt's very sleepy & difficult to arouse to stay awake to drink the 2 CT contrast bottles. NP was paged if pt can have the CT without the contrast. MD called back and advised that the order was without contrast. CT advised they would go ahead and send for transport to get the pt. Will continue to monitor.

## 2016-10-22 NOTE — Progress Notes (Signed)
OT Cancellation Note  Patient Details Name: Roger Gibson MRN: [id number] DOB: [birth year]   Cancelled Treatment:    Reason Eval/Treat Not Completed: Medical issues which prohibited therapy. Noted that troponin trending up, will hold eval.  Almon Register [id number] 10/22/2016, 9:22 AM

## 2016-10-22 NOTE — Progress Notes (Signed)
Subjective: No family members present. The patient complains of left shoulder pain which he says started approximately 2-3 days ago. He denies specific injury. He is oriented but able to follow only some simple commands.  Objective: Current vital signs: BP (!) 168/96 (BP Location: Left Arm)   Pulse (!) 117   Temp 98.7 F (37.1 C) (Oral)   Resp (!) 28   SpO2 97%  Vital signs in last 24 hours: Temp:  [98.4 F (36.9 C)-98.7 F (37.1 C)] 98.7 F (37.1 C) (09/28 0630) Pulse Rate:  [86-117] 117 (09/28 0630) Resp:  [22-33] 28 (09/28 0630) BP: (146-174)/(82-96) 168/96 (09/28 0630) SpO2:  [97 %-100 %] 97 % (09/28 0630)  Intake/Output from previous day: 09/27 0701 - 09/28 0700 In: 1000 [IV Piggyback:1000] Out: -  Intake/Output this shift: No intake/output data recorded. Nutritional status: Diet NPO time specified  Physical Exam  General - 71 year old male in bed, easily distractible, but no acute distress. Wearing restraint mittens. Heart - Regular rate and rhythm - no murmer Lungs - Clear to auscultation - oor inspiratory effort Abdomen - Soft - non tender Extremities - Distal pulses weak to absent - no edema Skin - Warm and dry   Neurologic Exam: NEUROLOGIC:   MENTAL STATUS: awake, alert, oriented, follows most simple commands.  CRANIAL NERVES: pupils equal and reactive to light,extraocular motions - patient unable to follow. Left lower facial droop MOTOR: Strength - RUE 4-5/5 ; LUE  0/5 (may be due to pain) ; RLE - 3/5  ; LLE - 0/5 SENSORY: normal and symmetric to light touch  CEREBELLAR: unable to follow commands for testing REFLEXES: 0 GAIT: Deferred at this time     Lab Results: Basic Metabolic Panel:  Recent Labs Lab 10/06/2016 1345 09/30/2016 1402 10/22/16 0638 10/22/16 0830  NA 141 142 142  --   K 3.3* 3.4* 3.8  --   CL 108 108 112*  --   CO2 22  --  14*  --   GLUCOSE 102* 100* 97  --   BUN 23* 27* 18  --   CREATININE 2.29* 2.10* 1.80*  --   CALCIUM 9.7   --  9.1  --   MG  --   --   --  1.9  PHOS  --   --   --  3.0    Liver Function Tests:  Recent Labs Lab 10/07/2016 1345 10/22/16 0638  AST 81* 209*  ALT 26 48  ALKPHOS 67 63  BILITOT 1.1 1.7*  PROT 7.1 6.8  ALBUMIN 4.0 3.7   No results for input(s): LIPASE, AMYLASE in the last 168 hours. No results for input(s): AMMONIA in the last 168 hours.  CBC:  Recent Labs Lab 10/01/2016 1345 09/30/2016 1402 10/22/16 0638  WBC 7.0  --  8.4  NEUTROABS 5.3  --   --   HGB 12.9* 13.9 14.3  HCT 39.1 41.0 42.8  MCV 87.9  --  89.0  PLT 158  --  140*    Cardiac Enzymes:  Recent Labs Lab 09/30/2016 1919 10/08/2016 2325 10/22/16 0638  CKTOTAL 9,711*  --   --   TROPONINI 0.13* 0.17* 0.22*    Lipid Panel: No results for input(s): CHOL, TRIG, HDL, CHOLHDL, VLDL, LDLCALC in the last 168 hours.  CBG:  Recent Labs Lab 09/30/2016 1620 10/22/16 0743  GLUCAP 111* 81    Microbiology: Results for orders placed or performed during the hospital encounter of 05/14/14  Blood culture (routine x 2)  Status: None   Collection Time: 05/14/14  5:47 PM  Result Value Ref Range Status   Specimen Description BLOOD LEFT WRIST  Final   Special Requests BOTTLES DRAWN AEROBIC ONLY 5ML  Final   Culture   Final    NO GROWTH 5 DAYS Performed at [facility]    Report Status 05/21/2014 FINAL  Final  Blood culture (routine x 2)     Status: None   Collection Time: 05/14/14  5:52 PM  Result Value Ref Range Status   Specimen Description BLOOD LEFT FOREARM  Final   Special Requests BOTTLES DRAWN AEROBIC AND ANAEROBIC 10ML  Final   Culture   Final    NO GROWTH 5 DAYS Performed at [facility]    Report Status 05/21/2014 FINAL  Final  Urine culture     Status: None   Collection Time: 05/14/14  8:10 PM  Result Value Ref Range Status   Specimen Description URINE, RANDOM  Final   Special Requests Immunocompromised  Final   Colony Count   Final    15,000 COLONIES/ML Performed at [facility]    Culture   Final    Multiple bacterial morphotypes present, none predominant. Suggest appropriate recollection if clinically indicated. Performed at [facility]    Report Status 05/16/2014 FINAL  Final    Coagulation Studies:  Recent Labs  10/01/2016 1345  LABPROT 13.9  INR 1.08    Imaging:  Dg Abd 1 View 10/18/2016 Nonobstructed bowel-gas pattern.   Mr Jodene Nam Head Wo Contrast 10/06/2016 Severely motion degraded examination without internal carotid artery or basilar artery occlusion. Nondiagnostic for assessment of emergent large vessel occlusion.  Mr Brain Wo Contrast 10/08/2016 IMPRESSION:  1. The examination had to be discontinued prior to completion.  2. Abnormal signal at the right superior frontal gyrus corresponding to the CT finding, but compatible with vasogenic edema rather than acute or subacute infarct. Repeat Brain MRI without and with contrast recommended once the patient can tolerate.  Dg Chest Portable 1 View 09/27/2016 IMPRESSION: Low lung volumes with mild cardiomegaly.  No infiltrate or edema.    Ct Head Code Stroke Wo Contrast 09/26/2016 IMPRESSION:  1. Small RIGHT frontal lobe infarct versus venous hypertension and cortical vein thrombosis versus metastasis. Recommend MRI of the head. Contrast enhanced imaging could be deferred at this time given patient's history of end-stage renal disease on dialysis.  2. ASPECTS is 9.  3. Moderate atrophy and mild chronic small vessel ischemic disease.    EEG 09/26/2016 EEG Abnormalities: 1) lateralized periodic discharges in the right posterior quadrant with delta wave morphology 2) utilize irregular slow activity Clinical Interpretation: This EEG recorded evidence of orbital dysfunction in the right posterior quadrant, suggestive of irritability. No seizure was recorded.   Medications:  Current Facility-Administered Medications  Medication Dose Route Frequency Provider Last Rate Last Dose   . 0.9 %  sodium chloride infusion   Intravenous Continuous Opyd, Ilene Qua, MD 75 mL/hr at 10/17/2016 2310    . acetaminophen (TYLENOL) tablet 650 mg  650 mg Oral Q6H PRN Opyd, Ilene Qua, MD       Or  . acetaminophen (TYLENOL) suppository 650 mg  650 mg Rectal Q6H PRN Opyd, Ilene Qua, MD      . bisacodyl (DULCOLAX) suppository 10 mg  10 mg Rectal Daily PRN Opyd, Ilene Qua, MD      . cholecalciferol (VITAMIN D) tablet 2,000 Units  2,000 Units Oral Daily Opyd, Ilene Qua, MD      .  labetalol (NORMODYNE,TRANDATE) injection 5-10 mg  5-10 mg Intravenous Q2H PRN Opyd, Ilene Qua, MD      . levETIRAcetam (KEPPRA) 1,000 mg in sodium chloride 0.9 % 100 mL IVPB  1,000 mg Intravenous Q12H Greta Doom, MD   Stopped at 09/27/2016 2325  . morphine 4 MG/ML injection 1-3 mg  1-3 mg Intravenous Q4H PRN Opyd, Ilene Qua, MD      . mycophenolate (MYFORTIC) EC tablet 180 mg  180 mg Oral BID Opyd, Ilene Qua, MD   180 mg at 10/23/2016 2300  . ondansetron (ZOFRAN) tablet 4 mg  4 mg Oral Q6H PRN Opyd, Ilene Qua, MD       Or  . ondansetron (ZOFRAN) injection 4 mg  4 mg Intravenous Q6H PRN Opyd, Ilene Qua, MD      . pravastatin (PRAVACHOL) tablet 20 mg  20 mg Oral q1800 Opyd, Ilene Qua, MD      . predniSONE (DELTASONE) tablet 5 mg  5 mg Oral Q breakfast Opyd, Ilene Qua, MD      . senna-docusate (Senokot-S) tablet 1 tablet  1 tablet Oral QHS PRN Opyd, Ilene Qua, MD      . sodium bicarbonate tablet 650 mg  650 mg Oral BID Opyd, Ilene Qua, MD      . sodium chloride flush (NS) 0.9 % injection 3 mL  3 mL Intravenous Q12H Opyd, Ilene Qua, MD   3 mL at 10/22/2016 2308  . tacrolimus (PROGRAF) capsule 4 mg  4 mg Oral BID Opyd, Ilene Qua, MD   4 mg at 10/20/2016 2302  . tamsulosin (FLOMAX) capsule 0.4 mg  0.4 mg Oral QPC supper Opyd, Ilene Qua, MD        Scheduled: . cholecalciferol  2,000 Units Oral Daily  . mycophenolate  180 mg Oral BID  . pravastatin  20 mg Oral q1800  . predniSONE  5 mg Oral Q breakfast  . sodium  bicarbonate  650 mg Oral BID  . sodium chloride flush  3 mL Intravenous Q12H  . tacrolimus  4 mg Oral BID  . tamsulosin  0.4 mg Oral QPC supper    Assessment/Plan: 71 year old male was a history of hypertension, hyperlipidemia, end-stage renal disease, renal carcinoma,and recent neurology consult for new onset seizures. Currently on Keppra 1,000 mg Q12 hours and prn ativan. Consider repeat head CT versus MRI if patient able to tolerate.   Dr Leonel Ramsay to see.   Mikey Bussing PA-C [facility] Pager [phone] 10/22/2016, 9:39 AM  He continues to have deficits, but does appear to have improved some to me. With the PLEDs yesterday, I would favor more aggressive treatment and therefore I have added fosphenytoin. With some edema, he may benefit from dexamethasone and I have ordered this as well.  Continue Keppra 1 g twice a day Continue phenytoin 100 mg 3 times a day following load Dexamethasone 4 mg every 6 hours Once he is able to tolerated I would favor getting an repeat MRI to better characterize the lesion.  Roland Rack, MD [facility] [phone]  If 7pm- 7am, please page neurology on call as listed in [place].

## 2016-10-23 ENCOUNTER — Inpatient Hospital Stay (HOSPITAL_COMMUNITY): Payer: Medicare HMO

## 2016-10-23 DIAGNOSIS — E872 Acidosis, unspecified: Secondary | ICD-10-CM

## 2016-10-23 DIAGNOSIS — R0603 Acute respiratory distress: Secondary | ICD-10-CM

## 2016-10-23 DIAGNOSIS — K838 Other specified diseases of biliary tract: Secondary | ICD-10-CM

## 2016-10-23 DIAGNOSIS — R6510 Systemic inflammatory response syndrome (SIRS) of non-infectious origin without acute organ dysfunction: Secondary | ICD-10-CM

## 2016-10-23 DIAGNOSIS — J9600 Acute respiratory failure, unspecified whether with hypoxia or hypercapnia: Secondary | ICD-10-CM

## 2016-10-23 LAB — COMPREHENSIVE METABOLIC PANEL
ALT: 58 U/L (ref 17–63)
AST: 187 U/L — AB (ref 15–41)
Albumin: 3.6 g/dL (ref 3.5–5.0)
Alkaline Phosphatase: 70 U/L (ref 38–126)
Anion gap: 18 — ABNORMAL HIGH (ref 5–15)
BILIRUBIN TOTAL: 1.8 mg/dL — AB (ref 0.3–1.2)
BUN: 21 mg/dL — AB (ref 6–20)
CHLORIDE: 112 mmol/L — AB (ref 101–111)
CO2: 13 mmol/L — ABNORMAL LOW (ref 22–32)
CREATININE: 2.1 mg/dL — AB (ref 0.61–1.24)
Calcium: 9.4 mg/dL (ref 8.9–10.3)
GFR calc Af Amer: 35 mL/min — ABNORMAL LOW (ref >60)
GFR calc non Af Amer: 30 mL/min — ABNORMAL LOW (ref >60)
Glucose, Bld: 164 mg/dL — ABNORMAL HIGH (ref 65–99)
Potassium: 4.6 mmol/L (ref 3.5–5.1)
SODIUM: 143 mmol/L (ref 135–145)
Total Protein: 7.8 g/dL (ref 6.5–8.1)

## 2016-10-23 LAB — TROPONIN I: TROPONIN I: 0.12 ng/mL — AB (ref <0.03)

## 2016-10-23 LAB — BLOOD GAS, ARTERIAL
Acid-base deficit: 11 mmol/L — ABNORMAL HIGH (ref 0.0–2.0)
BICARBONATE: 13.2 mmol/L — AB (ref 20.0–28.0)
DRAWN BY: 51155
O2 Content: 2 L/min
O2 Saturation: 96.3 %
PH ART: 7.379 (ref 7.350–7.450)
Patient temperature: 98.6
pCO2 arterial: 22.8 mmHg — ABNORMAL LOW (ref 32.0–48.0)
pO2, Arterial: 88.5 mmHg (ref 83.0–108.0)

## 2016-10-23 LAB — URINALYSIS, ROUTINE W REFLEX MICROSCOPIC
BILIRUBIN URINE: NEGATIVE
Bacteria, UA: NONE SEEN
GLUCOSE, UA: NEGATIVE mg/dL
KETONES UR: 20 mg/dL — AB
Leukocytes, UA: NEGATIVE
Nitrite: NEGATIVE
PROTEIN: 30 mg/dL — AB
RBC / HPF: NONE SEEN RBC/hpf (ref 0–5)
Specific Gravity, Urine: 1.015 (ref 1.005–1.030)
WBC UA: NONE SEEN WBC/hpf (ref 0–5)
pH: 5 (ref 5.0–8.0)

## 2016-10-23 LAB — CBC WITH DIFFERENTIAL/PLATELET
Basophils Absolute: 0 10*3/uL (ref 0.0–0.1)
Basophils Relative: 0 %
EOS PCT: 0 %
Eosinophils Absolute: 0 10*3/uL (ref 0.0–0.7)
HCT: 55.5 % — ABNORMAL HIGH (ref 39.0–52.0)
Hemoglobin: 18.1 g/dL — ABNORMAL HIGH (ref 13.0–17.0)
LYMPHS ABS: 0.9 10*3/uL (ref 0.7–4.0)
Lymphocytes Relative: 11 %
MCH: 29.2 pg (ref 26.0–34.0)
MCHC: 32.6 g/dL (ref 30.0–36.0)
MCV: 89.5 fL (ref 78.0–100.0)
MONOS PCT: 1 %
Monocytes Absolute: 0.1 10*3/uL (ref 0.1–1.0)
Neutro Abs: 7.1 10*3/uL (ref 1.7–7.7)
Neutrophils Relative %: 88 %
Platelets: 91 10*3/uL — ABNORMAL LOW (ref 150–400)
RBC: 6.2 MIL/uL — AB (ref 4.22–5.81)
RDW: 14.4 % (ref 11.5–15.5)
WBC: 8.1 10*3/uL (ref 4.0–10.5)

## 2016-10-23 LAB — GLUCOSE, CAPILLARY: Glucose-Capillary: 197 mg/dL — ABNORMAL HIGH (ref 65–99)

## 2016-10-23 LAB — BASIC METABOLIC PANEL
Anion gap: 15 (ref 5–15)
BUN: 28 mg/dL — AB (ref 6–20)
CHLORIDE: 112 mmol/L — AB (ref 101–111)
CO2: 17 mmol/L — AB (ref 22–32)
Calcium: 9.2 mg/dL (ref 8.9–10.3)
Creatinine, Ser: 2.18 mg/dL — ABNORMAL HIGH (ref 0.61–1.24)
GFR calc Af Amer: 33 mL/min — ABNORMAL LOW (ref >60)
GFR calc non Af Amer: 28 mL/min — ABNORMAL LOW (ref >60)
GLUCOSE: 241 mg/dL — AB (ref 65–99)
POTASSIUM: 4.4 mmol/L (ref 3.5–5.1)
Sodium: 144 mmol/L (ref 135–145)

## 2016-10-23 LAB — PHOSPHORUS: Phosphorus: 3.6 mg/dL (ref 2.5–4.6)

## 2016-10-23 LAB — MRSA PCR SCREENING: MRSA BY PCR: NEGATIVE

## 2016-10-23 LAB — CK TOTAL AND CKMB (NOT AT ARMC)
CK, MB: 6.6 ng/mL — ABNORMAL HIGH (ref 0.5–5.0)
Relative Index: 0.1 (ref 0.0–2.5)
Total CK: 6006 U/L — ABNORMAL HIGH (ref 49–397)

## 2016-10-23 LAB — MAGNESIUM: MAGNESIUM: 2.1 mg/dL (ref 1.7–2.4)

## 2016-10-23 LAB — SALICYLATE LEVEL: Salicylate Lvl: <7 mg/dL (ref 2.8–30.0)

## 2016-10-23 LAB — ETHANOL: Alcohol, Ethyl (B): <10 mg/dL (ref <10)

## 2016-10-23 MED ORDER — SODIUM CHLORIDE 0.9 % IV SOLN
750.0000 mg | Freq: Two times a day (BID) | INTRAVENOUS | Status: DC
  Administered 2016-10-23 – 2016-11-06 (×28): 750 mg via INTRAVENOUS
  Filled 2016-10-23 (×28): qty 7.5

## 2016-10-23 MED ORDER — TACROLIMUS 1 MG PO CAPS
4.0000 mg | ORAL_CAPSULE | Freq: Two times a day (BID) | ORAL | Status: DC
  Administered 2016-10-25 – 2016-10-26 (×3): 4 mg via SUBLINGUAL
  Filled 2016-10-23 (×7): qty 4

## 2016-10-23 MED ORDER — SODIUM BICARBONATE 8.4 % IV SOLN
INTRAVENOUS | Status: DC
  Administered 2016-10-23 – 2016-10-24 (×2): via INTRAVENOUS
  Filled 2016-10-23 (×3): qty 850

## 2016-10-23 MED ORDER — PIPERACILLIN-TAZOBACTAM 3.375 G IVPB
3.3750 g | Freq: Three times a day (TID) | INTRAVENOUS | Status: DC
  Administered 2016-10-23 – 2016-10-25 (×7): 3.375 g via INTRAVENOUS
  Filled 2016-10-23 (×8): qty 50

## 2016-10-23 MED ORDER — MYCOPHENOLATE MOFETIL HCL 500 MG IV SOLR
250.0000 mg | Freq: Two times a day (BID) | INTRAVENOUS | Status: DC
  Administered 2016-10-23 – 2016-10-31 (×16): 250 mg via INTRAVENOUS
  Filled 2016-10-23 (×23): qty 7.5

## 2016-10-23 MED ORDER — SODIUM BICARBONATE 8.4 % IV SOLN
50.0000 meq | Freq: Once | INTRAVENOUS | Status: AC
  Administered 2016-10-23: 50 meq via INTRAVENOUS
  Filled 2016-10-23: qty 50

## 2016-10-23 MED ORDER — INFLUENZA VAC SPLIT HIGH-DOSE 0.5 ML IM SUSY
0.5000 mL | PREFILLED_SYRINGE | INTRAMUSCULAR | Status: DC
  Filled 2016-10-23: qty 0.5

## 2016-10-23 MED ORDER — SODIUM CHLORIDE 0.9 % IV SOLN
200.0000 mg | Freq: Two times a day (BID) | INTRAVENOUS | Status: DC
  Administered 2016-10-24 – 2016-11-14 (×44): 200 mg via INTRAVENOUS
  Filled 2016-10-23 (×55): qty 20

## 2016-10-23 MED ORDER — LEVALBUTEROL HCL 0.63 MG/3ML IN NEBU
0.6300 mg | INHALATION_SOLUTION | Freq: Four times a day (QID) | RESPIRATORY_TRACT | Status: DC
  Administered 2016-10-23 (×3): 0.63 mg via RESPIRATORY_TRACT
  Filled 2016-10-23 (×3): qty 3

## 2016-10-23 MED ORDER — SODIUM CHLORIDE 0.9 % IV SOLN
200.0000 mg | Freq: Once | INTRAVENOUS | Status: AC
  Administered 2016-10-23: 200 mg via INTRAVENOUS
  Filled 2016-10-23: qty 20

## 2016-10-23 MED ORDER — LACOSAMIDE 50 MG PO TABS: 100.0000 mg | ORAL_TABLET | Freq: Two times a day (BID) | ORAL | Status: DC

## 2016-10-23 MED ORDER — TACROLIMUS 1 MG PO CAPS
4.0000 mg | ORAL_CAPSULE | Freq: Two times a day (BID) | ORAL | Status: DC
  Administered 2016-10-23 – 2016-10-24 (×3): 4 mg via ORAL
  Filled 2016-10-23 (×8): qty 4

## 2016-10-23 MED ORDER — IPRATROPIUM BROMIDE 0.02 % IN SOLN
0.5000 mg | Freq: Four times a day (QID) | RESPIRATORY_TRACT | Status: DC
  Administered 2016-10-23 (×3): 0.5 mg via RESPIRATORY_TRACT
  Filled 2016-10-23 (×3): qty 2.5

## 2016-10-23 MED ORDER — TACROLIMUS 5 MG/ML IV SOLN
0.0300 mg/kg/d | INTRAVENOUS | Status: DC
  Filled 2016-10-23: qty 1

## 2016-10-23 NOTE — Progress Notes (Signed)
PROGRESS NOTE    ATHENS LEBEAU  EGB:[id number] DOB: Apr 07, 1945 DOA: 10/03/2016 PCP: [name], MD   Brief Narrative:  Roger Gibson is a 71 y.o. male with medical history significant for hypertension, chronic combined systolic/diastolic CHF, history of right kidney cancer, end-stage renal disease status post renal transplantation, that presented to the emergency department with left-sided weakness. History is obtained from the patient's wife at the bedside. He had reportedly been in his usual state last night, driving them to dinner and back home. He went to bed as usual around 11 PM, was noted to sleep in which is not unusual for him, but then called out to his wife at approximately 1 PM, reporting difficulty getting out of bed. She had noted earlier that he was sleeping with his left leg hanging off the bed, and she was not strong enough to help stand him up. A neighbor came to help get the patient up, at which point he was noted to have dried blood caked about his mouth, and EMS was called. Patient complained of some abdominal discomfort with EMS, but had not been expressing any recent complaints aside from some minor knee pain.  Upon arrival to the ED, patient is found to be afebrile, saturating well on room air, slightly tachypneic, and with vitals otherwise stable. Noncontrast head CT reveals a small right frontal lobe abnormality of uncertain etiology. This was followed by MRI brain which was aborted early due to patient intolerance, but notable for abnormal signal at the right superior frontal gyrus consistent with vasogenic edema. Neurology was consulted and patient has undergone EEG. He was treated with a liter of normal saline in the ED. He was admitted to the telemetry unit for ongoing evaluation and management of acute neurologic deficits with vasogenic edema noted on a limited MRI and suspected Seizure. Neurology placed patient on Antiepileptics. Troponins increased and Discussed  Case with Cardiology who recommended to obtain an ECHO and outpatient stress test. Currently still don't know what frontal mass with vasogenic edema and is being worked up.   Patient decompensated overnight and was transferred to SDU and placed on BiPAP. Remained on BiPAP all day today as he remained lethargic. 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 Neurology planning on doing Overnight EEG. I discussed the case with Neurosurgery PA, and patient was placed on their list to see. Will need MRI and may need it under sedation.   Assessment & Plan:   Principal Problem:   Acute focal neurological deficit Active Problems:   Hypokalemia   History of renal transplant   Immunosuppressed status (HCC)   Acute kidney injury ([place])   CKD (chronic kidney disease), stage III (HCC)   Elevated troponin   Vasogenic brain edema (HCC)   Acute encephalopathy   Chronic combined systolic and diastolic CHF (congestive heart failure) (HCC)   Metabolic acidosis   Acute respiratory failure (HCC)   SIRS (systemic inflammatory response syndrome) (HCC)   Pneumobilia  1. Acute neurologic event suspect Seizure with Todd's Paralysis in the setting of an ill-defined hypodensity in the Frontal Lobe with slight vasogenic brain edema  - Pt presented with left-sided weakness after he was last seen normal at 23:00 the night before  - Dried blood about the mouth raised suspicion for seizure and EEG was performed,  - EEG recorded evidence of orbital dysfunction in the right posterior quadrant, suggestive of irritability. No seizure was recorded - CT Code Stroke with Small  RIGHT frontal lobe infarct versus venous hypertension and cortical vein thrombosis versus metastasis.Moderate atrophy and mild chronic small vessel ischemic disease;  - MRI limited as was aborted early d/t pt intolerance, but revealed abnormal signal at the right superior frontal gyrus  corresponding to the CT finding, but compatible with vasogenic edema rather than acute or subacute infarct. - MRA done but showed Severely motion degraded examination without internal carotid artery or basilar artery occlusion. Nondiagnostic for assessment of emergent large vessel occlusion. - Head CT ordered by Neurology this Afternoon showed Ill-defined hypodensity in the high right frontal lobe unchanged. The MRI was not diagnostic. This could represent edema related to infarct or tumor. There is mild mass-effect and effacement of the sulci.  - I discussed the Case with Neurology Dr. Leonel Ramsay personally and he is going to be ordering steroids for suspected vasogenic edema - Neurology ordered IV Fosphenytoin 1500 mg and IV Keppra 1000 mg q12h and was on Phenytoin; Phenytoin changed to Lacosamide 200 mg IV q12h due to interaction with Anti-Rejection Meds - Neurology consulted will follow-up on recommendations  - Unable to MRI/MRA as patient decompensated and ended up on BiPAP - Continue seizure precautions, continue frequent neuro checks, supportive care  - SLP done and recommending Dysphagia 1 Puree Diet with Thin Liquids - Per my discussion with Dr. Tobias Alexander will need to rule out Metastasis and recommended ordering CT Chest/Abd/Pelvis -CT Chest/Abd/Pelvis done and showed no evidence of metastatic disease -Checking UDS -Neurology ordering Overnight EEG monitoring for the patient.    2. ESRD s/p Renal Transplantation - SCr is 2.29 on admission, up from 1.5 in August 2018  - He has Hx of right renal cancer and ESRD, now with transplant kidney  - Nephrology Dr. Moshe Cipro consulted for further evaluation and Recc's - Low-dose Prednisone 5 mg po Daily, Mycophenolate 180 mg po BID, and Tacrolimus 4 mg po BID changed to IV Forms but don't have IV Tacrolimus so will give Sublingually  -CT Scan showed Transplanted left kidney with mild ectasia of the intrarenal collecting system. Mild soft tissue  edema and induration, nonspecific of the overlying left lower quadrant soft tissues and flank. Surgically absent native kidneys.  3. Acute kidney superimposed on CKD stage III  - SCr is 2.29 on admission, up from 1.5 in August 2018  - He was given a liter of NS in ED and was continued on IVF hydration overnight; IVF Changed to Bicarb gtt - BUN/Cr went from 23/2.29 -> 18/1.80 -> 28/2.18 - Continue immunosuppressants as above - Obtain CT Chest/Abd/Pelvis w/o Contrast as below - Renal U/S showed Left nephrectomy. Right kidney is not identified and is likely surgically absent. Hydronephrosis of the transplant kidney in the left renal fossa - U/A appeared to have no infection; Urine Cx pending  - Repeat CMP in AM  4. Hypokalemia  - Serum potassium 3.3 on admission; Improved to 4.4 - Replaced conservatively in light of kidney disease, treated with 20 mEq oral supplementation - Repeat CMP in AM  5. Chronic Combined Systolic/Diastolic CHF  - Pt appeared hypovolemic on arrival and was treated with 1 L NS in ED  - Hold Lasix initially,  - Follow strict I/O's and daily wts,  - Resume beta-blocker as tolerated  - C/w IVF rehydration with BiCarb Gtt   6. Hypertension  - BP at goal in ED, but increased today   - Managed at home with labetalol - Will use prn labetalol IVP's until passes swallow screen    7.  Elevated Troponin - Troponin elevated to 0.10 on admission and increased to 0.22 along with CK which was 12,724; Repeat Trended down - No anginal complaints to suggest ACS  - Plan to continue cardiac monitoring - Ordered ECHOCardiogram - Discussed Case with Cardiologist Dr. Oval Linsey who evaluated patient's EKG and recommends not to continue to trend troponin and recommends outpatient Stress Test and Evaluation - Patient scheduled for follow at D/C with Cardiologist   8. Metabolic Acidosis -Unclear Etiology -C/w Bicarb gtt -Repeat CMP in AM  9. Acute Respiratory Failure requiring  NIPPV -Patient decompensated overnight requiring BiPAP; ? Related to Seizures vs other etiology  -ABG looked ok and showed 7.379/22.8/88.5/11.0/96.3% -CT Scan showed Centrilobular and paraseptal emphysema of the lungs without dominant mass. Mild pleural thickening and calcific plaque along the right lung base is stable -Attempt to Wean BiPAP -Repeat CXR in AM  10. SIRS -Patient was febrile, Tachycardic, Tachypenic  -Started Empiric Abx with Zosyn -Follow Cx Data -Ordered Urinalysis  11. Mild Intrahepatic/Extrahepatic Pneumobilia -? Related to Gall stone passing; No Abdominal Pain -Will discuss with General Surgery  DVT prophylaxis: SCDs Code Status: FULL CODE Family Communication: Discussed with wife at bedside Disposition Plan: Remain Inpatient  Consultants:   Neurology Dr. Leonel Ramsay  Discussed Case with Cardiologist Dr. Oval Linsey who recommended outpatient ischemic evaluation/stress test  Nephrology Dr. Corliss Parish  Neurosurgery notified   Procedures:  ECHOCARDIOGRAM  EEG EEG Abnormalities: 1) lateralized periodic discharges in the right posterior quadrant with delta wave morphology 2) utilize irregular slow activity  Clinical Interpretation: This EEG recorded evidence of orbital dysfunction in the right posterior quadrant, suggestive of irritability. No seizure was recorded.   Antimicrobials: Anti-infectives    Start     Dose/Rate Route Frequency Ordered Stop   10/23/16 0800  piperacillin-tazobactam (ZOSYN) IVPB 3.375 g     3.375 g 12.5 mL/hr over 240 Minutes Intravenous Every 8 hours 10/23/16 0735       Subjective: Seen and examined at bedside and was more alert this AM wearing BiPAP but still very lethargic. No complaints of pain. No other concerns and no family present at bedside.   Objective: Vitals:   10/23/16 1500 10/23/16 1504 10/23/16 1919 10/23/16 2000  BP: (!) 162/89  (!) 172/120 (!) 184/105  Pulse:   98 95  Resp: 20  (!) 32 (!) 21    Temp: 99.8 F (37.7 C)   99.7 F (37.6 C)  TempSrc: Axillary   Axillary  SpO2: 100% 98% 100% 99%  Weight:      Height:        Intake/Output Summary (Last 24 hours) at 10/23/16 2038 Last data filed at 10/23/16 1558  Gross per 24 hour  Intake                0 ml  Output              375 ml  Net             -375 ml   Filed Weights   10/23/16 0500  Weight: 90 kg (198 lb 6.6 oz)    Examination: Physical Exam:  Constitutional: WN/WD AAM wearing BiPAP in NAD and is a little lethargic  Eyes: Sclerae anicteric. Lids normal ENMT: External ears and nose appear normal. Grossly normal hearing Neck: Supple with no JVD Respiratory: Diminished but no appreciable wheezing/rales/rhonchi. Wearing BiPAP Cardiovascular: RRR; Mild extremity edema Abdomen: Soft. NT, Distended due to body habitus. Bowel sounds present GU: Deferred Musculoskeletal: No contractures; No cyanosis  Skin: Warm and Dry. No appreciable rashes Neurologic: CN 2-12 grossly intact. No appreciable focal deficits Psychiatric: Impaired judgement and insight. Somnolent but arousable. A little confused  Data Reviewed: I have personally reviewed following labs and imaging studies  CBC:  Recent Labs Lab 10/23/2016 1345 09/28/2016 1402 10/22/16 0638 10/23/16 0231  WBC 7.0  --  8.4 8.1  NEUTROABS 5.3  --   --  7.1  HGB 12.9* 13.9 14.3 18.1*  HCT 39.1 41.0 42.8 55.5*  MCV 87.9  --  89.0 89.5  PLT 158  --  140* 91*   Basic Metabolic Panel:  Recent Labs Lab 10/23/2016 1345 10/15/2016 1402 10/22/16 0638 10/22/16 0830 10/23/16 0231 10/23/16 1022  NA 141 142 142  --  143 144  K 3.3* 3.4* 3.8  --  4.6 4.4  CL 108 108 112*  --  112* 112*  CO2 22  --  14*  --  13* 17*  GLUCOSE 102* 100* 97  --  164* 241*  BUN 23* 27* 18  --  21* 28*  CREATININE 2.29* 2.10* 1.80*  --  2.10* 2.18*  CALCIUM 9.7  --  9.1  --  9.4 9.2  MG  --   --   --  1.9 2.1  --   PHOS  --   --   --  3.0 3.6  --    GFR: Estimated Creatinine Clearance:  34.6 mL/min (A) (by C-G formula based on SCr of 2.18 mg/dL (H)). Liver Function Tests:  Recent Labs Lab 10/15/2016 1345 10/22/16 0638 10/23/16 0231  AST 81* 209* 187*  ALT 26 48 58  ALKPHOS 67 63 70  BILITOT 1.1 1.7* 1.8*  PROT 7.1 6.8 7.8  ALBUMIN 4.0 3.7 3.6   No results for input(s): LIPASE, AMYLASE in the last 168 hours. No results for input(s): AMMONIA in the last 168 hours. Coagulation Profile:  Recent Labs Lab 09/30/2016 1345  INR 1.08   Cardiac Enzymes:  Recent Labs Lab 10/12/2016 1919 10/07/2016 2325 10/22/16 0638 10/23/16 1022  CKTOTAL 9,711*  --  12,724* 6,006*  CKMB  --   --   --  6.6*  TROPONINI 0.13* 0.17* 0.22* 0.12*   BNP (last 3 results) No results for input(s): PROBNP in the last 8760 hours. HbA1C: No results for input(s): HGBA1C in the last 72 hours. CBG:  Recent Labs Lab 10/01/2016 1620 10/22/16 0743 10/23/16 0755  GLUCAP 111* 81 197*   Lipid Profile: No results for input(s): CHOL, HDL, LDLCALC, TRIG, CHOLHDL, LDLDIRECT in the last 72 hours. Thyroid Function Tests: No results for input(s): TSH, T4TOTAL, FREET4, T3FREE, THYROIDAB in the last 72 hours. Anemia Panel: No results for input(s): VITAMINB12, FOLATE, FERRITIN, TIBC, IRON, RETICCTPCT in the last 72 hours. Sepsis Labs: No results for input(s): PROCALCITON, LATICACIDVEN in the last 168 hours.  Recent Results (from the past 240 hour(s))  MRSA PCR Screening     Status: None   Collection Time: 10/23/16  1:58 AM  Result Value Ref Range Status   MRSA by PCR NEGATIVE NEGATIVE Final    Comment:        The GeneXpert MRSA Assay (FDA approved for NASAL specimens only), is one component of a comprehensive MRSA colonization surveillance program. It is not intended to diagnose MRSA infection nor to guide or monitor treatment for MRSA infections.      Radiology Studies: Ct Abdomen Pelvis Wo Contrast  Result Date: 10/22/2016 CLINICAL DATA:  Renal cell cancer, screening for metastasis.  Suspected mass  the frontal lobe. EXAM: CT CHEST, ABDOMEN AND PELVIS WITHOUT CONTRAST TECHNIQUE: Multidetector CT imaging of the chest, abdomen and pelvis was performed following the standard protocol without IV contrast. COMPARISON:  CT abdomen and pelvis from 12/04/2010. FINDINGS: CT CHEST FINDINGS Cardiovascular: Cardiomegaly without pericardial effusion. There is coronary arteriosclerosis along the circumflex. Mild-to-moderate atherosclerosis of the thoracic aorta without aneurysm. Left subclavian vein stent is noted within the superior mediastinum. Mediastinum/Nodes: No enlarged mediastinal, hilar, or axillary lymph nodes. Thyroid gland, trachea, and esophagus demonstrate no significant findings. Lungs/Pleura: Calcified pleural plaque along the medial aspect of the right hemithorax. Bibasilar dependent and subsegmental atelectasis. No dominant mass. Centrilobular and paraseptal emphysema of the lungs. Musculoskeletal: No aggressive appearing osseous lesions. CT ABDOMEN PELVIS FINDINGS Hepatobiliary: Motion artifacts limit assessment in the upper abdomen. Distended gallbladder containing an air-fluid level within the gallbladder. Trace pneumobilia is also noted in the left hepatic lobe and common bile duct. This in conjunction with small layering stones within the gallbladder could be secondary to passage of a gallstone or instrumentation of the common bile duct. No wall thickening is identified. No definite mass given limitations of a noncontrast study. Pancreas: Unremarkable. No pancreatic ductal dilatation or surrounding inflammatory changes. Spleen: Normal in size without focal abnormality. Adrenals/Urinary Tract: Status post left nephrectomy. The right kidney is likewise not identified but no surgical clips are seen in the retroperitoneum. Transplanted left pelvic kidney with mild ectasia of the intrarenal collecting system. Contracted bladder with resultant mild mural thickening. Stomach/Bowel: Contracted  stomach. Normal small bowel rotation. No small bowel dilatation or obstruction. Normal appendix. Moderate amount of stool is seen within the colon in particular the rectosigmoid. Vascular/Lymphatic: Aortoiliac and branch vessel atherosclerosis. No aneurysm. Mild enlarged abdominal or pelvic lymph nodes. Reproductive: Normal size prostate. Other: .No free air nor free fluid. Nonspecific soft tissue induration and edema involving the left oblique muscles and overlying subcutaneous soft tissues. Musculoskeletal: No aggressive appearing osseous lesions. IMPRESSION: 1. No findings strongly suspicious for metastatic disease. 2. Centrilobular and paraseptal emphysema of the lungs without dominant mass. Mild pleural thickening and calcific plaque along the right lung base is stable. 3. Air-fluid level within the gallbladder with gallstones and mild intrahepatic and extrahepatic pneumobilia. Findings may represent passage of a gallstone or prior biliary instrumentation. 4. Transplanted left kidney with mild ectasia of the intrarenal collecting system. Mild soft tissue edema and induration, nonspecific of the overlying left lower quadrant soft tissues and flank. 5. Surgically absent native kidneys. Electronically Signed   By: Ashley Royalty M.D.   On: 10/22/2016 23:15   Dg Abd 1 View  Result Date: 09/28/2016 CLINICAL DATA:  Abdominal pain EXAM: ABDOMEN - 1 VIEW COMPARISON:  12/04/2010 CT FINDINGS: Right lung base is clear. Multiple surgical clips in the left upper abdomen. Nonobstructed gas pattern with moderate upper quadrant stool. Clips in the right lower quadrant. Calcified pelvic phleboliths. IMPRESSION: Nonobstructed bowel-gas pattern. Electronically Signed   By: Donavan Foil M.D.   On: 09/28/2016 21:16   Ct Head Wo Contrast  Result Date: 10/22/2016 CLINICAL DATA:  Left-sided weakness.  Focal neuro deficit EXAM: CT HEAD WITHOUT CONTRAST TECHNIQUE: Contiguous axial images were obtained from the base of the skull  through the vertex without intravenous contrast. COMPARISON:  MRI and CT 10/19/2016 FINDINGS: Brain: Ill-defined hypodensity in the right frontal cortex over the convexity unchanged from prior CT. There appears to be effacement of sulci in this area. MRI was indeterminate and incomplete. Negative for acute hemorrhage. Generalized atrophy with chronic  ischemic change in the white matter. Vascular: Negative for hyperdense vessel Skull: Negative Sinuses/Orbits: Mild mucosal edema paranasal sinuses. Negative orbit Other: None IMPRESSION: Ill-defined hypodensity in the high right frontal lobe unchanged. The MRI was not diagnostic. This could represent edema related to infarct or tumor. There is mild mass-effect and effacement of the sulci. Repeat MRI is suggested when the patient is able to complete the study. If MRI is not possible, CT without and with contrast suggested to evaluate for enhancing mass Atrophy with chronic microvascular ischemia. Electronically Signed   By: Franchot Gallo M.D.   On: 10/22/2016 13:32   Ct Chest Wo Contrast  Result Date: 10/22/2016 CLINICAL DATA:  Renal cell cancer, screening for metastasis. Suspected mass the frontal lobe. EXAM: CT CHEST, ABDOMEN AND PELVIS WITHOUT CONTRAST TECHNIQUE: Multidetector CT imaging of the chest, abdomen and pelvis was performed following the standard protocol without IV contrast. COMPARISON:  CT abdomen and pelvis from 12/04/2010. FINDINGS: CT CHEST FINDINGS Cardiovascular: Cardiomegaly without pericardial effusion. There is coronary arteriosclerosis along the circumflex. Mild-to-moderate atherosclerosis of the thoracic aorta without aneurysm. Left subclavian vein stent is noted within the superior mediastinum. Mediastinum/Nodes: No enlarged mediastinal, hilar, or axillary lymph nodes. Thyroid gland, trachea, and esophagus demonstrate no significant findings. Lungs/Pleura: Calcified pleural plaque along the medial aspect of the right hemithorax. Bibasilar  dependent and subsegmental atelectasis. No dominant mass. Centrilobular and paraseptal emphysema of the lungs. Musculoskeletal: No aggressive appearing osseous lesions. CT ABDOMEN PELVIS FINDINGS Hepatobiliary: Motion artifacts limit assessment in the upper abdomen. Distended gallbladder containing an air-fluid level within the gallbladder. Trace pneumobilia is also noted in the left hepatic lobe and common bile duct. This in conjunction with small layering stones within the gallbladder could be secondary to passage of a gallstone or instrumentation of the common bile duct. No wall thickening is identified. No definite mass given limitations of a noncontrast study. Pancreas: Unremarkable. No pancreatic ductal dilatation or surrounding inflammatory changes. Spleen: Normal in size without focal abnormality. Adrenals/Urinary Tract: Status post left nephrectomy. The right kidney is likewise not identified but no surgical clips are seen in the retroperitoneum. Transplanted left pelvic kidney with mild ectasia of the intrarenal collecting system. Contracted bladder with resultant mild mural thickening. Stomach/Bowel: Contracted stomach. Normal small bowel rotation. No small bowel dilatation or obstruction. Normal appendix. Moderate amount of stool is seen within the colon in particular the rectosigmoid. Vascular/Lymphatic: Aortoiliac and branch vessel atherosclerosis. No aneurysm. Mild enlarged abdominal or pelvic lymph nodes. Reproductive: Normal size prostate. Other: .No free air nor free fluid. Nonspecific soft tissue induration and edema involving the left oblique muscles and overlying subcutaneous soft tissues. Musculoskeletal: No aggressive appearing osseous lesions. IMPRESSION: 1. No findings strongly suspicious for metastatic disease. 2. Centrilobular and paraseptal emphysema of the lungs without dominant mass. Mild pleural thickening and calcific plaque along the right lung base is stable. 3. Air-fluid level  within the gallbladder with gallstones and mild intrahepatic and extrahepatic pneumobilia. Findings may represent passage of a gallstone or prior biliary instrumentation. 4. Transplanted left kidney with mild ectasia of the intrarenal collecting system. Mild soft tissue edema and induration, nonspecific of the overlying left lower quadrant soft tissues and flank. 5. Surgically absent native kidneys. Electronically Signed   By: Ashley Royalty M.D.   On: 10/22/2016 23:15   Mr Jodene Nam Head Wo Contrast  Result Date: 10/07/2016 CLINICAL DATA:  Seizure like activity, fatigue. EXAM: MRA HEAD WITHOUT CONTRAST TECHNIQUE: Angiographic images of the Circle of Willis were obtained using MRA  technique without intravenous contrast. COMPARISON:  MRI of the head October 21, 2016 at 1445 hours FINDINGS: Severely motion degraded examination. ANTERIOR CIRCULATION: Flow related enhancement internal carotid artery's, proximal MCA and possibly ACA vessels. POSTERIOR CIRCULATION: Flow related enhancement within RIGHT and possibly LEFT vertebral artery, basilar artery and proximal posterior cerebral artery's. Source images and MIP images were reviewed. IMPRESSION: Severely motion degraded examination without internal carotid artery or basilar artery occlusion. Nondiagnostic for assessment of emergent large vessel occlusion. Electronically Signed   By: Elon Alas M.D.   On: 10/18/2016 21:02   US Renal  Result Date: 10/23/2016 CLINICAL DATA:  Acute kidney injury. History of left nephrectomy with left renal transplant. EXAM: RENAL / URINARY TRACT ULTRASOUND COMPLETE COMPARISON:  CT of the abdomen and pelvis on 10/22/2016 FINDINGS: Right Kidney: Length: Not identified. Suspect right native kidney is surgically absent. Technologist reports a large right flank scar. Left Kidney: Length: Surgically absent native left kidney. Left renal transplant: Length:  12.4 cm.  Normal renal echogenicity.  Mild hydronephrosis. Bladder: The bladder  is decompressed. Study quality is degraded because the patient is unable to suspend respirations for the exam due to BiPAP. IMPRESSION: 1. Left nephrectomy. 2. Right kidney is not identified and is likely surgically absent. 3. Hydronephrosis of the transplant kidney in the left renal fossa. Electronically Signed   By: Nolon Nations M.D.   On: 10/23/2016 18:30   Dg Chest Port 1 View  Result Date: 10/23/2016 CLINICAL DATA:  Acute respiratory distress EXAM: PORTABLE CHEST 1 VIEW COMPARISON:  10/03/2016 CXR, chest CT from 10/22/2016 FINDINGS: Stable cardiomegaly with aortic atherosclerosis. Vascular stent projects over the superior mediastinum along the expected location of the left subclavian vein by same day CT. Bibasilar atelectasis is noted left-greater-than-right. Small left effusion. No overt pulmonary edema. No acute nor suspicious osseous abnormality. IMPRESSION: 1. Bibasilar atelectasis left greater than right with small left pleural effusion. No overt pulmonary edema. 2. Stable cardiomegaly with aortic atherosclerosis. Electronically Signed   By: Ashley Royalty M.D.   On: 10/23/2016 01:42   Scheduled Meds: . cholecalciferol  2,000 Units Oral Daily  . dexamethasone  4 mg Intravenous Q6H  . [START ON 11/23/2016] Influenza vac split quadrivalent PF  0.5 mL Intramuscular Tomorrow-1000  . ipratropium  0.5 mg Nebulization Q6H  . levalbuterol  0.63 mg Nebulization Q6H  . pravastatin  20 mg Oral q1800  . sodium chloride flush  3 mL Intravenous Q12H  . tacrolimus  4 mg Oral BID   Or  . tacrolimus  4 mg Sublingual BID  . tamsulosin  0.4 mg Oral QPC supper   Continuous Infusions: . lacosamide (VIMPAT) IV    . levETIRAcetam    . mycophenolate (CELLCEPT) IV    . piperacillin-tazobactam (ZOSYN)  IV 3.375 g (10/23/16 2021)  .  sodium bicarbonate (isotonic) infusion in sterile water 75 mL/hr at 10/23/16 1152    LOS: 2 days   Kerney Elbe, DO [facility] Pager [phone]  If  7PM-7AM, please contact night-coverage [email or web address] Password [facility] 10/23/2016, 8:38 PM

## 2016-10-23 NOTE — Significant Event (Signed)
Rapid Response Event Note  Overview:Called to bedside d/t pt RR-40s Time Called: 0011 Arrival Time: 0015 Event Type: Respiratory  Initial Focused Assessment: Pt laying in bed, responds when name called but goes back to sleep immediately. Pt will not follow commands. Pupils 3 and brisk. Lungs with shallow respirations, accessory muscle use, and diminished lung sounds bilaterally. T-99.1(O), HR-118, BP-156/95, RR-44, SpO2-99% on 2L Jeff. Interventions: ABG-7.37, CO2-22.8, PO2-88.5, bicarb-13.2.  PCXR pending. Plan of Care (if not transferred): Will transfer pt to SDU for bipap. Please call RRT if further assistance needed. Event Summary: Name of Physician Notified: Walden Field, NP (PTA RRT) at      at    Outcome: Transferred (Comment) (SDU)     Dillard Essex

## 2016-10-23 NOTE — Progress Notes (Signed)
MEDICATION RELATED CONSULT NOTE - INITIAL   Pharmacy Consult to transition anti-rejection meds to IV formulation.  Patient currently prescribed:  myfortic 180mg  po bid prior to admit - equivalent IV dose is mycophenolate 250mg  IV q12 hours   Tacrolimus 4mg  po bid (8mg  daily total) prior to admit - PO to IV equivalency is approximately 3:1 and IV must be a continuous infusion over 24 hours  Will change tacrolimus to 0.03mg /kg/hr = 0.1125mg /hr = 2.7mg /day   Discussed and handed compatibility list to nurse. Additional IV access may be needed. Nephrology has ordered a tacrolimus level for 9/30 am.  Would change back to po meds as soon as able. Watch tacrolimus for infusion related reactions.  Pharmacy to continue to follow.  Erin Hearing PharmD., BCPS Clinical Pharmacist Pager [phone] 10/23/2016 3:09 PM

## 2016-10-23 NOTE — Progress Notes (Addendum)
Subjective: Had some problems of desaturation overnight,  Exam: Vitals:   10/23/16 1100 10/23/16 1154  BP: (!) 152/98   Pulse: 100   Resp: (!) 22 (!) 28  Temp: 99.1 F (37.3 C)   SpO2: 100% 99%   Gen: In bed, NAD Resp: non-labored breathing, no acute distress Abd: soft, nt  Neuro: MS: Awake, alert, answers questions and follows commands readily CN: Visual fields are full, extra Movements are intact Motor: Full strength on the right, on the left he is able to raise his left arm against gravity as well as his left leg, 3/5 strength but this does represent an improvement Sensory: He does not extinguish to double simultaneous stimulation  Pertinent Labs: Creatinine 2.1 GFR 33  Impression: 71 year old male presented with new onset seizures with persistent postictal left-sided hemiparesis. 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 Unfortunately with his renal function, I be has not to do contrast at this time. Also, I will decrease his Keppra to 750 mg twice a day  Recommendations: 1) decrease Keppra to 750 mg twice a day 2) phenytoin does react with prograf, will change to Vimpat 3) repeat EEG today 4) repeat MRI, ideally we would get this with contrast given his difficulty with getting imaging previously and his borderline renal function, I'm going to hold off for now.  Roland Rack, MD [facility] [phone]  If 7pm- 7am, please page neurology on call as listed in [place].

## 2016-10-23 NOTE — Progress Notes (Signed)
PT Cancellation Note  Patient Details Name: MERRILL VILLARRUEL MRN: [id number] DOB: 08/11/1945   Cancelled Treatment:    Reason Eval/Treat Not Completed: Medical issues which prohibited therapy (Pt on Bipap.  Nurse asked to [facility] PT.  will check back tomorrow as able or Monday.)Thanks.    Jonet Mathies F Justus Duerr 10/23/2016, 1:10 PM [facility] [phone] (pager)

## 2016-10-23 NOTE — Progress Notes (Signed)
OT Cancellation Note  Patient Details Name: Roger Gibson MRN: [id number] DOB: Aug 31, 1945   Cancelled Treatment:    Reason Eval/Treat Not Completed: Medical issues which prohibited therapy. Noted events overnight and discussed with RN who requests OT hold this date. Will check back as able to initiate OT evaluation.   Norman Herrlich, MS OTR/L  Pager: [place] [name] 10/23/2016, 1:05 PM

## 2016-10-23 NOTE — Progress Notes (Signed)
Pt off BiPAP. BP: 178/96, HR: 90, RR: 19. No distress. Will continue to monitor pt.

## 2016-10-23 NOTE — Consult Note (Signed)
Chief Complaint   Chief Complaint  Patient presents with  . Code Stroke    HPI   HPI: Roger Gibson is a 71 y.o. male who presented to ER as code stroke due to left sided weakness. Patient currently on bipap with no family present. History obtained via chart review. Called out to wife around 3pm 9/27 and when she went to see him, he was altered with crusted blood on lips. EMS called and brought to ER. Ct scan showed small right frontal lobe abnormality. Subsequently underwent emergent MRI but unable to tolerate so aborted. Limited view of MRI did show frontal mass with vasogenic edema.  Patient apparently decompensated yesterday requiring bipap. PMHx reviewed.  Patient Active Problem List   Diagnosis Date Noted  . Acute kidney injury ([place]) 10/20/2016  . CKD (chronic kidney disease), stage III ([place]) 10/20/2016  . Elevated troponin 10/01/2016  . Vasogenic brain edema ([place]) 10/19/2016  . Acute encephalopathy 10/13/2016  . Acute focal neurological deficit 09/28/2016  . Chronic combined systolic and diastolic CHF (congestive heart failure) ([place]) 10/02/2016  . CHF exacerbation ([place]) 05/14/2014  . Renal transplant recipient 05/14/2014  . Renal failure, acute ([place]) 05/14/2014  . History of renal transplant   . Immunosuppressed status ([place])   . Hyperkalemia 08/01/2012  . Pulmonary edema 08/01/2012  . Leg pain 08/16/2010  . MRSA (methicillin resistant staph aureus) culture positive 07/20/2010  . Renal cell carcinoma 06/13/2010  . Confusion 05/19/2010  . ESRD on hemodialysis ([place]) 05/19/2010  . 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 ALLERGY, ENVIRONMENTAL 06/18/2009  . MALAISE AND FATIGUE 04/01/2008  . Hypokalemia 06/15/2007  . HYPERLIPIDEMIA 12/26/2006  . GOUT 12/26/2006  . ANEMIA-IRON DEFICIENCY 12/26/2006  . Essential hypertension 12/26/2006  . HEMATURIA, HX OF 12/23/2006  . BENIGN PROSTATIC HYPERTROPHY, HX OF 12/23/2006  . PERS HX TOBACCO USE PRESENTING HAZARDS HEALTH 12/23/2006     PMH: Past Medical History:  Diagnosis Date  . Acute focal neurological deficit 09/2016  . Allergy   . Blood transfusion without reported diagnosis   . Cancer ([place])    right kidney ca  . Confusion    during admission to [facility] 2012, persisted after discharge  . ESRD (end stage renal disease) ([place])   . ESRD (end stage renal disease) on dialysis ([place])    T, Th, Sat HD in [place], [place]  . Gout 01/2002   Right knee; Left great toe  . HLD (hyperlipidemia) 1999  . HTN (hypertension) 1974  . Iron deficiency anemia    PRE 2004/06/2000  . Renal cysts, acquired, bilateral    per [facility]    PSH: Past Surgical History:  Procedure Laterality Date  . [place]  05/15/03   Polyp (biopsy negative) repeat in 5 years  . COLONOSCOPY  07/18/08   1 rectal polyp (Dr. Wynetta Emery) 5 years  . CT ABD W & PELVIS WO CM  03/03/06   Right kidney absent; Left with cystic changes  . CYSTOSCOPY  03/03/06   Mild to mod. BPH observed/ otherwise normal  . KIDNEY TRANSPLANT  2006-07-08   deceased donor-RLQ Abd, partial neph for renal cell CA/complete Nephrectomy for infection ([facility])  . MR MRA ABDOMEN  2000   3x3 mass left kidney  . NEPHRECTOMY  1965   Left (infection) [place]  . NEPHRECTOMY  2000   partial-(2nd to renal carcinoma) Dr. [place] [facility]    Prescriptions Prior to  Admission  Medication Sig Dispense Refill Last Dose  . aspirin EC 81 MG tablet Take 81 mg by mouth daily.   10/20/2016 at Unknown time  . Cholecalciferol 2000 units TABS Take 2,000 Units by mouth daily.   10/20/2016 at Unknown time  . furosemide (LASIX) 20 MG tablet Take 10 mg by mouth daily.    10/20/2016 at Unknown time  . labetalol (NORMODYNE) 100 MG tablet Take 400 mg by mouth 2 (two) times daily.    10/20/2016 at Unknown time  . Multiple Vitamin (MULTIVITAMIN WITH MINERALS) TABS tablet Take 1 tablet by mouth daily.   10/20/2016 at  Unknown time  . mycophenolate (MYFORTIC) 180 MG EC tablet Take 180 mg by mouth 2 (two) times daily.    10/20/2016 at Unknown time  . pravastatin (PRAVACHOL) 20 MG tablet Take 20 mg by mouth every evening.   10/20/2016 at Unknown time  . predniSONE (DELTASONE) 5 MG tablet Take 5 mg by mouth daily with breakfast.    10/20/2016 at Unknown time  . sodium bicarbonate 650 MG tablet Take 650 mg by mouth 2 (two) times daily.   10/20/2016 at Unknown time  . tacrolimus (PROGRAF) 1 MG capsule Take 4 mg by mouth 2 (two) times daily.    10/20/2016 at Unknown time  . tamsulosin (FLOMAX) 0.4 MG CAPS capsule Take 0.4 mg by mouth daily after supper.   10/20/2016 at Unknown time  . omeprazole (PRILOSEC) 20 MG capsule Take 20 mg by mouth daily.   10/15/2015  . sertraline (ZOLOFT) 50 MG tablet Take 50 mg by mouth daily.   10/15/2015    SH: Social History  Substance Use Topics  . Smoking status: Former Smoker    Types: Cigarettes, Cigars    Quit date: 01/25/1998  . Smokeless tobacco: Never Used     Comment: occasional cigar  . Alcohol use No     Comment: very rare    MEDS: Prior to Admission medications   Medication Sig Start Date End Date Taking? Authorizing Provider  aspirin EC 81 MG tablet Take 81 mg by mouth daily.   Yes [provider]  Cholecalciferol 2000 units TABS Take 2,000 Units by mouth daily.   Yes [provider]  furosemide (LASIX) 20 MG tablet Take 10 mg by mouth daily.    Yes [provider]  labetalol (NORMODYNE) 100 MG tablet Take 400 mg by mouth 2 (two) times daily.    Yes [provider]  Multiple Vitamin (MULTIVITAMIN WITH MINERALS) TABS tablet Take 1 tablet by mouth daily.   Yes [provider]  mycophenolate (MYFORTIC) 180 MG EC tablet Take 180 mg by mouth 2 (two) times daily.    Yes [provider]  pravastatin (PRAVACHOL) 20 MG tablet Take 20 mg by mouth every evening.   Yes [provider]  predniSONE (DELTASONE) 5 MG tablet  Take 5 mg by mouth daily with breakfast.    Yes [provider]  sodium bicarbonate 650 MG tablet Take 650 mg by mouth 2 (two) times daily.   Yes [provider]  tacrolimus (PROGRAF) 1 MG capsule Take 4 mg by mouth 2 (two) times daily.    Yes [provider]  tamsulosin (FLOMAX) 0.4 MG CAPS capsule Take 0.4 mg by mouth daily after supper.   Yes [provider]  omeprazole (PRILOSEC) 20 MG capsule Take 20 mg by mouth daily.    [provider]  sertraline (ZOLOFT) 50 MG tablet Take 50 mg by mouth  daily.    [provider]    ALLERGY: Allergies  Allergen Reactions  . Iodinated Diagnostic Agents Hives and Swelling  . Iodine Hives and Swelling  . Tape Rash    Adhesive tape     Social History  Substance Use Topics  . Smoking status: Former Smoker    Types: Cigarettes, Cigars    Quit date: 01/25/1998  . Smokeless tobacco: Never Used     Comment: occasional cigar  . Alcohol use No     Comment: very rare     Family History  Problem Relation Age of Onset  . Stomach cancer Father        ?  Marland Kitchen Hypertension Mother        On dialysis for years  . Hypertension Brother   . Hypertension Brother   . Hypertension Brother   . Hypertension Sister   . Breast cancer Sister        s/p mastectomy  . COPD Sister   . Hypertension Sister   . Kidney failure Sister        on dialysis  . Heart attack Paternal Grandfather   . Colon cancer Neg Hx   . Esophageal cancer Neg Hx   . Rectal cancer Neg Hx      ROS   ROS unable to obtain secondary to bipap  Exam   Vitals:   10/23/16 1504 10/23/16 1919  BP:  (!) 172/120  Pulse:  98  Resp:  (!) 32  Temp:    SpO2: 98% 100%   Currently on bipap. Moves upper extremities antigravity at elbow. Unable to test strength. Wiggles toes of lower exremities PERRL CN exam limited but grossly normal  Results - Imaging/Labs   Results for orders placed or performed during the hospital encounter of  09/28/2016 (from the past 48 hour(s))  Troponin I     Status: Abnormal   Collection Time: 10/02/2016 11:25 PM  Result Value Ref Range   Troponin I 0.17 (HH) <0.03 ng/mL    Comment: CRITICAL VALUE NOTED.  VALUE IS CONSISTENT WITH PREVIOUSLY REPORTED AND CALLED VALUE.  Comprehensive metabolic panel     Status: Abnormal   Collection Time: 10/22/16  6:38 AM  Result Value Ref Range   Sodium 142 135 - 145 mmol/L   Potassium 3.8 3.5 - 5.1 mmol/L   Chloride 112 (H) 101 - 111 mmol/L   CO2 14 (L) 22 - 32 mmol/L   Glucose, Bld 97 65 - 99 mg/dL   BUN 18 6 - 20 mg/dL   Creatinine, Ser 1.80 (H) 0.61 - 1.24 mg/dL   Calcium 9.1 8.9 - 10.3 mg/dL   Total Protein 6.8 6.5 - 8.1 g/dL   Albumin 3.7 3.5 - 5.0 g/dL   AST 209 (H) 15 - 41 U/L   ALT 48 17 - 63 U/L   Alkaline Phosphatase 63 38 - 126 U/L   Total Bilirubin 1.7 (H) 0.3 - 1.2 mg/dL   GFR calc non Af Amer 36 (L) >60 mL/min   GFR calc Af Amer 42 (L) >60 mL/min    Comment: (NOTE) The eGFR has been calculated using the CKD EPI equation. This calculation has not been validated in all clinical situations. eGFR's persistently <60 mL/min signify possible Chronic Kidney Disease.    Anion gap 16 (H) 5 - 15  CBC     Status: Abnormal   Collection Time: 10/22/16  6:38 AM  Result Value Ref Range   WBC 8.4 4.0 - 10.5 K/uL  RBC 4.81 4.22 - 5.81 MIL/uL   Hemoglobin 14.3 13.0 - 17.0 g/dL   HCT 42.8 39.0 - 52.0 %   MCV 89.0 78.0 - 100.0 fL   MCH 29.7 26.0 - 34.0 pg   MCHC 33.4 30.0 - 36.0 g/dL   RDW 14.1 11.5 - 15.5 %   Platelets 140 (L) 150 - 400 K/uL  Troponin I     Status: Abnormal   Collection Time: 10/22/16  6:38 AM  Result Value Ref Range   Troponin I 0.22 (HH) <0.03 ng/mL    Comment: CRITICAL VALUE NOTED.  VALUE IS CONSISTENT WITH PREVIOUSLY REPORTED AND CALLED VALUE.  CK     Status: Abnormal   Collection Time: 10/22/16  6:38 AM  Result Value Ref Range   Total CK 12,724 (H) 49 - 397 U/L    Comment: RESULTS CONFIRMED BY MANUAL DILUTION   Glucose, capillary     Status: None   Collection Time: 10/22/16  7:43 AM  Result Value Ref Range   Glucose-Capillary 81 65 - 99 mg/dL  Magnesium     Status: None   Collection Time: 10/22/16  8:30 AM  Result Value Ref Range   Magnesium 1.9 1.7 - 2.4 mg/dL  Phosphorus     Status: None   Collection Time: 10/22/16  8:30 AM  Result Value Ref Range   Phosphorus 3.0 2.5 - 4.6 mg/dL  Blood gas, arterial     Status: Abnormal   Collection Time: 10/23/16 12:20 AM  Result Value Ref Range   O2 Content 2.0 L/min   Delivery systems NASAL CANNULA    pH, Arterial 7.379 7.350 - 7.450   pCO2 arterial 22.8 (L) 32.0 - 48.0 mmHg   pO2, Arterial 88.5 83.0 - 108.0 mmHg   Bicarbonate 13.2 (L) 20.0 - 28.0 mmol/L   Acid-base deficit 11.0 (H) 0.0 - 2.0 mmol/L   O2 Saturation 96.3 %   Patient temperature 98.6    Collection site LEFT RADIAL    Drawn by [phone]    Sample type ARTERIAL DRAW    Allens test (pass/fail) PASS PASS  MRSA PCR Screening     Status: None   Collection Time: 10/23/16  1:58 AM  Result Value Ref Range   MRSA by PCR NEGATIVE NEGATIVE    Comment:        The GeneXpert MRSA Assay (FDA approved for NASAL specimens only), is one component of a comprehensive MRSA colonization surveillance program. It is not intended to diagnose MRSA infection nor to guide or monitor treatment for MRSA infections.   CBC with Differential/Platelet     Status: Abnormal   Collection Time: 10/23/16  2:31 AM  Result Value Ref Range   WBC 8.1 4.0 - 10.5 K/uL   RBC 6.20 (H) 4.22 - 5.81 MIL/uL   Hemoglobin 18.1 (H) 13.0 - 17.0 g/dL    Comment: REPEATED TO VERIFY DELTA CHECK NOTED    HCT 55.5 (H) 39.0 - 52.0 %   MCV 89.5 78.0 - 100.0 fL   MCH 29.2 26.0 - 34.0 pg   MCHC 32.6 30.0 - 36.0 g/dL   RDW 14.4 11.5 - 15.5 %   Platelets 91 (L) 150 - 400 K/uL    Comment: REPEATED TO VERIFY SPECIMEN CHECKED FOR CLOTS PLATELET COUNT CONFIRMED BY SMEAR    Neutrophils Relative % 88 %   Lymphocytes Relative 11 %    Monocytes Relative 1 %   Eosinophils Relative 0 %   Basophils Relative 0 %   Neutro  Abs 7.1 1.7 - 7.7 K/uL   Lymphs Abs 0.9 0.7 - 4.0 K/uL   Monocytes Absolute 0.1 0.1 - 1.0 K/uL   Eosinophils Absolute 0.0 0.0 - 0.7 K/uL   Basophils Absolute 0.0 0.0 - 0.1 K/uL   Smear Review MORPHOLOGY UNREMARKABLE   Comprehensive metabolic panel     Status: Abnormal   Collection Time: 10/23/16  2:31 AM  Result Value Ref Range   Sodium 143 135 - 145 mmol/L   Potassium 4.6 3.5 - 5.1 mmol/L    Comment: DELTA CHECK NOTED SLIGHT HEMOLYSIS    Chloride 112 (H) 101 - 111 mmol/L   CO2 13 (L) 22 - 32 mmol/L   Glucose, Bld 164 (H) 65 - 99 mg/dL   BUN 21 (H) 6 - 20 mg/dL   Creatinine, Ser 2.10 (H) 0.61 - 1.24 mg/dL   Calcium 9.4 8.9 - 10.3 mg/dL   Total Protein 7.8 6.5 - 8.1 g/dL   Albumin 3.6 3.5 - 5.0 g/dL   AST 187 (H) 15 - 41 U/L   ALT 58 17 - 63 U/L   Alkaline Phosphatase 70 38 - 126 U/L   Total Bilirubin 1.8 (H) 0.3 - 1.2 mg/dL   GFR calc non Af Amer 30 (L) >60 mL/min   GFR calc Af Amer 35 (L) >60 mL/min    Comment: (NOTE) The eGFR has been calculated using the CKD EPI equation. This calculation has not been validated in all clinical situations. eGFR's persistently <60 mL/min signify possible Chronic Kidney Disease.    Anion gap 18 (H) 5 - 15  Magnesium     Status: None   Collection Time: 10/23/16  2:31 AM  Result Value Ref Range   Magnesium 2.1 1.7 - 2.4 mg/dL  Phosphorus     Status: None   Collection Time: 10/23/16  2:31 AM  Result Value Ref Range   Phosphorus 3.6 2.5 - 4.6 mg/dL  Glucose, capillary     Status: Abnormal   Collection Time: 10/23/16  7:55 AM  Result Value Ref Range   Glucose-Capillary 197 (H) 65 - 99 mg/dL   Comment 1 Notify RN    Comment 2 Document in Chart   Ethanol     Status: None   Collection Time: 10/23/16  8:59 AM  Result Value Ref Range   Alcohol, Ethyl (B) <10 <10 mg/dL    Comment:        LOWEST DETECTABLE LIMIT FOR SERUM ALCOHOL IS 10 mg/dL FOR  MEDICAL PURPOSES ONLY Please note change in reference range.   Salicylate level     Status: None   Collection Time: 10/23/16  8:59 AM  Result Value Ref Range   Salicylate Lvl <5.8 2.8 - 30.0 mg/dL  Basic metabolic panel     Status: Abnormal   Collection Time: 10/23/16 10:22 AM  Result Value Ref Range   Sodium 144 135 - 145 mmol/L   Potassium 4.4 3.5 - 5.1 mmol/L   Chloride 112 (H) 101 - 111 mmol/L   CO2 17 (L) 22 - 32 mmol/L   Glucose, Bld 241 (H) 65 - 99 mg/dL   BUN 28 (H) 6 - 20 mg/dL   Creatinine, Ser 2.18 (H) 0.61 - 1.24 mg/dL   Calcium 9.2 8.9 - 10.3 mg/dL   GFR calc non Af Amer 28 (L) >60 mL/min   GFR calc Af Amer 33 (L) >60 mL/min    Comment: (NOTE) The eGFR has been calculated using the CKD EPI equation. This calculation has not  been validated in all clinical situations. eGFR's persistently <60 mL/min signify possible Chronic Kidney Disease.    Anion gap 15 5 - 15  CK total and CKMB (cardiac)not at [facility]     Status: Abnormal   Collection Time: 10/23/16 10:22 AM  Result Value Ref Range   Total CK 6,006 (H) 49 - 397 U/L    Comment: RESULTS CONFIRMED BY MANUAL DILUTION   CK, MB 6.6 (H) 0.5 - 5.0 ng/mL   Relative Index 0.1 0.0 - 2.5  Troponin I     Status: Abnormal   Collection Time: 10/23/16 10:22 AM  Result Value Ref Range   Troponin I 0.12 (HH) <0.03 ng/mL    Comment: CRITICAL VALUE NOTED.  VALUE IS CONSISTENT WITH PREVIOUSLY REPORTED AND CALLED VALUE.  Urinalysis, Routine w reflex microscopic     Status: Abnormal   Collection Time: 10/23/16 11:12 AM  Result Value Ref Range   Color, Urine YELLOW YELLOW   APPearance HAZY (A) CLEAR   Specific Gravity, Urine 1.015 1.005 - 1.030   pH 5.0 5.0 - 8.0   Glucose, UA NEGATIVE NEGATIVE mg/dL   Hgb urine dipstick MODERATE (A) NEGATIVE   Bilirubin Urine NEGATIVE NEGATIVE   Ketones, ur 20 (A) NEGATIVE mg/dL   Protein, ur 30 (A) NEGATIVE mg/dL   Nitrite NEGATIVE NEGATIVE   Leukocytes, UA NEGATIVE NEGATIVE   RBC / HPF NONE  SEEN 0 - 5 RBC/hpf   WBC, UA NONE SEEN 0 - 5 WBC/hpf   Bacteria, UA NONE SEEN NONE SEEN   Squamous Epithelial / LPF 0-5 (A) NONE SEEN   Mucus PRESENT     Ct Abdomen Pelvis Wo Contrast  Result Date: 10/22/2016 CLINICAL DATA:  Renal cell cancer, screening for metastasis. Suspected mass the frontal lobe. EXAM: CT CHEST, ABDOMEN AND PELVIS WITHOUT CONTRAST TECHNIQUE: Multidetector CT imaging of the chest, abdomen and pelvis was performed following the standard protocol without IV contrast. COMPARISON:  CT abdomen and pelvis from 12/04/2010. FINDINGS: CT CHEST FINDINGS Cardiovascular: Cardiomegaly without pericardial effusion. There is coronary arteriosclerosis along the circumflex. Mild-to-moderate atherosclerosis of the thoracic aorta without aneurysm. Left subclavian vein stent is noted within the superior mediastinum. Mediastinum/Nodes: No enlarged mediastinal, hilar, or axillary lymph nodes. Thyroid gland, trachea, and esophagus demonstrate no significant findings. Lungs/Pleura: Calcified pleural plaque along the medial aspect of the right hemithorax. Bibasilar dependent and subsegmental atelectasis. No dominant mass. Centrilobular and paraseptal emphysema of the lungs. Musculoskeletal: No aggressive appearing osseous lesions. CT ABDOMEN PELVIS FINDINGS Hepatobiliary: Motion artifacts limit assessment in the upper abdomen. Distended gallbladder containing an air-fluid level within the gallbladder. Trace pneumobilia is also noted in the left hepatic lobe and common bile duct. This in conjunction with small layering stones within the gallbladder could be secondary to passage of a gallstone or instrumentation of the common bile duct. No wall thickening is identified. No definite mass given limitations of a noncontrast study. Pancreas: Unremarkable. No pancreatic ductal dilatation or surrounding inflammatory changes. Spleen: Normal in size without focal abnormality. Adrenals/Urinary Tract: Status post left  nephrectomy. The right kidney is likewise not identified but no surgical clips are seen in the retroperitoneum. Transplanted left pelvic kidney with mild ectasia of the intrarenal collecting system. Contracted bladder with resultant mild mural thickening. Stomach/Bowel: Contracted stomach. Normal small bowel rotation. No small bowel dilatation or obstruction. Normal appendix. Moderate amount of stool is seen within the colon in particular the rectosigmoid. Vascular/Lymphatic: Aortoiliac and branch vessel atherosclerosis. No aneurysm. Mild enlarged abdominal or pelvic lymph  nodes. Reproductive: Normal size prostate. Other: .No free air nor free fluid. Nonspecific soft tissue induration and edema involving the left oblique muscles and overlying subcutaneous soft tissues. Musculoskeletal: No aggressive appearing osseous lesions. IMPRESSION: 1. No findings strongly suspicious for metastatic disease. 2. Centrilobular and paraseptal emphysema of the lungs without dominant mass. Mild pleural thickening and calcific plaque along the right lung base is stable. 3. Air-fluid level within the gallbladder with gallstones and mild intrahepatic and extrahepatic pneumobilia. Findings may represent passage of a gallstone or prior biliary instrumentation. 4. Transplanted left kidney with mild ectasia of the intrarenal collecting system. Mild soft tissue edema and induration, nonspecific of the overlying left lower quadrant soft tissues and flank. 5. Surgically absent native kidneys. Electronically Signed   By: Ashley Royalty M.D.   On: 10/22/2016 23:15   Dg Abd 1 View  Result Date: 10/01/2016 CLINICAL DATA:  Abdominal pain EXAM: ABDOMEN - 1 VIEW COMPARISON:  12/04/2010 CT FINDINGS: Right lung base is clear. Multiple surgical clips in the left upper abdomen. Nonobstructed gas pattern with moderate upper quadrant stool. Clips in the right lower quadrant. Calcified pelvic phleboliths. IMPRESSION: Nonobstructed bowel-gas pattern.  Electronically Signed   By: Donavan Foil M.D.   On: 10/10/2016 21:16   Ct Head Wo Contrast  Result Date: 10/22/2016 CLINICAL DATA:  Left-sided weakness.  Focal neuro deficit EXAM: CT HEAD WITHOUT CONTRAST TECHNIQUE: Contiguous axial images were obtained from the base of the skull through the vertex without intravenous contrast. COMPARISON:  MRI and CT 10/19/2016 FINDINGS: Brain: Ill-defined hypodensity in the right frontal cortex over the convexity unchanged from prior CT. There appears to be effacement of sulci in this area. MRI was indeterminate and incomplete. Negative for acute hemorrhage. Generalized atrophy with chronic ischemic change in the white matter. Vascular: Negative for hyperdense vessel Skull: Negative Sinuses/Orbits: Mild mucosal edema paranasal sinuses. Negative orbit Other: None IMPRESSION: Ill-defined hypodensity in the high right frontal lobe unchanged. The MRI was not diagnostic. This could represent edema related to infarct or tumor. There is mild mass-effect and effacement of the sulci. Repeat MRI is suggested when the patient is able to complete the study. If MRI is not possible, CT without and with contrast suggested to evaluate for enhancing mass Atrophy with chronic microvascular ischemia. Electronically Signed   By: Franchot Gallo M.D.   On: 10/22/2016 13:32   Ct Chest Wo Contrast  Result Date: 10/22/2016 CLINICAL DATA:  Renal cell cancer, screening for metastasis. Suspected mass the frontal lobe. EXAM: CT CHEST, ABDOMEN AND PELVIS WITHOUT CONTRAST TECHNIQUE: Multidetector CT imaging of the chest, abdomen and pelvis was performed following the standard protocol without IV contrast. COMPARISON:  CT abdomen and pelvis from 12/04/2010. FINDINGS: CT CHEST FINDINGS Cardiovascular: Cardiomegaly without pericardial effusion. There is coronary arteriosclerosis along the circumflex. Mild-to-moderate atherosclerosis of the thoracic aorta without aneurysm. Left subclavian vein stent is  noted within the superior mediastinum. Mediastinum/Nodes: No enlarged mediastinal, hilar, or axillary lymph nodes. Thyroid gland, trachea, and esophagus demonstrate no significant findings. Lungs/Pleura: Calcified pleural plaque along the medial aspect of the right hemithorax. Bibasilar dependent and subsegmental atelectasis. No dominant mass. Centrilobular and paraseptal emphysema of the lungs. Musculoskeletal: No aggressive appearing osseous lesions. CT ABDOMEN PELVIS FINDINGS Hepatobiliary: Motion artifacts limit assessment in the upper abdomen. Distended gallbladder containing an air-fluid level within the gallbladder. Trace pneumobilia is also noted in the left hepatic lobe and common bile duct. This in conjunction with small layering stones within the gallbladder could be secondary to  passage of a gallstone or instrumentation of the common bile duct. No wall thickening is identified. No definite mass given limitations of a noncontrast study. Pancreas: Unremarkable. No pancreatic ductal dilatation or surrounding inflammatory changes. Spleen: Normal in size without focal abnormality. Adrenals/Urinary Tract: Status post left nephrectomy. The right kidney is likewise not identified but no surgical clips are seen in the retroperitoneum. Transplanted left pelvic kidney with mild ectasia of the intrarenal collecting system. Contracted bladder with resultant mild mural thickening. Stomach/Bowel: Contracted stomach. Normal small bowel rotation. No small bowel dilatation or obstruction. Normal appendix. Moderate amount of stool is seen within the colon in particular the rectosigmoid. Vascular/Lymphatic: Aortoiliac and branch vessel atherosclerosis. No aneurysm. Mild enlarged abdominal or pelvic lymph nodes. Reproductive: Normal size prostate. Other: .No free air nor free fluid. Nonspecific soft tissue induration and edema involving the left oblique muscles and overlying subcutaneous soft tissues. Musculoskeletal: No  aggressive appearing osseous lesions. IMPRESSION: 1. No findings strongly suspicious for metastatic disease. 2. Centrilobular and paraseptal emphysema of the lungs without dominant mass. Mild pleural thickening and calcific plaque along the right lung base is stable. 3. Air-fluid level within the gallbladder with gallstones and mild intrahepatic and extrahepatic pneumobilia. Findings may represent passage of a gallstone or prior biliary instrumentation. 4. Transplanted left kidney with mild ectasia of the intrarenal collecting system. Mild soft tissue edema and induration, nonspecific of the overlying left lower quadrant soft tissues and flank. 5. Surgically absent native kidneys. Electronically Signed   By: Ashley Royalty M.D.   On: 10/22/2016 23:15   Mr Jodene Nam Head Wo Contrast  Result Date: 09/27/2016 CLINICAL DATA:  Seizure like activity, fatigue. EXAM: MRA HEAD WITHOUT CONTRAST TECHNIQUE: Angiographic images of the Circle of Willis were obtained using MRA technique without intravenous contrast. COMPARISON:  MRI of the head October 21, 2016 at 1445 hours FINDINGS: Severely motion degraded examination. ANTERIOR CIRCULATION: Flow related enhancement internal carotid artery's, proximal MCA and possibly ACA vessels. POSTERIOR CIRCULATION: Flow related enhancement within RIGHT and possibly LEFT vertebral artery, basilar artery and proximal posterior cerebral artery's. Source images and MIP images were reviewed. IMPRESSION: Severely motion degraded examination without internal carotid artery or basilar artery occlusion. Nondiagnostic for assessment of emergent large vessel occlusion. Electronically Signed   By: Elon Alas M.D.   On: 10/15/2016 21:02   US Renal  Result Date: 10/23/2016 CLINICAL DATA:  Acute kidney injury. History of left nephrectomy with left renal transplant. EXAM: RENAL / URINARY TRACT ULTRASOUND COMPLETE COMPARISON:  CT of the abdomen and pelvis on 10/22/2016 FINDINGS: Right Kidney:  Length: Not identified. Suspect right native kidney is surgically absent. Technologist reports a large right flank scar. Left Kidney: Length: Surgically absent native left kidney. Left renal transplant: Length:  12.4 cm.  Normal renal echogenicity.  Mild hydronephrosis. Bladder: The bladder is decompressed. Study quality is degraded because the patient is unable to suspend respirations for the exam due to BiPAP. IMPRESSION: 1. Left nephrectomy. 2. Right kidney is not identified and is likely surgically absent. 3. Hydronephrosis of the transplant kidney in the left renal fossa. Electronically Signed   By: Nolon Nations M.D.   On: 10/23/2016 18:30   Dg Chest Port 1 View  Result Date: 10/23/2016 CLINICAL DATA:  Acute respiratory distress EXAM: PORTABLE CHEST 1 VIEW COMPARISON:  10/02/2016 CXR, chest CT from 10/22/2016 FINDINGS: Stable cardiomegaly with aortic atherosclerosis. Vascular stent projects over the superior mediastinum along the expected location of the left subclavian vein by same day CT. Bibasilar atelectasis  is noted left-greater-than-right. Small left effusion. No overt pulmonary edema. No acute nor suspicious osseous abnormality. IMPRESSION: 1. Bibasilar atelectasis left greater than right with small left pleural effusion. No overt pulmonary edema. 2. Stable cardiomegaly with aortic atherosclerosis. Electronically Signed   By: Ashley Royalty M.D.   On: 10/23/2016 01:42    Impression/Plan   71 y.o. male with what appears to be right frontal lobe mass on MRI. Exam and history limited due to bipap. Still pending complete brain MRI. Currently under [facility] service with Neurology on board. Metastatic work up (CT abd/chest) negative. Cased reviewed with Dr Cyndy Freeze. Further recs pending completion of MRI.

## 2016-10-23 NOTE — Progress Notes (Signed)
LTM was started per Dr Leonel Ramsay; educated nurse and family on event button. Notified Dr Dimitriu.

## 2016-10-23 NOTE — Progress Notes (Signed)
Upon assessment, pt was found to be tachycardic, and respirations were in the 40's. NP was notified. Rapid response and Respiratory were ordered as well. ABG was ordered. CXR completed and pt was transferred to step down for BIPAP. Pt transferred. Care handed over to [facility] receiving RN.

## 2016-10-23 NOTE — Progress Notes (Signed)
MEDICATION RELATED CONSULT NOTE - INITIAL   Pharmacy Consult to review for possible drug interactions between AEDs and anti-rejection medications.   71 year old male presented with new onset seizures and persistent left-sided hemiparesis. Patient was taking mycophenolate 180mg  bid, prograf 4mg  bid and prednisone prior to admit for kidney transplant.   Recent tacrolimus level was 10.7 on 8/7 which was above his goal of ~5-6 and patient was instructed to repeat the level in a week. It does not appear this was ever done according to care everywhere.   A drug review shows moderate interaction with phenytoin and prograf leading to decreased levels of prograf and increased levels of phenytoin. Patient also on dexamethasone which can increase or decrease phenytoin levels.   Recommendations:  -D/w neurology, will change phenytoin to lacosamide which does not interact with his other medications.  -Would still consider checking prograf level due elevated level last month and lack up follow up.   Allergies  Allergen Reactions  . Iodinated Diagnostic Agents Hives and Swelling  . Iodine Hives and Swelling  . Tape Rash    Adhesive tape    Labs:  Recent Labs  09/29/2016 1345 10/15/2016 1402 10/22/16 0638 10/22/16 0830 10/23/16 0231 10/23/16 1022  WBC 7.0  --  8.4  --  8.1  --   HGB 12.9* 13.9 14.3  --  18.1*  --   HCT 39.1 41.0 42.8  --  55.5*  --   PLT 158  --  140*  --  91*  --   APTT 31  --   --   --   --   --   CREATININE 2.29* 2.10* 1.80*  --  2.10* 2.18*  MG  --   --   --  1.9 2.1  --   PHOS  --   --   --  3.0 3.6  --   ALBUMIN 4.0  --  3.7  --  3.6  --   PROT 7.1  --  6.8  --  7.8  --   AST 81*  --  209*  --  187*  --   ALT 26  --  48  --  58  --   ALKPHOS 67  --  63  --  70  --   BILITOT 1.1  --  1.7*  --  1.8*  --    Estimated Creatinine Clearance: 34.6 mL/min (A) (by C-G formula based on SCr of 2.18 mg/dL (H)).   Microbiology:  Medical History: Past Medical History:   Diagnosis Date  . Acute focal neurological deficit 09/2016  . Allergy   . Blood transfusion without reported diagnosis   . Cancer ([place])    right kidney ca  . Confusion    during admission to [facility] 2012, persisted after discharge  . ESRD (end stage renal disease) ([place])   . ESRD (end stage renal disease) on dialysis ([place])    T, Th, Sat HD in [place], [place]  . Gout 01/2002   Right knee; Left great toe  . HLD (hyperlipidemia) 1999  . HTN (hypertension) 1974  . Iron deficiency anemia    PRE 2004/06/2000  . Renal cysts, acquired, bilateral    per [place] PharmD., BCPS Clinical Pharmacist Pager [phone] 10/23/2016 12:49 PM

## 2016-10-23 NOTE — Progress Notes (Signed)
EEG Completed; Results Pending  

## 2016-10-23 NOTE — Consult Note (Signed)
[facility] Renal Consultation Note  Requesting MD: Alfredia Ferguson Indication for Consultation: renal transplant  HPI:  Roger Gibson is a 71 y.o. male with past medical history significant for hypertension, systolic/diastolic congestive heart failure- last EF 40-45%.  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  He is now status post another deceased donor kidney transplant in March 2016. 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  He was brought to the emergency room late on 9/27 with left-sided weakness and possible seizure. Workup possibly consistent with an infarct versus other- neurology is involved- trying to get MRI, currently on antiseizure medications and steroids.  Initial creatinine here was 2.29, improved to 1.8 but now has come up to 2.18.  Urine output has not been recorded but has urine in his Foley bag.  Family says he has not been receiving his antirejection medications.  Urinalysis seems pretty unremarkable  Creatinine  Date/Time Value Ref Range Status  10/09/2012 02:47 PM 9.78 (H) 0.60 - 1.30 mg/dL Final  09/22/2012 05:51 AM 6.60 (H) 0.60 - 1.30 mg/dL Final   Creatinine, Ser  Date/Time Value Ref Range Status  10/23/2016 10:22 AM 2.18 (H) 0.61 - 1.24 mg/dL Final  10/23/2016 02:31 AM 2.10 (H) 0.61 - 1.24 mg/dL Final  10/22/2016 06:38 AM 1.80 (H) 0.61 - 1.24 mg/dL Final  10/11/2016 02:02 PM 2.10 (H) 0.61 - 1.24 mg/dL Final  10/07/2016 01:45 PM 2.29 (H) 0.61 - 1.24 mg/dL Final  05/14/2014 04:26 PM 1.74 (H) 0.50 - 1.35 mg/dL Final  08/02/2012 05:55 AM 7.31 (H) 0.50 - 1.35 mg/dL Final  08/01/2012 05:15 PM 5.75 (H) 0.50 - 1.35 mg/dL Final     Comment:    DIALYSIS  08/01/2012 03:57 AM 9.59 (H) 0.50 - 1.35 mg/dL Final  08/01/2012 03:57 AM 9.44 (H) 0.50 - 1.35 mg/dL Final  06/27/2011 12:30 PM 6.20 (H) 0.50 - 1.35 mg/dL Final  03/24/2010 03:50 AM 20.30 (H) 0.4 - 1.5 mg/dL Final  03/23/2010 06:00 AM 23.05 (H) 0.4 - 1.5 mg/dL Final  03/22/2010 05:00 AM (H) 0.4 - 1.5 mg/dL Final   23.97 REPEATED TO VERIFY RESULTS CONFIRMED BY MANUAL DILUTION  03/21/2010 12:31 PM 26.72 RESULTS CONFIRMED BY MANUAL DILUTION (H) 0.4 - 1.5 mg/dL Final  03/21/2010 10:50 AM 26.58 RESULTS CONFIRMED BY MANUAL DILUTION (H) 0.4 - 1.5 mg/dL Final  03/20/2010 09:00 PM 28.23 RESULTS CONFIRMED BY MANUAL DILUTION (H) 0.4 - 1.5 mg/dL Final  12/23/2009 04:03 PM 15.6 (HH) 0.4 - 1.5 mg/dL Final  04/01/2008 10:33 AM 4.9 (HH) 0.4 - 1.5 mg/dL Final    Comment:    See lab report for associated comment(s)  12/25/2007 09:52 AM 3.4 (H) 0.4 - 1.5 mg/dL Final  06/24/2007 12:02 AM 3.16 (H) 0.40 - 1.50 mg/dL Final    Comment:    See lab report for associated comment(s)  06/15/2007 09:38 AM 3.3 (H) 0.4 - 1.5 mg/dL Final  06/06/2007 09:00 AM 3.4 (H) 0.4 - 1.5 mg/dL Final  12/26/2006 10:04 AM 2.5 (H) 0.4 - 1.5 mg/dL Final  03/10/2006 09:55 AM 10.3 (HH) 0.4 - 1.5 mg/dL Final    Comment:    See  lab report for associated comment(s)     PMHx:   Past Medical History:  Diagnosis Date  . Acute focal neurological deficit 09/2016  . Allergy   . Blood transfusion without reported diagnosis   . Cancer ([place])    right kidney ca  . Confusion    during admission to [facility] 2012, persisted after discharge  . ESRD (end stage renal disease) ([place])   . ESRD (end stage renal disease) on dialysis ([place])    T, Th, Sat HD in [place], [place]  . Gout 01/2002   Right knee; Left great toe  . HLD (hyperlipidemia) 1999  . HTN (hypertension) 1974  . Iron deficiency anemia    PRE 2004/06/2000  . Renal cysts, acquired, bilateral    per Darol Destine     Past Surgical History:  Procedure Laterality Date  . [place]  05/15/03   Polyp (biopsy negative) repeat in 5 years  . COLONOSCOPY  07/18/08   1 rectal polyp (Dr. Wynetta Emery) 5 years  . CT ABD W & PELVIS WO CM  03/03/06   Right kidney absent; Left with cystic changes  . CYSTOSCOPY  03/03/06   Mild to mod. BPH observed/ otherwise normal  . KIDNEY TRANSPLANT  2006-07-14   deceased donor-RLQ Abd, partial neph for renal cell CA/complete Nephrectomy for infection ([facility])  . MR MRA ABDOMEN  2000   3x3 mass left kidney  . NEPHRECTOMY  1965   Left (infection) [place]  . NEPHRECTOMY  2000   partial-(2nd to renal carcinoma) Dr. [place] Crane [facility]    Family Hx:  Family History  Problem Relation Age of Onset  . Stomach cancer Father        ?  Marland Kitchen Hypertension Mother        On dialysis for years  . Hypertension Brother   . Hypertension Brother   . Hypertension Brother   . Hypertension Sister   . Breast cancer Sister        s/p mastectomy  . COPD Sister   . Hypertension Sister   . Kidney failure Sister        on dialysis  . Heart attack Paternal Grandfather   . Colon cancer Neg Hx   . Esophageal cancer Neg Hx   . Rectal cancer Neg Hx     Social History:  reports that he quit smoking about 18 years ago. His smoking use included Cigarettes and Cigars. He has never used smokeless tobacco. He reports that he does not drink alcohol or use drugs.  Allergies:  Allergies  Allergen Reactions  . Iodinated Diagnostic Agents Hives and Swelling  . Iodine Hives and Swelling  . Tape Rash    Adhesive tape     Medications: Prior to Admission medications   Medication Sig Start Date End Date Taking? Authorizing Provider  aspirin EC 81 MG tablet Take 81 mg by mouth daily.   Yes [provider]  Cholecalciferol 2000 units TABS Take 2,000 Units by mouth daily.   Yes [provider]  furosemide (LASIX) 20 MG tablet Take 10 mg  by mouth daily.    Yes [provider]  labetalol (NORMODYNE) 100 MG tablet Take 400 mg by mouth 2 (two) times daily.    Yes [provider]  Multiple Vitamin (MULTIVITAMIN WITH MINERALS) TABS tablet Take 1 tablet by mouth daily.   Yes [provider]  mycophenolate (MYFORTIC) 180  MG EC tablet Take 180 mg by mouth 2 (two) times daily.    Yes [provider]  pravastatin (PRAVACHOL) 20 MG tablet Take 20 mg by mouth every evening.   Yes [provider]  predniSONE (DELTASONE) 5 MG tablet Take 5 mg by mouth daily with breakfast.    Yes [provider]  sodium bicarbonate 650 MG tablet Take 650 mg by mouth 2 (two) times daily.   Yes [provider]  tacrolimus (PROGRAF) 1 MG capsule Take 4 mg by mouth 2 (two) times daily.    Yes [provider]  tamsulosin (FLOMAX) 0.4 MG CAPS capsule Take 0.4 mg by mouth daily after supper.   Yes [provider]  omeprazole (PRILOSEC) 20 MG capsule Take 20 mg by mouth daily.    [provider]  sertraline (ZOLOFT) 50 MG tablet Take 50 mg by mouth daily.    [provider]    I have reviewed the patient's current medications.  Labs:  Results for orders placed or performed during the hospital encounter of 10/05/2016 (from the past 48 hour(s))  Protime-INR     Status: None   Collection Time: 10/04/2016  1:45 PM  Result Value Ref Range   Prothrombin Time 13.9 11.4 - 15.2 seconds   INR 1.08   APTT     Status: None   Collection Time: 10/13/2016  1:45 PM  Result Value Ref Range   aPTT 31 24 - 36 seconds  CBC     Status: Abnormal   Collection Time: 10/24/2016  1:45 PM  Result Value Ref Range   WBC 7.0 4.0 - 10.5 K/uL   RBC 4.45 4.22 - 5.81 MIL/uL   Hemoglobin 12.9 (L) 13.0 - 17.0 g/dL   HCT 39.1 39.0 - 52.0 %   MCV 87.9 78.0 - 100.0 fL   MCH 29.0 26.0 - 34.0 pg   MCHC 33.0 30.0 - 36.0 g/dL   RDW 13.3 11.5 - 15.5 %   Platelets 158 150 - 400 K/uL  Differential      Status: None   Collection Time: 10/04/2016  1:45 PM  Result Value Ref Range   Neutrophils Relative % 77 %   Neutro Abs 5.3 1.7 - 7.7 K/uL   Lymphocytes Relative 14 %   Lymphs Abs 1.0 0.7 - 4.0 K/uL   Monocytes Relative 9 %   Monocytes Absolute 0.7 0.1 - 1.0 K/uL   Eosinophils Relative 0 %   Eosinophils Absolute 0.0 0.0 - 0.7 K/uL   Basophils Relative 0 %   Basophils Absolute 0.0 0.0 - 0.1 K/uL  Comprehensive metabolic panel     Status: Abnormal   Collection Time: 10/16/2016  1:45 PM  Result Value Ref Range   Sodium 141 135 - 145 mmol/L   Potassium 3.3 (L) 3.5 - 5.1 mmol/L   Chloride 108 101 - 111 mmol/L   CO2 22 22 - 32 mmol/L   Glucose, Bld 102 (H) 65 - 99 mg/dL   BUN 23 (H) 6 - 20 mg/dL   Creatinine, Ser 2.29 (H) 0.61 - 1.24 mg/dL   Calcium 9.7 8.9 - 10.3 mg/dL   Total Protein 7.1 6.5 - 8.1 g/dL   Albumin 4.0 3.5 - 5.0 g/dL   AST 81 (H) 15 - 41 U/L   ALT 26 17 - 63 U/L   Alkaline Phosphatase 67 38 - 126 U/L   Total Bilirubin 1.1 0.3 - 1.2 mg/dL   GFR calc non Af Amer 27 (L) >60  mL/min   GFR calc Af Amer 31 (L) >60 mL/min    Comment: (NOTE) The eGFR has been calculated using the CKD EPI equation. This calculation has not been validated in all clinical situations. eGFR's persistently <60 mL/min signify possible Chronic Kidney Disease.    Anion gap 11 5 - 15  I-stat troponin, ED     Status: Abnormal   Collection Time: 10/17/2016  2:00 PM  Result Value Ref Range   Troponin i, poc 0.10 (HH) 0.00 - 0.08 ng/mL   Comment NOTIFIED PHYSICIAN    Comment 3            Comment: Due to the release kinetics of cTnI, a negative result within the first hours of the onset of symptoms does not rule out myocardial infarction with certainty. If myocardial infarction is still suspected, repeat the test at appropriate intervals.   I-Stat Chem 8, ED     Status: Abnormal   Collection Time: 10/02/2016  2:02 PM  Result Value Ref Range   Sodium 142 135 - 145 mmol/L   Potassium 3.4 (L) 3.5 - 5.1  mmol/L   Chloride 108 101 - 111 mmol/L   BUN 27 (H) 6 - 20 mg/dL   Creatinine, Ser 2.10 (H) 0.61 - 1.24 mg/dL   Glucose, Bld 100 (H) 65 - 99 mg/dL   Calcium, Ion 1.15 1.15 - 1.40 mmol/L   TCO2 22 22 - 32 mmol/L   Hemoglobin 13.9 13.0 - 17.0 g/dL   HCT 41.0 39.0 - 52.0 %  Urinalysis, Routine w reflex microscopic     Status: Abnormal   Collection Time: 10/19/2016  3:44 PM  Result Value Ref Range   Color, Urine YELLOW YELLOW   APPearance CLEAR CLEAR   Specific Gravity, Urine 1.014 1.005 - 1.030   pH 5.0 5.0 - 8.0   Glucose, UA NEGATIVE NEGATIVE mg/dL   Hgb urine dipstick LARGE (A) NEGATIVE   Bilirubin Urine NEGATIVE NEGATIVE   Ketones, ur 20 (A) NEGATIVE mg/dL   Protein, ur 30 (A) NEGATIVE mg/dL   Nitrite NEGATIVE NEGATIVE   Leukocytes, UA NEGATIVE NEGATIVE   RBC / HPF 0-5 0 - 5 RBC/hpf   WBC, UA 0-5 0 - 5 WBC/hpf   Bacteria, UA RARE (A) NONE SEEN   Squamous Epithelial / LPF NONE SEEN NONE SEEN  CBG monitoring, ED     Status: Abnormal   Collection Time: 09/26/2016  4:20 PM  Result Value Ref Range   Glucose-Capillary 111 (H) 65 - 99 mg/dL  CK     Status: Abnormal   Collection Time: 10/13/2016  7:19 PM  Result Value Ref Range   Total CK 9,711 (H) 49 - 397 U/L    Comment: RESULTS CONFIRMED BY MANUAL DILUTION  Brain natriuretic peptide     Status: Abnormal   Collection Time: 10/04/2016  7:19 PM  Result Value Ref Range   B Natriuretic Peptide 264.7 (H) 0.0 - 100.0 pg/mL  Troponin I     Status: Abnormal   Collection Time: 10/02/2016  7:19 PM  Result Value Ref Range   Troponin I 0.13 (HH) <0.03 ng/mL    Comment: CRITICAL RESULT CALLED TO, READ BACK BY AND VERIFIED WITH: SANDERS A,RN 10/08/2016 2036 WAYK   Troponin I     Status: Abnormal   Collection Time: 10/06/2016 11:25 PM  Result Value Ref Range   Troponin I 0.17 (HH) <0.03 ng/mL    Comment: CRITICAL VALUE NOTED.  VALUE IS CONSISTENT WITH PREVIOUSLY REPORTED AND CALLED  VALUE.  Comprehensive metabolic panel     Status: Abnormal    Collection Time: 10/22/16  6:38 AM  Result Value Ref Range   Sodium 142 135 - 145 mmol/L   Potassium 3.8 3.5 - 5.1 mmol/L   Chloride 112 (H) 101 - 111 mmol/L   CO2 14 (L) 22 - 32 mmol/L   Glucose, Bld 97 65 - 99 mg/dL   BUN 18 6 - 20 mg/dL   Creatinine, Ser 1.80 (H) 0.61 - 1.24 mg/dL   Calcium 9.1 8.9 - 10.3 mg/dL   Total Protein 6.8 6.5 - 8.1 g/dL   Albumin 3.7 3.5 - 5.0 g/dL   AST 209 (H) 15 - 41 U/L   ALT 48 17 - 63 U/L   Alkaline Phosphatase 63 38 - 126 U/L   Total Bilirubin 1.7 (H) 0.3 - 1.2 mg/dL   GFR calc non Af Amer 36 (L) >60 mL/min   GFR calc Af Amer 42 (L) >60 mL/min    Comment: (NOTE) The eGFR has been calculated using the CKD EPI equation. This calculation has not been validated in all clinical situations. eGFR's persistently <60 mL/min signify possible Chronic Kidney Disease.    Anion gap 16 (H) 5 - 15  CBC     Status: Abnormal   Collection Time: 10/22/16  6:38 AM  Result Value Ref Range   WBC 8.4 4.0 - 10.5 K/uL   RBC 4.81 4.22 - 5.81 MIL/uL   Hemoglobin 14.3 13.0 - 17.0 g/dL   HCT 42.8 39.0 - 52.0 %   MCV 89.0 78.0 - 100.0 fL   MCH 29.7 26.0 - 34.0 pg   MCHC 33.4 30.0 - 36.0 g/dL   RDW 14.1 11.5 - 15.5 %   Platelets 140 (L) 150 - 400 K/uL  Troponin I     Status: Abnormal   Collection Time: 10/22/16  6:38 AM  Result Value Ref Range   Troponin I 0.22 (HH) <0.03 ng/mL    Comment: CRITICAL VALUE NOTED.  VALUE IS CONSISTENT WITH PREVIOUSLY REPORTED AND CALLED VALUE.  CK     Status: Abnormal   Collection Time: 10/22/16  6:38 AM  Result Value Ref Range   Total CK 12,724 (H) 49 - 397 U/L    Comment: RESULTS CONFIRMED BY MANUAL DILUTION  Glucose, capillary     Status: None   Collection Time: 10/22/16  7:43 AM  Result Value Ref Range   Glucose-Capillary 81 65 - 99 mg/dL  Magnesium     Status: None   Collection Time: 10/22/16  8:30 AM  Result Value Ref Range   Magnesium 1.9 1.7 - 2.4 mg/dL  Phosphorus     Status: None   Collection Time: 10/22/16  8:30 AM   Result Value Ref Range   Phosphorus 3.0 2.5 - 4.6 mg/dL  Blood gas, arterial     Status: Abnormal   Collection Time: 10/23/16 12:20 AM  Result Value Ref Range   O2 Content 2.0 L/min   Delivery systems NASAL CANNULA    pH, Arterial 7.379 7.350 - 7.450   pCO2 arterial 22.8 (L) 32.0 - 48.0 mmHg   pO2, Arterial 88.5 83.0 - 108.0 mmHg   Bicarbonate 13.2 (L) 20.0 - 28.0 mmol/L   Acid-base deficit 11.0 (H) 0.0 - 2.0 mmol/L   O2 Saturation 96.3 %   Patient temperature 98.6    Collection site LEFT RADIAL    Drawn by [id number]    Sample type ARTERIAL DRAW    Allens test (pass/fail)  PASS PASS  MRSA PCR Screening     Status: None   Collection Time: 10/23/16  1:58 AM  Result Value Ref Range   MRSA by PCR NEGATIVE NEGATIVE    Comment:        The GeneXpert MRSA Assay (FDA approved for NASAL specimens only), is one component of a comprehensive MRSA colonization surveillance program. It is not intended to diagnose MRSA infection nor to guide or monitor treatment for MRSA infections.   CBC with Differential/Platelet     Status: Abnormal   Collection Time: 10/23/16  2:31 AM  Result Value Ref Range   WBC 8.1 4.0 - 10.5 K/uL   RBC 6.20 (H) 4.22 - 5.81 MIL/uL   Hemoglobin 18.1 (H) 13.0 - 17.0 g/dL    Comment: REPEATED TO VERIFY DELTA CHECK NOTED    HCT 55.5 (H) 39.0 - 52.0 %   MCV 89.5 78.0 - 100.0 fL   MCH 29.2 26.0 - 34.0 pg   MCHC 32.6 30.0 - 36.0 g/dL   RDW 14.4 11.5 - 15.5 %   Platelets 91 (L) 150 - 400 K/uL    Comment: REPEATED TO VERIFY SPECIMEN CHECKED FOR CLOTS PLATELET COUNT CONFIRMED BY SMEAR    Neutrophils Relative % 88 %   Lymphocytes Relative 11 %   Monocytes Relative 1 %   Eosinophils Relative 0 %   Basophils Relative 0 %   Neutro Abs 7.1 1.7 - 7.7 K/uL   Lymphs Abs 0.9 0.7 - 4.0 K/uL   Monocytes Absolute 0.1 0.1 - 1.0 K/uL   Eosinophils Absolute 0.0 0.0 - 0.7 K/uL   Basophils Absolute 0.0 0.0 - 0.1 K/uL   Smear Review MORPHOLOGY UNREMARKABLE   Comprehensive  metabolic panel     Status: Abnormal   Collection Time: 10/23/16  2:31 AM  Result Value Ref Range   Sodium 143 135 - 145 mmol/L   Potassium 4.6 3.5 - 5.1 mmol/L    Comment: DELTA CHECK NOTED SLIGHT HEMOLYSIS    Chloride 112 (H) 101 - 111 mmol/L   CO2 13 (L) 22 - 32 mmol/L   Glucose, Bld 164 (H) 65 - 99 mg/dL   BUN 21 (H) 6 - 20 mg/dL   Creatinine, Ser 2.10 (H) 0.61 - 1.24 mg/dL   Calcium 9.4 8.9 - 10.3 mg/dL   Total Protein 7.8 6.5 - 8.1 g/dL   Albumin 3.6 3.5 - 5.0 g/dL   AST 187 (H) 15 - 41 U/L   ALT 58 17 - 63 U/L   Alkaline Phosphatase 70 38 - 126 U/L   Total Bilirubin 1.8 (H) 0.3 - 1.2 mg/dL   GFR calc non Af Amer 30 (L) >60 mL/min   GFR calc Af Amer 35 (L) >60 mL/min    Comment: (NOTE) The eGFR has been calculated using the CKD EPI equation. This calculation has not been validated in all clinical situations. eGFR's persistently <60 mL/min signify possible Chronic Kidney Disease.    Anion gap 18 (H) 5 - 15  Magnesium     Status: None   Collection Time: 10/23/16  2:31 AM  Result Value Ref Range   Magnesium 2.1 1.7 - 2.4 mg/dL  Phosphorus     Status: None   Collection Time: 10/23/16  2:31 AM  Result Value Ref Range   Phosphorus 3.6 2.5 - 4.6 mg/dL  Glucose, capillary     Status: Abnormal   Collection Time: 10/23/16  7:55 AM  Result Value Ref Range   Glucose-Capillary 197 (H) 65 -  99 mg/dL   Comment 1 Notify RN    Comment 2 Document in Chart   Ethanol     Status: None   Collection Time: 10/23/16  8:59 AM  Result Value Ref Range   Alcohol, Ethyl (B) <10 <10 mg/dL    Comment:        LOWEST DETECTABLE LIMIT FOR SERUM ALCOHOL IS 10 mg/dL FOR MEDICAL PURPOSES ONLY Please note change in reference range.   Salicylate level     Status: None   Collection Time: 10/23/16  8:59 AM  Result Value Ref Range   Salicylate Lvl <6.5 2.8 - 30.0 mg/dL  Basic metabolic panel     Status: Abnormal (Preliminary result)   Collection Time: 10/23/16 10:22 AM  Result Value Ref Range    Sodium PENDING 135 - 145 mmol/L   Potassium PENDING 3.5 - 5.1 mmol/L   Chloride PENDING 101 - 111 mmol/L   CO2 PENDING 22 - 32 mmol/L   Glucose, Bld 241 (H) 65 - 99 mg/dL   BUN PENDING 6 - 20 mg/dL   Creatinine, Ser 2.18 (H) 0.61 - 1.24 mg/dL   Calcium PENDING 8.9 - 10.3 mg/dL   GFR calc non Af Amer 28 (L) >60 mL/min   GFR calc Af Amer 33 (L) >60 mL/min    Comment: (NOTE) The eGFR has been calculated using the CKD EPI equation. This calculation has not been validated in all clinical situations. eGFR's persistently <60 mL/min signify possible Chronic Kidney Disease.    Anion gap PENDING 5 - 15  CK total and CKMB (cardiac)not at [facility]     Status: Abnormal (Preliminary result)   Collection Time: 10/23/16 10:22 AM  Result Value Ref Range   Total CK PENDING 49 - 397 U/L   CK, MB 6.6 (H) 0.5 - 5.0 ng/mL   Relative Index PENDING 0.0 - 2.5  Troponin I     Status: Abnormal   Collection Time: 10/23/16 10:22 AM  Result Value Ref Range   Troponin I 0.12 (HH) <0.03 ng/mL    Comment: CRITICAL VALUE NOTED.  VALUE IS CONSISTENT WITH PREVIOUSLY REPORTED AND CALLED VALUE.  Urinalysis, Routine w reflex microscopic     Status: Abnormal   Collection Time: 10/23/16 11:12 AM  Result Value Ref Range   Color, Urine YELLOW YELLOW   APPearance HAZY (A) CLEAR   Specific Gravity, Urine 1.015 1.005 - 1.030   pH 5.0 5.0 - 8.0   Glucose, UA NEGATIVE NEGATIVE mg/dL   Hgb urine dipstick MODERATE (A) NEGATIVE   Bilirubin Urine NEGATIVE NEGATIVE   Ketones, ur 20 (A) NEGATIVE mg/dL   Protein, ur 30 (A) NEGATIVE mg/dL   Nitrite NEGATIVE NEGATIVE   Leukocytes, UA NEGATIVE NEGATIVE   RBC / HPF NONE SEEN 0 - 5 RBC/hpf   WBC, UA NONE SEEN 0 - 5 WBC/hpf   Bacteria, UA NONE SEEN NONE SEEN   Squamous Epithelial / LPF 0-5 (A) NONE SEEN   Mucus PRESENT      ROS:  Review of systems not obtained due to patient factors.  Physical Exam: Vitals:   10/23/16 1100 10/23/16 1154  BP: (!) 152/98   Pulse: 100    Resp: (!) 22 (!) 28  Temp: 99.1 F (37.3 C)   SpO2: 100% 99%     General: elderly appearing black male on BiPAP. Answers direct questions to the affirmative- currently undergoing EEG HEENT: pupils are equally round and reactive to light, extra ocular motions are intact Neck: no JVD Heart:  borderline tachycardic Lungs: mostly clear Abdomen: obese, soft, nontender. Incision in right lower quadrant, allograft is nontender Extremities: mild pitting edema Skin: warm and dry Neuro: somnolent but arousable  Assessment/Plan: 71 year old black male status post renal transplant with a baseline creatinine between 1.5 and 1.7. Transplant has been complicated by previous rejection, obstruction by hematoma as well as BK 1.Renal- some acute on chronic renal failure in the setting of hospitalization for possibly stroke/seizure/another brain abnormality with decreased mental status. Blood pressure of anything is high. Urinalysis is unremarkable so not making me think of rejection. I will ask pharmacy assistance to get his antirejection medications changed to IV form and check a Prograf level in the morning 2. Hypertension/volume  - if anything blood pressure is high.unclear if cast to do with possible stroke.  Now starting steroids which will not help.  He was on labetalol as outpatient. Currently getting IV when necessary here.  Hemoglobin is high here. 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 Neuro  - seems to be his biggest issue at this time. Neurology involved, EEG right now and planning for MRI in the future to further delineate this brain abnormality 5. ID- is on immune suppression so infectious disease should be in the differential. White blood count is reasonable. Has been started on empiric Zosyn   Malak Duchesneau A 10/23/2016, 1:41 PM

## 2016-10-24 ENCOUNTER — Other Ambulatory Visit (HOSPITAL_COMMUNITY): Payer: Medicare HMO

## 2016-10-24 ENCOUNTER — Inpatient Hospital Stay (HOSPITAL_COMMUNITY): Payer: Medicare HMO

## 2016-10-24 DIAGNOSIS — R0602 Shortness of breath: Secondary | ICD-10-CM

## 2016-10-24 LAB — GLUCOSE, CAPILLARY
GLUCOSE-CAPILLARY: 123 mg/dL — AB (ref 65–99)
GLUCOSE-CAPILLARY: 166 mg/dL — AB (ref 65–99)
Glucose-Capillary: 256 mg/dL — ABNORMAL HIGH (ref 65–99)
Glucose-Capillary: 273 mg/dL — ABNORMAL HIGH (ref 65–99)
Glucose-Capillary: 183 mg/dL — ABNORMAL HIGH (ref 65–99)

## 2016-10-24 LAB — RENAL FUNCTION PANEL
Albumin: 2.8 g/dL — ABNORMAL LOW (ref 3.5–5.0)
Anion gap: 10 (ref 5–15)
BUN: 31 mg/dL — ABNORMAL HIGH (ref 6–20)
CALCIUM: 9.1 mg/dL (ref 8.9–10.3)
CO2: 26 mmol/L (ref 22–32)
CREATININE: 1.9 mg/dL — AB (ref 0.61–1.24)
Chloride: 111 mmol/L (ref 101–111)
GFR calc non Af Amer: 34 mL/min — ABNORMAL LOW (ref >60)
GFR, EST AFRICAN AMERICAN: 39 mL/min — AB (ref >60)
GLUCOSE: 288 mg/dL — AB (ref 65–99)
Phosphorus: 2 mg/dL — ABNORMAL LOW (ref 2.5–4.6)
Potassium: 4.1 mmol/L (ref 3.5–5.1)
SODIUM: 147 mmol/L — AB (ref 135–145)

## 2016-10-24 LAB — COMPREHENSIVE METABOLIC PANEL
ALBUMIN: 2.9 g/dL — AB (ref 3.5–5.0)
ALK PHOS: 55 U/L (ref 38–126)
ALT: 52 U/L (ref 17–63)
AST: 110 U/L — AB (ref 15–41)
Anion gap: 14 (ref 5–15)
BILIRUBIN TOTAL: 1.2 mg/dL (ref 0.3–1.2)
BUN: 27 mg/dL — AB (ref 6–20)
CALCIUM: 9.1 mg/dL (ref 8.9–10.3)
CO2: 24 mmol/L (ref 22–32)
Chloride: 109 mmol/L (ref 101–111)
Creatinine, Ser: 1.9 mg/dL — ABNORMAL HIGH (ref 0.61–1.24)
GFR calc Af Amer: 39 mL/min — ABNORMAL LOW (ref >60)
GFR calc non Af Amer: 34 mL/min — ABNORMAL LOW (ref >60)
Glucose, Bld: 285 mg/dL — ABNORMAL HIGH (ref 65–99)
POTASSIUM: 3.8 mmol/L (ref 3.5–5.1)
SODIUM: 147 mmol/L — AB (ref 135–145)
Total Protein: 6.5 g/dL (ref 6.5–8.1)

## 2016-10-24 LAB — CBC WITH DIFFERENTIAL/PLATELET
HEMATOCRIT: 36.6 % — AB (ref 39.0–52.0)
HEMOGLOBIN: 12.1 g/dL — AB (ref 13.0–17.0)
MCH: 28.9 pg (ref 26.0–34.0)
MCHC: 33.1 g/dL (ref 30.0–36.0)
MCV: 87.6 fL (ref 78.0–100.0)
Platelets: 121 10*3/uL — ABNORMAL LOW (ref 150–400)
RBC: 4.18 MIL/uL — ABNORMAL LOW (ref 4.22–5.81)
RDW: 14.2 % (ref 11.5–15.5)
WBC: 10.4 10*3/uL (ref 4.0–10.5)

## 2016-10-24 LAB — ECHOCARDIOGRAM COMPLETE
EERAT: 9.12
FS: 32 % (ref 28–44)
HEIGHTINCHES: 70 in
IVS/LV PW RATIO, ED: 1.16
LA ID, A-P, ES: 34 mm
LA diam end sys: 34 mm
LA diam index: 1.61 cm/m2
LA vol index: 29.6 mL/m2
LA vol: 62.4 mL
LAVOLA4C: 64.4 mL
LV E/e'average: 9.12
LV PW d: 11.2 mm — AB (ref 0.6–1.1)
LVEEMED: 9.12
LVELAT: 8.38 cm/s
LVOT SV: 62 mL
LVOT VTI: 19.6 cm
LVOT area: 3.14 cm2
LVOT diameter: 20 mm
LVOT peak grad rest: 5 mmHg
LVOTPV: 108 cm/s
Lateral S' vel: 16.8 cm/s
MV VTI: 119 cm
MV pk A vel: 131 m/s
MV pk E vel: 76.4 m/s
MVPG: 2 mmHg
TAPSE: 11.1 mm
TDI e' lateral: 8.38
TDI e' medial: 7.51
WEIGHTICAEL: 3280.44 [oz_av]

## 2016-10-24 LAB — MAGNESIUM: MAGNESIUM: 2.1 mg/dL (ref 1.7–2.4)

## 2016-10-24 LAB — URINE CULTURE

## 2016-10-24 LAB — PHOSPHORUS: PHOSPHORUS: 1.8 mg/dL — AB (ref 2.5–4.6)

## 2016-10-24 MED ORDER — CAMPHOR-MENTHOL 0.5-0.5 % EX LOTN
TOPICAL_LOTION | CUTANEOUS | Status: DC | PRN
  Administered 2016-10-24: 06:00:00 via TOPICAL
  Administered 2016-10-26: 1 via TOPICAL
  Filled 2016-10-24: qty 222

## 2016-10-24 MED ORDER — DIPHENHYDRAMINE HCL 50 MG/ML IJ SOLN
25.0000 mg | Freq: Four times a day (QID) | INTRAMUSCULAR | Status: DC | PRN
  Administered 2016-10-24 – 2016-10-25 (×5): 25 mg via INTRAVENOUS
  Filled 2016-10-24 (×6): qty 1

## 2016-10-24 MED ORDER — INSULIN ASPART 100 UNIT/ML ~~LOC~~ SOLN
0.0000 [IU] | SUBCUTANEOUS | Status: DC
  Administered 2016-10-24 (×2): 2 [IU] via SUBCUTANEOUS
  Administered 2016-10-24: 5 [IU] via SUBCUTANEOUS
  Administered 2016-10-24: 3 [IU] via SUBCUTANEOUS
  Administered 2016-10-25: 1 [IU] via SUBCUTANEOUS
  Administered 2016-10-25 (×5): 2 [IU] via SUBCUTANEOUS
  Administered 2016-10-26 (×2): 3 [IU] via SUBCUTANEOUS
  Administered 2016-10-26 (×2): 2 [IU] via SUBCUTANEOUS
  Administered 2016-10-26: 3 [IU] via SUBCUTANEOUS
  Administered 2016-10-26: 1 [IU] via SUBCUTANEOUS
  Administered 2016-10-26 – 2016-10-27 (×4): 2 [IU] via SUBCUTANEOUS
  Administered 2016-10-28: 4 [IU] via SUBCUTANEOUS
  Administered 2016-10-28 (×3): 3 [IU] via SUBCUTANEOUS
  Administered 2016-10-29 (×4): 2 [IU] via SUBCUTANEOUS
  Administered 2016-10-29 – 2016-10-30 (×4): 1 [IU] via SUBCUTANEOUS
  Administered 2016-10-30: 2 [IU] via SUBCUTANEOUS
  Administered 2016-10-31: 1 [IU] via SUBCUTANEOUS
  Administered 2016-10-31: 2 [IU] via SUBCUTANEOUS
  Administered 2016-10-31 (×2): 1 [IU] via SUBCUTANEOUS
  Administered 2016-11-01: 2 [IU] via SUBCUTANEOUS
  Administered 2016-11-01 (×2): 3 [IU] via SUBCUTANEOUS
  Administered 2016-11-01 (×2): 2 [IU] via SUBCUTANEOUS
  Administered 2016-11-01: 5 [IU] via SUBCUTANEOUS
  Administered 2016-11-02: 7 [IU] via SUBCUTANEOUS
  Administered 2016-11-02 (×2): 9 [IU] via SUBCUTANEOUS
  Administered 2016-11-02: 5 [IU] via SUBCUTANEOUS
  Administered 2016-11-02: 7 [IU] via SUBCUTANEOUS
  Administered 2016-11-02: 9 [IU] via SUBCUTANEOUS

## 2016-10-24 MED ORDER — VALPROATE SODIUM 500 MG/5ML IV SOLN
2000.0000 mg | Freq: Once | INTRAVENOUS | Status: AC
  Administered 2016-10-24: 2000 mg via INTRAVENOUS
  Filled 2016-10-24: qty 20

## 2016-10-24 MED ORDER — IPRATROPIUM BROMIDE 0.02 % IN SOLN
0.5000 mg | Freq: Three times a day (TID) | RESPIRATORY_TRACT | Status: DC
  Administered 2016-10-24 – 2016-10-27 (×9): 0.5 mg via RESPIRATORY_TRACT
  Filled 2016-10-24 (×11): qty 2.5

## 2016-10-24 MED ORDER — VALPROATE SODIUM 500 MG/5ML IV SOLN
500.0000 mg | Freq: Three times a day (TID) | INTRAVENOUS | Status: DC
  Administered 2016-10-24 – 2016-11-02 (×24): 500 mg via INTRAVENOUS
  Filled 2016-10-24 (×28): qty 5

## 2016-10-24 MED ORDER — HYDRALAZINE HCL 20 MG/ML IJ SOLN
10.0000 mg | Freq: Four times a day (QID) | INTRAMUSCULAR | Status: DC
  Administered 2016-10-24 – 2016-10-28 (×17): 10 mg via INTRAVENOUS
  Filled 2016-10-24 (×16): qty 1

## 2016-10-24 MED ORDER — LABETALOL HCL 5 MG/ML IV SOLN
5.0000 mg | INTRAVENOUS | Status: DC | PRN
  Administered 2016-10-25: 10 mg via INTRAVENOUS
  Filled 2016-10-24: qty 4

## 2016-10-24 MED ORDER — LEVALBUTEROL HCL 0.63 MG/3ML IN NEBU
0.6300 mg | INHALATION_SOLUTION | Freq: Three times a day (TID) | RESPIRATORY_TRACT | Status: DC
  Administered 2016-10-24 – 2016-10-27 (×9): 0.63 mg via RESPIRATORY_TRACT
  Filled 2016-10-24 (×11): qty 3

## 2016-10-24 NOTE — Progress Notes (Signed)
LTM discontinued. No skin breakdown was seen. 

## 2016-10-24 NOTE — Procedures (Signed)
Video EEG Monitoring Report    Dates of monitoring:   10/23/16 @13 :28 to 10/24/16 @07 :30  Recording day:    1 (started on 9/29) EEG Number:    [id number] Requesting provider:   Roland Rack Interpreting physician:  Becky Sax, MD  CPT:  [phone] ICD-10:   R56.9   Present History: 71 year old man with new onset seizures and persistent left hemiparesis. Continuous VEEG requested to evaluate for seizures.    EEG Classification  Abnormal, Awake and sleep   PDR  8 Hz, reactive and affected by overlaying slow on the right  HR  90-110 bpm and regular    Background abnormalities:   1. Continuous slow, right hemisphere   2. Continuous slow, generalized    Periodic, rhythmic or epileptiform abnormalities:   1. Sharp LPDs, right (parasagittal)   2. Triphasic GPDs (poorly sustained)   Ictal phenomena:  none     EEG DETAILS:  TYPE OF RECORDING: At least 18-channel digital EEG with using a standard international 10-20 placement with additional EEG electrodes, and 1 additional channel dedicated to the EKG, at a sampling rate of at least 256 Hz. Video was recorded throughout the study. The recording was interpreted using digital review software allowing for montage reformatting, gain and filter changes. Each page was reviewed manually. Automatic spike and seizure detection software and quantitative analysis tools were used as needed.   Description of EEG features: The recording reveals a continuous, variable and reactive asymmetric background with state changes.   On the left, during wakefulness normal voltage, mixed frequency rhythms are seen with a dominant theta>delta background and superimposed beta activity which has normal amplitude. A posterior dominant rhythm of 8 Hz is observed, which is symmetric and attenuates normally with eye opening. Constant waxing and waning diffuse delta slowing is seen, however, and it accounts for more than 70% of the awake record.   On the  left, there is a more well defined, higher amplitude delta slowing focally maximum in the anterior>posterior and parasagittal>temporal fields.  In addition, there is an intermittent periodic pattern of broad field waveforms maximum in the right parasagittal chain. This pattern is abundant (~50-60%  of the recording) and more prominent in drowsiness and sleep, long (5-59 minutes) in duration, with a period ranging from 1 / 2 seconds to 1 / second and are triphasic, sharply contoured and of medium amplitude (50-200 V). There are no discrete periods of evolution.   This is distinct from other slow delta triphasic waveforms that are seen bilaterally more prominent in wakefulness. These are intermittent, higher amplitude, broader bilateral field and generally fail to form a sustained periodic pattern. These are more akin to classic triphasic waves.   Sleep is characterized by loss of PDR and diffuse delta slowing, with few sleep structures better formed on the left.   Push Button Events: From 05:00 to 05:04 there are 9 push button events. It is unclear what prompts these button pushes. The patient is awake moving the RUE semipurposefully. There is no EEG ictal pattern.   Interpretation: This EEG shows evidence of a structural abnormality in the right hemisphere, with epileptogenic potential and suggestion of acute-subacute injury. There is periodic  pattern of sharp waves ("PLEDs"). This is indicative of a high risk of seizure occurrence, but no distinct seizures are seen. Clinical correlation is required since this pattern lies on an ictal-interictal continuum.  Additionally, there is a moderate diffuse encephalopathy with occasional "triphasic waves" indicative of a toxic-metabolic component.

## 2016-10-24 NOTE — Progress Notes (Signed)
  Echocardiogram 2D Echocardiogram has been performed.  Roger Gibson 10/24/2016, 11:42 AM

## 2016-10-24 NOTE — Progress Notes (Signed)
Subjective:  UOP 900- crt trending better-  BP overall high- off bipap  Objective Vital signs in last 24 hours: Vitals:   10/24/16 0013 10/24/16 0335 10/24/16 0700 10/24/16 0735  BP: (!) 178/94 (!) 180/83 (!) 167/87   Pulse: (!) 103 (!) 106    Resp: (!) 22 (!) 22 (!) 21   Temp: 98.7 F (37.1 C) 99.2 F (37.3 C) 99.8 F (37.7 C)   TempSrc: Axillary Axillary Axillary   SpO2: 99% 96% 94% 95%  Weight:  93 kg (205 lb 0.4 oz)    Height:       Weight change: 3 kg (6 lb 9.8 oz)  Intake/Output Summary (Last 24 hours) at 10/24/16 0839 Last data filed at 10/24/16 0500  Gross per 24 hour  Intake             1630 ml  Output              900 ml  Net              730 ml    Assessment/Plan: 71 year old black male status post renal transplant with a baseline creatinine between 1.5 and 1.7. 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  He is now admitted with sz- brain imaging showing stroke vs possible brain mass 1.Renal- some acute on chronic renal failure in the setting of hospitalization for possibly stroke/seizure/another brain abnormality with decreased mental status. Blood pressure of anything is high. Urinalysis is unremarkable so not making me think of rejection. pharmacy assistance to get his antirejection medications changed to alternate form and check a Prograf level (pending)  Reasonable UOP and crt down a little 2. Hypertension/volume  -blood pressure is high.unclear if has to do with possible stroke.  Now starting steroids which will not help.  He was on labetalol as outpatient. Currently getting IV PRN here. I am going to start some scheduled IV hydralazine to get BP a little lower but put parameter to hold if sbp less than 140.  Hemoglobin was high here. Could have an element of post transplant erythroocytosis- seems better today  4. Neuro  -  biggest issue at this time. Neurology involved, EEG right now and planning for MRI in the future  to further delineate this brain abnormality- NSG consulted as well  5. ID- is on immune suppression so infectious disease should be in the differential, brain mass due to infection ??. White blood count is slightly up but diff to interpret. Has been started on empiric Zosyn 6. Metabolic acidosis- improved - could stop bicarb drip  7. Hypernatremia-   Starting- stopping bicarb drip may help- if not will need free water per tube 8. Hyperglycemia- per primary      Solyana Nonaka A    Labs: Basic Metabolic Panel:  Recent Labs Lab 10/22/16 0830 10/23/16 0231 10/23/16 1022 10/24/16 0234  NA  --  143 144 147*  K  --  4.6 4.4 4.1  CL  --  112* 112* 111  CO2  --  13* 17* 26  GLUCOSE  --  164* 241* 288*  BUN  --  21* 28* 31*  CREATININE  --  2.10* 2.18* 1.90*  CALCIUM  --  9.4 9.2 9.1  PHOS 3.0 3.6  --  2.0*   Liver Function Tests:  Recent Labs Lab 10/20/2016 1345 10/22/16 0638 10/23/16 0231 10/24/16 0234  AST 81* 209* 187*  --   ALT 26 48 58  --  ALKPHOS 67 63 70  --   BILITOT 1.1 1.7* 1.8*  --   PROT 7.1 6.8 7.8  --   ALBUMIN 4.0 3.7 3.6 2.8*   No results for input(s): LIPASE, AMYLASE in the last 168 hours. No results for input(s): AMMONIA in the last 168 hours. CBC:  Recent Labs Lab 10/10/2016 1345  10/22/16 0638 10/23/16 0231 10/24/16 0728  WBC 7.0  --  8.4 8.1 10.4  NEUTROABS 5.3  --   --  7.1  --   HGB 12.9*  < > 14.3 18.1* 12.1*  HCT 39.1  < > 42.8 55.5* 36.6*  MCV 87.9  --  89.0 89.5 87.6  PLT 158  --  140* 91* 121*  < > = values in this interval not displayed. Cardiac Enzymes:  Recent Labs Lab 10/07/2016 1919 10/07/2016 2325 10/22/16 0638 10/23/16 1022  CKTOTAL 9,711*  --  12,724* 6,006*  CKMB  --   --   --  6.6*  TROPONINI 0.13* 0.17* 0.22* 0.12*   CBG:  Recent Labs Lab 10/04/2016 1620 10/22/16 0743 10/23/16 0755 10/24/16 0720  GLUCAP 111* 81 197* 256*    Iron Studies: No results for input(s): IRON, TIBC, TRANSFERRIN, FERRITIN in the  last 72 hours. Studies/Results: Ct Abdomen Pelvis Wo Contrast  Result Date: 10/22/2016 CLINICAL DATA:  Renal cell cancer, screening for metastasis. Suspected mass the frontal lobe. EXAM: CT CHEST, ABDOMEN AND PELVIS WITHOUT CONTRAST TECHNIQUE: Multidetector CT imaging of the chest, abdomen and pelvis was performed following the standard protocol without IV contrast. COMPARISON:  CT abdomen and pelvis from 12/04/2010. FINDINGS: CT CHEST FINDINGS Cardiovascular: Cardiomegaly without pericardial effusion. There is coronary arteriosclerosis along the circumflex. Mild-to-moderate atherosclerosis of the thoracic aorta without aneurysm. Left subclavian vein stent is noted within the superior mediastinum. Mediastinum/Nodes: No enlarged mediastinal, hilar, or axillary lymph nodes. Thyroid gland, trachea, and esophagus demonstrate no significant findings. Lungs/Pleura: Calcified pleural plaque along the medial aspect of the right hemithorax. Bibasilar dependent and subsegmental atelectasis. No dominant mass. Centrilobular and paraseptal emphysema of the lungs. Musculoskeletal: No aggressive appearing osseous lesions. CT ABDOMEN PELVIS FINDINGS Hepatobiliary: Motion artifacts limit assessment in the upper abdomen. Distended gallbladder containing an air-fluid level within the gallbladder. Trace pneumobilia is also noted in the left hepatic lobe and common bile duct. This in conjunction with small layering stones within the gallbladder could be secondary to passage of a gallstone or instrumentation of the common bile duct. No wall thickening is identified. No definite mass given limitations of a noncontrast study. Pancreas: Unremarkable. No pancreatic ductal dilatation or surrounding inflammatory changes. Spleen: Normal in size without focal abnormality. Adrenals/Urinary Tract: Status post left nephrectomy. The right kidney is likewise not identified but no surgical clips are seen in the retroperitoneum. Transplanted left  pelvic kidney with mild ectasia of the intrarenal collecting system. Contracted bladder with resultant mild mural thickening. Stomach/Bowel: Contracted stomach. Normal small bowel rotation. No small bowel dilatation or obstruction. Normal appendix. Moderate amount of stool is seen within the colon in particular the rectosigmoid. Vascular/Lymphatic: Aortoiliac and branch vessel atherosclerosis. No aneurysm. Mild enlarged abdominal or pelvic lymph nodes. Reproductive: Normal size prostate. Other: .No free air nor free fluid. Nonspecific soft tissue induration and edema involving the left oblique muscles and overlying subcutaneous soft tissues. Musculoskeletal: No aggressive appearing osseous lesions. IMPRESSION: 1. No findings strongly suspicious for metastatic disease. 2. Centrilobular and paraseptal emphysema of the lungs without dominant mass. Mild pleural thickening and calcific plaque along the right lung base is  stable. 3. Air-fluid level within the gallbladder with gallstones and mild intrahepatic and extrahepatic pneumobilia. Findings may represent passage of a gallstone or prior biliary instrumentation. 4. Transplanted left kidney with mild ectasia of the intrarenal collecting system. Mild soft tissue edema and induration, nonspecific of the overlying left lower quadrant soft tissues and flank. 5. Surgically absent native kidneys. Electronically Signed   By: Ashley Royalty M.D.   On: 10/22/2016 23:15   Ct Head Wo Contrast  Result Date: 10/22/2016 CLINICAL DATA:  Left-sided weakness.  Focal neuro deficit EXAM: CT HEAD WITHOUT CONTRAST TECHNIQUE: Contiguous axial images were obtained from the base of the skull through the vertex without intravenous contrast. COMPARISON:  MRI and CT 09/28/2016 FINDINGS: Brain: Ill-defined hypodensity in the right frontal cortex over the convexity unchanged from prior CT. There appears to be effacement of sulci in this area. MRI was indeterminate and incomplete. Negative for  acute hemorrhage. Generalized atrophy with chronic ischemic change in the white matter. Vascular: Negative for hyperdense vessel Skull: Negative Sinuses/Orbits: Mild mucosal edema paranasal sinuses. Negative orbit Other: None IMPRESSION: Ill-defined hypodensity in the high right frontal lobe unchanged. The MRI was not diagnostic. This could represent edema related to infarct or tumor. There is mild mass-effect and effacement of the sulci. Repeat MRI is suggested when the patient is able to complete the study. If MRI is not possible, CT without and with contrast suggested to evaluate for enhancing mass Atrophy with chronic microvascular ischemia. Electronically Signed   By: Franchot Gallo M.D.   On: 10/22/2016 13:32   Ct Chest Wo Contrast  Result Date: 10/22/2016 CLINICAL DATA:  Renal cell cancer, screening for metastasis. Suspected mass the frontal lobe. EXAM: CT CHEST, ABDOMEN AND PELVIS WITHOUT CONTRAST TECHNIQUE: Multidetector CT imaging of the chest, abdomen and pelvis was performed following the standard protocol without IV contrast. COMPARISON:  CT abdomen and pelvis from 12/04/2010. FINDINGS: CT CHEST FINDINGS Cardiovascular: Cardiomegaly without pericardial effusion. There is coronary arteriosclerosis along the circumflex. Mild-to-moderate atherosclerosis of the thoracic aorta without aneurysm. Left subclavian vein stent is noted within the superior mediastinum. Mediastinum/Nodes: No enlarged mediastinal, hilar, or axillary lymph nodes. Thyroid gland, trachea, and esophagus demonstrate no significant findings. Lungs/Pleura: Calcified pleural plaque along the medial aspect of the right hemithorax. Bibasilar dependent and subsegmental atelectasis. No dominant mass. Centrilobular and paraseptal emphysema of the lungs. Musculoskeletal: No aggressive appearing osseous lesions. CT ABDOMEN PELVIS FINDINGS Hepatobiliary: Motion artifacts limit assessment in the upper abdomen. Distended gallbladder containing an  air-fluid level within the gallbladder. Trace pneumobilia is also noted in the left hepatic lobe and common bile duct. This in conjunction with small layering stones within the gallbladder could be secondary to passage of a gallstone or instrumentation of the common bile duct. No wall thickening is identified. No definite mass given limitations of a noncontrast study. Pancreas: Unremarkable. No pancreatic ductal dilatation or surrounding inflammatory changes. Spleen: Normal in size without focal abnormality. Adrenals/Urinary Tract: Status post left nephrectomy. The right kidney is likewise not identified but no surgical clips are seen in the retroperitoneum. Transplanted left pelvic kidney with mild ectasia of the intrarenal collecting system. Contracted bladder with resultant mild mural thickening. Stomach/Bowel: Contracted stomach. Normal small bowel rotation. No small bowel dilatation or obstruction. Normal appendix. Moderate amount of stool is seen within the colon in particular the rectosigmoid. Vascular/Lymphatic: Aortoiliac and branch vessel atherosclerosis. No aneurysm. Mild enlarged abdominal or pelvic lymph nodes. Reproductive: Normal size prostate. Other: .No free air nor free fluid. Nonspecific soft  tissue induration and edema involving the left oblique muscles and overlying subcutaneous soft tissues. Musculoskeletal: No aggressive appearing osseous lesions. IMPRESSION: 1. No findings strongly suspicious for metastatic disease. 2. Centrilobular and paraseptal emphysema of the lungs without dominant mass. Mild pleural thickening and calcific plaque along the right lung base is stable. 3. Air-fluid level within the gallbladder with gallstones and mild intrahepatic and extrahepatic pneumobilia. Findings may represent passage of a gallstone or prior biliary instrumentation. 4. Transplanted left kidney with mild ectasia of the intrarenal collecting system. Mild soft tissue edema and induration, nonspecific of  the overlying left lower quadrant soft tissues and flank. 5. Surgically absent native kidneys. Electronically Signed   By: Ashley Royalty M.D.   On: 10/22/2016 23:15   US Renal  Result Date: 10/23/2016 CLINICAL DATA:  Acute kidney injury. History of left nephrectomy with left renal transplant. EXAM: RENAL / URINARY TRACT ULTRASOUND COMPLETE COMPARISON:  CT of the abdomen and pelvis on 10/22/2016 FINDINGS: Right Kidney: Length: Not identified. Suspect right native kidney is surgically absent. Technologist reports a large right flank scar. Left Kidney: Length: Surgically absent native left kidney. Left renal transplant: Length:  12.4 cm.  Normal renal echogenicity.  Mild hydronephrosis. Bladder: The bladder is decompressed. Study quality is degraded because the patient is unable to suspend respirations for the exam due to BiPAP. IMPRESSION: 1. Left nephrectomy. 2. Right kidney is not identified and is likely surgically absent. 3. Hydronephrosis of the transplant kidney in the left renal fossa. Electronically Signed   By: Nolon Nations M.D.   On: 10/23/2016 18:30   Dg Chest Port 1 View  Result Date: 10/23/2016 CLINICAL DATA:  Acute respiratory distress EXAM: PORTABLE CHEST 1 VIEW COMPARISON:  10/24/2016 CXR, chest CT from 10/22/2016 FINDINGS: Stable cardiomegaly with aortic atherosclerosis. Vascular stent projects over the superior mediastinum along the expected location of the left subclavian vein by same day CT. Bibasilar atelectasis is noted left-greater-than-right. Small left effusion. No overt pulmonary edema. No acute nor suspicious osseous abnormality. IMPRESSION: 1. Bibasilar atelectasis left greater than right with small left pleural effusion. No overt pulmonary edema. 2. Stable cardiomegaly with aortic atherosclerosis. Electronically Signed   By: Ashley Royalty M.D.   On: 10/23/2016 01:42   Medications: Infusions: . lacosamide (VIMPAT) IV Stopped (10/24/16 0050)  . levETIRAcetam Stopped (10/23/16  2314)  . mycophenolate (CELLCEPT) IV 250 mg (10/24/16 0547)  . piperacillin-tazobactam (ZOSYN)  IV 3.375 g (10/24/16 0400)    Scheduled Medications: . cholecalciferol  2,000 Units Oral Daily  . dexamethasone  4 mg Intravenous Q6H  . [START ON 11/19/2016] Influenza vac split quadrivalent PF  0.5 mL Intramuscular Tomorrow-1000  . ipratropium  0.5 mg Nebulization TID  . levalbuterol  0.63 mg Nebulization TID  . pravastatin  20 mg Oral q1800  . sodium chloride flush  3 mL Intravenous Q12H  . tacrolimus  4 mg Oral BID   Or  . tacrolimus  4 mg Sublingual BID  . tamsulosin  0.4 mg Oral QPC supper    have reviewed scheduled and prn medications.  Physical Exam: General: slowly opens eyes to stimuli- no commands  And non verbal  Heart: RRR Lungs: poor effort Abdomen: soft, non tender Extremities: minimal edema    10/24/2016,8:39 AM  LOS: 3 days

## 2016-10-24 NOTE — Progress Notes (Addendum)
Subjective: Now off of bipap. No significant changes neurologically. Started on LT, but currently leads are obscured by artifact.   Exam: Vitals:   10/24/16 0700 10/24/16 0735  BP: (!) 167/87   Pulse:    Resp: (!) 21   Temp: 99.8 F (37.7 C)   SpO2: 94% 95%   Gen: In bed, NAD Resp: non-labored breathing, no acute distress Abd: soft, nt  Neuro: MS: Awake, alert, answers questions and follows commands readily on the right, not as well on the left.. Some perseveration, still confused. He has significant neglect.  CN: Visual fields are full, extra ocular movements are intact, no facial weakness with grimace.  Motor: Full strength on the right, on the left he is able to raise his left arm against gravity if repeatedly encouraged, but not reliably on his own. Does not left the left leg.  Sensory: He does not extinguish to double simultaneous stimulation  Pertinent Labs: Creatinine 2.1 GFR 33  Impression: 71 year old male presented with new onset seizures with persistent postictal left-sided hemiparesis. 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 LTM EEG to assess for recurrent seizures with report pending, but I suspect continued epileptic irritability may  playing a role in his continued symptoms and further treatment may improve symptoms.   Also possible is that he had prolonged status epilepticus prior to arrival with persistent injury due to this.   Recommendations: 1) decrease Keppra to 750 mg twice a day 2) continue vimpat 200mg  BID.  3) Will hold EEG today to get MRI.  4) Repeat MRI brain w/wo contrast  5) Add depakote today  Roland Rack, MD [facility] [phone]  If 7pm- 7am, please page neurology on call as listed in [employer].

## 2016-10-24 NOTE — Progress Notes (Signed)
PROGRESS NOTE    Roger Gibson  VQQ:[id number] DOB: April 03, 1945 DOA: 10/20/2016 PCP: [name], MD   Brief Narrative:  Roger Gibson is a 71 y.o. male with medical history significant for hypertension, chronic combined systolic/diastolic CHF, history of right kidney cancer, end-stage renal disease status post renal transplantation, that presented to the emergency department with left-sided weakness. History is obtained from the patient's wife at the bedside. He had reportedly been in his usual state last night, driving them to dinner and back home. He went to bed as usual around 11 PM, was noted to sleep in which is not unusual for him, but then called out to his wife at approximately 1 PM, reporting difficulty getting out of bed. She had noted earlier that he was sleeping with his left leg hanging off the bed, and she was not strong enough to help stand him up. A neighbor came to help get the patient up, at which point he was noted to have dried blood caked about his mouth, and EMS was called. Patient complained of some abdominal discomfort with EMS, but had not been expressing any recent complaints aside from some minor knee pain.  Upon arrival to the ED, patient is found to be afebrile, saturating well on room air, slightly tachypneic, and with vitals otherwise stable. Noncontrast head CT reveals a small right frontal lobe abnormality of uncertain etiology. This was followed by MRI brain which was aborted early due to patient intolerance, but notable for abnormal signal at the right superior frontal gyrus consistent with vasogenic edema. Neurology was consulted and patient has undergone EEG. He was treated with a liter of normal saline in the ED. He was admitted to the telemetry unit for ongoing evaluation and management of acute neurologic deficits with vasogenic edema noted on a limited MRI and suspected Seizure. Neurology placed patient on Antiepileptics. Troponins increased and Discussed  Case with Cardiology who recommended to obtain an ECHO and outpatient stress test. Currently still don't know what frontal mass with vasogenic edema and is being worked up.   Patient decompensated and was transferred to SDU on 9/28-9/29 and placed on BiPAP. Remained on BiPAP all day yesterday as he remained lethargic. 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 Neurology planning on doing Overnight EEG. I discussed the case with Neurosurgery PA, and patient was placed on their list to see. Will need MRI and may need it under sedation.   Patient was taken off of BiPAP and was on LTM EEG which showed evidence of a structural abnormality in the right hemisphere, with epileptogenic potential and suggestion of acute-subacute injury. There is periodic  pattern of sharp waves ("PLEDs"). This is indicative of a high risk of seizure occurrence, but no distinct seizures are seen. Neurology decreased Keppra and added Depakote today. Patient still go for MRI of Brain.   Assessment & Plan:   Principal Problem:   Acute focal neurological deficit Active Problems:   Hypokalemia   History of renal transplant   Immunosuppressed status (HCC)   Acute kidney injury ([place])   CKD (chronic kidney disease), stage III (HCC)   Elevated troponin   Vasogenic brain edema (HCC)   Acute encephalopathy   Chronic combined systolic and diastolic CHF (congestive heart failure) (HCC)   Metabolic acidosis   Acute respiratory failure (HCC)   SIRS (systemic inflammatory response syndrome) (HCC)   Pneumobilia  1. Acute neurologic event suspect Seizure with Todd's Paralysis in the setting  of an ill-defined hypodensity in the Frontal Lobe with slight vasogenic brain edema  - Pt presented with left-sided weakness after he was last seen normal at 23:00 the night before  - Dried blood about the mouth raised suspicion for seizure and EEG was performed,  - EEG recorded  evidence of orbital dysfunction in the right posterior quadrant, suggestive of irritability. No seizure was recorded - CT Code Stroke with Small RIGHT frontal lobe infarct versus venous hypertension and cortical vein thrombosis versus metastasis.Moderate atrophy and mild chronic small vessel ischemic disease;  - MRI limited as was aborted early d/t pt intolerance, but revealed abnormal signal at the right superior frontal gyrus corresponding to the CT finding, but compatible with vasogenic edema rather than acute or subacute infarct. - MRA done but showed Severely motion degraded examination without internal carotid artery or basilar artery occlusion. Nondiagnostic for assessment of emergent large vessel occlusion. - Head CT ordered by Neurology this Afternoon showed Ill-defined hypodensity in the high right frontal lobe unchanged. The MRI was not diagnostic. This could represent edema related to infarct or tumor. There is mild mass-effect and effacement of the sulci.  - I discussed the Case with Neurology Dr. Leonel Ramsay personally and he is going to be ordering steroids for suspected vasogenic edema - Neurology ordered IV Fosphenytoin 1500 mg and IV Keppra 1000 mg q12h and was on Phenytoin; Phenytoin changed to Lacosamide 200 mg IV q12h due to interaction with Anti-Rejection Meds -Keppra Dose was reduced to IV 750 mg BID and Valparoate was added with initial 2000 mg dose and then 500 mg IV q8h  - Neurology consulted will follow-up on recommendations  - Unable to MRI/MRA as patient decompensated and ended up on BiPAP - Continue seizure precautions, continue frequent neuro checks, supportive care  - SLP done and recommending Dysphagia 1 Puree Diet with Thin Liquids - Per my discussion with Dr. Tobias Alexander will need to rule out Metastasis and recommended ordering CT Chest/Abd/Pelvis -CT Chest/Abd/Pelvis done and showed no evidence of metastatic disease -Checking UDS -Neurology ordering Overnight EEG and  LTM EEG showed evidence of a structural abnormality in the right hemisphere, with epileptogenic potential and suggestion of acute-subacute injury. There is periodic  pattern of sharp waves ("PLEDs"). This is indicative of a high risk of seizure occurrence, but no distinct seizures are seen.  -Still awaiting MRIN fo Brain to be done.   2. ESRD s/p Renal Transplantation - SCr is 2.29 on admission, up from 1.5 in August 2018  - He has Hx of right renal cancer and ESRD, now with transplant kidney  - Nephrology Dr. Moshe Cipro consulted for further evaluation and Recc's - Low-dose Prednisone 5 mg po Daily, Mycophenolate 180 mg po BID, and Tacrolimus 4 mg po BID changed to IV Forms but don't have IV Tacrolimus so will give Sublingually  - Prograf Level pending  -CT Scan showed Transplanted left kidney with mild ectasia of the intrarenal collecting system. Mild soft tissue edema and induration, nonspecific of the overlying left lower quadrant soft tissues and flank. Surgically absent native kidneys.  3. Acute kidney superimposed on CKD stage III  - SCr is 2.29 on admission, up from 1.5 in August 2018  - He was given a liter of NS in ED and was continued on IVF hydration overnight; IVF Changed to Bicarb gtt - BUN/Cr went from 23/2.29 -> 18/1.80 -> 28/2.18 -> 27/1.90 - Continue immunosuppressants as above - Obtain CT Chest/Abd/Pelvis w/o Contrast as below - Renal U/S showed Left  nephrectomy. Right kidney is not identified and is likely surgically absent. Hydronephrosis of the transplant kidney in the left renal fossa - U/A appeared to have no infection; Urine Cx pending  - Repeat CMP in AM  4. Hypokalemia  - Serum potassium 3.3 on admission; Improved to 3.8 - Continue to Monitor and Replete as Necessary  - Repeat CMP in AM  5. Chronic Combined Systolic/Diastolic CHF  - Pt appeared hypovolemic on arrival and was treated with 1 L NS in ED  - Hold Lasix initially,  - Follow strict I/O's and  daily wts,  -Patient is +1.8788 Liters  - Resume beta-blocker as tolerated  - C/w IVF rehydration with BiCarb Gtt   6. Hypertension  - BP at goal in ED, but increased today   - Managed at home with labetalol - Will use prn labetalol IVP's until passes swallow screen - Nephrology starting scheduled Hydralazine to hold if SBP <140    7. Elevated Troponin - Troponin elevated to 0.10 on admission and increased to 0.22 along with CK which was 12,724; Repeat Trended down - No anginal complaints to suggest ACS  - Plan to continue cardiac monitoring - Ordered ECHOCardiogram and showed EF of 55-60% and Grade 1 DD - Discussed Case with Cardiologist Dr. Oval Linsey who evaluated patient's EKG and recommends not to continue to trend troponin and recommends outpatient Stress Test and Evaluation - Patient scheduled for follow at D/C with Cardiologist   8. Metabolic Acidosis, improved -Unclear Etiology -Bicarb gtt stopped -Repeat CMP in AM  9. Acute Respiratory Failure requiring NIPPV, improved -Patient decompensated requiring BiPAP; ? Related to Seizures vs other etiology  -ABG looked ok and showed 7.379/22.8/88.5/11.0/96.3% -CT Scan showed Centrilobular and paraseptal emphysema of the lungs without dominant mass. Mild pleural thickening and calcific plaque along the right lung base is stable -Attempt to Wean BiPAP -Repeat CXR in AM  10. SIRS -Patient was mildly Febrile, Tachycardic, Tachypenic  -Started Empiric Abx with Zosyn -Follow Cx Data and order Blood Cx for Fever >101 -Ordered Urinalysis and Urine Cx showed multiple species present  11. Mild Intrahepatic/Extrahepatic Pneumobilia -? Related to Gall stone passing; No Abdominal Pain -Will discuss with General Surgery  12. Hypernatremia -Mild -Stopped Fluids -Repeat CMP in AM  13. Hyperglycemia -Check HbA1c -Likely reactive in the setting of IV Decadron  -Added Sensitive Novolog SSI q4h as patient is NPO -Continue to monitor  CBG's  DVT prophylaxis: SCDs Code Status: FULL CODE Family Communication: Discussed with wife at bedside Disposition Plan: Remain Inpatient  Consultants:   Neurology Dr. Leonel Ramsay  Discussed Case with Cardiologist Dr. Oval Linsey who recommended outpatient ischemic evaluation/stress test  Nephrology Dr. Corliss Parish  Neurosurgery    Procedures:  ECHOCARDIOGRAM ------------------------------------------------------------------- Study Conclusions  - Left ventricle: The cavity size was normal. Wall thickness was   increased in a pattern of moderate LVH. Systolic function was   normal. The estimated ejection fraction was in the range of 55%   to 60%. Wall motion was normal; there were no regional wall   motion abnormalities. Doppler parameters are consistent with   abnormal left ventricular relaxation (grade 1 diastolic   dysfunction). - Aortic valve: Mildly calcified annulus. Trileaflet; normal   thickness leaflets. - Mitral valve: There was mild regurgitation. - Left atrium: The atrium was mildly dilated.   EEG EEG Abnormalities: 1) lateralized periodic discharges in the right posterior quadrant with delta wave morphology 2) utilize irregular slow activity  Clinical Interpretation: This EEG recorded evidence of orbital dysfunction  in the right posterior quadrant, suggestive of irritability. No seizure was recorded.   Antimicrobials: Anti-infectives    Start     Dose/Rate Route Frequency Ordered Stop   10/23/16 0800  piperacillin-tazobactam (ZOSYN) IVPB 3.375 g     3.375 g 12.5 mL/hr over 240 Minutes Intravenous Every 8 hours 10/23/16 0735       Subjective: Seen and examined at bedside and was off of BiPAP. He was very confused and would answer questions but knew he was in [facility]. No other concerns or complaints.   Objective: Vitals:   10/24/16 1100 10/24/16 1417 10/24/16 1600 10/24/16 2005  BP: (!) 166/92  (!) 167/88 (!) 174/94  Pulse:    100 (!) 104  Resp: 19  (!) 22   Temp: 99.6 F (37.6 C)  98.6 F (37 C) 98.2 F (36.8 C)  TempSrc: Axillary  Axillary Oral  SpO2: 97% 97% 99%   Weight:      Height:        Intake/Output Summary (Last 24 hours) at 10/24/16 2116 Last data filed at 10/24/16 1648  Gross per 24 hour  Intake             1630 ml  Output             1225 ml  Net              405 ml   Filed Weights   10/23/16 0500 10/24/16 0335  Weight: 90 kg (198 lb 6.6 oz) 93 kg (205 lb 0.4 oz)    Examination: Physical Exam:  Constitutional: WN/WD AAM who appears calm in NAD Eyes: Sclerae anicteric. Lids normal ENMT: External ears and nose appear normal. Grossly normal hearing Neck: Supple with no JVD Respiratory: Diminished with no appreciable wheezing/rales Cardiovascular: RRR; Mild edema Abdomen: Soft, NT, Distended due to body habitus. GU: Deferred Musculoskeletal: No contractures. No cyanosis Skin: Warm and dry. Patient has diffuse itching. No rashes appreciated Neurologic: CN 2-12 grossly intact. Does not move left leg and left arm drifts Psychiatric: Awake and alert but has impaired judgement and insight. Still confused  Data Reviewed: I have personally reviewed following labs and imaging studies  CBC:  Recent Labs Lab 10/20/2016 1345 10/06/2016 1402 10/22/16 0638 10/23/16 0231 10/24/16 0728  WBC 7.0  --  8.4 8.1 10.4  NEUTROABS 5.3  --   --  7.1  --   HGB 12.9* 13.9 14.3 18.1* 12.1*  HCT 39.1 41.0 42.8 55.5* 36.6*  MCV 87.9  --  89.0 89.5 87.6  PLT 158  --  140* 91* 606*   Basic Metabolic Panel:  Recent Labs Lab 10/22/16 0638 10/22/16 0830 10/23/16 0231 10/23/16 1022 10/24/16 0234 10/24/16 0728  NA 142  --  143 144 147* 147*  K 3.8  --  4.6 4.4 4.1 3.8  CL 112*  --  112* 112* 111 109  CO2 14*  --  13* 17* 26 24  GLUCOSE 97  --  164* 241* 288* 285*  BUN 18  --  21* 28* 31* 27*  CREATININE 1.80*  --  2.10* 2.18* 1.90* 1.90*  CALCIUM 9.1  --  9.4 9.2 9.1 9.1  MG  --  1.9 2.1  --    --  2.1  PHOS  --  3.0 3.6  --  2.0* 1.8*   GFR: Estimated Creatinine Clearance: 40.3 mL/min (A) (by C-G formula based on SCr of 1.9 mg/dL (H)). Liver Function Tests:  Recent Labs Lab 10/24/2016  1345 10/22/16 5277 10/23/16 0231 10/24/16 0234 10/24/16 0728  AST 81* 209* 187*  --  110*  ALT 26 48 58  --  52  ALKPHOS 67 63 70  --  55  BILITOT 1.1 1.7* 1.8*  --  1.2  PROT 7.1 6.8 7.8  --  6.5  ALBUMIN 4.0 3.7 3.6 2.8* 2.9*   No results for input(s): LIPASE, AMYLASE in the last 168 hours. No results for input(s): AMMONIA in the last 168 hours. Coagulation Profile:  Recent Labs Lab 10/24/2016 1345  INR 1.08   Cardiac Enzymes:  Recent Labs Lab 10/15/2016 1919 10/23/2016 2325 10/22/16 0638 10/23/16 1022  CKTOTAL 9,711*  --  12,724* 6,006*  CKMB  --   --   --  6.6*  TROPONINI 0.13* 0.17* 0.22* 0.12*   BNP (last 3 results) No results for input(s): PROBNP in the last 8760 hours. HbA1C: No results for input(s): HGBA1C in the last 72 hours. CBG:  Recent Labs Lab 10/23/16 0755 10/24/16 0720 10/24/16 1126 10/24/16 1645 10/24/16 1923  GLUCAP 197* 256* 273* 183* 166*   Lipid Profile: No results for input(s): CHOL, HDL, LDLCALC, TRIG, CHOLHDL, LDLDIRECT in the last 72 hours. Thyroid Function Tests: No results for input(s): TSH, T4TOTAL, FREET4, T3FREE, THYROIDAB in the last 72 hours. Anemia Panel: No results for input(s): VITAMINB12, FOLATE, FERRITIN, TIBC, IRON, RETICCTPCT in the last 72 hours. Sepsis Labs: No results for input(s): PROCALCITON, LATICACIDVEN in the last 168 hours.  Recent Results (from the past 240 hour(s))  MRSA PCR Screening     Status: None   Collection Time: 10/23/16  1:58 AM  Result Value Ref Range Status   MRSA by PCR NEGATIVE NEGATIVE Final    Comment:        The GeneXpert MRSA Assay (FDA approved for NASAL specimens only), is one component of a comprehensive MRSA colonization surveillance program. It is not intended to diagnose  MRSA infection nor to guide or monitor treatment for MRSA infections.   Culture, Urine     Status: Abnormal   Collection Time: 10/23/16 11:12 AM  Result Value Ref Range Status   Specimen Description URINE, RANDOM  Final   Special Requests NONE  Final   Culture MULTIPLE SPECIES PRESENT, SUGGEST RECOLLECTION (A)  Final   Report Status 10/24/2016 FINAL  Final     Radiology Studies: Ct Abdomen Pelvis Wo Contrast  Result Date: 10/22/2016 CLINICAL DATA:  Renal cell cancer, screening for metastasis. Suspected mass the frontal lobe. EXAM: CT CHEST, ABDOMEN AND PELVIS WITHOUT CONTRAST TECHNIQUE: Multidetector CT imaging of the chest, abdomen and pelvis was performed following the standard protocol without IV contrast. COMPARISON:  CT abdomen and pelvis from 12/04/2010. FINDINGS: CT CHEST FINDINGS Cardiovascular: Cardiomegaly without pericardial effusion. There is coronary arteriosclerosis along the circumflex. Mild-to-moderate atherosclerosis of the thoracic aorta without aneurysm. Left subclavian vein stent is noted within the superior mediastinum. Mediastinum/Nodes: No enlarged mediastinal, hilar, or axillary lymph nodes. Thyroid gland, trachea, and esophagus demonstrate no significant findings. Lungs/Pleura: Calcified pleural plaque along the medial aspect of the right hemithorax. Bibasilar dependent and subsegmental atelectasis. No dominant mass. Centrilobular and paraseptal emphysema of the lungs. Musculoskeletal: No aggressive appearing osseous lesions. CT ABDOMEN PELVIS FINDINGS Hepatobiliary: Motion artifacts limit assessment in the upper abdomen. Distended gallbladder containing an air-fluid level within the gallbladder. Trace pneumobilia is also noted in the left hepatic lobe and common bile duct. This in conjunction with small layering stones within the gallbladder could be secondary to passage of a  gallstone or instrumentation of the common bile duct. No wall thickening is identified. No definite  mass given limitations of a noncontrast study. Pancreas: Unremarkable. No pancreatic ductal dilatation or surrounding inflammatory changes. Spleen: Normal in size without focal abnormality. Adrenals/Urinary Tract: Status post left nephrectomy. The right kidney is likewise not identified but no surgical clips are seen in the retroperitoneum. Transplanted left pelvic kidney with mild ectasia of the intrarenal collecting system. Contracted bladder with resultant mild mural thickening. Stomach/Bowel: Contracted stomach. Normal small bowel rotation. No small bowel dilatation or obstruction. Normal appendix. Moderate amount of stool is seen within the colon in particular the rectosigmoid. Vascular/Lymphatic: Aortoiliac and branch vessel atherosclerosis. No aneurysm. Mild enlarged abdominal or pelvic lymph nodes. Reproductive: Normal size prostate. Other: .No free air nor free fluid. Nonspecific soft tissue induration and edema involving the left oblique muscles and overlying subcutaneous soft tissues. Musculoskeletal: No aggressive appearing osseous lesions. IMPRESSION: 1. No findings strongly suspicious for metastatic disease. 2. Centrilobular and paraseptal emphysema of the lungs without dominant mass. Mild pleural thickening and calcific plaque along the right lung base is stable. 3. Air-fluid level within the gallbladder with gallstones and mild intrahepatic and extrahepatic pneumobilia. Findings may represent passage of a gallstone or prior biliary instrumentation. 4. Transplanted left kidney with mild ectasia of the intrarenal collecting system. Mild soft tissue edema and induration, nonspecific of the overlying left lower quadrant soft tissues and flank. 5. Surgically absent native kidneys. Electronically Signed   By: Ashley Royalty M.D.   On: 10/22/2016 23:15   Ct Chest Wo Contrast  Result Date: 10/22/2016 CLINICAL DATA:  Renal cell cancer, screening for metastasis. Suspected mass the frontal lobe. EXAM: CT  CHEST, ABDOMEN AND PELVIS WITHOUT CONTRAST TECHNIQUE: Multidetector CT imaging of the chest, abdomen and pelvis was performed following the standard protocol without IV contrast. COMPARISON:  CT abdomen and pelvis from 12/04/2010. FINDINGS: CT CHEST FINDINGS Cardiovascular: Cardiomegaly without pericardial effusion. There is coronary arteriosclerosis along the circumflex. Mild-to-moderate atherosclerosis of the thoracic aorta without aneurysm. Left subclavian vein stent is noted within the superior mediastinum. Mediastinum/Nodes: No enlarged mediastinal, hilar, or axillary lymph nodes. Thyroid gland, trachea, and esophagus demonstrate no significant findings. Lungs/Pleura: Calcified pleural plaque along the medial aspect of the right hemithorax. Bibasilar dependent and subsegmental atelectasis. No dominant mass. Centrilobular and paraseptal emphysema of the lungs. Musculoskeletal: No aggressive appearing osseous lesions. CT ABDOMEN PELVIS FINDINGS Hepatobiliary: Motion artifacts limit assessment in the upper abdomen. Distended gallbladder containing an air-fluid level within the gallbladder. Trace pneumobilia is also noted in the left hepatic lobe and common bile duct. This in conjunction with small layering stones within the gallbladder could be secondary to passage of a gallstone or instrumentation of the common bile duct. No wall thickening is identified. No definite mass given limitations of a noncontrast study. Pancreas: Unremarkable. No pancreatic ductal dilatation or surrounding inflammatory changes. Spleen: Normal in size without focal abnormality. Adrenals/Urinary Tract: Status post left nephrectomy. The right kidney is likewise not identified but no surgical clips are seen in the retroperitoneum. Transplanted left pelvic kidney with mild ectasia of the intrarenal collecting system. Contracted bladder with resultant mild mural thickening. Stomach/Bowel: Contracted stomach. Normal small bowel rotation. No  small bowel dilatation or obstruction. Normal appendix. Moderate amount of stool is seen within the colon in particular the rectosigmoid. Vascular/Lymphatic: Aortoiliac and branch vessel atherosclerosis. No aneurysm. Mild enlarged abdominal or pelvic lymph nodes. Reproductive: Normal size prostate. Other: .No free air nor free fluid. Nonspecific soft tissue induration  and edema involving the left oblique muscles and overlying subcutaneous soft tissues. Musculoskeletal: No aggressive appearing osseous lesions. IMPRESSION: 1. No findings strongly suspicious for metastatic disease. 2. Centrilobular and paraseptal emphysema of the lungs without dominant mass. Mild pleural thickening and calcific plaque along the right lung base is stable. 3. Air-fluid level within the gallbladder with gallstones and mild intrahepatic and extrahepatic pneumobilia. Findings may represent passage of a gallstone or prior biliary instrumentation. 4. Transplanted left kidney with mild ectasia of the intrarenal collecting system. Mild soft tissue edema and induration, nonspecific of the overlying left lower quadrant soft tissues and flank. 5. Surgically absent native kidneys. Electronically Signed   By: Ashley Royalty M.D.   On: 10/22/2016 23:15   US Renal  Result Date: 10/23/2016 CLINICAL DATA:  Acute kidney injury. History of left nephrectomy with left renal transplant. EXAM: RENAL / URINARY TRACT ULTRASOUND COMPLETE COMPARISON:  CT of the abdomen and pelvis on 10/22/2016 FINDINGS: Right Kidney: Length: Not identified. Suspect right native kidney is surgically absent. Technologist reports a large right flank scar. Left Kidney: Length: Surgically absent native left kidney. Left renal transplant: Length:  12.4 cm.  Normal renal echogenicity.  Mild hydronephrosis. Bladder: The bladder is decompressed. Study quality is degraded because the patient is unable to suspend respirations for the exam due to BiPAP. IMPRESSION: 1. Left nephrectomy. 2.  Right kidney is not identified and is likely surgically absent. 3. Hydronephrosis of the transplant kidney in the left renal fossa. Electronically Signed   By: Nolon Nations M.D.   On: 10/23/2016 18:30   Dg Chest Port 1 View  Result Date: 10/24/2016 CLINICAL DATA:  Shortness of breath. EXAM: PORTABLE CHEST 1 VIEW COMPARISON:  Yesterday. FINDINGS: Stable enlargement of the cardiac silhouette and diffuse prominence of the interstitial markings. Small amount of linear density at the left lung base with improvement. Small amount of left pleural fluid or prominent epicardial fat pad. Stable left subclavian vein stent. Unremarkable bones. IMPRESSION: 1. Decreased left basilar atelectasis. 2. Stable cardiomegaly and chronic interstitial lung disease. Electronically Signed   By: Claudie Revering M.D.   On: 10/24/2016 10:13   Dg Chest Port 1 View  Result Date: 10/23/2016 CLINICAL DATA:  Acute respiratory distress EXAM: PORTABLE CHEST 1 VIEW COMPARISON:  10/24/2016 CXR, chest CT from 10/22/2016 FINDINGS: Stable cardiomegaly with aortic atherosclerosis. Vascular stent projects over the superior mediastinum along the expected location of the left subclavian vein by same day CT. Bibasilar atelectasis is noted left-greater-than-right. Small left effusion. No overt pulmonary edema. No acute nor suspicious osseous abnormality. IMPRESSION: 1. Bibasilar atelectasis left greater than right with small left pleural effusion. No overt pulmonary edema. 2. Stable cardiomegaly with aortic atherosclerosis. Electronically Signed   By: Ashley Royalty M.D.   On: 10/23/2016 01:42   Scheduled Meds: . cholecalciferol  2,000 Units Oral Daily  . dexamethasone  4 mg Intravenous Q6H  . hydrALAZINE  10 mg Intravenous Q6H  . [START ON 10/30/2016] Influenza vac split quadrivalent PF  0.5 mL Intramuscular Tomorrow-1000  . insulin aspart  0-9 Units Subcutaneous Q4H  . ipratropium  0.5 mg Nebulization TID  . levalbuterol  0.63 mg Nebulization  TID  . pravastatin  20 mg Oral q1800  . sodium chloride flush  3 mL Intravenous Q12H  . tacrolimus  4 mg Oral BID   Or  . tacrolimus  4 mg Sublingual BID  . tamsulosin  0.4 mg Oral QPC supper   Continuous Infusions: . lacosamide (VIMPAT) IV Stopped (  10/24/16 1148)  . levETIRAcetam Stopped (10/24/16 0924)  . mycophenolate (CELLCEPT) IV 250 mg (10/24/16 2014)  . piperacillin-tazobactam (ZOSYN)  IV 3.375 g (10/24/16 2014)  . valproate sodium      LOS: 3 days   Kerney Elbe, DO [facility] Pager [phone]  If 7PM-7AM, please contact night-coverage [email or web address] Password [email or web address] 10/24/2016, 9:16 PM

## 2016-10-24 NOTE — Clinical Social Work Note (Signed)
CSW acknowledges SNF consult. PT/OT have been unable to evaluate him yet. CSW will follow for recommendations.  Dayton Scrape, [place]

## 2016-10-24 NOTE — Progress Notes (Signed)
OT Cancellation Note  Patient Details Name: Roger Gibson MRN: [id number] DOB: 1945/10/25   Cancelled Treatment:    Reason Eval/Treat Not Completed: Other (comment). Spoke with RN. Pt on continuous EEG for 24 hours and awaiting MRI (will be sedated for MRI). RN requests OT hold evaluation this date.   Norman Herrlich, MS OTR/L  Pager: [place] A Leshae Mcclay 10/24/2016, 11:44 AM

## 2016-10-24 NOTE — Progress Notes (Signed)
PT Cancellation Note  Patient Details Name: CURRY SEEFELDT MRN: [id number] DOB: 08-02-1945   Cancelled Treatment:    Reason Eval/Treat Not Completed: Patient at procedure or test/unavailable;Medical issues which prohibited therapy Spoke with RN. Pt on continuous EEG for 24 hours and awaiting MRI (will be sedated for MRI). RN requests PT hold evaluation this date.  Despina Pole 10/24/2016, 2:35 PM Carita Pian. Sanjuana Kava, [place] Pager [phone]

## 2016-10-25 DIAGNOSIS — Z87891 Personal history of nicotine dependence: Secondary | ICD-10-CM

## 2016-10-25 DIAGNOSIS — Z85528 Personal history of other malignant neoplasm of kidney: Secondary | ICD-10-CM

## 2016-10-25 DIAGNOSIS — Z91041 Radiographic dye allergy status: Secondary | ICD-10-CM

## 2016-10-25 DIAGNOSIS — G939 Disorder of brain, unspecified: Secondary | ICD-10-CM

## 2016-10-25 DIAGNOSIS — R740 Nonspecific elevation of levels of transaminase and lactic acid dehydrogenase [LDH]: Secondary | ICD-10-CM

## 2016-10-25 DIAGNOSIS — R29818 Other symptoms and signs involving the nervous system: Secondary | ICD-10-CM

## 2016-10-25 DIAGNOSIS — R7401 Elevation of levels of liver transaminase levels: Secondary | ICD-10-CM

## 2016-10-25 DIAGNOSIS — D696 Thrombocytopenia, unspecified: Secondary | ICD-10-CM

## 2016-10-25 DIAGNOSIS — R0603 Acute respiratory distress: Secondary | ICD-10-CM

## 2016-10-25 DIAGNOSIS — E785 Hyperlipidemia, unspecified: Secondary | ICD-10-CM

## 2016-10-25 DIAGNOSIS — I130 Hypertensive heart and chronic kidney disease with heart failure and stage 1 through stage 4 chronic kidney disease, or unspecified chronic kidney disease: Secondary | ICD-10-CM

## 2016-10-25 LAB — COMPREHENSIVE METABOLIC PANEL
ALK PHOS: 69 U/L (ref 38–126)
ALT: 60 U/L (ref 17–63)
ANION GAP: 13 (ref 5–15)
AST: 145 U/L — ABNORMAL HIGH (ref 15–41)
Albumin: 2.9 g/dL — ABNORMAL LOW (ref 3.5–5.0)
BILIRUBIN TOTAL: 0.9 mg/dL (ref 0.3–1.2)
BUN: 30 mg/dL — ABNORMAL HIGH (ref 6–20)
CALCIUM: 10 mg/dL (ref 8.9–10.3)
CO2: 24 mmol/L (ref 22–32)
Chloride: 115 mmol/L — ABNORMAL HIGH (ref 101–111)
Creatinine, Ser: 1.64 mg/dL — ABNORMAL HIGH (ref 0.61–1.24)
GFR, EST AFRICAN AMERICAN: 47 mL/min — AB (ref >60)
GFR, EST NON AFRICAN AMERICAN: 40 mL/min — AB (ref >60)
GLUCOSE: 124 mg/dL — AB (ref 65–99)
POTASSIUM: 3.8 mmol/L (ref 3.5–5.1)
Sodium: 152 mmol/L — ABNORMAL HIGH (ref 135–145)
TOTAL PROTEIN: 7.3 g/dL (ref 6.5–8.1)

## 2016-10-25 LAB — CBC WITH DIFFERENTIAL/PLATELET
BASOS ABS: 0 10*3/uL (ref 0.0–0.1)
BASOS PCT: 0 %
Eosinophils Absolute: 0 10*3/uL (ref 0.0–0.7)
Eosinophils Relative: 0 %
HEMATOCRIT: 43.6 % (ref 39.0–52.0)
HEMOGLOBIN: 14.6 g/dL (ref 13.0–17.0)
LYMPHS ABS: 0.5 10*3/uL — AB (ref 0.7–4.0)
LYMPHS PCT: 5 %
MCH: 29.3 pg (ref 26.0–34.0)
MCHC: 33.5 g/dL (ref 30.0–36.0)
MCV: 87.6 fL (ref 78.0–100.0)
MONOS PCT: 5 %
Monocytes Absolute: 0.5 10*3/uL (ref 0.1–1.0)
NEUTROS ABS: 9.5 10*3/uL — AB (ref 1.7–7.7)
Neutrophils Relative %: 90 %
Platelets: 107 10*3/uL — ABNORMAL LOW (ref 150–400)
RBC: 4.98 MIL/uL (ref 4.22–5.81)
RDW: 14.4 % (ref 11.5–15.5)
WBC: 10.5 10*3/uL (ref 4.0–10.5)

## 2016-10-25 LAB — MAGNESIUM: MAGNESIUM: 2.1 mg/dL (ref 1.7–2.4)

## 2016-10-25 LAB — GLUCOSE, CAPILLARY
GLUCOSE-CAPILLARY: 179 mg/dL — AB (ref 65–99)
GLUCOSE-CAPILLARY: 122 mg/dL — AB (ref 65–99)
GLUCOSE-CAPILLARY: 156 mg/dL — AB (ref 65–99)
Glucose-Capillary: 174 mg/dL — ABNORMAL HIGH (ref 65–99)
Glucose-Capillary: 176 mg/dL — ABNORMAL HIGH (ref 65–99)
Glucose-Capillary: 183 mg/dL — ABNORMAL HIGH (ref 65–99)

## 2016-10-25 LAB — URINE DRUGS OF ABUSE SCREEN W ALC, ROUTINE (REF LAB)
AMPHETAMINES, URINE: NEGATIVE ng/mL
BENZODIAZEPINE QUANT UR: NEGATIVE ng/mL
Barbiturate, Ur: NEGATIVE ng/mL
CANNABINOID QUANT UR: NEGATIVE ng/mL
COCAINE (METAB.): NEGATIVE ng/mL
ETHANOL U, QUAN: NEGATIVE %
METHADONE SCREEN, URINE: NEGATIVE ng/mL
Opiate Quant, Ur: NEGATIVE ng/mL
PHENCYCLIDINE, UR: NEGATIVE ng/mL
Propoxyphene, Urine: NEGATIVE ng/mL

## 2016-10-25 LAB — TACROLIMUS LEVEL: Tacrolimus (FK506) - LabCorp: 10.3 ng/mL (ref 2.0–20.0)

## 2016-10-25 LAB — PHOSPHORUS: PHOSPHORUS: 1.5 mg/dL — AB (ref 2.5–4.6)

## 2016-10-25 MED ORDER — DEXTROSE 5 % IV SOLN
INTRAVENOUS | Status: DC
  Administered 2016-10-25 – 2016-10-27 (×3): via INTRAVENOUS
  Administered 2016-10-28: 1000 mL via INTRAVENOUS
  Administered 2016-10-28: 02:00:00 via INTRAVENOUS

## 2016-10-25 MED ORDER — LABETALOL HCL 5 MG/ML IV SOLN: 10.0000 mg | INTRAVENOUS | Status: DC | PRN

## 2016-10-25 MED ORDER — LABETALOL HCL 5 MG/ML IV SOLN
20.0000 mg | Freq: Four times a day (QID) | INTRAVENOUS | Status: DC | PRN
  Administered 2016-10-25: 20 mg via INTRAVENOUS
  Filled 2016-10-25: qty 4

## 2016-10-25 MED ORDER — SODIUM CHLORIDE 0.9 % IV SOLN
3.0000 g | Freq: Four times a day (QID) | INTRAVENOUS | Status: AC
  Administered 2016-10-25 – 2016-10-28 (×10): 3 g via INTRAVENOUS
  Filled 2016-10-25 (×12): qty 3

## 2016-10-25 MED ORDER — LABETALOL HCL 5 MG/ML IV SOLN
10.0000 mg | INTRAVENOUS | Status: DC | PRN
  Administered 2016-10-26 – 2016-10-27 (×7): 10 mg via INTRAVENOUS
  Filled 2016-10-25 (×6): qty 4

## 2016-10-25 NOTE — Progress Notes (Signed)
Pharmacy Antibiotic Note  Roger Gibson is a 71 y.o. male admitted on 09/26/2016 with seizure activity.  Pharmacy has been consulted for unasyn dosing.  Patient with possible biliary disease, has been receiving zosyn for leukocytosis since 9/29. Will change to unasyn per ID recommendations.  Plan: Unasyn 3g IV q6 hours  Height: 5\' 10"  (177.8 cm) Weight: 205 lb 0.4 oz (93 kg) IBW/kg (Calculated) : 73  Temp (24hrs), Avg:98.4 F (36.9 C), Min:98.1 F (36.7 C), Max:98.8 F (37.1 C)   Recent Labs Lab 10/10/2016 1345  10/22/16 0638 10/23/16 0231 10/23/16 1022 10/24/16 0234 10/24/16 0728 10/25/16 0732  WBC 7.0  --  8.4 8.1  --   --  10.4 10.5  CREATININE 2.29*  < > 1.80* 2.10* 2.18* 1.90* 1.90* 1.64*  < > = values in this interval not displayed.  Estimated Creatinine Clearance: 46.6 mL/min (A) (by C-G formula based on SCr of 1.64 mg/dL (H)).    Allergies  Allergen Reactions  . Iodinated Diagnostic Agents Hives and Swelling  . Iodine Hives and Swelling  . Tape Rash    Adhesive tape    Thank you for allowing pharmacy to be a part of this patient's care.  Erin Hearing PharmD., BCPS Clinical Pharmacist Pager [phone] 10/25/2016 3:07 PM

## 2016-10-25 NOTE — Progress Notes (Signed)
Neurology Progress Note   S:// No acute overnight events noted. Patient continues to be intermittently agitated.  O:// Current vital signs: BP (!) 180/80 (BP Location: Left Arm)   Pulse 97   Temp 98.8 F (37.1 C) (Axillary)   Resp (!) 25   Ht 5\' 10"  (1.778 m)   Wt 93 kg (205 lb 0.4 oz)   SpO2 97%   BMI 29.42 kg/m   Vital signs in last 24 hours: Temp:  [98.1 F (36.7 C)-99.6 F (37.6 C)] 98.8 F (37.1 C) (10/01 0800) Pulse Rate:  [90-104] 97 (10/01 0800) Resp:  [15-25] 25 (10/01 0800) BP: (150-180)/(80-100) 180/80 (10/01 0800) SpO2:  [97 %-99 %] 97 % (10/01 0800) Patient is awake, alert, follows commands Is able to answer some questions such as his name, but unable to tell me where he is or the date and time. Cranial nerves: Extraocular movements intact, visual fields full, face symmetric. Motor exam: Right upper extremity able to raise antigravity and sustained. Was not cooperative with the exam but is definitely weaker on the left upper and lower extremity. Sensory: Grimaces to noxious stimulus in all 4. Unable to test coordination or gait.  Medications  Current Facility-Administered Medications:  .  acetaminophen (TYLENOL) tablet 650 mg, 650 mg, Oral, Q6H PRN **OR** acetaminophen (TYLENOL) suppository 650 mg, 650 mg, Rectal, Q6H PRN, Opyd, Ilene Qua, MD, 650 mg at 10/22/16 1506 .  bisacodyl (DULCOLAX) suppository 10 mg, 10 mg, Rectal, Daily PRN, Opyd, Ilene Qua, MD .  camphor-menthol Timoteo Ace) lotion, , Topical, PRN, Jani Gravel, MD .  cholecalciferol (VITAMIN D) tablet 2,000 Units, 2,000 Units, Oral, Daily, Opyd, Ilene Qua, MD, 2,000 Units at 10/24/16 0904 .  dexamethasone (DECADRON) injection 4 mg, 4 mg, Intravenous, Q6H, Greta Doom, MD, 4 mg at 10/25/16 0523 .  diphenhydrAMINE (BENADRYL) injection 25 mg, 25 mg, Intravenous, Q6H PRN, Jani Gravel, MD, 25 mg at 10/25/16 3151 .  hydrALAZINE (APRESOLINE) injection 10 mg, 10 mg, Intravenous, Q6H, Corliss Parish, MD, 10 mg at 10/25/16 0938 .  [START ON 11/01/2016] Influenza vac split quadrivalent PF (FLUZONE HIGH-DOSE) injection 0.5 mL, 0.5 mL, Intramuscular, Tomorrow-1000, Sheikh, Omair Latif, DO .  insulin aspart (novoLOG) injection 0-9 Units, 0-9 Units, Subcutaneous, Q4H, Raiford Noble [place], [place], 2 Units at 10/25/16 7616 .  ipratropium (ATROVENT) nebulizer solution 0.5 mg, 0.5 mg, Nebulization, TID, Sheikh, Georgina Quint Latif, DO, 0.5 mg at 10/25/16 0743 .  labetalol (NORMODYNE,TRANDATE) injection 5-10 mg, 5-10 mg, Intravenous, Q2H PRN, Opyd, Ilene Qua, MD, 10 mg at 10/25/16 0323 .  lacosamide (VIMPAT) 200 mg in sodium chloride 0.9 % 25 mL IVPB, 200 mg, Intravenous, Q12H, Greta Doom, MD, Stopped at 10/25/16 0112 .  levalbuterol (XOPENEX) nebulizer solution 0.63 mg, 0.63 mg, Nebulization, TID, Sheikh, Georgina Quint Latif, DO, 0.63 mg at 10/25/16 0743 .  levETIRAcetam (KEPPRA) 750 mg in sodium chloride 0.9 % 100 mL IVPB, 750 mg, Intravenous, Q12H, Greta Doom, MD, Last Rate: 430 mL/hr at 10/25/16 0938, 750 mg at 10/25/16 0938 .  morphine 4 MG/ML injection 1-3 mg, 1-3 mg, Intravenous, Q4H PRN, Opyd, Ilene Qua, MD, 2 mg at 10/24/16 0905 .  mycophenolate (CELLCEPT) 250 mg in dextrose 5 % 42.5 mL IVPB, 250 mg, Intravenous, Q12H, Lyndee Leo, RPH, Stopped at 10/25/16 0719 .  ondansetron (ZOFRAN) tablet 4 mg, 4 mg, Oral, Q6H PRN **OR** ondansetron (ZOFRAN) injection 4 mg, 4 mg, Intravenous, Q6H PRN, Opyd, Timothy S, MD .  piperacillin-tazobactam (ZOSYN) IVPB 3.375 g, 3.375 g, Intravenous,  [address] [place], [place], Stopped at 10/25/16 3295 .  pravastatin (PRAVACHOL) tablet 20 mg, 20 mg, Oral, q1800, Opyd, Ilene Qua, MD, 20 mg at 10/24/16 1733 .  senna-docusate (Senokot-S) tablet 1 tablet, 1 tablet, Oral, QHS PRN, Opyd, Timothy S, MD .  sodium chloride flush (NS) 0.9 % injection 3 mL, 3 mL, Intravenous, Q12H, Opyd, Ilene Qua, MD, 3 mL at 10/24/16 2015 .  tacrolimus (PROGRAF) capsule 4 mg, 4 mg,  Oral, BID, 4 mg at 10/24/16 2023 **OR** tacrolimus (PROGRAF) capsule 4 mg, 4 mg, Sublingual, BID, Corliss Parish, MD .  tamsulosin (FLOMAX) capsule 0.4 mg, 0.4 mg, Oral, QPC supper, Opyd, Timothy S, MD, 0.4 mg at 10/24/16 1733 .  valproate (DEPACON) 500 mg in dextrose 5 % 50 mL IVPB, 500 mg, Intravenous, Q8H, Greta Doom, MD, Last Rate: 55 mL/hr at 10/25/16 0757, 500 mg at 10/25/16 0757  Labs CBC    Component Value Date/Time   WBC 10.5 10/25/2016 0732   RBC 4.98 10/25/2016 0732   HGB 14.6 10/25/2016 0732   HGB 9.5 (L) 10/09/2012 1447   HCT 43.6 10/25/2016 0732   HCT 29.1 (L) 10/09/2012 1447   PLT PENDING 10/25/2016 0732   PLT 290 10/09/2012 1447   MCV 87.6 10/25/2016 0732   MCV 91 10/09/2012 1447   MCH 29.3 10/25/2016 0732   MCHC 33.5 10/25/2016 0732   RDW 14.4 10/25/2016 0732   RDW 17.9 (H) 10/09/2012 1447   LYMPHSABS PENDING 10/25/2016 0732   LYMPHSABS 0.4 (L) 09/22/2012 0551   MONOABS PENDING 10/25/2016 0732   MONOABS 0.2 09/22/2012 0551   EOSABS PENDING 10/25/2016 0732   EOSABS 0.0 09/22/2012 0551   BASOSABS PENDING 10/25/2016 0732   BASOSABS 0.0 09/22/2012 0551    CMP     Component Value Date/Time   NA 152 (H) 10/25/2016 0732   NA 134 (L) 10/09/2012 1447   K 3.8 10/25/2016 0732   K 5.0 10/11/2012 1044   CL 115 (H) 10/25/2016 0732   CL 94 (L) 10/09/2012 1447   CO2 24 10/25/2016 0732   CO2 33 (H) 10/09/2012 1447   GLUCOSE 124 (H) 10/25/2016 0732   GLUCOSE 78 10/09/2012 1447   BUN 30 (H) 10/25/2016 0732   BUN 33 (H) 10/09/2012 1447   CREATININE 1.64 (H) 10/25/2016 0732   CREATININE 9.78 (H) 10/09/2012 1447   CALCIUM 10.0 10/25/2016 0732   CALCIUM 11.0 (H) 10/09/2012 1447   PROT 7.3 10/25/2016 0732   ALBUMIN 2.9 (L) 10/25/2016 0732   AST 145 (H) 10/25/2016 0732   ALT 60 10/25/2016 0732   ALKPHOS 69 10/25/2016 0732   BILITOT 0.9 10/25/2016 0732   GFRNONAA 40 (L) 10/25/2016 0732   GFRNONAA 5 (L) 10/09/2012 1447   GFRAA 47 (L) 10/25/2016 0732    GFRAA 6 (L) 10/09/2012 1447    Assessment:  Patient is a 71 year old man who presented to the emergency room with new onset seizure and persistent postictal left-sided hemiparesis. He was unable to cooperate for a full MRI, but the few images that were obtained are suspicious for a right frontal lesion which could be a mass versus metastasis. LTM EEG showed PLEDs in the right hemisphere. His EEG leads are being taken off with the hope that he'll be able to get an MRI at this point. After the MRI is done, can consider doing a spinal tap as the patient is immunocompromised due to his renal transplant, if the MRI shows any suggestion of a possible underlying infectious etiology.  I would not do a spinal tap before the MRI as that can cause leptomeningeal enhancement and it would be unclear whether the enhancement is because of any sort of infection nor as a result of the spinal tap that was performed.  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 Rule out infection  Recommendations: At this point, I would recommend continuing Keppra 750 twice a day, Vimpat 200 twice a day and Depakote 500 mg every 8 hours. MRI brain with and without contrast as soon as possible. Blood cultures - bacterial and fungal for any systemic signs of infection Maintain seizure precautions  -- Amie Portland, MD [facility] [phone]  If 7pm to 7am, please call on call as listed on AMION.

## 2016-10-25 NOTE — Evaluation (Signed)
Physical Therapy Evaluation Patient Details Name: Roger Gibson MRN: [id number] DOB: 02-Apr-1945 Today's Date: 10/25/2016   History of Present Illness  Roger Gibson is a 71 y.o. male with medical history significant for hypertension, chronic combined systolic/diastolic CHF, history of right kidney cancer, end-stage renal disease status post renal transplantation, that presented to the ER with left-sided weakness. He had reportedly been in his usual state night of admit, driving them to dinner and back home. He went to bed as usual around 11 PM, was noted to sleep in which is not unusual for him, but then called out to his wife at approximately 1 PM, reporting difficulty getting out of bed. She had noted earlier that he was sleeping with his left leg hanging off the bed, and she was not strong enough to help stand him up. A neighbor came to help get the patient up, at which point he was noted to have dried blood caked about his mouth, and EMS was called. Patient complained of some abdominal discomfort with EMS, but had not been expressing any recent complaints aside from some minor knee pain.Upon arrival to the ED, patient is found to be afebrile, saturating well on room air, slightly tachypneic, and with vitals otherwise stable. Noncontrast head CT reveals a small right frontal lobe abnormality of uncertain etiology. This was followed by MRI brain which was aborted early due to patient intolerance, but notable for abnormal signal at the right superior frontal gyrus consistent with vasogenic edema. Neurology was consulted and patient has undergone EEG.  He was admitted to the telemetry unit for ongoing evaluation and management of acute neurologic deficits with vasogenic edema noted on a limited MRI and suspected Seizure. Neurology placed patient on Antiepileptics. Troponins increased and Discussed Case with Cardiology who recommended to obtain an ECHO and outpatient stress test.  Patient decompensated and  was transferred to SDU on 9/28-9/29 and placed on BiPAP. Remained on BiPAP 9/29 as he remained lethargic. 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 Patient was taken off of BiPAP and was on LTM EEG which showed evidence of a structural abnormality in the right hemisphere, with epileptogenic potential and suggestion of acute-subacute injury. There is periodic  pattern of sharp waves ("PLEDs"). This is indicative  of a high risk of seizure occurrence, but no distinct seizures are seen. Neurology decreased Keppra and added Depakote today. Patient still go for MRI of Brain.   Past Medical History:  Diagnosis Date  . Acute focal neurological deficit 09/2016  . Allergy   . Blood transfusion without reported diagnosis   . Cancer ([place])    right kidney ca  . Confusion    during admission to [facility] 2012, persisted after discharge  . ESRD (end stage renal disease) ([place])   . ESRD (end stage renal disease) on dialysis ([place])    T, Th, Sat HD in [place], [facility]  . Gout 01/2002   Right knee; Left great toe  . HLD (hyperlipidemia) 1999  . HTN (hypertension) 1974  . Iron deficiency anemia    PRE 2004/06/2000  . Renal cysts, acquired, bilateral    per [place] [facility]    Clinical Impression  Pt admitted with above diagnosis. Pt currently with functional limitations due to the deficits listed below (see PT Problem List). Pt sat EOB  With total assist +2 with pt unable to assist with sitting and only following commands to look at therapist and move hand  minimally.  Will follow acutely.  REcommend REhab and hopeful that pt will make progress.  Will follow acutely.  Pt will benefit from skilled PT to increase their independence and safety with mobility to allow discharge to the venue listed below.      Follow Up Recommendations CIR;Supervision/Assistance - 24 hour    Equipment Recommendations  Other (comment)  (tBA)    Recommendations for Other Services Rehab consult     Precautions / Restrictions Precautions Precautions: Fall (seizure) Restrictions Weight Bearing Restrictions: No      Mobility  Bed Mobility Overal bed mobility: Needs Assistance Bed Mobility: Supine to Sit     Supine to sit: Total assist;+2 for physical assistance     General bed mobility comments: di dnot assist at all to EOB.   Transfers                    Ambulation/Gait                Stairs            Wheelchair Mobility    Modified Rankin (Stroke Patients Only) Modified Rankin (Stroke Patients Only) Pre-Morbid Rankin Score: No symptoms Modified Rankin: Severe disability     Balance Overall balance assessment: Needs assistance Sitting-balance support: No upper extremity supported;Feet supported Sitting balance-Leahy Scale: Zero Sitting balance - Comments: Pt sat EOB 5 min total assist.  Could not get pt to respond to any commands except to "look at therapist" and asked pt to touch OT hand and pt moved hand but did not touch therapist hand.  Pt leaning right and never could obtain balance.  Postural control: Right lateral lean;Posterior lean;Left lateral lean                                   Pertinent Vitals/Pain Pain Assessment: No/denies pain  VSS except BP initially 180/80 and 175/130 after evaluation.    Home Living Family/patient expects to be discharged to:: Private residence Living Arrangements: Spouse/significant other Available Help at Discharge: Family (wife cannot provide heavy physical assist) Type of Home: House Home Access: Stairs to enter Entrance Stairs-Rails: Right Entrance Stairs-Number of Steps: 3 Home Layout: One level Home Equipment:  (niece states he has some equipment but she isnt sure what)      Prior Function Level of Independence: Independent         Comments: Driving and no real deficits per niece     Hand Dominance    Dominant Hand: Right    Extremity/Trunk Assessment   Upper Extremity Assessment Upper Extremity Assessment: Defer to OT evaluation    Lower Extremity Assessment Lower Extremity Assessment: RLE deficits/detail;LLE deficits/detail RLE Deficits / Details: No movement to command LLE Deficits / Details: No movement to command.    Cervical / Trunk Assessment Cervical / Trunk Assessment: Kyphotic  Communication   Communication: Expressive difficulties;Receptive difficulties  Cognition Arousal/Alertness: Lethargic Behavior During Therapy: Flat affect Overall Cognitive Status: Impaired/Different from baseline Area of Impairment: Orientation;Following commands;Safety/judgement;Awareness;Problem solving;Attention                 Orientation Level: Disoriented to;Place;Time;Situation Current Attention Level: Focused   Following Commands: Follows one step commands inconsistently Safety/Judgement: Decreased awareness of safety;Decreased awareness of deficits   Problem Solving: Difficulty sequencing;Decreased initiation;Slow processing;Requires verbal cues;Requires tactile cues General Comments: Pt repeats commands at times with delayed response -?Echolalia.  Pt appears to have issues with  motor planning.       General Comments      Exercises     Assessment/Plan    PT Assessment Patient needs continued PT services  PT Problem List Decreased strength;Decreased activity tolerance;Decreased balance;Decreased mobility;Decreased knowledge of use of DME;Decreased safety awareness;Decreased knowledge of precautions;Decreased coordination;Decreased cognition       PT Treatment Interventions DME instruction;Gait training;Functional mobility training;Therapeutic activities;Therapeutic exercise;Balance training;Patient/family education;Wheelchair mobility training;Cognitive remediation    PT Goals (Current goals can be found in the Care Plan section)  Acute Rehab PT Goals Patient  Stated Goal: pt unable to state PT Goal Formulation: Patient unable to participate in goal setting Time For Goal Achievement: 11/08/16 Potential to Achieve Goals: Good    Frequency Min 4X/week   Barriers to discharge Decreased caregiver support (wife cannot provide heavy physical assist)      Co-evaluation PT/OT/SLP Co-Evaluation/Treatment: Yes Reason for Co-Treatment: For patient/therapist safety;Complexity of the patient's impairments (multi-system involvement) PT goals addressed during session: Mobility/safety with mobility         AM-PAC PT "6 Clicks" Daily Activity  Outcome Measure Difficulty turning over in bed (including adjusting bedclothes, sheets and blankets)?: Unable Difficulty moving from lying on back to sitting on the side of the bed? : Unable Difficulty sitting down on and standing up from a chair with arms (e.g., wheelchair, bedside commode, etc,.)?: Unable Help needed moving to and from a bed to chair (including a wheelchair)?: Total Help needed walking in hospital room?: Total Help needed climbing 3-5 steps with a railing? : Total 6 Click Score: 6    End of Session   Activity Tolerance: Patient limited by fatigue Patient left: in bed;with call bell/phone within reach;with bed alarm set;with family/visitor present Nurse Communication: Mobility status;Need for lift equipment Notified nurse that pt had rash of which she was aware. PT Visit Diagnosis: Muscle weakness (generalized) (M62.81);Hemiplegia and hemiparesis Hemiplegia - Right/Left: Left Hemiplegia - dominant/non-dominant: Non-dominant Hemiplegia - caused by: Unspecified (still awaiting MRI )    Time: 9191-6606 PT Time Calculation (min) (ACUTE ONLY): 26 min   Charges:   PT Evaluation $PT Eval Moderate Complexity: 1 Mod     PT G Codes:        Lousie Calico,PT Acute Rehabilitation [phone] (pager)   Denice Paradise 10/25/2016, 10:04 AM

## 2016-10-25 NOTE — Progress Notes (Signed)
Inpatient Rehabilitation  Per PT request, patient was screened by Gunnar Fusi for appropriateness for an Inpatient Acute Rehab consult.  At this time noted limited ability to particiapte in therapy.  Plan to follow for increased therapy tolerance/particaipteion prior to recommending an Inpatient Rehab consult.  Please call with questions.   Carmelia Roller., CCC/SLP Admission Coordinator  [place]  Cell [phone]

## 2016-10-25 NOTE — Progress Notes (Signed)
  Speech Language Pathology Treatment: Dysphagia  Patient Details Name: Roger Gibson MRN: [id number] DOB: 07-22-1945 Today's Date: 10/25/2016 Time: 6759-1638 SLP Time Calculation (min) (ACUTE ONLY): 13 min  Assessment / Plan / Recommendation Clinical Impression  Pt initially evaluated 9/28 with Dys 1, thin liquids ordered; made NPO by RN 9/29 (no documented note re: po's; suspect lethargy and/or cognitive status). Pt's RN stated this SLP could see for diagnostic treatment. Oral care/decontamination performed for infection control. Pt confused, answers "yes" to most questions. Cognitively based oral dysphagia exhibited with decreased awareness, incoordinated attempts to control ice chip/masticate and teaspoon sips thin water. Suspect delayed swallow initiation. Not able to safely initiate po's presently; provide oral care. Pt may benefit from non oral means of nutrition. MRI pending. Will continue ST.   HPI HPI: Roger Gibson a 71 y.o.malewith medical history significant for hypertension, chronic combined systolic/diastolic CHF, history of right kidney cancer, end-stage renal disease status post renal transplantation, that presented to the emergency department with left-sided weakness. Noncontrast head CT reveals a small right frontal lobe abnormality of uncertain etiology. This was followed by MRI brain which was aborted early due to patient intolerance, but notable for abnormal signal at the right superior frontal gyrus consistent with vasogenic edema. He was admitted to the telemetry unit for ongoing evaluation and management of acute neurologic deficits with vasogenic edema noted on a limited MRI.      SLP Plan  Continue with current plan of care       Recommendations  Diet recommendations: NPO Medication Administration: Via alternative means                Oral Care Recommendations: Oral care BID Follow up Recommendations: Skilled Nursing facility SLP Visit Diagnosis:  Dysphagia, unspecified (R13.10) Plan: Continue with current plan of care                      [name] 10/25/2016, 2:24 PM  [name].Ed [employer] [phone]

## 2016-10-25 NOTE — Progress Notes (Signed)
[facility] Rounding Note  Subjective:   Creatinine trending down Remains restless Relative in room Says "all I can get is a one word answer or two"   Objective Vital signs in last 24 hours: Vitals:   10/25/16 0315 10/25/16 0400 10/25/16 0743 10/25/16 0800  BP: (!) 178/100 (!) 159/87  (!) 180/80  Pulse: (!) 103   97  Resp:  (!) 22  (!) 25  Temp: 98.1 F (36.7 C)   98.8 F (37.1 C)  TempSrc: Tympanic   Axillary  SpO2:  98% 98% 97%  Weight:      Height:       Weight change:   Intake/Output Summary (Last 24 hours) at 10/25/16 0916 Last data filed at 10/25/16 0811  Gross per 24 hour  Intake              595 ml  Output             1575 ml  Net             -980 ml   Physical Exam: Tries to say a word or 2 - 1 word answers, perseverates Opens eyes Very restless VS as noted Regular rhythm S1S2 No S3 Tachy 90's Abd soft. No obvious allograft tenderness/transplant left pelvis firm and not tender No LE edema   Recent Labs Lab 10/24/16 0234 10/24/16 0728 10/25/16 0732  NA 147* 147* 152*  K 4.1 3.8 3.8  CL 111 109 115*  CO2 26 24 24   GLUCOSE 288* 285* 124*  BUN 31* 27* 30*  CREATININE 1.90* 1.90* 1.64*  CALCIUM 9.1 9.1 10.0  PHOS 2.0* 1.8* 1.5*     Recent Labs Lab 10/23/16 0231 10/24/16 0234 10/24/16 0728 10/25/16 0732  AST 187*  --  110* 145*  ALT 58  --  52 60  ALKPHOS 70  --  55 69  BILITOT 1.8*  --  1.2 0.9  PROT 7.8  --  6.5 7.3  ALBUMIN 3.6 2.8* 2.9* 2.9*     Recent Labs Lab 09/28/2016 1345  10/22/16 0638 10/23/16 0231 10/24/16 0728  WBC 7.0  --  8.4 8.1 10.4  NEUTROABS 5.3  --   --  7.1  --   HGB 12.9*  < > 14.3 18.1* 12.1*  HCT 39.1  < > 42.8 55.5* 36.6*  MCV 87.9  --  89.0 89.5 87.6  PLT 158  --  140* 91* 121*  < > = values in this interval not displayed.   Recent Labs Lab 10/12/2016 1919 10/01/2016 2325 10/22/16 0638 10/23/16 1022  CKTOTAL 9,711*  --  12,724* 6,006*  CKMB  --   --   --  6.6*  TROPONINI 0.13* 0.17* 0.22* 0.12*    CBG:  Recent Labs Lab 10/24/16 1645 10/24/16 1923 10/24/16 2339 10/25/16 0311 10/25/16 0809  GLUCAP 183* 166* 123* 179* 122*   Studies/Results: US Renal  Result Date: 10/23/2016 CLINICAL DATA:  Acute kidney injury. History of left nephrectomy with left renal transplant. EXAM: RENAL / URINARY TRACT ULTRASOUND COMPLETE COMPARISON:  CT of the abdomen and pelvis on 10/22/2016 FINDINGS: Right Kidney: Length: Not identified. Suspect right native kidney is surgically absent. Technologist reports a large right flank scar. Left Kidney: Length: Surgically absent native left kidney. Left renal transplant: Length:  12.4 cm.  Normal renal echogenicity.  Mild hydronephrosis. Bladder: The bladder is decompressed. Study quality is degraded because the patient is unable to suspend respirations for the exam due to BiPAP. IMPRESSION: 1. Left nephrectomy.  2. Right kidney is not identified and is likely surgically absent. 3. Hydronephrosis of the transplant kidney in the left renal fossa. Electronically Signed   By: Nolon Nations M.D.   On: 10/23/2016 18:30   Dg Chest Port 1 View  Result Date: 10/24/2016 CLINICAL DATA:  Shortness of breath. EXAM: PORTABLE CHEST 1 VIEW COMPARISON:  Yesterday. FINDINGS: Stable enlargement of the cardiac silhouette and diffuse prominence of the interstitial markings. Small amount of linear density at the left lung base with improvement. Small amount of left pleural fluid or prominent epicardial fat pad. Stable left subclavian vein stent. Unremarkable bones. IMPRESSION: 1. Decreased left basilar atelectasis. 2. Stable cardiomegaly and chronic interstitial lung disease. Electronically Signed   By: Claudie Revering M.D.   On: 10/24/2016 10:13   Medications: Infusions: . lacosamide (VIMPAT) IV Stopped (10/25/16 0112)  . levETIRAcetam Stopped (10/25/16 0057)  . mycophenolate (CELLCEPT) IV Stopped (10/25/16 0719)  . piperacillin-tazobactam (ZOSYN)  IV Stopped (10/25/16 0721)  .  valproate sodium 500 mg (10/25/16 0757)    Scheduled Medications: . cholecalciferol  2,000 Units Oral Daily  . dexamethasone  4 mg Intravenous Q6H  . hydrALAZINE  10 mg Intravenous Q6H  . [START ON 11/09/2016] Influenza vac split quadrivalent PF  0.5 mL Intramuscular Tomorrow-1000  . insulin aspart  0-9 Units Subcutaneous Q4H  . ipratropium  0.5 mg Nebulization TID  . levalbuterol  0.63 mg Nebulization TID  . pravastatin  20 mg Oral q1800  . sodium chloride flush  3 mL Intravenous Q12H  . tacrolimus  4 mg Oral BID   Or  . tacrolimus  4 mg Sublingual BID  . 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 Baseline creatinine between 1.5 and 1.7. Pred/prograf/myfortic.  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 Presented to the ED with left-sided weakness, admitted with sz- brain imaging showing stroke vs possible/probable frontal lobe brain mass  1. Renal xpl #2 - BL creatinine 1.5-1.7. BK reactivation. AKI on CKD (creatinine 2.29 on admission) on setting of hospitalization for possibly stroke/seizure/possible brain tumor with decreased mental status. Urinalysis is unremarkable so not making me think of rejection. Pharmacy assistance to get his antirejection medications changed to alternate form and check a Prograf level (still pending). Creatinine trending down today.  2. Metabolic acidosis - has corrected. Bicarb drip off. Watch chemistries. 3. Frontal lobe lesion/left sided weakness/lethargy - worrisome for brain tumor (vs infection). MRI still pending (unable to complete 2/2 medical decomp requiring BIPAP). On anti seizure meds. Steroids for vasogenic edema. CT chest/abd and pelvis neg for mets. Neuro involved. On immune suppression  so infectious disease should be in the differential, brain mass due to infection ??. White blood count is slightly up but diff to interpret. Has been started on empiric Zosyn. Will add EBV, BK, toxo and CMV titers to next lab draw. 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  4. Systolic and diastolic HF - + fluid balance 2/2 bicarb drip. (drip stopped, lasix not resumed) 5. HTN -  BP remains elevated. Has IV hydralazine ordered, mildly  tachycardic. Uses labetolol at home. Adding IV labetolo. to standing hydralazine.    Roger Maes, MD [facility] [phone] Pager 10/25/2016, 9:16 AM

## 2016-10-25 NOTE — Progress Notes (Signed)
PROGRESS NOTE    Roger Gibson  MHD:[id number] DOB: [birth year] DOA: 10/06/2016 PCP: [name], MD   Brief Narrative:  Roger Gibson is a 71 y.o. male with medical history significant for hypertension, chronic combined systolic/diastolic CHF, history of right kidney cancer, end-stage renal disease status post renal transplantation, that presented to the emergency department with left-sided weakness. History is obtained from the patient's wife at the bedside. He had reportedly been in his usual state last night, driving them to dinner and back home. He went to bed as usual around 11 PM, was noted to sleep in which is not unusual for him, but then called out to his wife at approximately 1 PM, reporting difficulty getting out of bed. She had noted earlier that he was sleeping with his left leg hanging off the bed, and she was not strong enough to help stand him up. A neighbor came to help get the patient up, at which point he was noted to have dried blood caked about his mouth, and EMS was called. Patient complained of some abdominal discomfort with EMS, but had not been expressing any recent complaints aside from some minor knee pain.  Upon arrival to the ED, patient is found to be afebrile, saturating well on room air, slightly tachypneic, and with vitals otherwise stable. Noncontrast head CT reveals a small right frontal lobe abnormality of uncertain etiology. This was followed by MRI brain which was aborted early due to patient intolerance, but notable for abnormal signal at the right superior frontal gyrus consistent with vasogenic edema. Neurology was consulted and patient has undergone EEG. He was treated with a liter of normal saline in the ED. He was admitted to the telemetry unit for ongoing evaluation and management of acute neurologic deficits with vasogenic edema noted on a limited MRI and suspected Seizure. Neurology placed patient on Antiepileptics. Troponins increased and Discussed  Case with Cardiology who recommended to obtain an ECHO and outpatient stress test. Currently still don't know what frontal mass with vasogenic edema and is being worked up.   Patient decompensated and was transferred to SDU on 9/28-9/29 and placed on BiPAP. Remained on BiPAP all day yesterday as he remained lethargic. 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 Neurology planning on doing Overnight EEG. I discussed the case with Neurosurgery PA, and patient was placed on their list to see. Will need MRI and may need it under sedation.   Patient was taken off of BiPAP and was on LTM EEG which showed evidence of a structural abnormality in the right hemisphere, with epileptogenic potential and suggestion of acute-subacute injury. There is periodic  pattern of sharp waves ("PLEDs"). This is indicative of a high risk of seizure occurrence, but no distinct seizures are seen. Neurology decreased Keppra and added Depakote today. Patient still go for MRI of Brain.   Patient remained confused and given his Immunocompromised state Infectious Diseases was consulted for additional recommendations.   Assessment & Plan:   Principal Problem:   Acute focal neurological deficit Active Problems:   Hypokalemia   History of renal transplant   Immunosuppressed status (HCC)   AKI (acute kidney injury) ([place])   CKD (chronic kidney disease), stage III (HCC)   Elevated troponin   Vasogenic brain edema (HCC)   Acute encephalopathy   Chronic combined systolic and diastolic CHF (congestive heart failure) (HCC)   Metabolic acidosis   Acute respiratory failure (HCC)   SIRS (systemic inflammatory response  syndrome) (HCC)   Pneumobilia   Acute respiratory distress  1. Acute neurologic event suspect Seizure with Todd's Paralysis in the setting of an ill-defined hypodensity in the Frontal Lobe with slight vasogenic brain edema  - Pt presented with left-sided  weakness after he was last seen normal at 23:00 the night before  - Dried blood about the mouth raised suspicion for seizure and EEG was performed,  - EEG recorded evidence of orbital dysfunction in the right posterior quadrant, suggestive of irritability. No seizure was recorded - CT Code Stroke with Small RIGHT frontal lobe infarct versus venous hypertension and cortical vein thrombosis versus metastasis.Moderate atrophy and mild chronic small vessel ischemic disease;  - MRI limited as was aborted early d/t pt intolerance, but revealed abnormal signal at the right superior frontal gyrus corresponding to the CT finding, but compatible with vasogenic edema rather than acute or subacute infarct. - MRA done but showed Severely motion degraded examination without internal carotid artery or basilar artery occlusion. Nondiagnostic for assessment of emergent large vessel occlusion. - Head CT ordered by Neurology this Afternoon showed Ill-defined hypodensity in the high right frontal lobe unchanged. The MRI was not diagnostic. This could represent edema related to infarct or tumor. There is mild mass-effect and effacement of the sulci.  - I discussed the Case with Neurology Dr. Leonel Ramsay personally and he is going to be ordering steroids for suspected vasogenic edema - Neurology ordered IV Fosphenytoin 1500 mg and IV Keppra 1000 mg q12h and was on Phenytoin; Phenytoin changed to Lacosamide 200 mg IV q12h due to interaction with Anti-Rejection Meds -Keppra Dose was reduced to IV 750 mg BID and Valparoate was added with initial 2000 mg dose and then 500 mg IV q8h  - Neurology consulted will follow-up on recommendations  - Unable to MRI/MRA as patient decompensated and ended up on BiPAP - Continue seizure precautions, continue frequent neuro checks, supportive care  - SLP done and recommended Dysphagia 1 Puree Diet with Thin Liquids but now recommending NPO - Per my discussion with Dr. Tobias Alexander will need to  rule out Metastasis and recommended ordering CT Chest/Abd/Pelvis -CT Chest/Abd/Pelvis done and showed no evidence of metastatic disease -Checking UDS -Neurology ordering Overnight EEG and LTM EEG showed evidence of a structural abnormality in the right hemisphere, with epileptogenic potential and suggestion of acute-subacute injury. There is periodic  pattern of sharp waves ("PLEDs"). This is indicative of a high risk of seizure occurrence, but no distinct seizures are seen.  -C/w Seizure Precautions  -Still awaiting MRI ofBrain to be done and Neurosurgery waiting to see and discuss findings when it is done.  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 Dr. Tommy Medal to send Toxoplasma IgG on serum as well as RPR, and HIV.  Ultimately if CNS lesion is c/w possible infection again we could do an LP and send for Toxoplasma by PCR, CMV PCR, EBV PCR (if it looks like primary CNS lympoma) crypto ag, fungal, afb cultures --BUT we would not want to get a DEFINITIVE diagnosis through brain biopsy rather than waiting on this CSF tests.  -IV Zosyn changed to IV Unasyn by ID  2. ESRD s/p Renal Transplantation - SCr is 2.29 on admission, up from 1.5  in August 2018  - He has Hx of right renal cancer and ESRD, now with transplant kidney  - Nephrology Dr. Moshe Cipro consulted for further evaluation and Recc's - Low-dose Prednisone 5 mg po Daily, Mycophenolate 180 mg po BID, and Tacrolimus 4 mg po BID changed to IV Forms but don't have IV Tacrolimus so will give Sublingually  -Prograf Level pending  -CT Scan showed Transplanted left kidney with mild ectasia of the intrarenal collecting system. Mild soft tissue edema and induration, nonspecific of the overlying left  lower quadrant soft tissues and flank. Surgically absent native kidneys.  3. Acute kidney superimposed on CKD stage III  - SCr is 2.29 on admission, up from 1.5 in August 2018  - He was given a liter of NS in ED and was continued on IVF hydration overnight; IVF Changed to Bicarb gtt - BUN/Cr went from 23/2.29 -> 18/1.80 -> 28/2.18 -> 27/1.90 -> 30/1.64 - Continue immunosuppressants as above - Obtain CT Chest/Abd/Pelvis w/o Contrast as below - Renal U/S showed Left nephrectomy. Right kidney is not identified and is likely surgically absent. Hydronephrosis of the transplant kidney in the left renal fossa - U/A appeared to have no infection; Urine Cx pending  - Repeat CMP in AM  4. Hypokalemia  - Serum potassium 3.3 on admission; Improved to 3.8 - Continue to Monitor and Replete as Necessary  - Repeat CMP in AM  5. Chronic Combined Systolic/Diastolic CHF  - Pt appeared hypovolemic on arrival and was treated with 1 L NS in ED  - Hold Lasix initially,  - Follow strict I/O's and daily wts,  -Patient is +1.8788 Liters  - Resume beta-blocker as tolerated  - C/w IVF rehydration with BiCarb Gtt   6. Hypertension  - BP at goal in ED, but increased today   - Managed at home with labetalol 400 mg po TID; Changed to IV Labetalol 10 mg IV q2hprn - Will use prn labetalol IVP's until passes swallow screen; Nephrology also added q6hprn Labetolol  - Nephrology starting scheduled Hydralazine to hold if SBP <140    7. Elevated Troponin - Troponin elevated to 0.10 on admission and increased to 0.22 along with CK which was 12,724; Repeat Trended down - No anginal complaints to suggest ACS  - Plan to continue cardiac monitoring - Ordered ECHOCardiogram and showed EF of 55-60% and Grade 1 DD - Discussed Case with Cardiologist Dr. Oval Linsey who evaluated patient's EKG and recommends not to continue to trend troponin and recommends outpatient Stress Test and Evaluation - Patient scheduled for follow at  D/C with Cardiologist   8. Metabolic Acidosis, improved -Unclear Etiology -Bicarb gtt stopped as CO2 was 24 -Repeat CMP in AM  9. Acute Respiratory Failure requiring NIPPV, improved -Patient decompensated requiring BiPAP; ? Related to Seizures vs other etiology  -ABG looked ok and showed 7.379/22.8/88.5/11.0/96.3% -CT Scan showed Centrilobular and paraseptal emphysema of the lungs without dominant mass. Mild pleural thickening and calcific plaque along the right lung base is stable -Attempt to Wean BiPAP -Repeat CXR in AM  10. SIRS -Patient was mildly Febrile, Tachycardic, Tachypenic  -Blood Cx Pending  -Started Empiric Abx with Zosyn and changed to Unasyn by ID -Follow Cx Data and order Blood Cx for Fever >101 -Ordered Urinalysis and Urine Cx showed multiple species present  11. Mild Intrahepatic/Extrahepatic Pneumobilia -? Related to Gall stone passing; No Abdominal Pain -Will discuss with General Surgery -Was on Zosyn and now on IV Unasyn   12. Hypernatremia -Worsening as Na+  went from 147 -> 152 -Stopped Fluids; May need NGT for free water Flushes -Will Start D5W at 75 mL/hr -Repeat CMP in AM  13. Hyperglycemia -Check HbA1c -Likely reactive in the setting of IV Decadron  -Added Sensitive Novolog SSI q4h as patient is NPO -Continue to monitor CBG's  14. Thrombocytopenia -Patient's Platelet Count went from 121 -> 107 -Continue to Monitor and Repeat Platelet Count in AM  15. Abnormal LFT's/ Elevated AST -AST worsening and went from 110 -> 145 -? Related to Pneumobilia -IV Abx changed from IV Zosyn to IV Unasyn -Repeat CMP in AM   DVT prophylaxis: SCDs Code Status: FULL CODE Family Communication: Discussed with wife at bedside Disposition Plan: Remain Inpatient  Consultants:   Neurology   Discussed Case with Cardiologist Dr. Oval Linsey who recommended outpatient ischemic evaluation/stress test  Nephrology   Neurosurgery   Infectious Diseases     Procedures:  ECHOCARDIOGRAM ------------------------------------------------------------------- Study Conclusions  - Left ventricle: The cavity size was normal. Wall thickness was   increased in a pattern of moderate LVH. Systolic function was   normal. The estimated ejection fraction was in the range of 55%   to 60%. Wall motion was normal; there were no regional wall   motion abnormalities. Doppler parameters are consistent with   abnormal left ventricular relaxation (grade 1 diastolic   dysfunction). - Aortic valve: Mildly calcified annulus. Trileaflet; normal   thickness leaflets. - Mitral valve: There was mild regurgitation. - Left atrium: The atrium was mildly dilated.   EEG EEG Abnormalities: 1) lateralized periodic discharges in the right posterior quadrant with delta wave morphology 2) utilize irregular slow activity  Clinical Interpretation: This EEG recorded evidence of orbital dysfunction in the right posterior quadrant, suggestive of irritability. No seizure was recorded.   Antimicrobials: Anti-infectives    Start     Dose/Rate Route Frequency Ordered Stop   10/25/16 2200  Ampicillin-Sulbactam (UNASYN) 3 g in sodium chloride 0.9 % 100 mL IVPB     3 g 200 mL/hr over 30 Minutes Intravenous Every 6 hours 10/25/16 1501     10/23/16 0800  piperacillin-tazobactam (ZOSYN) IVPB 3.375 g  Status:  Discontinued     3.375 g 12.5 mL/hr over 240 Minutes Intravenous Every 8 hours 10/23/16 0735 10/25/16 1254     Subjective: Seen and examined at bedside and remained confused and fidgety. Patient also remained somewhat agitated. No acute overnight events per nursing.   Objective: Vitals:   10/25/16 1200 10/25/16 1454 10/25/16 1600 10/25/16 1614  BP: (!) 182/105  (!) 146/112 (!) 146/112  Pulse:      Resp: 20     Temp:   98.9 F (37.2 C)   TempSrc:   Axillary   SpO2: 97% 96% 100%   Weight:      Height:        Intake/Output Summary (Last 24 hours) at 10/25/16  1959 Last data filed at 10/25/16 1927  Gross per 24 hour  Intake            907.5 ml  Output             1425 ml  Net           -517.5 ml   Filed Weights   10/23/16 0500 10/24/16 0335  Weight: 90 kg (198 lb 6.6 oz) 93 kg (205 lb 0.4 oz)   Examination: Physical Exam:  Constitutional: WN/WD AAM who remains confused and is in NAD Eyes: Sclerae anicteric. Lids normal ENMT: External Ears and  nose appear normal. Grossly normal hearing Neck: Supple with no JVD Respiratory: Diminished with no appreciable wheezing/rales/rhochi. Not tachypenia Cardiovascular: RRR; Mild LE edema Abdomen: Soft, NT, Distended to body habitus. Bowel sounds present GU: Deferred Musculoskeletal: No contractures; No cyanosis Skin: Warm and dry. Patient has been scratching. No rashes or lesions noted Neurologic: Difficult to test given patient's confusion and lack of cooperation but has left side weakness. CN 2-12 grossly intact.  Psychiatric: Awake but not alert. Remains confused. Impaired judgement and insight  Data Reviewed: I have personally reviewed following labs and imaging studies  CBC:  Recent Labs Lab 10/16/2016 1345 10/02/2016 1402 10/22/16 0638 10/23/16 0231 10/24/16 0728 10/25/16 0732  WBC 7.0  --  8.4 8.1 10.4 10.5  NEUTROABS 5.3  --   --  7.1  --  9.5*  HGB 12.9* 13.9 14.3 18.1* 12.1* 14.6  HCT 39.1 41.0 42.8 55.5* 36.6* 43.6  MCV 87.9  --  89.0 89.5 87.6 87.6  PLT 158  --  140* 91* 121* 607*   Basic Metabolic Panel:  Recent Labs Lab 10/22/16 0830 10/23/16 0231 10/23/16 1022 10/24/16 0234 10/24/16 0728 10/25/16 0732  NA  --  143 144 147* 147* 152*  K  --  4.6 4.4 4.1 3.8 3.8  CL  --  112* 112* 111 109 115*  CO2  --  13* 17* 26 24 24   GLUCOSE  --  164* 241* 288* 285* 124*  BUN  --  21* 28* 31* 27* 30*  CREATININE  --  2.10* 2.18* 1.90* 1.90* 1.64*  CALCIUM  --  9.4 9.2 9.1 9.1 10.0  MG 1.9 2.1  --   --  2.1 2.1  PHOS 3.0 3.6  --  2.0* 1.8* 1.5*   GFR: Estimated Creatinine  Clearance: 46.6 mL/min (A) (by C-G formula based on SCr of 1.64 mg/dL (H)). Liver Function Tests:  Recent Labs Lab 10/04/2016 1345 10/22/16 3710 10/23/16 0231 10/24/16 0234 10/24/16 0728 10/25/16 0732  AST 81* 209* 187*  --  110* 145*  ALT 26 48 58  --  52 60  ALKPHOS 67 63 70  --  55 69  BILITOT 1.1 1.7* 1.8*  --  1.2 0.9  PROT 7.1 6.8 7.8  --  6.5 7.3  ALBUMIN 4.0 3.7 3.6 2.8* 2.9* 2.9*   No results for input(s): LIPASE, AMYLASE in the last 168 hours. No results for input(s): AMMONIA in the last 168 hours. Coagulation Profile:  Recent Labs Lab 10/13/2016 1345  INR 1.08   Cardiac Enzymes:  Recent Labs Lab 10/22/2016 1919 10/08/2016 2325 10/22/16 0638 10/23/16 1022  CKTOTAL 9,711*  --  12,724* 6,006*  CKMB  --   --   --  6.6*  TROPONINI 0.13* 0.17* 0.22* 0.12*   BNP (last 3 results) No results for input(s): PROBNP in the last 8760 hours. HbA1C: No results for input(s): HGBA1C in the last 72 hours. CBG:  Recent Labs Lab 10/25/16 0311 10/25/16 0809 10/25/16 1055 10/25/16 1610 10/25/16 1910  GLUCAP 179* 122* 174* 156* 176*   Lipid Profile: No results for input(s): CHOL, HDL, LDLCALC, TRIG, CHOLHDL, LDLDIRECT in the last 72 hours. Thyroid Function Tests: No results for input(s): TSH, T4TOTAL, FREET4, T3FREE, THYROIDAB in the last 72 hours. Anemia Panel: No results for input(s): VITAMINB12, FOLATE, FERRITIN, TIBC, IRON, RETICCTPCT in the last 72 hours. Sepsis Labs: No results for input(s): PROCALCITON, LATICACIDVEN in the last 168 hours.  Recent Results (from the past 240 hour(s))  MRSA PCR Screening  Status: None   Collection Time: 10/23/16  1:58 AM  Result Value Ref Range Status   MRSA by PCR NEGATIVE NEGATIVE Final    Comment:        The GeneXpert MRSA Assay (FDA approved for NASAL specimens only), is one component of a comprehensive MRSA colonization surveillance program. It is not intended to diagnose MRSA infection nor to guide or monitor  treatment for MRSA infections.   Culture, Urine     Status: Abnormal   Collection Time: 10/23/16 11:12 AM  Result Value Ref Range Status   Specimen Description URINE, RANDOM  Final   Special Requests NONE  Final   Culture MULTIPLE SPECIES PRESENT, SUGGEST RECOLLECTION (A)  Final   Report Status 10/24/2016 FINAL  Final     Radiology Studies: Dg Chest Port 1 View  Result Date: 10/24/2016 CLINICAL DATA:  Shortness of breath. EXAM: PORTABLE CHEST 1 VIEW COMPARISON:  Yesterday. FINDINGS: Stable enlargement of the cardiac silhouette and diffuse prominence of the interstitial markings. Small amount of linear density at the left lung base with improvement. Small amount of left pleural fluid or prominent epicardial fat pad. Stable left subclavian vein stent. Unremarkable bones. IMPRESSION: 1. Decreased left basilar atelectasis. 2. Stable cardiomegaly and chronic interstitial lung disease. Electronically Signed   By: Claudie Revering M.D.   On: 10/24/2016 10:13   Scheduled Meds: . cholecalciferol  2,000 Units Oral Daily  . dexamethasone  4 mg Intravenous Q6H  . hydrALAZINE  10 mg Intravenous Q6H  . [START ON 11/16/2016] Influenza vac split quadrivalent PF  0.5 mL Intramuscular Tomorrow-1000  . insulin aspart  0-9 Units Subcutaneous Q4H  . ipratropium  0.5 mg Nebulization TID  . levalbuterol  0.63 mg Nebulization TID  . pravastatin  20 mg Oral q1800  . sodium chloride flush  3 mL Intravenous Q12H  . tacrolimus  4 mg Oral BID   Or  . tacrolimus  4 mg Sublingual BID  . tamsulosin  0.4 mg Oral QPC supper   Continuous Infusions: . ampicillin-sulbactam (UNASYN) IV    . lacosamide (VIMPAT) IV Stopped (10/25/16 1028)  . levETIRAcetam Stopped (10/25/16 0350)  . mycophenolate (CELLCEPT) IV 250 mg (10/25/16 1800)  . valproate sodium Stopped (10/25/16 1500)    LOS: 4 days   Kerney Elbe, DO [facility] Pager [phone]  If 7PM-7AM, please contact  night-coverage [email or web address] Password [email or web address] 10/25/2016, 7:59 PM

## 2016-10-25 NOTE — Consult Note (Addendum)
Date of Admission:  09/29/2016           Reason for Consult: Brain lesion in renal transplant patient   Referring Provider: Dr. Alfredia Ferguson   Assessment:  1. Possible brain mass  2. Renal transplant patient 3. Hx of Bk viremia 4. Pneumotobilia  Plan: 1. Agree with MRI brain when able 2. I would consider LP as well though, I am a bit apprehensive asking for procedures with a nebulous finding on imaging 3. Agree with sending BK virus PCR from blood, urine and if possible urine cytology 4. I will send serum HIV antibody, RPR, Toxoplasma IgG 5. Cover biliary tree with unasyn  Principal Problem:   Acute focal neurological deficit Active Problems:   Hypokalemia   History of renal transplant   Immunosuppressed status (HCC)   Acute kidney injury ([place])   CKD (chronic kidney disease), stage III (HCC)   Elevated troponin   Vasogenic brain edema (HCC)   Acute encephalopathy   Chronic combined systolic and diastolic CHF (congestive heart failure) (HCC)   Metabolic acidosis   Acute respiratory failure (HCC)   SIRS (systemic inflammatory response syndrome) (HCC)   Pneumobilia   . cholecalciferol  2,000 Units Oral Daily  . dexamethasone  4 mg Intravenous Q6H  . hydrALAZINE  10 mg Intravenous Q6H  . [START ON 11/18/2016] Influenza vac split quadrivalent PF  0.5 mL Intramuscular Tomorrow-1000  . insulin aspart  0-9 Units Subcutaneous Q4H  . ipratropium  0.5 mg Nebulization TID  . levalbuterol  0.63 mg Nebulization TID  . pravastatin  20 mg Oral q1800  . sodium chloride flush  3 mL Intravenous Q12H  . tacrolimus  4 mg Oral BID   Or  . tacrolimus  4 mg Sublingual BID  . tamsulosin  0.4 mg Oral QPC supper    HPI: Roger Gibson is a 71 y.o. 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 He had CT without contrast on admission that raised question of  Small RIGHT frontal lobe infarct versus venous hypertension and cortical vein thrombosis versus metastasis on code stroke non contrast head CT.   MRI done on the 27th though incomplete showed . Abnormal signal at the right superior frontal gyrus corresponding to the CT finding, but compatible with vasogenic edema rather thanacute or subacute infarct.  He is being seen by Nephrology, (also in context of worsening renal fxn), Neurology, Neurosurgery.   He remains delirious and team unable to obtain repeat MRI due to his frequent movements.  He is afebrile. CT chest abdomen and pelvis shows some pneumatobilia.   It is really NOT clear that the patient has a mass though he has focal deficits and new onset seizure. Obviously in the context of immuno-suppresion the differential is broad.   If he DOES in fact have a discrete mass in CNS then I would strongly recommend biopsy for path and for bacterial, AFB, Nocardia, fungal cultures, special stains.   An LP would be an indirect way to look for some of these things but not reliable in this highly complicated patient. Toxoplasma IgM has been sent but this is not a very reliable test. I will send a Toxoplasma IgG on serum as well as RPR, and HIV.  Ultimately if CNS lesion is c/w possible  infection again we could do an LP and send for Toxoplasma by PCR, CMV PCR, EBV PCR (if it looks like primary CNS lympoma) crypto ag, fungal, afb cultures --BUT we would not want to get a DEFINITIVE diagnosis through brain biopsy rather than waiting on this CSF tests.   Note he had MRI brain in 2016 at [facility] when he also had Neuro changes though no focal deficits that I can see through the chart.      Review of Systems: Review of Systems  Unable to perform ROS: Mental status change    Past Medical History:  Diagnosis Date  . Acute focal neurological deficit 09/2016  . Allergy   .  Blood transfusion without reported diagnosis   . Cancer ([place])    right kidney ca  . Confusion    during admission to [facility] 2012, persisted after discharge  . ESRD (end stage renal disease) ([place])   . ESRD (end stage renal disease) on dialysis ([place])    T, Th, Sat HD in [place], [place]  . Gout 01/2002   Right knee; Left great toe  . HLD (hyperlipidemia) 1999  . HTN (hypertension) 1974  . Iron deficiency anemia    PRE 2004/06/2000  . Renal cysts, acquired, bilateral    per Darol Destine    Social History  Substance Use Topics  . Smoking status: Former Smoker    Types: Cigarettes, Cigars    Quit date: 01/25/1998  . Smokeless tobacco: Never Used     Comment: occasional cigar  . Alcohol use No     Comment: very rare    Family History  Problem Relation Age of Onset  . Stomach cancer Father        ?  Marland Kitchen Hypertension Mother        On dialysis for years  . Hypertension Brother   . Hypertension Brother   . Hypertension Brother   . Hypertension Sister   . Breast cancer Sister        s/p mastectomy  . COPD Sister   . Hypertension Sister   . Kidney failure Sister        on dialysis  . Heart attack Paternal Grandfather   . Colon cancer Neg Hx   . Esophageal cancer Neg Hx   . Rectal cancer Neg Hx    Allergies  Allergen Reactions  . Iodinated Diagnostic Agents Hives and Swelling  . Iodine Hives and Swelling  . Tape Rash    Adhesive tape     OBJECTIVE: Blood pressure (!) 180/80, pulse 97, temperature 98.8 F (37.1 C), temperature source Axillary, resp. rate (!) 25, height 5\' 10"  (1.778 m), weight 205 lb 0.4 oz (93 kg), SpO2 96 %.  Physical Exam  Constitutional: No distress.  HENT:  Head: Normocephalic and atraumatic.  Mouth/Throat: No oropharyngeal exudate.  Eyes: Conjunctivae and EOM are normal. Right eye exhibits no discharge. Left eye exhibits no discharge. No scleral icterus.  Neck: Normal range of motion. No JVD present. No  tracheal deviation present.  Cardiovascular: Normal rate, regular rhythm and normal heart sounds.  Exam reveals no gallop and no friction rub.   No murmur heard. Pulmonary/Chest: Effort normal and breath sounds normal. No respiratory distress. He has no wheezes. He has no rales. He exhibits no tenderness.  Abdominal: Soft. He exhibits no distension. There is tenderness in the right upper quadrant. There is no rebound.  difficult to assess tenderness as he says "yes" to most of my  questions  Lymphadenopathy:    He has no cervical adenopathy.  Neurological:  Sleepy, arousable but delirious, left side weaker than right    Lab Results Lab Results  Component Value Date   WBC 10.5 10/25/2016   HGB 14.6 10/25/2016   HCT 43.6 10/25/2016   MCV 87.6 10/25/2016   PLT 107 (L) 10/25/2016    Lab Results  Component Value Date   CREATININE 1.64 (H) 10/25/2016   BUN 30 (H) 10/25/2016   NA 152 (H) 10/25/2016   K 3.8 10/25/2016   CL 115 (H) 10/25/2016   CO2 24 10/25/2016    Lab Results  Component Value Date   ALT 60 10/25/2016   AST 145 (H) 10/25/2016   ALKPHOS 69 10/25/2016   BILITOT 0.9 10/25/2016     Microbiology: Recent Results (from the past 240 hour(s))  MRSA PCR Screening     Status: None   Collection Time: 10/23/16  1:58 AM  Result Value Ref Range Status   MRSA by PCR NEGATIVE NEGATIVE Final    Comment:        The GeneXpert MRSA Assay (FDA approved for NASAL specimens only), is one component of a comprehensive MRSA colonization surveillance program. It is not intended to diagnose MRSA infection nor to guide or monitor treatment for MRSA infections.   Culture, Urine     Status: Abnormal   Collection Time: 10/23/16 11:12 AM  Result Value Ref Range Status   Specimen Description URINE, RANDOM  Final   Special Requests NONE  Final   Culture MULTIPLE SPECIES PRESENT, SUGGEST RECOLLECTION (A)  Final   Report Status 10/24/2016 FINAL  Final    Alcide Evener,  MD [facility] [phone] pager    10/25/2016, 3:39 PM

## 2016-10-25 NOTE — Progress Notes (Signed)
Waiting for MRI to be done Will see patient and discuss findings tomorrow if MRI has been completed by then

## 2016-10-25 NOTE — Evaluation (Signed)
Occupational Therapy Evaluation Patient Details Name: Roger Gibson MRN: [id number] DOB: [birth year] Today's Date: 10/25/2016    History of Present Illness Roger Gibson is a 71 y.o. male with medical history significant for hypertension, chronic combined systolic/diastolic CHF, history of right kidney cancer, end-stage renal disease status post renal transplantation, that presented to the ER with left-sided weakness. He had reportedly been in his usual state night of admit, driving them to dinner and back home. He went to bed as usual around 11 PM, was noted to sleep in which is not unusual for him, but then called out to his wife at approximately 1 PM, reporting difficulty getting out of bed. She had noted earlier that he was sleeping with his left leg hanging off the bed, and she was not strong enough to help stand him up. A neighbor came to help get the patient up, at which point he was noted to have dried blood caked about his mouth, and EMS was called. Patient complained of some abdominal discomfort with EMS, but had not been expressing any recent complaints aside from some minor knee pain.Upon arrival to the ED, patient is found to be afebrile, saturating well on room air, slightly tachypneic, and with vitals otherwise stable. Noncontrast head CT reveals a small right frontal lobe abnormality of uncertain etiology. This was followed by MRI brain which was aborted early due to patient intolerance, but notable for abnormal signal at the right superior frontal gyrus consistent with vasogenic edema. Neurology was consulted and patient has undergone EEG.  He was admitted to the telemetry unit for ongoing evaluation and management of acute neurologic deficits with vasogenic edema noted on a limited MRI and suspected Seizure. Neurology placed patient on Antiepileptics. Troponins increased and Discussed Case with Cardiology who recommended to obtain an ECHO and outpatient stress test.  Patient decompensated  and was transferred to SDU on 9/28-9/29 and placed on BiPAP. Remained on BiPAP 9/29 as he remained lethargic. 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 Patient was taken off of BiPAP and was on LTM EEG which showed evidence of a structural abnormality in the right hemisphere, with epileptogenic potential and suggestion of acute-subacute injury. There is periodic  pattern of sharp waves ("PLEDs"). This is indicative  of a high risk of seizure occurrence, but no distinct seizures are seen. Neurology decreased Keppra and added Depakote today. Patient still go for MRI of Brain.    Clinical Impression   PTA, pt was independent with ADL and functional mobility. He currently requires overall total assistance for ADL and functional mobility to sit at EOB. Pt presents with communication deficits, decreased cognition, decreased motor planning skills, B UE weakness (L>R), L head turn preference, focused attention, and L UE increased tone impacting his ability to participate in ADL tasks at [facility]. Pt would benefit from continued OT services while admitted to improve independence with ADL and functional mobility. Pt highly independent at baseline and would best benefit from CIR level therapies post-acute D/C to maximize return to PLOF. He appears motivated to participate. OT will continue to follow while admitted.     Follow Up Recommendations  CIR;Supervision/Assistance - 24 hour    Equipment Recommendations  Other (comment) (TBD at next venue of care)    Recommendations for Other Services Rehab consult     Precautions / Restrictions Precautions Precautions: Fall (seizure) Restrictions Weight Bearing Restrictions: No      Mobility Bed Mobility Overal bed mobility: Needs  Assistance Bed Mobility: Supine to Sit     Supine to sit: Total assist;+2 for physical assistance     General bed mobility comments: Pt unable to assist. Will  verbally acknowledge ccommand or repeat command.   Transfers                 General transfer comment: Unsafe to attempt    Balance Overall balance assessment: Needs assistance Sitting-balance support: No upper extremity supported;Feet supported Sitting balance-Leahy Scale: Zero Sitting balance - Comments: Pt sat EOB 5 min total assist.  Could not get pt to respond to any commands except to "look at therapist" and asked pt to touch OT hand and pt moved hand but did not touch therapist hand.  Pt leaning right and never could obtain balance.  Postural control: Right lateral lean;Posterior lean;Left lateral lean                                 ADL either performed or assessed with clinical judgement   ADL Overall ADL's : Needs assistance/impaired                                       General ADL Comments: Overall total assist for ADL participation this session.      Vision   Vision Assessment?: Vision impaired- to be further tested in functional context Additional Comments: Vision impaired and need to further assess. Did not track this session but is able to maintain midline gaze. Head maintained in L turned position.      Perception     Praxis      Pertinent Vitals/Pain Pain Assessment: No/denies pain     Hand Dominance Right   Extremity/Trunk Assessment Upper Extremity Assessment Upper Extremity Assessment: RUE deficits/detail;LUE deficits/detail RUE Deficits / Details: Moving against gravity with generalized weakness. Difficulty following commands for  LUE Deficits / Details: Tone throughout L UE with passive movement.    Lower Extremity Assessment Lower Extremity Assessment: Defer to PT evaluation RLE Deficits / Details: No movement to command LLE Deficits / Details: No movement to command.   Cervical / Trunk Assessment Cervical / Trunk Assessment: Kyphotic   Communication Communication Communication: Expressive  difficulties;Receptive difficulties (echolalic at times)   Cognition Arousal/Alertness: Lethargic Behavior During Therapy: Flat affect Overall Cognitive Status: Impaired/Different from baseline Area of Impairment: Orientation;Following commands;Safety/judgement;Awareness;Problem solving;Attention                 Orientation Level: Disoriented to;Place;Time;Situation Current Attention Level: Focused   Following Commands: Follows one step commands inconsistently Safety/Judgement: Decreased awareness of safety;Decreased awareness of deficits Awareness:  (pre-intellectual) Problem Solving: Difficulty sequencing;Decreased initiation;Slow processing;Requires verbal cues;Requires tactile cues General Comments: Pt repeats commands at times with delayed response -?Echolalia.  Pt appears to have issues with motor planning. For example, when asked to reach up and touch my hand, pt is able to initiate UE movement but will follow through with scratching his face instead.  Unsure if deficits of communication deficits, decreased ability to follow commands, or motor planning.    General Comments  Pt consistently scratching his chest and neck. Reports he is itchy "sometimes." Noted redness resembling rash and notified RN.     Exercises     Shoulder Instructions      Home Living Family/patient expects to be discharged to:: Private residence Living Arrangements: Spouse/significant other  Available Help at Discharge: Family (wife cannot provide heavy physical assist) Type of Home: House Home Access: Stairs to enter [employer] of Steps: 3 Entrance Stairs-Rails: Right Home Layout: One level               Home Equipment:  (niece states he has some equipment but she isnt sure what)   Additional Comments: Information gathered from niece. Need to find out bathroom set-up and equipment      Prior Functioning/Environment Level of Independence: Independent        Comments:  Driving and no real deficits per niece        OT Problem List: Decreased strength;Decreased activity tolerance;Impaired balance (sitting and/or standing);Decreased safety awareness;Decreased knowledge of use of DME or AE;Decreased knowledge of precautions;Pain      OT Treatment/Interventions: Self-care/ADL training;Therapeutic exercise;Energy conservation;DME and/or AE instruction;Therapeutic activities;Patient/family education;Balance training;Cognitive remediation/compensation;Visual/perceptual remediation/compensation    OT Goals(Current goals can be found in the care plan section) Acute Rehab OT Goals Patient Stated Goal: pt unable to state OT Goal Formulation: Patient unable to participate in goal setting Time For Goal Achievement: 11/08/16 Potential to Achieve Goals: Good ADL Goals Pt Will Perform Grooming: with mod assist;sitting Pt Will Transfer to Toilet: with max assist;with +2 assist;stand pivot transfer;bedside commode Additional ADL Goal #1: Pt will complete bed mobility in preparation for ADL participation seated at EOB with overall mod assist.  Additional ADL Goal #2: Pt will demonstrate sustained attention to seated grooming task with no more than 2 VCs. Additional ADL Goal #3: Pt will follow 2/3 one step commands with no more than 1 VC.   OT Frequency: Min 2X/week   Barriers to D/C:            Co-evaluation PT/OT/SLP Co-Evaluation/Treatment: Yes Reason for Co-Treatment: For patient/therapist safety;Complexity of the patient's impairments (multi-system involvement) PT goals addressed during session: Mobility/safety with mobility OT goals addressed during session: ADL's and self-care;Strengthening/ROM      AM-PAC PT "6 Clicks" Daily Activity     Outcome Measure Help from another person eating meals?: Total Help from another person taking care of personal grooming?: Total Help from another person toileting, which includes using toliet, bedpan, or urinal?:  Total Help from another person bathing (including washing, rinsing, drying)?: Total Help from another person to put on and taking off regular upper body clothing?: Total Help from another person to put on and taking off regular lower body clothing?: Total 6 Click Score: 6   End of Session Nurse Communication: Mobility status (pt with rash)  Activity Tolerance: Patient tolerated treatment well Patient left: in bed;with call bell/phone within reach;with family/visitor present (with Neuro MD present)  OT Visit Diagnosis: Muscle weakness (generalized) (M62.81);Cognitive communication deficit (R41.841);Hemiplegia and hemiparesis Symptoms and signs involving cognitive functions: Other cerebrovascular disease Hemiplegia - Right/Left: Left Hemiplegia - caused by: Unspecified                Time: 2440-1027 OT Time Calculation (min): 18 min Charges:  OT General Charges $OT Visit: 1 Visit OT Evaluation $OT Eval Moderate Complexity: 1 Mod G-Codes:     Norman Herrlich, MS OTR/L  Pager: [place] [facility] 10/25/2016, 10:27 AM

## 2016-10-25 DEATH — deceased

## 2016-10-26 ENCOUNTER — Encounter (HOSPITAL_COMMUNITY): Payer: Self-pay | Admitting: Radiology

## 2016-10-26 ENCOUNTER — Inpatient Hospital Stay (HOSPITAL_COMMUNITY): Payer: Medicare HMO

## 2016-10-26 DIAGNOSIS — R140 Abdominal distension (gaseous): Secondary | ICD-10-CM

## 2016-10-26 LAB — TOXOPLASMA ANTIBODIES- IGG AND  IGM

## 2016-10-26 LAB — CBC WITH DIFFERENTIAL/PLATELET
BASOS ABS: 0 10*3/uL (ref 0.0–0.1)
Basophils Relative: 0 %
Eosinophils Absolute: 0 10*3/uL (ref 0.0–0.7)
Eosinophils Relative: 0 %
HCT: 43.9 % (ref 39.0–52.0)
Hemoglobin: 14.2 g/dL (ref 13.0–17.0)
Lymphocytes Relative: 8 %
Lymphs Abs: 0.7 10*3/uL (ref 0.7–4.0)
MCH: 28.9 pg (ref 26.0–34.0)
MCHC: 32.3 g/dL (ref 30.0–36.0)
MCV: 89.2 fL (ref 78.0–100.0)
MONO ABS: 0.5 10*3/uL (ref 0.1–1.0)
MONOS PCT: 6 %
NEUTROS PCT: 86 %
Neutro Abs: 7.2 10*3/uL (ref 1.7–7.7)
PLATELETS: 143 10*3/uL — AB (ref 150–400)
RBC: 4.92 MIL/uL (ref 4.22–5.81)
RDW: 14.1 % (ref 11.5–15.5)
WBC: 8.4 10*3/uL (ref 4.0–10.5)

## 2016-10-26 LAB — COMPREHENSIVE METABOLIC PANEL
ALBUMIN: 2.8 g/dL — AB (ref 3.5–5.0)
ALT: 62 U/L (ref 17–63)
AST: 113 U/L — AB (ref 15–41)
Alkaline Phosphatase: 56 U/L (ref 38–126)
Anion gap: 9 (ref 5–15)
BUN: 34 mg/dL — AB (ref 6–20)
CHLORIDE: 120 mmol/L — AB (ref 101–111)
CO2: 26 mmol/L (ref 22–32)
Calcium: 10.1 mg/dL (ref 8.9–10.3)
Creatinine, Ser: 1.71 mg/dL — ABNORMAL HIGH (ref 0.61–1.24)
GFR calc Af Amer: 44 mL/min — ABNORMAL LOW (ref >60)
GFR, EST NON AFRICAN AMERICAN: 38 mL/min — AB (ref >60)
GLUCOSE: 221 mg/dL — AB (ref 65–99)
Potassium: 3.8 mmol/L (ref 3.5–5.1)
Sodium: 155 mmol/L — ABNORMAL HIGH (ref 135–145)
Total Bilirubin: 0.8 mg/dL (ref 0.3–1.2)
Total Protein: 6.9 g/dL (ref 6.5–8.1)

## 2016-10-26 LAB — EPSTEIN BARR VRS(EBV DNA BY PCR)
EBV DNA QN by PCR: POSITIVE copies/mL
log10 EBV DNA Qn PCR: UNDETERMINED log10 copy/mL

## 2016-10-26 LAB — GLUCOSE, CAPILLARY
GLUCOSE-CAPILLARY: 159 mg/dL — AB (ref 65–99)
GLUCOSE-CAPILLARY: 204 mg/dL — AB (ref 65–99)
GLUCOSE-CAPILLARY: 167 mg/dL — AB (ref 65–99)
GLUCOSE-CAPILLARY: 213 mg/dL — AB (ref 65–99)
Glucose-Capillary: 196 mg/dL — ABNORMAL HIGH (ref 65–99)
Glucose-Capillary: 203 mg/dL — ABNORMAL HIGH (ref 65–99)

## 2016-10-26 LAB — MAGNESIUM: Magnesium: 2 mg/dL (ref 1.7–2.4)

## 2016-10-26 LAB — RPR: RPR Ser Ql: NONREACTIVE

## 2016-10-26 LAB — CMV DNA, QUANTITATIVE, PCR
CMV DNA Quant: NEGATIVE IU/mL
Log10 CMV Qn DNA Pl: UNDETERMINED log10 IU/mL

## 2016-10-26 LAB — BK QUANT PCR (PLASMA/SERUM)
BK Quantitaion PCR: NEGATIVE copies/mL
Log10 BK Qn PCR: UNDETERMINED log10copy/mL

## 2016-10-26 LAB — INFECT DISEASE AB IGM REFLEX 1

## 2016-10-26 LAB — TOXOPLASMA GONDII ANTIBODY, IGM: Toxoplasma Antibody- IgM: <3 AU/mL (ref 0.0–7.9)

## 2016-10-26 LAB — PHOSPHORUS: Phosphorus: 2.2 mg/dL — ABNORMAL LOW (ref 2.5–4.6)

## 2016-10-26 LAB — HIV ANTIBODY (ROUTINE TESTING W REFLEX): HIV Screen 4th Generation wRfx: NONREACTIVE

## 2016-10-26 MED ORDER — TACROLIMUS 1 MG PO CAPS
3.0000 mg | ORAL_CAPSULE | Freq: Two times a day (BID) | ORAL | Status: DC
  Administered 2016-10-26 – 2016-10-28 (×4): 3 mg via SUBLINGUAL
  Filled 2016-10-26 (×6): qty 3

## 2016-10-26 MED ORDER — TACROLIMUS 1 MG PO CAPS
3.0000 mg | ORAL_CAPSULE | Freq: Two times a day (BID) | ORAL | Status: DC
  Filled 2016-10-26 (×5): qty 3

## 2016-10-26 NOTE — Progress Notes (Signed)
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  Objective Vital signs in last 24 hours: Vitals:   10/26/16 0605 10/26/16 0610 10/26/16 0800 10/26/16 0819  BP: (!) 146/102 (!) 148/98 (!) 165/131   Pulse:   97   Resp: (!) 26 (!) 29 (!) 43   Temp:   99 F (37.2 C)   TempSrc:   Axillary   SpO2: 98% 98% 94% 96%  Weight:      Height:       Weight change:   Intake/Output Summary (Last 24 hours) at 10/26/16 1034 Last data filed at 10/26/16 0900  Gross per 24 hour  Intake          1451.25 ml  Output             1975 ml  Net          -523.75 ml   Physical Exam: Cheyne stokes breathing pattern Attempts 1 word answers inconsistently VS as noted BP elevated Regular rhythm S1S2 No S3 Tachy 90's Abd soft.  No obvious allograft tenderness/transplant left pelvis firm and not tender No LE edema   Recent Labs Lab 10/24/16 0728 10/25/16 0732 10/26/16 0144  NA 147* 152* 155*  K 3.8 3.8 3.8  CL 109 115* 120*  CO2 24 24 26   GLUCOSE 285* 124* 221*  BUN 27* 30* 34*  CREATININE 1.90* 1.64* 1.71*  CALCIUM 9.1 10.0 10.1  PHOS 1.8* 1.5* 2.2*     Recent Labs Lab 10/24/16 0728 10/25/16 0732 10/26/16 0144  AST 110* 145* 113*  ALT 52 60 62  ALKPHOS 55 69 56  BILITOT 1.2 0.9 0.8  PROT 6.5 7.3 6.9  ALBUMIN 2.9* 2.9* 2.8*     Recent Labs Lab 10/22/16 6440 10/23/16 0231 10/24/16 0728 10/25/16 0732 10/26/16 0144  WBC 8.4 8.1 10.4 10.5 8.4  NEUTROABS  --  7.1  --  9.5* 7.2  HGB 14.3 18.1* 12.1* 14.6 14.2  HCT 42.8 55.5* 36.6* 43.6 43.9  MCV 89.0 89.5 87.6 87.6 89.2  PLT 140* 91* 121* 107* 143*     Recent Labs Lab 09/29/2016 1919 09/26/2016 2325 10/22/16 0638 10/23/16 1022  CKTOTAL 9,711*  --  12,724* 6,006*  CKMB  --   --   --  6.6*  TROPONINI 0.13* 0.17* 0.22* 0.12*   CBG:  Recent Labs Lab 10/25/16 1610 10/25/16 1910 10/25/16 2316 10/26/16 0335  10/26/16 0805  GLUCAP 156* 176* 183* 196* 159*   Studies/Results: Dg Chest Port 1 View  Result Date: 10/26/2016 CLINICAL DATA:  Shortness of Breath EXAM: PORTABLE CHEST 1 VIEW COMPARISON:  October 24, 2016 FINDINGS: There is atelectatic change in the left base. There is no appreciable edema or consolidation. There is cardiomegaly with pulmonary vascularity within normal limits. There is aortic atherosclerosis. There is a left subclavian vein stent. No bone lesions. No evident adenopathy. IMPRESSION: Left base atelectasis. No edema or consolidation. Stable cardiac prominence. There is aortic atherosclerosis. Aortic Atherosclerosis (ICD10-I70.0). Electronically Signed   By: Lowella Grip III M.D.   On: 10/26/2016 07:38   Dg Abd Portable 2v  Result Date: 10/26/2016 CLINICAL DATA:  Abdominal distention and decreased bowel sounds. EXAM: PORTABLE ABDOMEN - 2 VIEW COMPARISON:  09/26/2016 FINDINGS: Scattered gas and stool in the colon. No small or large bowel distention. No radiopaque stones. Visualized bones appear intact. Surgical clips in the left upper quadrant and  pelvis. Thoracolumbar scoliosis convex towards the right. Vascular calcifications. Vascular stent noted in the upper mediastinum. IMPRESSION: Nonobstructive bowel gas pattern. Electronically Signed   By: Lucienne Capers M.D.   On: 10/26/2016 01:44   Medications: Infusions: . ampicillin-sulbactam (UNASYN) IV Stopped (10/26/16 0920)  . dextrose 75 mL/hr at 10/25/16 2014  . lacosamide (VIMPAT) IV Stopped (10/26/16 1030)  . levETIRAcetam Stopped (10/26/16 0905)  . mycophenolate (CELLCEPT) IV Stopped (10/26/16 0809)  . valproate sodium 500 mg (10/26/16 0505)    Scheduled Medications: . cholecalciferol  2,000 Units Oral Daily  . dexamethasone  4 mg Intravenous Q6H  . hydrALAZINE  10 mg Intravenous Q6H  . [START ON 11/10/2016] Influenza vac split quadrivalent PF  0.5 mL Intramuscular Tomorrow-1000  . insulin aspart  0-9 Units  Subcutaneous Q4H  . ipratropium  0.5 mg Nebulization TID  . levalbuterol  0.63 mg Nebulization TID  . pravastatin  20 mg Oral q1800  . sodium chloride flush  3 mL Intravenous Q12H  . tacrolimus  4 mg Oral BID   Or  . tacrolimus  4 mg Sublingual BID  . 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 Baseline creatinine between 1.5 and 1.7. Pred/prograf/myfortic. 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 Presented to ED with left-sided weakness, admitted with sz- brain imaging possible/probable frontal lobe brain mass  1. Renal xpl #2 - BL creatinine 1.5-1.7. BK reactivation. AKI on CKD (creatinine 2.29 on admission) on setting of hospitalization for possibly stroke/seizure/possible brain tumor with decreased mental status. Urinalysis is unremarkable so not making me think of rejection. Pharmacy assistance to get his antirejection medications changed to alternate form. Creatinine at BL. TAC trough 10.3. Reduce prograf dosing to 3 mg BID and recheck level in a few days. 2. Metabolic acidosis - has corrected. Bicarb drip off. Watch chemistries. 3. Hypernatremia - D5 at 75 started yesterday. No improvement. ^ rate to 100. 4. Frontal lobe lesion/left sided weakness/lethargy - worrisome for brain tumor (vs infection). MRI still pending (yet unable to complete). On anti seizure meds. Steroids for vasogenic edema. CT chest/abd and pelvis neg for mets. Neuro involved. On immune suppression so infectious disease should be in the differential, brain mass due to infection ??. White blood count is slightly up but diff to interpret. Has been started on empiric Zosyn->changed to Unasyn. EBV, BK, CMV PCR's all pending. 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  5. Systolic and diastolic HF - + fluid balance 2/2 bicarb drip. (drip stopped, lasix not resumed) 6. HTN -  BP remains elevated. Has IV hydralazine ordered, mildly tachycardic. Uses labetolol at home. Add standing IV labetolol rather than prn.    Jamal Maes, MD [facility] [phone] Pager 10/26/2016, 10:34 AM

## 2016-10-26 NOTE — Progress Notes (Signed)
Neurology Progress Note   S:// No acute events overnight. Continues to have intermittent agitation and hence MRI not done overnight. plan for MRI sometime today.   O:// Current vital signs: BP (!) 148/98 (BP Location: Right Arm)   Pulse 97   Temp 98.7 F (37.1 C) (Axillary)   Resp (!) 29   Ht 5\' 10"  (1.778 m)   Wt 93 kg (205 lb 0.4 oz)   SpO2 96%   BMI 29.42 kg/m  Vital signs in last 24 hours: Temp:  [98.4 F (36.9 C)-98.9 F (37.2 C)] 98.7 F (37.1 C) (10/02 0400) Pulse Rate:  [97-106] 97 (10/02 0400) Resp:  [16-40] 29 (10/02 0610) BP: (138-194)/(98-114) 148/98 (10/02 0610) SpO2:  [93 %-100 %] 96 % (10/02 0819) Awake, alert, follows simple commands. Able to answer some questions such as name but unable to answer orientation questions. Perseverates continuously. Cranial nerves: Ocular movements intact, visual. Oh, face symmetric, pupils were round reactive to light. Motor exam: Right upper extremity antigravity. Left upper extremity barely antigravity 3/5. Did not cooperate with testing the lower extremity is. Sensory: Grimaces to noxious stim equally in all 4 extremity is. Unable to test coordination or gait.  Medications  Current Facility-Administered Medications:  .  acetaminophen (TYLENOL) tablet 650 mg, 650 mg, Oral, Q6H PRN **OR** acetaminophen (TYLENOL) suppository 650 mg, 650 mg, Rectal, Q6H PRN, Opyd, Ilene Qua, MD, 650 mg at 10/22/16 1506 .  Ampicillin-Sulbactam (UNASYN) 3 g in sodium chloride 0.9 % 100 mL IVPB, 3 g, Intravenous, Q6H, Lyndee Leo, RPH, Stopped at 10/26/16 0417 .  bisacodyl (DULCOLAX) suppository 10 mg, 10 mg, Rectal, Daily PRN, Opyd, Ilene Qua, MD, 10 mg at 10/26/16 0511 .  camphor-menthol (SARNA) lotion, , Topical, PRN, Jani Gravel, MD, 1 application at 03/54/65 0055 .  cholecalciferol (VITAMIN D) tablet 2,000 Units, 2,000 Units, Oral, Daily, Opyd, Ilene Qua, MD, 2,000 Units at 10/24/16 0904 .  dexamethasone (DECADRON) injection 4 mg, 4 mg,  Intravenous, Q6H, Greta Doom, MD, 4 mg at 10/26/16 0511 .  dextrose 5 % solution, , Intravenous, Continuous, Raiford Noble [place], [place], Last Rate: 75 mL/hr at 10/25/16 2014 .  diphenhydrAMINE (BENADRYL) injection 25 mg, 25 mg, Intravenous, Q6H PRN, Jani Gravel, MD, 25 mg at 10/25/16 1615 .  hydrALAZINE (APRESOLINE) injection 10 mg, 10 mg, Intravenous, Q6H, Corliss Parish, MD, 10 mg at 10/26/16 0344 .  [START ON 11/23/2016] Influenza vac split quadrivalent PF (FLUZONE HIGH-DOSE) injection 0.5 mL, 0.5 mL, Intramuscular, Tomorrow-1000, Sheikh, Omair Latif, DO .  insulin aspart (novoLOG) injection 0-9 Units, 0-9 Units, Subcutaneous, Q4H, Raiford Noble [place], [place], 2 Units at 10/26/16 0344 .  ipratropium (ATROVENT) nebulizer solution 0.5 mg, 0.5 mg, Nebulization, TID, Sheikh, Omair Latif, DO, 0.5 mg at 10/26/16 0819 .  labetalol (NORMODYNE,TRANDATE) injection 10 mg, 10 mg, Intravenous, Q2H PRN, Raiford Noble Latif, DO, 10 mg at 10/26/16 0504 .  lacosamide (VIMPAT) 200 mg in sodium chloride 0.9 % 25 mL IVPB, 200 mg, Intravenous, Q12H, Greta Doom, MD, Stopped at 10/25/16 2328 .  levalbuterol (XOPENEX) nebulizer solution 0.63 mg, 0.63 mg, Nebulization, TID, Raiford Noble Latif, DO, 0.63 mg at 10/26/16 6812 .  levETIRAcetam (KEPPRA) 750 mg in sodium chloride 0.9 % 100 mL IVPB, 750 mg, Intravenous, Q12H, Greta Doom, MD, Stopped at 10/25/16 2220 .  morphine 4 MG/ML injection 1-3 mg, 1-3 mg, Intravenous, Q4H PRN, Opyd, Ilene Qua, MD, 2 mg at 10/24/16 0905 .  mycophenolate (CELLCEPT) 250 mg in dextrose 5 % 42.5 mL  IVPB, 250 mg, Intravenous, Q12H, Lyndee Leo, [facility], Last Rate: 21.3 mL/hr at 10/26/16 0609, 250 mg at 10/26/16 0609 .  ondansetron (ZOFRAN) tablet 4 mg, 4 mg, Oral, Q6H PRN **OR** ondansetron (ZOFRAN) injection 4 mg, 4 mg, Intravenous, Q6H PRN, Opyd, Timothy S, MD .  pravastatin (PRAVACHOL) tablet 20 mg, 20 mg, Oral, q1800, Opyd, Ilene Qua, MD, 20 mg at 10/24/16  1733 .  senna-docusate (Senokot-S) tablet 1 tablet, 1 tablet, Oral, QHS PRN, Opyd, Timothy S, MD .  sodium chloride flush (NS) 0.9 % injection 3 mL, 3 mL, Intravenous, Q12H, Opyd, Ilene Qua, MD, 3 mL at 10/25/16 2223 .  tacrolimus (PROGRAF) capsule 4 mg, 4 mg, Oral, BID, 4 mg at 10/24/16 2023 **OR** tacrolimus (PROGRAF) capsule 4 mg, 4 mg, Sublingual, BID, Corliss Parish, MD, 4 mg at 10/25/16 2132 .  tamsulosin (FLOMAX) capsule 0.4 mg, 0.4 mg, Oral, QPC supper, Opyd, Timothy S, MD, 0.4 mg at 10/24/16 1733 .  valproate (DEPACON) 500 mg in dextrose 5 % 50 mL IVPB, 500 mg, Intravenous, Q8H, Greta Doom, MD, Last Rate: 55 mL/hr at 10/26/16 0505, 500 mg at 10/26/16 0505 Labs CBC    Component Value Date/Time   WBC 8.4 10/26/2016 0144   RBC 4.92 10/26/2016 0144   HGB 14.2 10/26/2016 0144   HGB 9.5 (L) 10/09/2012 1447   HCT 43.9 10/26/2016 0144   HCT 29.1 (L) 10/09/2012 1447   PLT 143 (L) 10/26/2016 0144   PLT 290 10/09/2012 1447   MCV 89.2 10/26/2016 0144   MCV 91 10/09/2012 1447   MCH 28.9 10/26/2016 0144   MCHC 32.3 10/26/2016 0144   RDW 14.1 10/26/2016 0144   RDW 17.9 (H) 10/09/2012 1447   LYMPHSABS 0.7 10/26/2016 0144   LYMPHSABS 0.4 (L) 09/22/2012 0551   MONOABS 0.5 10/26/2016 0144   MONOABS 0.2 09/22/2012 0551   EOSABS 0.0 10/26/2016 0144   EOSABS 0.0 09/22/2012 0551   BASOSABS 0.0 10/26/2016 0144   BASOSABS 0.0 09/22/2012 0551    CMP     Component Value Date/Time   NA 155 (H) 10/26/2016 0144   NA 134 (L) 10/09/2012 1447   K 3.8 10/26/2016 0144   K 5.0 10/11/2012 1044   CL 120 (H) 10/26/2016 0144   CL 94 (L) 10/09/2012 1447   CO2 26 10/26/2016 0144   CO2 33 (H) 10/09/2012 1447   GLUCOSE 221 (H) 10/26/2016 0144   GLUCOSE 78 10/09/2012 1447   BUN 34 (H) 10/26/2016 0144   BUN 33 (H) 10/09/2012 1447   CREATININE 1.71 (H) 10/26/2016 0144   CREATININE 9.78 (H) 10/09/2012 1447   CALCIUM 10.1 10/26/2016 0144   CALCIUM 11.0 (H) 10/09/2012 1447   PROT 6.9  10/26/2016 0144   ALBUMIN 2.8 (L) 10/26/2016 0144   AST 113 (H) 10/26/2016 0144   ALT 62 10/26/2016 0144   ALKPHOS 56 10/26/2016 0144   BILITOT 0.8 10/26/2016 0144   GFRNONAA 38 (L) 10/26/2016 0144   GFRNONAA 5 (L) 10/09/2012 1447   GFRAA 44 (L) 10/26/2016 0144   GFRAA 6 (L) 10/09/2012 1447    glycosylated hemoglobin  Lipid Panel     Component Value Date/Time   CHOL 255 (H) 03/25/2008 2341   TRIG 169 (H) 03/25/2008 2341   HDL 52 03/25/2008 2341   CHOLHDL 4.9 Ratio 03/25/2008 2341   VLDL 34 03/25/2008 2341   LDLCALC 169 (H) 03/25/2008 2341   LDLDIRECT 116.4 12/25/2007 0952     Assessment: 71 year old man with a past history of  chronic combined heart failure, hypertension, history of right kidney cancer, end-stage renal disease status post renal transplant who presented to the ER for evaluation of left-sided weakness. The weakness was after he had new onset of seizures. The weakness on the left side persisted and he continues to be hemiparetic on the left. Brain MRI was attempted but unable to get complete MRI-limited MRI concerning for a right frontal lesion unspecified.  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 Prolonged postictal versus ? Secondary to right frontal mass versus toxic metabolic encephalopathy  Recommendations: From a neurological standpoint, I would recommend continuing Keppra 750 mg twice a day, Vimpat 200 mg twice a day and Depakote 500 mg every 8 hours. MRI brain with and without contrast as soon as possible. We will consider spinal tap after reviewing the MRI. Infectious diseases on board-recommendations being followed by the primary team. We will continue to follow.   -- Amie Portland, MD [facility] [phone]  If 7pm to 7am, please call on call as listed on AMION.

## 2016-10-26 NOTE — Progress Notes (Addendum)
Subjective: unresponsive   Antibiotics:  Anti-infectives    Start     Dose/Rate Route Frequency Ordered Stop   10/25/16 2200  Ampicillin-Sulbactam (UNASYN) 3 g in sodium chloride 0.9 % 100 mL IVPB     3 g 200 mL/hr over 30 Minutes Intravenous Every 6 hours 10/25/16 1501     10/23/16 0800  piperacillin-tazobactam (ZOSYN) IVPB 3.375 g  Status:  Discontinued     3.375 g 12.5 mL/hr over 240 Minutes Intravenous Every 8 hours 10/23/16 0735 10/25/16 1254      Medications: Scheduled Meds: . cholecalciferol  2,000 Units Oral Daily  . dexamethasone  4 mg Intravenous Q6H  . hydrALAZINE  10 mg Intravenous Q6H  . [START ON 11/15/2016] Influenza vac split quadrivalent PF  0.5 mL Intramuscular Tomorrow-1000  . insulin aspart  0-9 Units Subcutaneous Q4H  . ipratropium  0.5 mg Nebulization TID  . levalbuterol  0.63 mg Nebulization TID  . pravastatin  20 mg Oral q1800  . sodium chloride flush  3 mL Intravenous Q12H  . tacrolimus  3 mg Oral BID   Or  . tacrolimus  3 mg Sublingual BID  . tamsulosin  0.4 mg Oral QPC supper   Continuous Infusions: . ampicillin-sulbactam (UNASYN) IV Stopped (10/26/16 1735)  . dextrose 100 mL/hr at 10/26/16 1050  . lacosamide (VIMPAT) IV Stopped (10/26/16 1030)  . levETIRAcetam Stopped (10/26/16 0905)  . mycophenolate (CELLCEPT) IV Stopped (10/26/16 0809)  . valproate sodium Stopped (10/26/16 1500)   PRN Meds:.acetaminophen **OR** acetaminophen, bisacodyl, camphor-menthol, diphenhydrAMINE, labetalol, morphine injection, ondansetron **OR** ondansetron (ZOFRAN) IV, senna-docusate    Objective: Weight change:   Intake/Output Summary (Last 24 hours) at 10/26/16 1744 Last data filed at 10/26/16 1705  Gross per 24 hour  Intake          1556.25 ml  Output             2575 ml  Net         -1018.75 ml   Blood pressure (!) 191/97, pulse 97, temperature 98.6 F (37 C), temperature source Axillary, resp. rate (!) 42, height 5\' 10"  (1.778 m), weight  205 lb 0.4 oz (93 kg), SpO2 93 %. Temp:  [98 F (36.7 C)-99 F (37.2 C)] 98.6 F (37 C) (10/02 1610) Pulse Rate:  [97-106] 97 (10/02 0800) Resp:  [16-43] 42 (10/02 1610) BP: (138-194)/(97-131) 191/97 (10/02 1610) SpO2:  [93 %-99 %] 93 % (10/02 1200)  Physical Exam: General: somnolent, arousable but delirious HEENT: anicteric sclera,  EOMI CVS regular rate, normal r,   Chest:  no wheezing resp distress Abdomen:nondistended,  Extremities: edema Neuro: is able to move all extremities weaker on left side  CBC:  CBC Latest Ref Rng & Units 10/26/2016 10/25/2016 10/24/2016  WBC 4.0 - 10.5 K/uL 8.4 10.5 10.4  Hemoglobin 13.0 - 17.0 g/dL 14.2 14.6 12.1(L)  Hematocrit 39.0 - 52.0 % 43.9 43.6 36.6(L)  Platelets 150 - 400 K/uL 143(L) 107(L) 121(L)      BMET  Recent Labs  10/25/16 0732 10/26/16 0144  NA 152* 155*  K 3.8 3.8  CL 115* 120*  CO2 24 26  GLUCOSE 124* 221*  BUN 30* 34*  CREATININE 1.64* 1.71*  CALCIUM 10.0 10.1     Liver Panel   Recent Labs  10/25/16 0732 10/26/16 0144  PROT 7.3 6.9  ALBUMIN 2.9* 2.8*  AST 145* 113*  ALT 60 62  ALKPHOS 69 56  BILITOT 0.9 0.8  Sedimentation Rate No results for input(s): ESRSEDRATE in the last 72 hours. C-Reactive Protein No results for input(s): CRP in the last 72 hours.  Micro Results: Recent Results (from the past 720 hour(s))  MRSA PCR Screening     Status: None   Collection Time: 10/23/16  1:58 AM  Result Value Ref Range Status   MRSA by PCR NEGATIVE NEGATIVE Final    Comment:        The GeneXpert MRSA Assay (FDA approved for NASAL specimens only), is one component of a comprehensive MRSA colonization surveillance program. It is not intended to diagnose MRSA infection nor to guide or monitor treatment for MRSA infections.   Culture, Urine     Status: Abnormal   Collection Time: 10/23/16 11:12 AM  Result Value Ref Range Status   Specimen Description URINE, RANDOM  Final   Special Requests  NONE  Final   Culture MULTIPLE SPECIES PRESENT, SUGGEST RECOLLECTION (A)  Final   Report Status 10/24/2016 FINAL  Final  Culture, blood (Routine X 2) w Reflex to ID Panel     Status: None (Preliminary result)   Collection Time: 10/25/16 12:04 PM  Result Value Ref Range Status   Specimen Description BLOOD LEFT HAND  Final   Special Requests IN PEDIATRIC BOTTLE Blood Culture adequate volume  Final   Culture NO GROWTH 1 DAY  Final   Report Status PENDING  Incomplete  Culture, blood (Routine X 2) w Reflex to ID Panel     Status: None (Preliminary result)   Collection Time: 10/25/16 12:04 PM  Result Value Ref Range Status   Specimen Description BLOOD LEFT HAND  Final   Special Requests IN PEDIATRIC BOTTLE Blood Culture adequate volume  Final   Culture NO GROWTH 1 DAY  Final   Report Status PENDING  Incomplete    Studies/Results: Mr Brain Wo Contrast  Result Date: 10/26/2016 CLINICAL DATA:  Left-sided weakness EXAM: MRI HEAD WITHOUT CONTRAST TECHNIQUE: Multiplanar, multiecho pulse sequences of the brain and surrounding structures were obtained without intravenous contrast. COMPARISON:  Brain MRI 10/10/2016 and head CT 10/22/2016 FINDINGS: The study is degraded by motion, despite efforts to reduce this artifact, including utilization of motion-resistant MR sequences. The findings of the study are interpreted in the context of reduced sensitivity/specificity. Brain: The midline structures are normal. There is expansion of the right superior frontal gyrus with associated hyperintense T2 weighted signal. No corresponding diffusion restriction. Brain parenchymal signal is otherwise normal. Moderate diffuse atrophy. The dura is normal and there is no extra-axial collection. Vascular: Limited visualization of flow voids is normal. Skull and upper cervical spine: The visualized skull base, calvarium, upper cervical spine and extracranial soft tissues are normal. Sinuses/Orbits: No fluid levels or advanced  mucosal thickening. No mastoid or middle ear effusion. Normal orbits. IMPRESSION: 1. Markedly motion degraded examination. 2. Expanded, T2 hyperintense right superior frontal gyrus. The appearance is most suggestive of a neoplastic lesion. However, characterization is again poor due to patient motion. A follow-up examination with more adequate sedation (or anesthesia) and the administration of intravenous contrast material (if not contraindicated) would likely provide better characterization. 3. No diffusion restriction or other evidence of acute ischemia. Electronically Signed   By: Ulyses Jarred M.D.   On: 10/26/2016 16:12   Dg Chest Port 1 View  Result Date: 10/26/2016 CLINICAL DATA:  Shortness of Breath EXAM: PORTABLE CHEST 1 VIEW COMPARISON:  October 24, 2016 FINDINGS: There is atelectatic change in the left base. There is no appreciable edema  or consolidation. There is cardiomegaly with pulmonary vascularity within normal limits. There is aortic atherosclerosis. There is a left subclavian vein stent. No bone lesions. No evident adenopathy. IMPRESSION: Left base atelectasis. No edema or consolidation. Stable cardiac prominence. There is aortic atherosclerosis. Aortic Atherosclerosis (ICD10-I70.0). Electronically Signed   By: Lowella Grip III M.D.   On: 10/26/2016 07:38   Dg Abd Portable 2v  Result Date: 10/26/2016 CLINICAL DATA:  Abdominal distention and decreased bowel sounds. EXAM: PORTABLE ABDOMEN - 2 VIEW COMPARISON:  10/19/2016 FINDINGS: Scattered gas and stool in the colon. No small or large bowel distention. No radiopaque stones. Visualized bones appear intact. Surgical clips in the left upper quadrant and pelvis. Thoracolumbar scoliosis convex towards the right. Vascular calcifications. Vascular stent noted in the upper mediastinum. IMPRESSION: Nonobstructive bowel gas pattern. Electronically Signed   By: Lucienne Capers M.D.   On: 10/26/2016 01:44      Assessment/Plan:  INTERVAL  HISTORY: sp motion degraded MRI "suspcious for neoplasm"   Principal Problem:   Acute focal neurological deficit Active Problems:   Hypokalemia   History of renal transplant   Immunosuppressed status (HCC)   AKI (acute kidney injury) ([place])   CKD (chronic kidney disease), stage III (HCC)   Elevated troponin   Vasogenic brain edema (HCC)   Acute encephalopathy   Chronic combined systolic and diastolic CHF (congestive heart failure) (HCC)   Metabolic acidosis   Acute respiratory failure (HCC)   SIRS (systemic inflammatory response syndrome) (HCC)   Pneumobilia   Acute respiratory distress   Elevated AST (SGOT)   Thrombocytopenia (HCC)    Roger Gibson is a 71 y.o. male with history of nephrectomy for Renal cell carcinoma, ESRD on HD, then sp renal transplant followed at [facility], with transplant course complicated by BK viremia, who is admitted with an apparent new onset seizure and left sided hemiparesis. He had CT without contrast on admission that raised question of right frontal lesion but imaging has been compromised by patient moving. Most recent MRI is mentioned as being suspicious for malignancy.    #1 Right frontal lobe lesion  HE NEEDS TISSUE SAMPLING to establish a diagnosis  One could try to get an LP as an alternative route to diagnosis but I think  NOT PURSUING a biopsy is NOT appropriate  pathway in patient who is HIGHLY immuno-compromised IF AGGRESSIVE care is being pursued  For ex: AFB and FUNGAL CULTURES on LP are EXTREMELY LOW YIELD even when LARGE VOLUME CSF sent  Toxoplasma is conceivable (if he has amnestic immune system since abs are negative   PML due to JC virus is certainly possible--and would charateristicaly only involve white matter  If LP pursued would certainly send  LARGE 40 ML OF CSF for   Cell count and differential Protein and glucose  10 ml of CSF for AFB stain and culture "" fungal stain and culture Crypto ag Toxoplasma PCR EBV  PCR CMV PCR (CMV negative PCR and EBV + PCR do not tell us about end organ pathology esp in case of CMV)  JC virus PCR (if this is a white matter ONLY lesion)_  CYTOLOGY  I am again very skeptical that the above labs will allow for RAPID diagnosis and the more direct route is proceed to brain biopsy with tissue to path, AFB, fungal, nocardia culture  #2 Pneumobilia: continuing to cover with unasyn for now and will likely get rid of this very soon  #3 Renal failure: his BK virus in  blood is negative,     LOS: 5 days   Alcide Evener 10/26/2016, 5:44 PM

## 2016-10-26 NOTE — Care Management Note (Signed)
Case Management Note  Patient Details  Name: MAHKAI FANGMAN MRN: [id number] Date of Birth: 08/22/1945  Subjective/Objective:     Pt admitted with vasogenic edema and seizures  - on IV vimpat and depacon              Action/Plan:  PTA independent from home with wife.  CIR recommended - CSW consulted for back up plan.    Expected Discharge Date:                  Expected Discharge Plan:  Home/Self Care  In-House Referral:     Discharge planning Services  CM Consult  Post Acute Care Choice:    Choice offered to:     DME Arranged:    DME Agency:     HH Arranged:    HH Agency:     Status of Service:     If discussed at [employer] of Stay Meetings, dates discussed:    Additional Comments:  Maryclare Labrador, RN 10/26/2016, 2:10 PM

## 2016-10-26 NOTE — Progress Notes (Signed)
Still pending MRI.  No new NS recs until MRI has been completed.  Will f/u tomorrow pending MRI.

## 2016-10-26 NOTE — Progress Notes (Signed)
PROGRESS NOTE    Roger Gibson  ELF:[id number] DOB: [birth year] DOA: 09/27/2016 PCP: [name], MD   Brief Narrative:  Roger Gibson is a 71 y.o. male with medical history significant for hypertension, chronic combined systolic/diastolic CHF, history of right kidney cancer, end-stage renal disease status post renal transplantation, that presented to the emergency department with left-sided weakness. History is obtained from the patient's wife at the bedside. He had reportedly been in his usual state last night, driving them to dinner and back home. He went to bed as usual around 11 PM, was noted to sleep in which is not unusual for him, but then called out to his wife at approximately 1 PM, reporting difficulty getting out of bed. She had noted earlier that he was sleeping with his left leg hanging off the bed, and she was not strong enough to help stand him up. A neighbor came to help get the patient up, at which point he was noted to have dried blood caked about his mouth, and EMS was called. Patient complained of some abdominal discomfort with EMS, but had not been expressing any recent complaints aside from some minor knee pain.  Upon arrival to the ED, patient is found to be afebrile, saturating well on room air, slightly tachypneic, and with vitals otherwise stable. Noncontrast head CT reveals a small right frontal lobe abnormality of uncertain etiology. This was followed by MRI brain which was aborted early due to patient intolerance, but notable for abnormal signal at the right superior frontal gyrus consistent with vasogenic edema. Neurology was consulted and patient has undergone EEG. He was treated with a liter of normal saline in the ED. He was admitted to the telemetry unit for ongoing evaluation and management of acute neurologic deficits with vasogenic edema noted on a limited MRI and suspected Seizure. Neurology placed patient on Antiepileptics. Troponins increased and Discussed  Case with Cardiology who recommended to obtain an ECHO and outpatient stress test. Currently still don't know what frontal mass with vasogenic edema and is being worked up.   Patient decompensated and was transferred to SDU on 9/28-9/29 and placed on BiPAP. Remained on BiPAP all day yesterday as he remained lethargic. 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 Neurology planning on doing Overnight EEG. I discussed the case with Neurosurgery PA, and patient was placed on their list to see. Will need MRI and may need it under sedation.   Patient was taken off of BiPAP and was on LTM EEG which showed evidence of a structural abnormality in the right hemisphere, with epileptogenic potential and suggestion of acute-subacute injury. There is periodic  pattern of sharp waves ("PLEDs"). This is indicative of a high risk of seizure occurrence, but no distinct seizures are seen. Neurology decreased Keppra and added Depakote today. Patient still go for MRI of Brain.   Patient remained confused and given his Immunocompromised state Infectious Diseases was consulted for additional recommendations. Patient now went for MRI and it was again motion degraded. Will ask Anesthesia to evaluate to get MRI.   Assessment & Plan:   Principal Problem:   Acute focal neurological deficit Active Problems:   Hypokalemia   History of renal transplant   Immunosuppressed status (HCC)   AKI (acute kidney injury) ([place])   CKD (chronic kidney disease), stage III (HCC)   Elevated troponin   Vasogenic brain edema (HCC)   Acute encephalopathy   Chronic combined systolic and diastolic CHF (congestive  heart failure) (HCC)   Metabolic acidosis   Acute respiratory failure (HCC)   SIRS (systemic inflammatory response syndrome) (HCC)   Pneumobilia   Acute respiratory distress   Elevated AST (SGOT)   Thrombocytopenia (HCC)  1. Acute neurologic event suspect Seizure  with Todd's Paralysis in the setting of an ill-defined hypodensity in the Frontal Lobe with slight vasogenic brain edema  - Pt presented with left-sided weakness after he was last seen normal at 23:00 the night before  - Dried blood about the mouth raised suspicion for seizure and EEG was performed,  - EEG recorded evidence of orbital dysfunction in the right posterior quadrant, suggestive of irritability. No seizure was recorded - CT Code Stroke with Small RIGHT frontal lobe infarct versus venous hypertension and cortical vein thrombosis versus metastasis.Moderate atrophy and mild chronic small vessel ischemic disease;  - MRI limited as was aborted early d/t pt intolerance, but revealed abnormal signal at the right superior frontal gyrus corresponding to the CT finding, but compatible with vasogenic edema rather than acute or subacute infarct. - MRA done but showed Severely motion degraded examination without internal carotid artery or basilar artery occlusion. Nondiagnostic for assessment of emergent large vessel occlusion. - Head CT ordered by Neurology this Afternoon showed Ill-defined hypodensity in the high right frontal lobe unchanged. The MRI was not diagnostic. This could represent edema related to infarct or tumor. There is mild mass-effect and effacement of the sulci.  - C/w Dexamethasone 4 mg IV q6h per Neuro - Per Neuro C/w Lacosamide 200 mg IV q12h, Levetiracetam 750 mg IV q12h, and [place]ate 500 mg IV q8h - Neurology consulted will follow-up on recommendations  - Unable to MRI/MRA as patient decompensated and ended up on BiPAP - Continue seizure precautions, continue frequent neuro checks, supportive care  - SLP done and initially recommended Dysphagia 1 Puree Diet with Thin Liquids but now recommending NPO -CT Chest/Abd/Pelvis done and showed no evidence of metastatic disease -Checking UDS -Neurology ordering Overnight EEG and LTM EEG showed evidence of a structural abnormality in  the right hemisphere, with epileptogenic potential and suggestion of acute-subacute injury. There is periodic  pattern of sharp waves ("PLEDs"). This is indicative of a high risk of seizure occurrence, but no distinct seizures are seen.  -C/w Seizure Precautions  -Repeat MRI of Brain done and showed markedly motion degraded examination and showed expanded T2 Hyperintense Right Superior Frontal gyrus most suggestive of Neoplastic lesion; There was no diffusion restriction or other evidence of acute ischemia per read. 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 Dr. Tommy Medal to send Toxoplasma IgG on serum as well as RPR, and HIV.  Ultimately if CNS lesion is c/w possible infection again we could do an LP and send for Toxoplasma by PCR, CMV PCR, EBV PCR (if it looks like primary CNS lympoma) crypto ag, fungal, afb cultures --BUT we would not want to get a DEFINITIVE diagnosis through brain biopsy rather than waiting on this CSF tests.  -IV Zosyn changed to IV Unasyn by ID  2. ESRD s/p Renal Transplantation -  SCr is 2.29 on admission, up from 1.5 in August 2018  - He has Hx of right renal cancer and ESRD, now with transplant kidney  - Nephrology Dr. Moshe Cipro consulted for further evaluation and Recc's - Low-dose Prednisone 5 mg po Daily, Mycophenolate 180 mg po BID, and Tacrolimus mg po BID changed to IV Forms but don't have IV Tacrolimus so will give Sublingually; Nephrology reducing Prograf level to 3 mg po BID due to  -Prograf Level Level was 10.3 -CT Scan  showed Transplanted left kidney with mild ectasia of the intrarenal collecting system. Mild soft tissue edema and induration, nonspecific of the overlying left lower quadrant soft tissues and flank. Surgically absent native kidneys.  3. Acute Kidney Injury Superimposed on CKD stage III  - SCr is 2.29 on admission, up from 1.5 in August 2018  - He was given a liter of NS in ED and was continued on IVF hydration overnight; IVF Changed to Bicarb gtt - BUN/Cr went from 23/2.29 -> 18/1.80 -> 28/2.18 -> 27/1.90 -> 30/1.64 -> 34/1.71 - Continue immunosuppressants as above - Obtain CT Chest/Abd/Pelvis w/o Contrast as below - Renal U/S showed Left nephrectomy. Right kidney is not identified and is likely surgically absent. Hydronephrosis of the transplant kidney in the left renal fossa - U/A appeared to have no infection; Urine Cx pending  - Repeat CMP in AM  4. Hypokalemia  - Serum potassium 3.3 on admission; Improved to 3.8 - Continue to Monitor and Replete as Necessary  - Repeat CMP in AM  5. Chronic Combined Systolic/Diastolic CHF  - Pt appeared hypovolemic on arrival and was treated with 1 L NS in ED  - Hold Lasix initially,  - Follow strict I/O's and daily wts,  -Patient is +1.3925 Liters  - Resume beta-blocker as tolerated  - C/w IVF rehydration with D5W and monitor   6. Hypertension  - BP at goal in ED, but increased today   - Managed at home with labetalol 400 mg po TID; Changed to IV Labetalol 10 mg IV q2hprn - Will use prn labetalol IVP's until passes swallow screen; Nephrology also added q6hprn Labetolol  - Nephrology starting scheduled Hydralazine to hold if SBP <140    7. Elevated Troponin - Troponin elevated to 0.10 on admission and increased to 0.22 along with CK which was 12,724; Repeat Trended down - No anginal complaints to suggest ACS  - Plan to continue cardiac monitoring - Ordered ECHOCardiogram and showed EF of 55-60% and Grade 1 DD - Discussed Case with  Cardiologist Dr. Oval Linsey who evaluated patient's EKG and recommends not to continue to trend troponin and recommends outpatient Stress Test and Evaluation - Patient scheduled for follow at D/C with Cardiologist   8. Metabolic Acidosis, improved -Unclear Etiology -Bicarb gtt stopped as CO2 was 26 -Repeat CMP in AM  9. Acute Respiratory Failure requiring NIPPV, improved -Patient decompensated requiring BiPAP; ? Related to Seizures vs other etiology  -ABG looked ok and showed 7.379/22.8/88.5/11.0/96.3% -CT Scan showed Centrilobular and paraseptal emphysema of the lungs without dominant mass. Mild pleural thickening and calcific plaque along the right lung base is stable -Attempt to Wean BiPAP -Repeat CXR in AM  10. SIRS -Patient was mildly Febrile, Tachycardic, Tachypenic  -Blood Cx Pending  -Started Empiric Abx with Zosyn and changed to Unasyn by ID -Follow Cx Data and order Blood Cx for Fever >101 -Ordered Urinalysis and Urine Cx showed multiple species present  11. Mild Intrahepatic/Extrahepatic Pneumobilia -? Related to Gall stone passing;  No Abdominal Pain -Will discuss with General Surgery -Was on Zosyn and now on IV Unasyn   12. Hypernatremia, worsening -Worsening as Na+ went from 147 -> 152 -> 155 -Stopped Fluids; May need NGT for free water Flushes -Started D5W at 75 mL/hr and increased to 100 mL/hr by Nephrology  -Repeat CMP in AM  13. Hyperglycemia -Check HbA1c -Likely reactive in the setting of IV Decadron  -Added Sensitive Novolog SSI q4h as patient is NPO -Continue to monitor CBG's  14. Thrombocytopenia -Patient's Platelet Count went from 121 -> 107 -> 143 -Continue to Monitor and Repeat Platelet Count in AM  15. Abnormal LFT's/ Elevated AST -AST worsening and went from 110 -> 145 -> 113 -? Related to Pneumobilia -IV Abx changed from IV Zosyn to IV Unasyn -Repeat CMP in AM   DVT prophylaxis: SCDs Code Status: FULL CODE Family Communication: Discussed  with wife at bedside Disposition Plan: Remain Inpatient; PT recommending CIR when stable to discharge  Consultants:   Neurology   Discussed Case with Cardiologist Dr. Oval Linsey who recommended outpatient ischemic evaluation/stress test  Nephrology   Neurosurgery   Infectious Diseases    Procedures:  ECHOCARDIOGRAM ------------------------------------------------------------------- Study Conclusions  - Left ventricle: The cavity size was normal. Wall thickness was   increased in a pattern of moderate LVH. Systolic function was   normal. The estimated ejection fraction was in the range of 55%   to 60%. Wall motion was normal; there were no regional wall   motion abnormalities. Doppler parameters are consistent with   abnormal left ventricular relaxation (grade 1 diastolic   dysfunction). - Aortic valve: Mildly calcified annulus. Trileaflet; normal   thickness leaflets. - Mitral valve: There was mild regurgitation. - Left atrium: The atrium was mildly dilated.   EEG EEG Abnormalities: 1) lateralized periodic discharges in the right posterior quadrant with delta wave morphology 2) utilize irregular slow activity  Clinical Interpretation: This EEG recorded evidence of orbital dysfunction in the right posterior quadrant, suggestive of irritability. No seizure was recorded.   Antimicrobials: Anti-infectives    Start     Dose/Rate Route Frequency Ordered Stop   10/25/16 2200  Ampicillin-Sulbactam (UNASYN) 3 g in sodium chloride 0.9 % 100 mL IVPB     3 g 200 mL/hr over 30 Minutes Intravenous Every 6 hours 10/25/16 1501     10/23/16 0800  piperacillin-tazobactam (ZOSYN) IVPB 3.375 g  Status:  Discontinued     3.375 g 12.5 mL/hr over 240 Minutes Intravenous Every 8 hours 10/23/16 0735 10/25/16 1254     Subjective: Seen and examined at bedside and patient was confused and would answer "Yes" repetitively. Was fidgety and trying to scratch himself on his chest.    Objective: Vitals:   10/26/16 0800 10/26/16 0819 10/26/16 1200 10/26/16 1610  BP: (!) 165/131  (!) 145/103 (!) 191/97  Pulse: 97     Resp: (!) 43  (!) 41 (!) 42  Temp: 99 F (37.2 C)  98 F (36.7 C) 98.6 F (37 C)  TempSrc: Axillary  Axillary Axillary  SpO2: 94% 96% 93%   Weight:      Height:        Intake/Output Summary (Last 24 hours) at 10/26/16 1653 Last data filed at 10/26/16 1647  Gross per 24 hour  Intake          1456.25 ml  Output             2575 ml  Net         -  1118.75 ml   Filed Weights   10/23/16 0500 10/24/16 0335  Weight: 90 kg (198 lb 6.6 oz) 93 kg (205 lb 0.4 oz)   Examination: Physical Exam:  Constitutional: WN/WD AAM who continues to be confused and only answering "Yes" Eyes: Sclerae anicteric. Lids normal ENMT: External Ears and nose appear normal. Grossly normal hearing Neck: Supple with no JVD Respiratory: Diminished with no appreciable wheezing/rales/rhonchi Cardiovascular: RRR; No appreciable edema today Abdomen: Soft, NT, Distended due to body habitus. Bowl sounds present GU: Deferred Musculoskeletal: No contractures; No cyanosis Skin: Warm and dry. No rashes or lesions but patient has been scratching upper body Neurologic: Appears to be weak on left side. CN 2-12 grossly intact Psychiatric: Awake and remains confused. Impaired judgement and insight  Data Reviewed: I have personally reviewed following labs and imaging studies  CBC:  Recent Labs Lab 10/23/2016 1345  10/22/16 0638 10/23/16 0231 10/24/16 0728 10/25/16 0732 10/26/16 0144  WBC 7.0  --  8.4 8.1 10.4 10.5 8.4  NEUTROABS 5.3  --   --  7.1  --  9.5* 7.2  HGB 12.9*  < > 14.3 18.1* 12.1* 14.6 14.2  HCT 39.1  < > 42.8 55.5* 36.6* 43.6 43.9  MCV 87.9  --  89.0 89.5 87.6 87.6 89.2  PLT 158  --  140* 91* 121* 107* 143*  < > = values in this interval not displayed. Basic Metabolic Panel:  Recent Labs Lab 10/22/16 0830 10/23/16 0231 10/23/16 1022 10/24/16 0234  10/24/16 0728 10/25/16 0732 10/26/16 0144  NA  --  143 144 147* 147* 152* 155*  K  --  4.6 4.4 4.1 3.8 3.8 3.8  CL  --  112* 112* 111 109 115* 120*  CO2  --  13* 17* 26 24 24 26   GLUCOSE  --  164* 241* 288* 285* 124* 221*  BUN  --  21* 28* 31* 27* 30* 34*  CREATININE  --  2.10* 2.18* 1.90* 1.90* 1.64* 1.71*  CALCIUM  --  9.4 9.2 9.1 9.1 10.0 10.1  MG 1.9 2.1  --   --  2.1 2.1 2.0  PHOS 3.0 3.6  --  2.0* 1.8* 1.5* 2.2*   GFR: Estimated Creatinine Clearance: 44.7 mL/min (A) (by C-G formula based on SCr of 1.71 mg/dL (H)). Liver Function Tests:  Recent Labs Lab 10/22/16 7616 10/23/16 0231 10/24/16 0234 10/24/16 0728 10/25/16 0732 10/26/16 0144  AST 209* 187*  --  110* 145* 113*  ALT 48 58  --  52 60 62  ALKPHOS 63 70  --  55 69 56  BILITOT 1.7* 1.8*  --  1.2 0.9 0.8  PROT 6.8 7.8  --  6.5 7.3 6.9  ALBUMIN 3.7 3.6 2.8* 2.9* 2.9* 2.8*   No results for input(s): LIPASE, AMYLASE in the last 168 hours. No results for input(s): AMMONIA in the last 168 hours. Coagulation Profile:  Recent Labs Lab 10/16/2016 1345  INR 1.08   Cardiac Enzymes:  Recent Labs Lab 10/05/2016 1919 10/01/2016 2325 10/22/16 0638 10/23/16 1022  CKTOTAL 9,711*  --  12,724* 6,006*  CKMB  --   --   --  6.6*  TROPONINI 0.13* 0.17* 0.22* 0.12*   BNP (last 3 results) No results for input(s): PROBNP in the last 8760 hours. HbA1C: No results for input(s): HGBA1C in the last 72 hours. CBG:  Recent Labs Lab 10/25/16 2316 10/26/16 0335 10/26/16 0805 10/26/16 1215 10/26/16 1631  GLUCAP 183* 196* 159* 204* 167*   Lipid Profile:  No results for input(s): CHOL, HDL, LDLCALC, TRIG, CHOLHDL, LDLDIRECT in the last 72 hours. Thyroid Function Tests: No results for input(s): TSH, T4TOTAL, FREET4, T3FREE, THYROIDAB in the last 72 hours. Anemia Panel: No results for input(s): VITAMINB12, FOLATE, FERRITIN, TIBC, IRON, RETICCTPCT in the last 72 hours. Sepsis Labs: No results for input(s): PROCALCITON,  LATICACIDVEN in the last 168 hours.  Recent Results (from the past 240 hour(s))  MRSA PCR Screening     Status: None   Collection Time: 10/23/16  1:58 AM  Result Value Ref Range Status   MRSA by PCR NEGATIVE NEGATIVE Final    Comment:        The GeneXpert MRSA Assay (FDA approved for NASAL specimens only), is one component of a comprehensive MRSA colonization surveillance program. It is not intended to diagnose MRSA infection nor to guide or monitor treatment for MRSA infections.   Culture, Urine     Status: Abnormal   Collection Time: 10/23/16 11:12 AM  Result Value Ref Range Status   Specimen Description URINE, RANDOM  Final   Special Requests NONE  Final   Culture MULTIPLE SPECIES PRESENT, SUGGEST RECOLLECTION (A)  Final   Report Status 10/24/2016 FINAL  Final  Culture, blood (Routine X 2) w Reflex to ID Panel     Status: None (Preliminary result)   Collection Time: 10/25/16 12:04 PM  Result Value Ref Range Status   Specimen Description BLOOD LEFT HAND  Final   Special Requests IN PEDIATRIC BOTTLE Blood Culture adequate volume  Final   Culture NO GROWTH 1 DAY  Final   Report Status PENDING  Incomplete  Culture, blood (Routine X 2) w Reflex to ID Panel     Status: None (Preliminary result)   Collection Time: 10/25/16 12:04 PM  Result Value Ref Range Status   Specimen Description BLOOD LEFT HAND  Final   Special Requests IN PEDIATRIC BOTTLE Blood Culture adequate volume  Final   Culture NO GROWTH 1 DAY  Final   Report Status PENDING  Incomplete     Radiology Studies: Mr Brain 77 Contrast  Result Date: 10/26/2016 CLINICAL DATA:  Left-sided weakness EXAM: MRI HEAD WITHOUT CONTRAST TECHNIQUE: Multiplanar, multiecho pulse sequences of the brain and surrounding structures were obtained without intravenous contrast. COMPARISON:  Brain MRI 10/15/2016 and head CT 10/22/2016 FINDINGS: The study is degraded by motion, despite efforts to reduce this artifact, including utilization  of motion-resistant MR sequences. The findings of the study are interpreted in the context of reduced sensitivity/specificity. Brain: The midline structures are normal. There is expansion of the right superior frontal gyrus with associated hyperintense T2 weighted signal. No corresponding diffusion restriction. Brain parenchymal signal is otherwise normal. Moderate diffuse atrophy. The dura is normal and there is no extra-axial collection. Vascular: Limited visualization of flow voids is normal. Skull and upper cervical spine: The visualized skull base, calvarium, upper cervical spine and extracranial soft tissues are normal. Sinuses/Orbits: No fluid levels or advanced mucosal thickening. No mastoid or middle ear effusion. Normal orbits. IMPRESSION: 1. Markedly motion degraded examination. 2. Expanded, T2 hyperintense right superior frontal gyrus. The appearance is most suggestive of a neoplastic lesion. However, characterization is again poor due to patient motion. A follow-up examination with more adequate sedation (or anesthesia) and the administration of intravenous contrast material (if not contraindicated) would likely provide better characterization. 3. No diffusion restriction or other evidence of acute ischemia. Electronically Signed   By: Ulyses Jarred M.D.   On: 10/26/2016 16:12   Dg  Chest Port 1 View  Result Date: 10/26/2016 CLINICAL DATA:  Shortness of Breath EXAM: PORTABLE CHEST 1 VIEW COMPARISON:  October 24, 2016 FINDINGS: There is atelectatic change in the left base. There is no appreciable edema or consolidation. There is cardiomegaly with pulmonary vascularity within normal limits. There is aortic atherosclerosis. There is a left subclavian vein stent. No bone lesions. No evident adenopathy. IMPRESSION: Left base atelectasis. No edema or consolidation. Stable cardiac prominence. There is aortic atherosclerosis. Aortic Atherosclerosis (ICD10-I70.0). Electronically Signed   By: Lowella Grip III M.D.   On: 10/26/2016 07:38   Dg Abd Portable 2v  Result Date: 10/26/2016 CLINICAL DATA:  Abdominal distention and decreased bowel sounds. EXAM: PORTABLE ABDOMEN - 2 VIEW COMPARISON:  10/04/2016 FINDINGS: Scattered gas and stool in the colon. No small or large bowel distention. No radiopaque stones. Visualized bones appear intact. Surgical clips in the left upper quadrant and pelvis. Thoracolumbar scoliosis convex towards the right. Vascular calcifications. Vascular stent noted in the upper mediastinum. IMPRESSION: Nonobstructive bowel gas pattern. Electronically Signed   By: Lucienne Capers M.D.   On: 10/26/2016 01:44   Scheduled Meds: . cholecalciferol  2,000 Units Oral Daily  . dexamethasone  4 mg Intravenous Q6H  . hydrALAZINE  10 mg Intravenous Q6H  . [START ON 11/12/2016] Influenza vac split quadrivalent PF  0.5 mL Intramuscular Tomorrow-1000  . insulin aspart  0-9 Units Subcutaneous Q4H  . ipratropium  0.5 mg Nebulization TID  . levalbuterol  0.63 mg Nebulization TID  . pravastatin  20 mg Oral q1800  . sodium chloride flush  3 mL Intravenous Q12H  . tacrolimus  3 mg Oral BID   Or  . tacrolimus  3 mg Sublingual BID  . tamsulosin  0.4 mg Oral QPC supper   Continuous Infusions: . ampicillin-sulbactam (UNASYN) IV Stopped (10/26/16 0920)  . dextrose 100 mL/hr at 10/26/16 1050  . lacosamide (VIMPAT) IV Stopped (10/26/16 1030)  . levETIRAcetam Stopped (10/26/16 0905)  . mycophenolate (CELLCEPT) IV Stopped (10/26/16 0809)  . valproate sodium Stopped (10/26/16 1500)    LOS: 5 days   Kerney Elbe, DO [facility] Pager [phone]  If 7PM-7AM, please contact night-coverage [email or web address] Password [email or web address] 10/26/2016, 4:53 PM

## 2016-10-26 NOTE — Progress Notes (Signed)
SLP Cancellation Note  Patient Details Name: Roger Gibson MRN: [id number] DOB: 1945-02-29   Cancelled treatment:       Pt having MRI. Will continue efforts re: dysphagia.   [name] 10/26/2016, 3:08 PM   [name].Ed [employer] [phone]

## 2016-10-26 NOTE — Progress Notes (Signed)
Physical Therapy Treatment Patient Details Name: Roger Gibson MRN: [id number] DOB: Feb 19, 1945 Today's Date: 10/26/2016    History of Present Illness Roger Gibson is a 71 y.o. male with medical history significant for hypertension, chronic combined systolic/diastolic CHF, history of right kidney cancer, end-stage renal disease status post renal transplantation, that presented to the ER with left-sided weakness. He had reportedly been in his usual state night of admit, driving them to dinner and back home. He went to bed as usual around 11 PM, was noted to sleep in which is not unusual for him, but then called out to his wife at approximately 1 PM, reporting difficulty getting out of bed. She had noted earlier that he was sleeping with his left leg hanging off the bed, and she was not strong enough to help stand him up. A neighbor came to help get the patient up, at which point he was noted to have dried blood caked about his mouth, and EMS was called. Patient complained of some abdominal discomfort with EMS, but had not been expressing any recent complaints aside from some minor knee pain.Upon arrival to the ED, patient is found to be afebrile, saturating well on room air, slightly tachypneic, and with vitals otherwise stable. Noncontrast head CT reveals a small right frontal lobe abnormality of uncertain etiology. This was followed by MRI brain which was aborted early due to patient intolerance, but notable for abnormal signal at the right superior frontal gyrus consistent with vasogenic edema. Neurology was consulted and patient has undergone EEG.  He was admitted to the telemetry unit for ongoing evaluation and management of acute neurologic deficits with vasogenic edema noted on a limited MRI and suspected Seizure. Neurology placed patient on Antiepileptics. Troponins increased and Discussed Case with Cardiology who recommended to obtain an ECHO and outpatient stress test.  Patient decompensated and  was transferred to SDU on 9/28-9/29 and placed on BiPAP. Remained on BiPAP 9/29 as he remained lethargic. 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 Patient was taken off of BiPAP and was on LTM EEG which showed evidence of a structural abnormality in the right hemisphere, with epileptogenic potential and suggestion of acute-subacute injury. There is periodic  pattern of sharp waves ("PLEDs"). This is indicative  of a high risk of seizure occurrence, but no distinct seizures are seen. Neurology decreased Keppra and added Depakote today. Patient still go for MRI of Brain.     PT Comments    Pt admitted with above diagnosis. Pt currently with functional limitations due to balance and endurance deficits as well as cognitive deficits.Pt was more lethargic today.  Sat EOB 10 min but pt remains total assist and only looked at therapist to command.  Motor planning still appears to be an issue with pt but difficult to determine given level of arousal of pt.  MRI still pending.  Will follow acutely and progress as able.   Pt will benefit from skilled PT to increase their independence and safety with mobility to allow discharge to the venue listed below.     Follow Up Recommendations  CIR;Supervision/Assistance - 24 hour     Equipment Recommendations  Other (comment) (tBA)    Recommendations for Other Services Rehab consult     Precautions / Restrictions Precautions Precautions: Fall (seizure) Restrictions Weight Bearing Restrictions: No    Mobility  Bed Mobility Overal bed mobility: Needs Assistance Bed Mobility: Supine to Sit     Supine to sit:  Total assist;+2 for physical assistance     General bed mobility comments: Pt unable to assist. Will verbally acknowledge ccommand or repeat command.                   General transfer comment: Unsafe to attempt                               Modified Rankin  (Stroke Patients Only) Modified Rankin (Stroke Patients Only) Pre-Morbid Rankin Score: No symptoms Modified Rankin: Severe disability     Balance Overall balance assessment: Needs assistance Sitting-balance support: No upper extremity supported;Feet supported Sitting balance-Leahy Scale: Zero Sitting balance - Comments: Pt sat EOB 10 min total assist.  Could not get pt to respond to any commands except to "look at therapist and open eyes" and pt inconsistently following this command.  Pt could not obtain balance needing total assist entire treatment.   Postural control: Right lateral lean;Posterior lean;Left lateral lean                                  Cognition Arousal/Alertness: Lethargic Behavior During Therapy: Flat affect Overall Cognitive Status: Impaired/Different from baseline Area of Impairment: Orientation;Following commands;Safety/judgement;Awareness;Problem solving;Attention                 Orientation Level: Disoriented to;Place;Time;Situation Current Attention Level: Focused   Following Commands: Follows one step commands inconsistently Safety/Judgement: Decreased awareness of safety;Decreased awareness of deficits Awareness:  (pre-intellectual) Problem Solving: Difficulty sequencing;Decreased initiation;Slow processing;Requires verbal cues;Requires tactile cues General Comments: Pt repeats commands at times with delayed response.  Pt appears to have issues with motor planning. Unsure if deficits of communication deficits, decreased ability to follow commands, or motor planning.       Exercises              Pertinent Vitals/Pain Pain Assessment: No/denies pain  101-121 bpm, BP 139/84 on arrival; 143/115 on departure.  91% on RA.  At times pt RR as high as 51 with pt taking short shallow breaths and at times 23.  Varied throughout treatment.                                     PT Goals (current goals can now be found in the  care plan section) Progress towards PT goals: Not progressing toward goals - comment (more lethargic today)    Frequency    Min 4X/week      PT Plan Current plan remains appropriate    Co-evaluation              AM-PAC PT "6 Clicks" Daily Activity  Outcome Measure  Difficulty turning over in bed (including adjusting bedclothes, sheets and blankets)?: Unable Difficulty moving from lying on back to sitting on the side of the bed? : Unable Difficulty sitting down on and standing up from a chair with arms (e.g., wheelchair, bedside commode, etc,.)?: Unable Help needed moving to and from a bed to chair (including a wheelchair)?: Total Help needed walking in hospital room?: Total Help needed climbing 3-5 steps with a railing? : Total 6 Click Score: 6    End of Session   Activity Tolerance: Patient limited by fatigue Patient left: in bed;with call bell/phone within reach Nurse Communication: Mobility status;Need for lift equipment PT Visit  Diagnosis: Muscle weakness (generalized) (M62.81);Hemiplegia and hemiparesis Hemiplegia - Right/Left: Left Hemiplegia - dominant/non-dominant: Non-dominant Hemiplegia - caused by: Unspecified (still awaiting MRI )     Time: 7981-0254 PT Time Calculation (min) (ACUTE ONLY): 34 min  Charges:  $Therapeutic Activity: 23-37 mins                    G Codes:       Osborne Serio,PT Acute Rehabilitation [phone] (pager)    Denice Paradise 10/26/2016, 11:19 AM

## 2016-10-27 ENCOUNTER — Inpatient Hospital Stay (HOSPITAL_COMMUNITY): Payer: Medicare HMO | Admitting: Certified Registered"

## 2016-10-27 ENCOUNTER — Encounter (HOSPITAL_COMMUNITY): Payer: Self-pay | Admitting: Certified Registered"

## 2016-10-27 ENCOUNTER — Encounter (HOSPITAL_COMMUNITY): Admission: EM | Disposition: E | Payer: Self-pay | Source: Home / Self Care | Attending: Internal Medicine

## 2016-10-27 ENCOUNTER — Inpatient Hospital Stay (HOSPITAL_COMMUNITY): Payer: Medicare HMO

## 2016-10-27 DIAGNOSIS — R1915 Other abnormal bowel sounds: Secondary | ICD-10-CM

## 2016-10-27 DIAGNOSIS — C7931 Secondary malignant neoplasm of brain: Secondary | ICD-10-CM

## 2016-10-27 HISTORY — PX: RADIOLOGY WITH ANESTHESIA: SHX6223

## 2016-10-27 LAB — GLUCOSE, CAPILLARY
GLUCOSE-CAPILLARY: 178 mg/dL — AB (ref 65–99)
GLUCOSE-CAPILLARY: 163 mg/dL — AB (ref 65–99)
Glucose-Capillary: 176 mg/dL — ABNORMAL HIGH (ref 65–99)
Glucose-Capillary: 187 mg/dL — ABNORMAL HIGH (ref 65–99)
Glucose-Capillary: 206 mg/dL — ABNORMAL HIGH (ref 65–99)

## 2016-10-27 LAB — COMPREHENSIVE METABOLIC PANEL
ALBUMIN: 2.5 g/dL — AB (ref 3.5–5.0)
ALT: 52 U/L (ref 17–63)
AST: 66 U/L — AB (ref 15–41)
Alkaline Phosphatase: 62 U/L (ref 38–126)
Anion gap: 10 (ref 5–15)
BUN: 30 mg/dL — ABNORMAL HIGH (ref 6–20)
CALCIUM: 10 mg/dL (ref 8.9–10.3)
CO2: 24 mmol/L (ref 22–32)
CREATININE: 1.34 mg/dL — AB (ref 0.61–1.24)
Chloride: 121 mmol/L — ABNORMAL HIGH (ref 101–111)
GFR calc Af Amer: 59 mL/min — ABNORMAL LOW (ref >60)
GFR, EST NON AFRICAN AMERICAN: 51 mL/min — AB (ref >60)
Glucose, Bld: 198 mg/dL — ABNORMAL HIGH (ref 65–99)
Potassium: 3.4 mmol/L — ABNORMAL LOW (ref 3.5–5.1)
SODIUM: 155 mmol/L — AB (ref 135–145)
Total Bilirubin: 0.9 mg/dL (ref 0.3–1.2)
Total Protein: 6.1 g/dL — ABNORMAL LOW (ref 6.5–8.1)

## 2016-10-27 LAB — CBC WITH DIFFERENTIAL/PLATELET
BASOS PCT: 0 %
Basophils Absolute: 0 10*3/uL (ref 0.0–0.1)
EOS ABS: 0 10*3/uL (ref 0.0–0.7)
Eosinophils Relative: 0 %
HCT: 42.4 % (ref 39.0–52.0)
Hemoglobin: 13.4 g/dL (ref 13.0–17.0)
LYMPHS PCT: 6 %
Lymphs Abs: 0.3 10*3/uL — ABNORMAL LOW (ref 0.7–4.0)
MCH: 28.1 pg (ref 26.0–34.0)
MCHC: 31.6 g/dL (ref 30.0–36.0)
MCV: 88.9 fL (ref 78.0–100.0)
Monocytes Absolute: 0.4 10*3/uL (ref 0.1–1.0)
Monocytes Relative: 8 %
NEUTROS PCT: 86 %
Neutro Abs: 4.8 10*3/uL (ref 1.7–7.7)
PLATELETS: 124 10*3/uL — AB (ref 150–400)
RBC: 4.77 MIL/uL (ref 4.22–5.81)
RDW: 14.7 % (ref 11.5–15.5)
WBC: 5.5 10*3/uL (ref 4.0–10.5)

## 2016-10-27 LAB — MAGNESIUM: Magnesium: 1.7 mg/dL (ref 1.7–2.4)

## 2016-10-27 LAB — PHOSPHORUS: PHOSPHORUS: 2.5 mg/dL (ref 2.5–4.6)

## 2016-10-27 SURGERY — RADIOLOGY WITH ANESTHESIA
Anesthesia: General

## 2016-10-27 MED ORDER — POTASSIUM CHLORIDE 10 MEQ/100ML IV SOLN
10.0000 meq | INTRAVENOUS | Status: AC
  Administered 2016-10-27 (×3): 10 meq via INTRAVENOUS
  Filled 2016-10-27 (×3): qty 100

## 2016-10-27 MED ORDER — ONDANSETRON HCL 4 MG/2ML IJ SOLN: 4.0000 mg | Freq: Once | INTRAMUSCULAR | Status: DC | PRN

## 2016-10-27 MED ORDER — HYDRALAZINE HCL 20 MG/ML IJ SOLN: 10.0000 mg | Freq: Once | INTRAMUSCULAR | Status: DC

## 2016-10-27 MED ORDER — FENTANYL CITRATE (PF) 100 MCG/2ML IJ SOLN: 25.0000 ug | INTRAMUSCULAR | Status: DC | PRN

## 2016-10-27 MED ORDER — LABETALOL HCL 5 MG/ML IV SOLN: 10.0000 mg | Freq: Once | INTRAVENOUS | Status: DC

## 2016-10-27 MED ORDER — LABETALOL HCL 5 MG/ML IV SOLN
INTRAVENOUS | Status: AC
  Filled 2016-10-27: qty 4

## 2016-10-27 MED ORDER — IPRATROPIUM BROMIDE 0.02 % IN SOLN
0.5000 mg | Freq: Two times a day (BID) | RESPIRATORY_TRACT | Status: DC
  Administered 2016-10-27 – 2016-10-28 (×2): 0.5 mg via RESPIRATORY_TRACT
  Filled 2016-10-27 (×2): qty 2.5

## 2016-10-27 MED ORDER — HYDRALAZINE HCL 20 MG/ML IJ SOLN
INTRAMUSCULAR | Status: AC
  Administered 2016-10-27: 10 mg via INTRAVENOUS
  Filled 2016-10-27: qty 1

## 2016-10-27 MED ORDER — LEVALBUTEROL HCL 0.63 MG/3ML IN NEBU
0.6300 mg | INHALATION_SOLUTION | Freq: Two times a day (BID) | RESPIRATORY_TRACT | Status: DC
  Administered 2016-10-27 – 2016-10-31 (×8): 0.63 mg via RESPIRATORY_TRACT
  Filled 2016-10-27 (×9): qty 3

## 2016-10-27 MED ORDER — POTASSIUM CHLORIDE CRYS ER 20 MEQ PO TBCR: 40.0000 meq | EXTENDED_RELEASE_TABLET | Freq: Once | ORAL | Status: DC

## 2016-10-27 MED ORDER — SODIUM CHLORIDE 0.9 % IV SOLN
12.5000 mg | Freq: Once | INTRAVENOUS | Status: AC
  Administered 2016-10-27: 12.5 mg via INTRAVENOUS
  Filled 2016-10-27: qty 0.5

## 2016-10-27 NOTE — Anesthesia Preprocedure Evaluation (Addendum)
Anesthesia Evaluation  Patient identified by MRN, date of birth, ID band Patient confused    Reviewed: Allergy & Precautions, NPO status , Patient's Chart, lab work & pertinent test results  Airway Mallampati: III  TM Distance: >3 FB Neck ROM: Full    Dental  (+) Dental Advisory Given, Poor Dentition, Missing, Chipped   Pulmonary former smoker,    Pulmonary exam normal breath sounds clear to auscultation       Cardiovascular hypertension, + Cardiac Stents and +CHF  Normal cardiovascular exam+ Valvular Problems/Murmurs MR  Rhythm:Regular Rate:Normal  TTE 9/18 - Left ventricle: The cavity size was normal. Wall thickness was  increased in a pattern of moderate LVH. Systolic function was  normal. The estimated ejection fraction was in the range of 55%  to 60%. Wall motion was normal; there were no regional wall  motion abnormalities. Doppler parameters are consistent with   abnormal left ventricular relaxation (grade 1 diastolic   dysfunction). - Aortic valve: Mildly calcified annulus. Trileaflet; normal   thickness leaflets. - Mitral valve: There was mild regurgitation. - Left atrium: The atrium was mildly dilated   Neuro/Psych PSYCHIATRIC DISORDERS negative neurological ROS     GI/Hepatic negative GI ROS, Neg liver ROS,   Endo/Other  negative endocrine ROS  Renal/GU DialysisRenal diseaseRight renal ca.  negative genitourinary   Musculoskeletal negative musculoskeletal ROS (+)   Abdominal   Peds  Hematology  (+) anemia ,   Anesthesia Other Findings Gout  Reproductive/Obstetrics                           Anesthesia Physical Anesthesia Plan  ASA: III  Anesthesia Plan: General   Post-op Pain Management:    Induction: Intravenous  PONV Risk Score and Plan: 2 and Ondansetron, Dexamethasone and Treatment may vary due to age or medical condition  Airway Management Planned: Oral  ETT  Additional Equipment: None  Intra-op Plan:   Post-operative Plan: Extubation in OR  Informed Consent: I have reviewed the patients History and Physical, chart, labs and discussed the procedure including the risks, benefits and alternatives for the proposed anesthesia with the patient or authorized representative who has indicated his/her understanding and acceptance.   Dental advisory given  Plan Discussed with: CRNA  Anesthesia Plan Comments:        Anesthesia Quick Evaluation

## 2016-10-27 NOTE — Progress Notes (Signed)
OT Cancellation Note  Patient Details Name: Roger Gibson MRN: [id number] DOB: 05/24/1945   Cancelled Treatment:    Reason Eval/Treat Not Completed: Patient at procedure or test/ unavailable. Pt off unit for procedure this am. Will check back as able to progress with plan of care.   Norman Herrlich, MS OTR/L  Pager: [place] [facility] [place] 11/24/2016, 8:09 AM

## 2016-10-27 NOTE — Progress Notes (Signed)
[facility] Rounding Note  Subjective:     Pt seen earlier (late entry) For MRI under anesthesia ID feels needs tissue sampling (LP vs brain bx)  Objective Vital signs in last 24 hours: Vitals:   11/10/2016 0400 10/31/2016 0500 11/20/2016 0720 11/16/2016 0741  BP: (!) 174/102     Pulse:      Resp: (!) 31     Temp:    97.6 F (36.4 C)  TempSrc:    Oral  SpO2: 95%  93%   Weight:  93 kg (205 lb 0.4 oz)    Height:       Weight change:   Intake/Output Summary (Last 24 hours) at 11/21/2016 1007 Last data filed at 10/31/2016 0748  Gross per 24 hour  Intake          2339.58 ml  Output             2100 ml  Net           239.58 ml   Physical Exam: Cheyne stokes breathing pattern Poorly responsive VS as noted  Regular rhythm S1S2 No S3 Tachy 90's Abd soft.  No obvious allograft tenderness/transplant left pelvis firm and not tender No LE edema   Recent Labs Lab 10/25/16 0732 10/26/16 0144 11/23/2016 0516  NA 152* 155* 155*  K 3.8 3.8 3.4*  CL 115* 120* 121*  CO2 24 26 24   GLUCOSE 124* 221* 198*  BUN 30* 34* 30*  CREATININE 1.64* 1.71* 1.34*  CALCIUM 10.0 10.1 10.0  PHOS 1.5* 2.2* 2.5    Recent Labs Lab 10/25/16 0732 10/26/16 0144 11/13/2016 0516  AST 145* 113* 66*  ALT 60 62 52  ALKPHOS 69 56 62  BILITOT 0.9 0.8 0.9  PROT 7.3 6.9 6.1*  ALBUMIN 2.9* 2.8* 2.5*     Recent Labs Lab 10/23/16 0231 10/24/16 0728 10/25/16 0732 10/26/16 0144 10/26/2016 0516  WBC 8.1 10.4 10.5 8.4 5.5  NEUTROABS 7.1  --  9.5* 7.2 4.8  HGB 18.1* 12.1* 14.6 14.2 13.4  HCT 55.5* 36.6* 43.6 43.9 42.4  MCV 89.5 87.6 87.6 89.2 88.9  PLT 91* 121* 107* 143* 124*     Recent Labs Lab 10/09/2016 1919 10/12/2016 2325 10/22/16 0638 10/23/16 1022  CKTOTAL 9,711*  --  12,724* 6,006*  CKMB  --   --   --  6.6*  TROPONINI 0.13* 0.17* 0.22* 0.12*   CBG:  Recent Labs Lab 10/26/16 1631 10/26/16 1942 10/26/16 2311 11/09/2016 0341 10/28/2016 0743  GLUCAP 167* 203* 213* 187* 176*   Studies/Results: Mr  Brain Wo Contrast  Result Date: 10/26/2016 CLINICAL DATA:  Left-sided weakness EXAM: MRI HEAD WITHOUT CONTRAST TECHNIQUE: Multiplanar, multiecho pulse sequences of the brain and surrounding structures were obtained without intravenous contrast. COMPARISON:  Brain MRI 10/16/2016 and head CT 10/22/2016 FINDINGS: The study is degraded by motion, despite efforts to reduce this artifact, including utilization of motion-resistant MR sequences. The findings of the study are interpreted in the context of reduced sensitivity/specificity. Brain: The midline structures are normal. There is expansion of the right superior frontal gyrus with associated hyperintense T2 weighted signal. No corresponding diffusion restriction. Brain parenchymal signal is otherwise normal. Moderate diffuse atrophy. The dura is normal and there is no extra-axial collection. Vascular: Limited visualization of flow voids is normal. Skull and upper cervical spine: The visualized skull base, calvarium, upper cervical spine and extracranial soft tissues are normal. Sinuses/Orbits: No fluid levels or advanced mucosal thickening. No mastoid or middle ear effusion. Normal orbits. IMPRESSION:  1. Markedly motion degraded examination. 2. Expanded, T2 hyperintense right superior frontal gyrus. The appearance is most suggestive of a neoplastic lesion. However, characterization is again poor due to patient motion. A follow-up examination with more adequate sedation (or anesthesia) and the administration of intravenous contrast material (if not contraindicated) would likely provide better characterization. 3. No diffusion restriction or other evidence of acute ischemia. Electronically Signed   By: Ulyses Jarred M.D.   On: 10/26/2016 16:12   Dg Chest Port 1 View  Result Date: 10/26/2016 CLINICAL DATA:  Shortness of Breath EXAM: PORTABLE CHEST 1 VIEW COMPARISON:  October 24, 2016 FINDINGS: There is atelectatic change in the left base. There is no appreciable  edema or consolidation. There is cardiomegaly with pulmonary vascularity within normal limits. There is aortic atherosclerosis. There is a left subclavian vein stent. No bone lesions. No evident adenopathy. IMPRESSION: Left base atelectasis. No edema or consolidation. Stable cardiac prominence. There is aortic atherosclerosis. Aortic Atherosclerosis (ICD10-I70.0). Electronically Signed   By: Lowella Grip III M.D.   On: 10/26/2016 07:38   Dg Abd Portable 2v  Result Date: 10/26/2016 CLINICAL DATA:  Abdominal distention and decreased bowel sounds. EXAM: PORTABLE ABDOMEN - 2 VIEW COMPARISON:  10/03/2016 FINDINGS: Scattered gas and stool in the colon. No small or large bowel distention. No radiopaque stones. Visualized bones appear intact. Surgical clips in the left upper quadrant and pelvis. Thoracolumbar scoliosis convex towards the right. Vascular calcifications. Vascular stent noted in the upper mediastinum. IMPRESSION: Nonobstructive bowel gas pattern. Electronically Signed   By: Lucienne Capers M.D.   On: 10/26/2016 01:44   Medications: Infusions: . [MAR Hold] ampicillin-sulbactam (UNASYN) IV Stopped (11/10/2016 0511)  . dextrose 100 mL/hr at 11/07/2016 0640  . [MAR Hold] lacosamide (VIMPAT) IV Stopped (10/26/16 2214)  . [MAR Hold] levETIRAcetam Stopped (10/26/16 2133)  . [MAR Hold] mycophenolate (CELLCEPT) IV 250 mg (11/16/2016 0554)  . [MAR Hold] valproate sodium Stopped (11/22/2016 9629)    Scheduled Medications: . [MAR Hold] cholecalciferol  2,000 Units Oral Daily  . [MAR Hold] dexamethasone  4 mg Intravenous Q6H  . [MAR Hold] hydrALAZINE  10 mg Intravenous Q6H  . [MAR Hold] Influenza vac split quadrivalent PF  0.5 mL Intramuscular Tomorrow-1000  . [MAR Hold] insulin aspart  0-9 Units Subcutaneous Q4H  . [MAR Hold] ipratropium  0.5 mg Nebulization BID  . [MAR Hold] levalbuterol  0.63 mg Nebulization BID  . [MAR Hold] pravastatin  20 mg Oral q1800  . [MAR Hold] sodium chloride flush  3 mL  Intravenous Q12H  . [MAR Hold] tacrolimus  3 mg Oral BID   Or  . [MAR Hold] tacrolimus  3 mg Sublingual BID  . 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 Baseline creatinine between 1.5 and 1.7. Pred/prograf/myfortic. Other PMH hypertension, chronic combined systolic/diastolic CHF (13-24%), history of right kidney cancer. Presented to ED with left-sided weakness, admitted with sz- brain imaging possible/probable frontal lobe brain mass  1. Renal xpl #2 - BL creatinine 1.5-1.7. AKI on CKD (creatinine 2.29 on admission) on setting of hospitalization for possibly stroke/seizure/possible brain tumor with decreased mental status. Creatinine back to baseline. Pharmacy assisting  to get his antirejection medications changed to alternate form. Creatinine at BL. TAC trough ^ 10.3. Reduced prograf dosing to 3 mg BID 10/2  and  plan to recheck level in a few days. Decadron for brain edema so prednisone on hold 2. Metabolic acidosis - resolved 3. Hypokalemia - needs repletion. K runs X3 4. Hypernatremia - D5W ^ rate to 100 yesterday no improvement. ^ 125/hour. 5. Frontal lobe lesion/left sided weakness/lethargy - worrisome for brain tumor vs infection. MRI today under anesthesia. On anti seizure meds. Steroids for vasogenic edema. CT chest/abd and pelvis neg for mets. Neuro and ID involved. Was started on empiric Zosyn->changed to Unasyn. BK, CMV, EBV  PCRs neg. ID recommending tissue sampling (see notes) 6. Systolic and diastolic HF - + fluid balance 2/2 bicarb drip. (drip stopped, lasix not resumed) 7. HTN -  BP remains elevated. Has IV hydralazine ordered, mildly tachycardic. Uses labetolol at home. Add standing IV labetolol rather than prn.   Jamal Maes, MD [facility] [phone] Pager 11/01/2016, 10:07 AM

## 2016-10-27 NOTE — Progress Notes (Signed)
Neurology Progress Note   S:// Patient seen and examined. No acute overnight events.   O:// Current vital signs: BP (!) 153/81 (BP Location: Left Leg)   Pulse 92   Temp 97.9 F (36.6 C)   Resp (!) 32   Ht 5\' 10"  (1.778 m)   Wt 93 kg (205 lb 0.4 oz)   SpO2 97%   BMI 29.42 kg/m  Vital signs in last 24 hours: Temp:  [97.6 F (36.4 C)-98.8 F (37.1 C)] 97.9 F (36.6 C) (10/03 1245) Pulse Rate:  [86-105] 92 (10/03 1245) Resp:  [15-52] 32 (10/03 1245) BP: (153-191)/(81-127) 153/81 (10/03 1245) SpO2:  [93 %-98 %] 97 % (10/03 1245) Weight:  [93 kg (205 lb 0.4 oz)] 93 kg (205 lb 0.4 oz) (10/03 0500) Neurological exam Mental status awake, alert, follows some simple commands. Speech and language: Clear without dysarthria, some aphasia, able to tell me his name but perseverates. Cranial nerves: Pupils equal round reactive, extra ocular movements intact, visual fields full, face symmetric, artery daily intact. Motor exam: Right upper extremity antigravity, left upper extremity barely antigravity unsure if it's inattention or weakness. Did not cooperate with testing of lower 70s. Sensory: He grimaces equally to noxious stim in all 4. Seems that there might be a component of inattention or neglect of the left side. Unable to test coordination or gait.  Medications  Current Facility-Administered Medications:  .  acetaminophen (TYLENOL) tablet 650 mg, 650 mg, Oral, Q6H PRN **OR** acetaminophen (TYLENOL) suppository 650 mg, 650 mg, Rectal, Q6H PRN, Opyd, Ilene Qua, MD, 650 mg at 10/22/16 1506 .  Ampicillin-Sulbactam (UNASYN) 3 g in sodium chloride 0.9 % 100 mL IVPB, 3 g, Intravenous, Q6H, Tommy Medal, Lavell Islam, MD, Stopped at 11/02/2016 1448 .  bisacodyl (DULCOLAX) suppository 10 mg, 10 mg, Rectal, Daily PRN, Opyd, Ilene Qua, MD, 10 mg at 10/26/16 0511 .  camphor-menthol (SARNA) lotion, , Topical, PRN, Jani Gravel, MD, 1 application at 76/16/07 0055 .  cholecalciferol (VITAMIN D) tablet 2,000  Units, 2,000 Units, Oral, Daily, Opyd, Ilene Qua, MD, 2,000 Units at 10/24/16 0904 .  dexamethasone (DECADRON) injection 4 mg, 4 mg, Intravenous, Q6H, Greta Doom, MD, 4 mg at 10/26/2016 1356 .  dextrose 5 % solution, , Intravenous, Continuous, Jamal Maes, MD, Last Rate: 125 mL/hr at 11/11/2016 1316, 125 mL at 10/31/2016 1316 .  diphenhydrAMINE (BENADRYL) injection 25 mg, 25 mg, Intravenous, Q6H PRN, Jani Gravel, MD, 25 mg at 10/25/16 1615 .  hydrALAZINE (APRESOLINE) injection 10 mg, 10 mg, Intravenous, Q6H, Corliss Parish, MD, 10 mg at 11/08/2016 1218 .  Influenza vac split quadrivalent PF (FLUZONE HIGH-DOSE) injection 0.5 mL, 0.5 mL, Intramuscular, Tomorrow-1000, Sheikh, Omair Latif, DO .  insulin aspart (novoLOG) injection 0-9 Units, 0-9 Units, Subcutaneous, Q4H, Raiford Noble [place], [place], 2 Units at 11/16/2016 1448 .  ipratropium (ATROVENT) nebulizer solution 0.5 mg, 0.5 mg, Nebulization, BID, Sheikh, Omair Latif, DO .  labetalol (NORMODYNE,TRANDATE) injection 10 mg, 10 mg, Intravenous, Q2H PRN, Raiford Noble Latif, DO, 10 mg at 11/21/2016 1146 .  lacosamide (VIMPAT) 200 mg in sodium chloride 0.9 % 25 mL IVPB, 200 mg, Intravenous, Q12H, Greta Doom, MD, Stopped at 11/13/2016 1419 .  levalbuterol (XOPENEX) nebulizer solution 0.63 mg, 0.63 mg, Nebulization, BID, Sheikh, Omair Latif, DO .  levETIRAcetam (KEPPRA) 750 mg in sodium chloride 0.9 % 100 mL IVPB, 750 mg, Intravenous, Q12H, Greta Doom, MD, Stopped at 11/18/2016 1402 .  morphine 4 MG/ML injection 1-3 mg, 1-3 mg, Intravenous, Q4H PRN,  Vianne Bulls, MD, 2 mg at 10/24/16 5361 .  mycophenolate (CELLCEPT) 250 mg in dextrose 5 % 42.5 mL IVPB, 250 mg, Intravenous, Q12H, Lyndee Leo, RPH, Stopped at 11/08/2016 0754 .  ondansetron (ZOFRAN) tablet 4 mg, 4 mg, Oral, Q6H PRN **OR** ondansetron (ZOFRAN) injection 4 mg, 4 mg, Intravenous, Q6H PRN, Opyd, Timothy S, MD .  potassium chloride 10 mEq in 100 mL IVPB, 10 mEq,  Intravenous, Q1 Hr x 3, Jamal Maes, MD, Last Rate: 100 mL/hr at 11/08/2016 1445, 10 mEq at 11/11/2016 1445 .  pravastatin (PRAVACHOL) tablet 20 mg, 20 mg, Oral, q1800, Opyd, Ilene Qua, MD, 20 mg at 10/24/16 1733 .  senna-docusate (Senokot-S) tablet 1 tablet, 1 tablet, Oral, QHS PRN, Opyd, Timothy S, MD .  sodium chloride flush (NS) 0.9 % injection 3 mL, 3 mL, Intravenous, Q12H, Opyd, Timothy S, MD, 3 mL at 10/26/16 1000 .  tacrolimus (PROGRAF) capsule 3 mg, 3 mg, Oral, BID **OR** tacrolimus (PROGRAF) capsule 3 mg, 3 mg, Sublingual, BID, Jamal Maes, MD, 3 mg at 11/05/2016 1343 .  tamsulosin (FLOMAX) capsule 0.4 mg, 0.4 mg, Oral, QPC supper, Opyd, Timothy S, MD, 0.4 mg at 10/24/16 1733 .  valproate (DEPACON) 500 mg in dextrose 5 % 50 mL IVPB, 500 mg, Intravenous, Q8H, Greta Doom, MD, Last Rate: 55 mL/hr at 11/09/2016 1445, 500 mg at 11/21/2016 1445 Labs CBC    Component Value Date/Time   WBC 5.5 11/08/2016 0516   RBC 4.77 11/17/2016 0516   HGB 13.4 11/24/2016 0516   HGB 9.5 (L) 10/09/2012 1447   HCT 42.4 11/16/2016 0516   HCT 29.1 (L) 10/09/2012 1447   PLT 124 (L) 11/01/2016 0516   PLT 290 10/09/2012 1447   MCV 88.9 11/23/2016 0516   MCV 91 10/09/2012 1447   MCH 28.1 11/09/2016 0516   MCHC 31.6 11/11/2016 0516   RDW 14.7 11/04/2016 0516   RDW 17.9 (H) 10/09/2012 1447   LYMPHSABS 0.3 (L) 11/08/2016 0516   LYMPHSABS 0.4 (L) 09/22/2012 0551   MONOABS 0.4 11/01/2016 0516   MONOABS 0.2 09/22/2012 0551   EOSABS 0.0 11/23/2016 0516   EOSABS 0.0 09/22/2012 0551   BASOSABS 0.0 10/26/2016 0516   BASOSABS 0.0 09/22/2012 0551   CMP     Component Value Date/Time   NA 155 (H) 11/15/2016 0516   NA 134 (L) 10/09/2012 1447   K 3.4 (L) 11/04/2016 0516   K 5.0 10/11/2012 1044   CL 121 (H) 11/13/2016 0516   CL 94 (L) 10/09/2012 1447   CO2 24 11/15/2016 0516   CO2 33 (H) 10/09/2012 1447   GLUCOSE 198 (H) 11/17/2016 0516   GLUCOSE 78 10/09/2012 1447   BUN 30 (H) 11/07/2016 0516    BUN 33 (H) 10/09/2012 1447   CREATININE 1.34 (H) 11/07/2016 0516   CREATININE 9.78 (H) 10/09/2012 1447   CALCIUM 10.0 11/17/2016 0516   CALCIUM 11.0 (H) 10/09/2012 1447   PROT 6.1 (L) 11/12/2016 0516   ALBUMIN 2.5 (L) 10/26/2016 0516   AST 66 (H) 11/24/2016 0516   ALT 52 11/21/2016 0516   ALKPHOS 62 11/07/2016 0516   BILITOT 0.9 11/01/2016 0516   GFRNONAA 51 (L) 11/16/2016 0516   GFRNONAA 5 (L) 10/09/2012 1447   GFRAA 59 (L) 11/20/2016 0516   GFRAA 6 (L) 10/09/2012 1447   Lipid Panel     Component Value Date/Time   CHOL 255 (H) 03/25/2008 2341   TRIG 169 (H) 03/25/2008 2341   HDL 52 03/25/2008 2341  CHOLHDL 4.9 Ratio 03/25/2008 2341   VLDL 34 03/25/2008 2341   LDLCALC 169 (H) 03/25/2008 2341   LDLDIRECT 116.4 12/25/2007 0952   Imaging I have reviewed images in epic and the results pertinent to this consultation are: MRI of the brain was done under anesthesia that shows a stable right frontal mass. Unable to get gadolinium because of the deranged renal function and end-stage renal disease.  Assessment:  71 year old man with a past medical history of chronic a minor heart failure, hypertension, history of right kidney cancer status post renal transplant, end-stage renal disease on dialysis presented to the ER for evaluation of left-sided weakness.  Neuro imaging showed a right frontal mass area and unable to characterize it further because of inability to use contrast given his end-stage renal disease. Infectious disease service and neurosurgery service on board. I agree with recommendations from ID to pursue tissue diagnosis given his immunecompromised status and potential for acquiring atypical and opportunistic infection and unclear diagnosis at this point.   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 I agree with ID and I don't think spinal tap will be of much yield and we should proceed with tissue diagnosis via a right frontal biopsy. I discussed this in detail with the patient's wife at bedside as well as primary hospitalist Dr. Wynelle Cleveland.  We'll be available as needed. Please call us with questions.  -- Amie Portland, MD [facility] [phone]  If 7pm to 7am, please call on call as listed on AMION.

## 2016-10-27 NOTE — Progress Notes (Signed)
SLP Cancellation Note  Patient Details Name: Roger Gibson MRN: [id number] DOB: 1945/08/19   Cancelled treatment:        Pt in MRI earlier today. Will continue efforts.    [name] 11/15/2016, 2:52 PM   [name].Ed [employer] [phone]

## 2016-10-27 NOTE — Progress Notes (Signed)
Subjective: Sedated post MRI   Antibiotics:  Anti-infectives    Start     Dose/Rate Route Frequency Ordered Stop   10/25/16 2200  Ampicillin-Sulbactam (UNASYN) 3 g in sodium chloride 0.9 % 100 mL IVPB     3 g 200 mL/hr over 30 Minutes Intravenous Every 6 hours 10/25/16 1501     10/23/16 0800  piperacillin-tazobactam (ZOSYN) IVPB 3.375 g  Status:  Discontinued     3.375 g 12.5 mL/hr over 240 Minutes Intravenous Every 8 hours 10/23/16 0735 10/25/16 1254      Medications: Scheduled Meds: . cholecalciferol  2,000 Units Oral Daily  . dexamethasone  4 mg Intravenous Q6H  . hydrALAZINE  10 mg Intravenous Q6H  . Influenza vac split quadrivalent PF  0.5 mL Intramuscular Tomorrow-1000  . insulin aspart  0-9 Units Subcutaneous Q4H  . ipratropium  0.5 mg Nebulization BID  . levalbuterol  0.63 mg Nebulization BID  . pravastatin  20 mg Oral q1800  . sodium chloride flush  3 mL Intravenous Q12H  . tacrolimus  3 mg Oral BID   Or  . tacrolimus  3 mg Sublingual BID  . tamsulosin  0.4 mg Oral QPC supper   Continuous Infusions: . ampicillin-sulbactam (UNASYN) IV Stopped (11/20/2016 1448)  . dextrose 125 mL (10/31/2016 1316)  . lacosamide (VIMPAT) IV Stopped (10/30/2016 1419)  . levETIRAcetam Stopped (10/25/2016 1402)  . mycophenolate (CELLCEPT) IV Stopped (11/01/2016 0754)  . potassium chloride 10 mEq (11/10/2016 1445)  . valproate sodium 500 mg (11/10/2016 1445)   PRN Meds:.acetaminophen **OR** acetaminophen, bisacodyl, camphor-menthol, diphenhydrAMINE, labetalol, morphine injection, ondansetron **OR** ondansetron (ZOFRAN) IV, senna-docusate    Objective: Weight change:   Intake/Output Summary (Last 24 hours) at 10/26/2016 1547 Last data filed at 11/02/2016 1245  Gross per 24 hour  Intake          2229.58 ml  Output             2375 ml  Net          -145.42 ml   Blood pressure (!) 153/81, pulse 92, temperature 97.9 F (36.6 C), resp. rate (!) 32, height 5\' 10"  (1.778 m), weight 205  lb 0.4 oz (93 kg), SpO2 97 %. Temp:  [97.6 F (36.4 C)-98.8 F (37.1 C)] 97.9 F (36.6 C) (10/03 1245) Pulse Rate:  [86-105] 92 (10/03 1245) Resp:  [15-52] 32 (10/03 1245) BP: (153-191)/(81-127) 153/81 (10/03 1245) SpO2:  [93 %-98 %] 97 % (10/03 1245) Weight:  [205 lb 0.4 oz (93 kg)] 205 lb 0.4 oz (93 kg) (10/03 0500)  Physical Exam: General: somnolent, CVS regular rate, normal r,   Chest:  no wheezing resp distress Abdomen:nondistended,  Extremities: edema Neuro: is able to move all extremities wearing mits CBC:  CBC Latest Ref Rng & Units 11/11/2016 10/26/2016 10/25/2016  WBC 4.0 - 10.5 K/uL 5.5 8.4 10.5  Hemoglobin 13.0 - 17.0 g/dL 13.4 14.2 14.6  Hematocrit 39.0 - 52.0 % 42.4 43.9 43.6  Platelets 150 - 400 K/uL 124(L) 143(L) 107(L)      BMET  Recent Labs  10/26/16 0144 11/24/2016 0516  NA 155* 155*  K 3.8 3.4*  CL 120* 121*  CO2 26 24  GLUCOSE 221* 198*  BUN 34* 30*  CREATININE 1.71* 1.34*  CALCIUM 10.1 10.0     Liver Panel   Recent Labs  10/26/16 0144 11/15/2016 0516  PROT 6.9 6.1*  ALBUMIN 2.8* 2.5*  AST 113* 66*  ALT 62 52  ALKPHOS 56 62  BILITOT 0.8 0.9       Sedimentation Rate No results for input(s): ESRSEDRATE in the last 72 hours. C-Reactive Protein No results for input(s): CRP in the last 72 hours.  Micro Results: Recent Results (from the past 720 hour(s))  MRSA PCR Screening     Status: None   Collection Time: 10/23/16  1:58 AM  Result Value Ref Range Status   MRSA by PCR NEGATIVE NEGATIVE Final    Comment:        The GeneXpert MRSA Assay (FDA approved for NASAL specimens only), is one component of a comprehensive MRSA colonization surveillance program. It is not intended to diagnose MRSA infection nor to guide or monitor treatment for MRSA infections.   Culture, Urine     Status: Abnormal   Collection Time: 10/23/16 11:12 AM  Result Value Ref Range Status   Specimen Description URINE, RANDOM  Final   Special Requests  NONE  Final   Culture MULTIPLE SPECIES PRESENT, SUGGEST RECOLLECTION (A)  Final   Report Status 10/24/2016 FINAL  Final  Culture, blood (Routine X 2) w Reflex to ID Panel     Status: None (Preliminary result)   Collection Time: 10/25/16 12:04 PM  Result Value Ref Range Status   Specimen Description BLOOD LEFT HAND  Final   Special Requests IN PEDIATRIC BOTTLE Blood Culture adequate volume  Final   Culture NO GROWTH 2 DAYS  Final   Report Status PENDING  Incomplete  Culture, blood (Routine X 2) w Reflex to ID Panel     Status: None (Preliminary result)   Collection Time: 10/25/16 12:04 PM  Result Value Ref Range Status   Specimen Description BLOOD LEFT HAND  Final   Special Requests IN PEDIATRIC BOTTLE Blood Culture adequate volume  Final   Culture NO GROWTH 2 DAYS  Final   Report Status PENDING  Incomplete    Studies/Results: Mr Brain Wo Contrast  Result Date: 11/20/2016 CLINICAL DATA:  Altered level of consciousness. EXAM: MRI HEAD WITHOUT CONTRAST TECHNIQUE: Multiplanar, multiecho pulse sequences of the brain and surrounding structures were obtained without intravenous contrast. COMPARISON:  Motion degraded MR brain 10/26/2016. CT head 10/22/2016. MR brain 10/05/2016 FINDINGS: Brain: Redemonstrated is abnormal enlarged RIGHT frontal gyrus, T2 and FLAIR hyperintense, facilitated diffusion, without hemorrhage. No other similar lesions elsewhere. Global atrophy. Vascular: Normal flow voids. Skull and upper cervical spine: Normal marrow signal. Sinuses/Orbits: Negative. Other: None. 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 Contrast not administered because of the patient's dialysis status. Electronically Signed   By: Staci Righter M.D.   On: 10/30/2016 11:27   Mr Brain Wo Contrast  Result Date: 10/26/2016 CLINICAL DATA:  Left-sided weakness EXAM: MRI HEAD WITHOUT CONTRAST TECHNIQUE: Multiplanar, multiecho pulse sequences of the brain and  surrounding structures were obtained without intravenous contrast. COMPARISON:  Brain MRI 10/17/2016 and head CT 10/22/2016 FINDINGS: The study is degraded by motion, despite efforts to reduce this artifact, including utilization of motion-resistant MR sequences. The findings of the study are interpreted in the context of reduced sensitivity/specificity. Brain: The midline structures are normal. There is expansion of the right superior frontal gyrus with associated hyperintense T2 weighted signal. No corresponding diffusion restriction. Brain parenchymal signal is otherwise normal. Moderate diffuse atrophy. The dura is normal and there is no extra-axial collection. Vascular: Limited visualization of flow voids is normal. Skull and upper cervical spine: The visualized skull base, calvarium, upper cervical spine and extracranial soft tissues are  normal. Sinuses/Orbits: No fluid levels or advanced mucosal thickening. No mastoid or middle ear effusion. Normal orbits. IMPRESSION: 1. Markedly motion degraded examination. 2. Expanded, T2 hyperintense right superior frontal gyrus. The appearance is most suggestive of a neoplastic lesion. However, characterization is again poor due to patient motion. A follow-up examination with more adequate sedation (or anesthesia) and the administration of intravenous contrast material (if not contraindicated) would likely provide better characterization. 3. No diffusion restriction or other evidence of acute ischemia. Electronically Signed   By: Ulyses Jarred M.D.   On: 10/26/2016 16:12   Dg Chest Port 1 View  Result Date: 10/26/2016 CLINICAL DATA:  Shortness of Breath EXAM: PORTABLE CHEST 1 VIEW COMPARISON:  October 24, 2016 FINDINGS: There is atelectatic change in the left base. There is no appreciable edema or consolidation. There is cardiomegaly with pulmonary vascularity within normal limits. There is aortic atherosclerosis. There is a left subclavian vein stent. No bone  lesions. No evident adenopathy. IMPRESSION: Left base atelectasis. No edema or consolidation. Stable cardiac prominence. There is aortic atherosclerosis. Aortic Atherosclerosis (ICD10-I70.0). Electronically Signed   By: Lowella Grip III M.D.   On: 10/26/2016 07:38   Dg Abd Portable 2v  Result Date: 10/26/2016 CLINICAL DATA:  Abdominal distention and decreased bowel sounds. EXAM: PORTABLE ABDOMEN - 2 VIEW COMPARISON:  10/07/2016 FINDINGS: Scattered gas and stool in the colon. No small or large bowel distention. No radiopaque stones. Visualized bones appear intact. Surgical clips in the left upper quadrant and pelvis. Thoracolumbar scoliosis convex towards the right. Vascular calcifications. Vascular stent noted in the upper mediastinum. IMPRESSION: Nonobstructive bowel gas pattern. Electronically Signed   By: Lucienne Capers M.D.   On: 10/26/2016 01:44      Assessment/Plan:  INTERVAL HISTORY:   MRI with anesthesia: suspicious for NEOPLASM   Principal Problem:   Acute focal neurological deficit Active Problems:   Hypokalemia   History of renal transplant   Immunosuppressed status (HCC)   AKI (acute kidney injury) ([place])   CKD (chronic kidney disease), stage III (HCC)   Elevated troponin   Vasogenic brain edema (HCC)   Acute encephalopathy   Chronic combined systolic and diastolic CHF (congestive heart failure) (HCC)   Metabolic acidosis   Acute respiratory failure (HCC)   SIRS (systemic inflammatory response syndrome) (HCC)   Pneumobilia   Acute respiratory distress   Elevated AST (SGOT)   Thrombocytopenia (HCC)   Abdominal distention    Roger Gibson is a 71 y.o. 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 He had CT without contrast on admission that raised question of right  frontal lesion but imaging has been compromised by patient moving. Most recent MRI is mentioned as being suspicious for malignancy.    #1 Right frontal lobe lesion MRI is concerning for Malignancy.  I shared report with wife who was at bedside taking notes.  I still believe he  NEEDS TISSUE SAMPLING to establish a diagnosis  One could try to get an LP as an alternative route to diagnosis but I think  NOT PURSUING a biopsy is NOT appropriate  pathway in patient who is HIGHLY immuno-compromised IF AGGRESSIVE care is being pursued  For ex: AFB and FUNGAL CULTURES on LP are EXTREMELY LOW YIELD even when LARGE VOLUME CSF sent  Toxoplasma is conceivable (if he  has amnestic immune system since abs are negative  PML due to JC virus is certainly possible--and would charateristicaly only involve white matter and should NOT resemble a neoplasm  If LP pursued would certainly send  LARGE 40 ML OF CSF for   Cell count and differential Protein and glucose  10 ml of CSF for AFB stain and culture "" fungal stain and culture Crypto ag Toxoplasma PCR EBV PCR CMV PCR (CMV negative PCR and EBV + PCR do not tell us about end organ pathology esp in case of CMV)  JC virus PCR (if this is a white matter ONLY lesion)_  CYTOLOGY  I am again very skeptical that the above labs will allow for RAPID diagnosis and the more direct route is proceed to brain biopsy with tissue to path, AFB, fungal, nocardia culture  #2 Pneumobilia: continuing to cover with unasyn for now and will  Dc in the am  #3 Renal failure: his BK virus in blood is negative, and his renal function is improved  I will defer to Neurosurgery re whether they are going to proceed with tissue biopsy.   It appears MOST likely this is a cancer but certainly infection is possible  I will pull back at present.  Please call me back if either LP and or tissue comes back c/w infection and I would be happy to help guide directed, empiric  therapy      LOS: 6 days   Alcide Evener 11/04/2016, 3:47 PM

## 2016-10-27 NOTE — Progress Notes (Signed)
Telephone consent received per Alroy Bailiff for patient to go for MRI under anesthesia.  Verified per this RN and Othella Boyer, RN.  Transport available at bedside and patient to be transferred to short stay area; report given to [place].

## 2016-10-27 NOTE — Anesthesia Postprocedure Evaluation (Signed)
Anesthesia Post Note  Patient: GANON DEMASI  Procedure(s) Performed: MRI of the Brain (N/A )     Patient location during evaluation: PACU Anesthesia Type: General Level of consciousness: awake and alert Pain management: pain level controlled Vital Signs Assessment: post-procedure vital signs reviewed and stable Respiratory status: spontaneous breathing, nonlabored ventilation, respiratory function stable and patient connected to nasal cannula oxygen Cardiovascular status: blood pressure returned to baseline and stable Postop Assessment: no apparent nausea or vomiting Anesthetic complications: no    Last Vitals:  Vitals:   11/13/2016 1218 11/08/2016 1245  BP: (!) 172/101 (!) 153/81  Pulse:  92  Resp:  (!) 32  Temp:  36.6 C  SpO2:  97%    Last Pain:  Vitals:   11/05/2016 0741  TempSrc: Oral  PainSc:                  Audry Pili

## 2016-10-27 NOTE — Transfer of Care (Signed)
Immediate Anesthesia Transfer of Care Note  Patient: Roger Gibson  Procedure(s) Performed: MRI of the Brain (N/A )  Patient Location: PACU  Anesthesia Type:General  Level of Consciousness: awake, oriented and patient cooperative  Airway & Oxygen Therapy: Patient Spontanous Breathing and Patient connected to nasal cannula oxygen  Post-op Assessment: Report given to RN, Post -op Vital signs reviewed and stable and Patient moving all extremities  Post vital signs: Reviewed and stable  Last Vitals:  Vitals:   11/10/2016 0720 11/23/2016 0741  BP:    Pulse:    Resp:    Temp:  36.4 C  SpO2: 93%     Last Pain:  Vitals:   11/08/2016 0741  TempSrc: Oral  PainSc:          Complications: No apparent anesthesia complications

## 2016-10-27 NOTE — Progress Notes (Signed)
PROGRESS NOTE    Roger Gibson   PTW:[id number]  DOB: 1945/05/27  DOA: 09/29/2016 PCP: [name], MD   Brief Narrative:  Roger Gibson y.o.malewith medical history significant for hypertension, chronic combined systolic/diastolic CHF, history of right renal cancer s/p transplant who presented to the ER for left arm weakness. Patient was found by his wife "hanging off the bed" at 1300 today. She tried to get him up and he slid to the floor Noted to have dried blood on tongue in ER.   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 CT head showed right frontal abnormality. MRI were attempted but he was not cooperative enough to obtain good images but MRI was suggestive of a frontal mass with vasogenic edema.  9/28- decompensated and developed acute resp failure and was transferred to SDU and started on BiPAP   Subjective: Poor communication- states "I'm all right" when asked questions  Assessment & Plan:   Principal Problem:  Right Frontal lobe lesion - with left arm weakness and mild confusion - Neuro, ID and NS following - ID is following and performing an nfectious work up  - MRI performed under anaesthesia today confirms frontal lesion to be a mass - I have spoken with Dr Malen Gauze and messaged Neurosurgery regarding findings and updated patient and wife - Neurosurgery to discuss biopsy with wife -cont anti-epileptics per neurology - Overnight EEG and LTM EEG - "showed evidence of a structural abnormality in the right hemisphere, with epileptogenic potential and suggestion of acute-subacute injury. There is periodic pattern of sharp waves ("PLEDs"). This is indicative of a high risk of seizure occurrence, but no distinct seizures are seen."   Active Problems:     History of renal transplant     Immunosuppressed status   AKI on CKD 3  Hypernatremia - Cr has steadily improved - cont D5W per nephrology for hypernatremia -  renal ultrasound unrevealing  - on Tacrolimus, Mycophenolate, prednisone (currently on Decadron), Prograf  Hypokalemia - cont to replace via IV     Pneumobilia - noted on CT imaging- on Unasyn per ID    Acute respiratory distress - temporarily needed BiPAP- cause uncertain- CT chest without contrast showed only emphysematous changes - currently on BID nebs and steroids for vasogenic edema - cont to follow in SDU-   HTN - Labetalol, Hydralazine   BPH - Flomax  Hiccoughs  - apparently have been on going for days - will give a one time dose of Thorazine  Nutrition: no PO intake since admission- per nurse, he was not able to swallow anything-all meds being given through IV-  need to consider NG tube  DVT prophylaxis: SCDs Code Status: Full code Family Communication: wife Disposition Plan: cont to follow in SDU Consultants:   Neuro  Neurosurgery  ID Procedures:  EEG   2 D ECHO - Left ventricle: The cavity size was normal. Wall thickness was increased in a pattern of moderate LVH. Systolic function was normal. The estimated ejection fraction was in the range of 55% to 60%. Wall motion was normal; there were no regional wall motion abnormalities. Doppler parameters are consistent with abnormal left ventricular relaxation (grade 1 diastolic dysfunction). - Aortic valve: Mildly calcified annulus. Trileaflet; normal thickness leaflets. - Mitral valve: There was mild regurgitation. - Left atrium: The atrium was mildly dilated. Antimicrobials:  Anti-infectives    Start     Dose/Rate Route Frequency Ordered Stop   10/25/16 2200  Ampicillin-Sulbactam (UNASYN) 3 g in sodium chloride 0.9 % 100 mL IVPB     3 g 200 mL/hr over 30 Minutes Intravenous Every 6 hours 10/25/16 1501 10/30/2016 0959   10/23/16 0800  piperacillin-tazobactam (ZOSYN) IVPB 3.375 g  Status:  Discontinued     3.375 g 12.5 mL/hr over 240 Minutes Intravenous Every 8 hours 10/23/16 0735 10/25/16  1254       Objective: Vitals:   10/31/2016 1200 11/04/2016 1215 11/15/2016 1218 11/08/2016 1245  BP: (!) 157/89 (!) 172/101 (!) 172/101 (!) 153/81  Pulse: 89 86  92  Resp: (!) 23 (!) 28  (!) 32  Temp:    97.9 F (36.6 C)  TempSrc:      SpO2: 96% 96%  97%  Weight:      Height:        Intake/Output Summary (Last 24 hours) at 11/17/2016 1557 Last data filed at 11/03/2016 1245  Gross per 24 hour  Intake          2229.58 ml  Output             2375 ml  Net          -145.42 ml   Filed Weights   10/23/16 0500 10/24/16 0335 11/21/2016 0500  Weight: 90 kg (198 lb 6.6 oz) 93 kg (205 lb 0.4 oz) 93 kg (205 lb 0.4 oz)    Examination: General exam: Appears rest less  HEENT: PERRLA, oral mucosa moist, no sclera icterus or thrush Respiratory system: Clear to auscultation.   Cardiovascular system: S1 & S2 heard, RRR.  No murmurs  Gastrointestinal system: Abdomen soft, non-tender, nondistended. Normal bowel sound. No organomegaly Central nervous system: Alert  - left arm weakness noted- does not follow commands very well Extremities: No cyanosis, clubbing or edema Skin: No rashes or ulcers    Data Reviewed: I have personally reviewed following labs and imaging studies  CBC:  Recent Labs Lab 09/26/2016 1345  10/23/16 0231 10/24/16 0728 10/25/16 0732 10/26/16 0144 10/31/2016 0516  WBC 7.0  < > 8.1 10.4 10.5 8.4 5.5  NEUTROABS 5.3  --  7.1  --  9.5* 7.2 4.8  HGB 12.9*  < > 18.1* 12.1* 14.6 14.2 13.4  HCT 39.1  < > 55.5* 36.6* 43.6 43.9 42.4  MCV 87.9  < > 89.5 87.6 87.6 89.2 88.9  PLT 158  < > 91* 121* 107* 143* 124*  < > = values in this interval not displayed. Basic Metabolic Panel:  Recent Labs Lab 10/23/16 0231  10/24/16 0234 10/24/16 0728 10/25/16 0732 10/26/16 0144 11/15/2016 0516  NA 143  < > 147* 147* 152* 155* 155*  K 4.6  < > 4.1 3.8 3.8 3.8 3.4*  CL 112*  < > 111 109 115* 120* 121*  CO2 13*  < > 26 24 24 26 24   GLUCOSE 164*  < > 288* 285* 124* 221* 198*  BUN 21*  < >  31* 27* 30* 34* 30*  CREATININE 2.10*  < > 1.90* 1.90* 1.64* 1.71* 1.34*  CALCIUM 9.4  < > 9.1 9.1 10.0 10.1 10.0  MG 2.1  --   --  2.1 2.1 2.0 1.7  PHOS 3.6  --  2.0* 1.8* 1.5* 2.2* 2.5  < > = values in this interval not displayed. GFR: Estimated Creatinine Clearance: 57.1 mL/min (A) (by C-G formula based on SCr of 1.34 mg/dL (H)). Liver Function Tests:  Recent Labs Lab 10/23/16 0231 10/24/16 0234 10/24/16 8338 10/25/16 0732 10/26/16  0144 10/26/2016 0516  AST 187*  --  110* 145* 113* 66*  ALT 58  --  52 60 62 52  ALKPHOS 70  --  55 69 56 62  BILITOT 1.8*  --  1.2 0.9 0.8 0.9  PROT 7.8  --  6.5 7.3 6.9 6.1*  ALBUMIN 3.6 2.8* 2.9* 2.9* 2.8* 2.5*   No results for input(s): LIPASE, AMYLASE in the last 168 hours. No results for input(s): AMMONIA in the last 168 hours. Coagulation Profile:  Recent Labs Lab 10/20/2016 1345  INR 1.08   Cardiac Enzymes:  Recent Labs Lab 10/08/2016 1919 10/04/2016 2325 10/22/16 0638 10/23/16 1022  CKTOTAL 9,711*  --  12,724* 6,006*  CKMB  --   --   --  6.6*  TROPONINI 0.13* 0.17* 0.22* 0.12*   BNP (last 3 results) No results for input(s): PROBNP in the last 8760 hours. HbA1C: No results for input(s): HGBA1C in the last 72 hours. CBG:  Recent Labs Lab 10/26/16 1631 10/26/16 1942 10/26/16 2311 11/01/2016 0341 11/16/2016 0743  GLUCAP 167* 203* 213* 187* 176*   Lipid Profile: No results for input(s): CHOL, HDL, LDLCALC, TRIG, CHOLHDL, LDLDIRECT in the last 72 hours. Thyroid Function Tests: No results for input(s): TSH, T4TOTAL, FREET4, T3FREE, THYROIDAB in the last 72 hours. Anemia Panel: No results for input(s): VITAMINB12, FOLATE, FERRITIN, TIBC, IRON, RETICCTPCT in the last 72 hours. Urine analysis:    Component Value Date/Time   COLORURINE YELLOW 10/23/2016 1112   APPEARANCEUR HAZY (A) 10/23/2016 1112   LABSPEC 1.015 10/23/2016 1112   PHURINE 5.0 10/23/2016 1112   GLUCOSEU NEGATIVE 10/23/2016 1112   HGBUR MODERATE (A) 10/23/2016  1112   HGBUR negative 04/01/2008 0933   BILIRUBINUR NEGATIVE 10/23/2016 1112   KETONESUR 20 (A) 10/23/2016 1112   PROTEINUR 30 (A) 10/23/2016 1112   UROBILINOGEN 0.2 05/14/2014 2010   NITRITE NEGATIVE 10/23/2016 1112   LEUKOCYTESUR NEGATIVE 10/23/2016 1112   Sepsis Labs: @LABRCNTIP (procalcitonin:4,lacticidven:4) ) Recent Results (from the past 240 hour(s))  MRSA PCR Screening     Status: None   Collection Time: 10/23/16  1:58 AM  Result Value Ref Range Status   MRSA by PCR NEGATIVE NEGATIVE Final    Comment:        The GeneXpert MRSA Assay (FDA approved for NASAL specimens only), is one component of a comprehensive MRSA colonization surveillance program. It is not intended to diagnose MRSA infection nor to guide or monitor treatment for MRSA infections.   Culture, Urine     Status: Abnormal   Collection Time: 10/23/16 11:12 AM  Result Value Ref Range Status   Specimen Description URINE, RANDOM  Final   Special Requests NONE  Final   Culture MULTIPLE SPECIES PRESENT, SUGGEST RECOLLECTION (A)  Final   Report Status 10/24/2016 FINAL  Final  Culture, blood (Routine X 2) w Reflex to ID Panel     Status: None (Preliminary result)   Collection Time: 10/25/16 12:04 PM  Result Value Ref Range Status   Specimen Description BLOOD LEFT HAND  Final   Special Requests IN PEDIATRIC BOTTLE Blood Culture adequate volume  Final   Culture NO GROWTH 2 DAYS  Final   Report Status PENDING  Incomplete  Culture, blood (Routine X 2) w Reflex to ID Panel     Status: None (Preliminary result)   Collection Time: 10/25/16 12:04 PM  Result Value Ref Range Status   Specimen Description BLOOD LEFT HAND  Final   Special Requests IN PEDIATRIC BOTTLE Blood Culture  adequate volume  Final   Culture NO GROWTH 2 DAYS  Final   Report Status PENDING  Incomplete         Radiology Studies: Mr Brain 50 Contrast  Result Date: 11/09/2016 CLINICAL DATA:  Altered level of consciousness. EXAM: MRI HEAD  WITHOUT CONTRAST TECHNIQUE: Multiplanar, multiecho pulse sequences of the brain and surrounding structures were obtained without intravenous contrast. COMPARISON:  Motion degraded MR brain 10/26/2016. CT head 10/22/2016. MR brain 10/24/2016 FINDINGS: Brain: Redemonstrated is abnormal enlarged RIGHT frontal gyrus, T2 and FLAIR hyperintense, facilitated diffusion, without hemorrhage. No other similar lesions elsewhere. Global atrophy. Vascular: Normal flow voids. Skull and upper cervical spine: Normal marrow signal. Sinuses/Orbits: Negative. Other: None. 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 Contrast not administered because of the patient's dialysis status. Electronically Signed   By: Staci Righter M.D.   On: 11/03/2016 11:27   Mr Brain Wo Contrast  Result Date: 10/26/2016 CLINICAL DATA:  Left-sided weakness EXAM: MRI HEAD WITHOUT CONTRAST TECHNIQUE: Multiplanar, multiecho pulse sequences of the brain and surrounding structures were obtained without intravenous contrast. COMPARISON:  Brain MRI 10/15/2016 and head CT 10/22/2016 FINDINGS: The study is degraded by motion, despite efforts to reduce this artifact, including utilization of motion-resistant MR sequences. The findings of the study are interpreted in the context of reduced sensitivity/specificity. Brain: The midline structures are normal. There is expansion of the right superior frontal gyrus with associated hyperintense T2 weighted signal. No corresponding diffusion restriction. Brain parenchymal signal is otherwise normal. Moderate diffuse atrophy. The dura is normal and there is no extra-axial collection. Vascular: Limited visualization of flow voids is normal. Skull and upper cervical spine: The visualized skull base, calvarium, upper cervical spine and extracranial soft tissues are normal. Sinuses/Orbits: No fluid levels or advanced mucosal thickening. No mastoid or middle ear effusion. Normal  orbits. IMPRESSION: 1. Markedly motion degraded examination. 2. Expanded, T2 hyperintense right superior frontal gyrus. The appearance is most suggestive of a neoplastic lesion. However, characterization is again poor due to patient motion. A follow-up examination with more adequate sedation (or anesthesia) and the administration of intravenous contrast material (if not contraindicated) would likely provide better characterization. 3. No diffusion restriction or other evidence of acute ischemia. Electronically Signed   By: Ulyses Jarred M.D.   On: 10/26/2016 16:12   Dg Chest Port 1 View  Result Date: 10/26/2016 CLINICAL DATA:  Shortness of Breath EXAM: PORTABLE CHEST 1 VIEW COMPARISON:  October 24, 2016 FINDINGS: There is atelectatic change in the left base. There is no appreciable edema or consolidation. There is cardiomegaly with pulmonary vascularity within normal limits. There is aortic atherosclerosis. There is a left subclavian vein stent. No bone lesions. No evident adenopathy. IMPRESSION: Left base atelectasis. No edema or consolidation. Stable cardiac prominence. There is aortic atherosclerosis. Aortic Atherosclerosis (ICD10-I70.0). Electronically Signed   By: Lowella Grip III M.D.   On: 10/26/2016 07:38   Dg Abd Portable 2v  Result Date: 10/26/2016 CLINICAL DATA:  Abdominal distention and decreased bowel sounds. EXAM: PORTABLE ABDOMEN - 2 VIEW COMPARISON:  10/07/2016 FINDINGS: Scattered gas and stool in the colon. No small or large bowel distention. No radiopaque stones. Visualized bones appear intact. Surgical clips in the left upper quadrant and pelvis. Thoracolumbar scoliosis convex towards the right. Vascular calcifications. Vascular stent noted in the upper mediastinum. IMPRESSION: Nonobstructive bowel gas pattern. Electronically Signed   By: Lucienne Capers M.D.   On: 10/26/2016 01:44      Scheduled Meds: .  cholecalciferol  2,000 Units Oral Daily  . dexamethasone  4 mg  Intravenous Q6H  . hydrALAZINE  10 mg Intravenous Q6H  . Influenza vac split quadrivalent PF  0.5 mL Intramuscular Tomorrow-1000  . insulin aspart  0-9 Units Subcutaneous Q4H  . ipratropium  0.5 mg Nebulization BID  . levalbuterol  0.63 mg Nebulization BID  . pravastatin  20 mg Oral q1800  . sodium chloride flush  3 mL Intravenous Q12H  . tacrolimus  3 mg Oral BID   Or  . tacrolimus  3 mg Sublingual BID  . tamsulosin  0.4 mg Oral QPC supper   Continuous Infusions: . ampicillin-sulbactam (UNASYN) IV Stopped (11/05/2016 1448)  . dextrose 125 mL (11/03/2016 1316)  . lacosamide (VIMPAT) IV Stopped (11/02/2016 1419)  . levETIRAcetam Stopped (11/19/2016 1402)  . mycophenolate (CELLCEPT) IV Stopped (11/12/2016 0754)  . potassium chloride 10 mEq (11/16/2016 1445)  . valproate sodium 500 mg (11/12/2016 1445)     LOS: 6 days    Time spent in minutes: 35    Debbe Odea, MD [facility] Pager: [email or web address] Password [email or web address] 11/19/2016, 3:57 PM

## 2016-10-28 ENCOUNTER — Inpatient Hospital Stay (HOSPITAL_COMMUNITY): Payer: Medicare HMO | Admitting: Certified Registered Nurse Anesthetist

## 2016-10-28 ENCOUNTER — Encounter (HOSPITAL_COMMUNITY): Admission: EM | Disposition: E | Payer: Self-pay | Source: Home / Self Care | Attending: Internal Medicine

## 2016-10-28 ENCOUNTER — Inpatient Hospital Stay (HOSPITAL_COMMUNITY): Payer: Medicare HMO

## 2016-10-28 ENCOUNTER — Encounter (HOSPITAL_COMMUNITY): Payer: Self-pay

## 2016-10-28 DIAGNOSIS — Z7901 Long term (current) use of anticoagulants: Secondary | ICD-10-CM

## 2016-10-28 DIAGNOSIS — Z791 Long term (current) use of non-steroidal anti-inflammatories (NSAID): Secondary | ICD-10-CM

## 2016-10-28 DIAGNOSIS — K922 Gastrointestinal hemorrhage, unspecified: Secondary | ICD-10-CM

## 2016-10-28 HISTORY — PX: RADIOLOGY WITH ANESTHESIA: SHX6223

## 2016-10-28 LAB — CBC WITH DIFFERENTIAL/PLATELET
Basophils Absolute: 0 10*3/uL (ref 0.0–0.1)
Basophils Relative: 0 %
Eosinophils Absolute: 0 10*3/uL (ref 0.0–0.7)
Eosinophils Relative: 0 %
HCT: 42.5 % (ref 39.0–52.0)
Hemoglobin: 13.5 g/dL (ref 13.0–17.0)
Lymphocytes Relative: 11 %
Lymphs Abs: 0.4 10*3/uL — ABNORMAL LOW (ref 0.7–4.0)
MCH: 29 pg (ref 26.0–34.0)
MCHC: 31.8 g/dL (ref 30.0–36.0)
MCV: 91.2 fL (ref 78.0–100.0)
Monocytes Absolute: 0.3 10*3/uL (ref 0.1–1.0)
Monocytes Relative: 9 %
Neutro Abs: 3.1 10*3/uL (ref 1.7–7.7)
Neutrophils Relative %: 80 %
Platelets: 98 10*3/uL — ABNORMAL LOW (ref 150–400)
RBC: 4.66 MIL/uL (ref 4.22–5.81)
RDW: 14.5 % (ref 11.5–15.5)
WBC: 3.8 10*3/uL — ABNORMAL LOW (ref 4.0–10.5)

## 2016-10-28 LAB — GLUCOSE, CAPILLARY
GLUCOSE-CAPILLARY: 252 mg/dL — AB (ref 65–99)
GLUCOSE-CAPILLARY: 248 mg/dL — AB (ref 65–99)
GLUCOSE-CAPILLARY: 196 mg/dL — AB (ref 65–99)
GLUCOSE-CAPILLARY: 245 mg/dL — AB (ref 65–99)
GLUCOSE-CAPILLARY: 232 mg/dL — AB (ref 65–99)

## 2016-10-28 LAB — RENAL FUNCTION PANEL
ALBUMIN: 2.5 g/dL — AB (ref 3.5–5.0)
ANION GAP: 11 (ref 5–15)
BUN: 37 mg/dL — ABNORMAL HIGH (ref 6–20)
CALCIUM: 9.4 mg/dL (ref 8.9–10.3)
CO2: 22 mmol/L (ref 22–32)
Chloride: 123 mmol/L — ABNORMAL HIGH (ref 101–111)
Creatinine, Ser: 1.53 mg/dL — ABNORMAL HIGH (ref 0.61–1.24)
GFR, EST AFRICAN AMERICAN: 51 mL/min — AB (ref >60)
GFR, EST NON AFRICAN AMERICAN: 44 mL/min — AB (ref >60)
Glucose, Bld: 263 mg/dL — ABNORMAL HIGH (ref 65–99)
PHOSPHORUS: 3.3 mg/dL (ref 2.5–4.6)
Potassium: 4 mmol/L (ref 3.5–5.1)
SODIUM: 156 mmol/L — AB (ref 135–145)

## 2016-10-28 LAB — CBC
HCT: 42.7 % (ref 39.0–52.0)
HEMATOCRIT: 37.9 % — AB (ref 39.0–52.0)
HEMATOCRIT: 40 % (ref 39.0–52.0)
HEMOGLOBIN: 12.2 g/dL — AB (ref 13.0–17.0)
Hemoglobin: 12.9 g/dL — ABNORMAL LOW (ref 13.0–17.0)
Hemoglobin: 12.4 g/dL — ABNORMAL LOW (ref 13.0–17.0)
MCH: 29.3 pg (ref 26.0–34.0)
MCH: 28 pg (ref 26.0–34.0)
MCH: 28.6 pg (ref 26.0–34.0)
MCHC: 32.2 g/dL (ref 30.0–36.0)
MCHC: 30.2 g/dL (ref 30.0–36.0)
MCHC: 31 g/dL (ref 30.0–36.0)
MCV: 91.1 fL (ref 78.0–100.0)
MCV: 92.8 fL (ref 78.0–100.0)
MCV: 92.2 fL (ref 78.0–100.0)
PLATELETS: 81 10*3/uL — AB (ref 150–400)
PLATELETS: 87 10*3/uL — AB (ref 150–400)
Platelets: 90 10*3/uL — ABNORMAL LOW (ref 150–400)
RBC: 4.16 MIL/uL — ABNORMAL LOW (ref 4.22–5.81)
RBC: 4.6 MIL/uL (ref 4.22–5.81)
RBC: 4.34 MIL/uL (ref 4.22–5.81)
RDW: 14.6 % (ref 11.5–15.5)
RDW: 14.6 % (ref 11.5–15.5)
RDW: 14.4 % (ref 11.5–15.5)
WBC: 5.3 10*3/uL (ref 4.0–10.5)
WBC: 3.8 10*3/uL — ABNORMAL LOW (ref 4.0–10.5)
WBC: 3.3 10*3/uL — ABNORMAL LOW (ref 4.0–10.5)

## 2016-10-28 LAB — TYPE AND SCREEN
ABO/RH(D): A POS
Antibody Screen: NEGATIVE

## 2016-10-28 SURGERY — RADIOLOGY WITH ANESTHESIA
Anesthesia: General

## 2016-10-28 MED ORDER — TACROLIMUS 1 MG PO CAPS
1.5000 mg | ORAL_CAPSULE | Freq: Two times a day (BID) | ORAL | Status: DC
  Filled 2016-10-28: qty 1

## 2016-10-28 MED ORDER — FENTANYL CITRATE (PF) 100 MCG/2ML IJ SOLN
INTRAMUSCULAR | Status: DC | PRN
  Administered 2016-10-28: 50 ug via INTRAVENOUS

## 2016-10-28 MED ORDER — LIDOCAINE HCL (CARDIAC) 20 MG/ML IV SOLN
INTRAVENOUS | Status: DC | PRN
  Administered 2016-10-28: 60 mg via INTRAVENOUS

## 2016-10-28 MED ORDER — OXYCODONE HCL 5 MG PO TABS: 5.0000 mg | ORAL_TABLET | Freq: Once | ORAL | Status: DC | PRN

## 2016-10-28 MED ORDER — PROMETHAZINE HCL 25 MG/ML IJ SOLN: 6.2500 mg | INTRAMUSCULAR | Status: DC | PRN

## 2016-10-28 MED ORDER — SODIUM CHLORIDE 0.9 % IV SOLN
80.0000 mg | Freq: Once | INTRAVENOUS | Status: AC
  Administered 2016-10-28: 80 mg via INTRAVENOUS
  Filled 2016-10-28: qty 80

## 2016-10-28 MED ORDER — SODIUM CHLORIDE 0.9 % IV BOLUS (SEPSIS)
500.0000 mL | Freq: Once | INTRAVENOUS | Status: AC
  Administered 2016-10-28: 500 mL via INTRAVENOUS

## 2016-10-28 MED ORDER — TACROLIMUS 0.5 MG PO CAPS
1.5000 mg | ORAL_CAPSULE | Freq: Two times a day (BID) | ORAL | Status: DC
  Filled 2016-10-28 (×28): qty 1

## 2016-10-28 MED ORDER — PANTOPRAZOLE SODIUM 40 MG IV SOLR
40.0000 mg | Freq: Two times a day (BID) | INTRAVENOUS | Status: DC
  Administered 2016-10-28 – 2016-10-31 (×7): 40 mg via INTRAVENOUS
  Filled 2016-10-28 (×7): qty 40

## 2016-10-28 MED ORDER — ROCURONIUM BROMIDE 100 MG/10ML IV SOLN
INTRAVENOUS | Status: DC | PRN
  Administered 2016-10-28: 50 mg via INTRAVENOUS

## 2016-10-28 MED ORDER — TACROLIMUS 1 MG PO CAPS
3.0000 mg | ORAL_CAPSULE | Freq: Two times a day (BID) | ORAL | Status: DC
  Filled 2016-10-28: qty 3

## 2016-10-28 MED ORDER — ONDANSETRON HCL 4 MG/2ML IJ SOLN
INTRAMUSCULAR | Status: DC | PRN
  Administered 2016-10-28: 4 mg via INTRAVENOUS

## 2016-10-28 MED ORDER — ALBUTEROL SULFATE (2.5 MG/3ML) 0.083% IN NEBU: 2.5000 mg | INHALATION_SOLUTION | Freq: Four times a day (QID) | RESPIRATORY_TRACT | Status: DC | PRN

## 2016-10-28 MED ORDER — LABETALOL HCL 5 MG/ML IV SOLN
20.0000 mg | Freq: Four times a day (QID) | INTRAVENOUS | Status: DC
  Administered 2016-10-28 – 2016-10-29 (×4): 20 mg via INTRAVENOUS
  Filled 2016-10-28 (×3): qty 4

## 2016-10-28 MED ORDER — OXYCODONE HCL 5 MG/5ML PO SOLN: 5.0000 mg | Freq: Once | ORAL | Status: DC | PRN

## 2016-10-28 MED ORDER — NEOSTIGMINE METHYLSULFATE 10 MG/10ML IV SOLN
INTRAVENOUS | Status: DC | PRN
  Administered 2016-10-28: 5 mg via INTRAVENOUS

## 2016-10-28 MED ORDER — PROPOFOL 10 MG/ML IV BOLUS
INTRAVENOUS | Status: DC | PRN
Start: 2016-10-28 — End: 2016-10-28
  Administered 2016-10-28: 110 mg via INTRAVENOUS

## 2016-10-28 MED ORDER — HYDROMORPHONE HCL 1 MG/ML IJ SOLN: 0.2500 mg | INTRAMUSCULAR | Status: DC | PRN

## 2016-10-28 MED ORDER — GLYCOPYRROLATE 0.2 MG/ML IJ SOLN
INTRAMUSCULAR | Status: DC | PRN
  Administered 2016-10-28: .8 mg via INTRAVENOUS

## 2016-10-28 MED ORDER — TACROLIMUS 1 MG PO CAPS
3.0000 mg | ORAL_CAPSULE | Freq: Two times a day (BID) | ORAL | Status: DC
  Administered 2016-10-29 – 2016-11-12 (×28): 3 mg via ORAL
  Filled 2016-10-28 (×28): qty 3

## 2016-10-28 MED ORDER — DEXAMETHASONE SODIUM PHOSPHATE 4 MG/ML IJ SOLN
2.0000 mg | Freq: Four times a day (QID) | INTRAMUSCULAR | Status: DC
  Administered 2016-10-28 – 2016-10-29 (×5): 2 mg via INTRAVENOUS
  Filled 2016-10-28 (×5): qty 1

## 2016-10-28 MED ORDER — LABETALOL HCL 5 MG/ML IV SOLN
20.0000 mg | Freq: Four times a day (QID) | INTRAVENOUS | Status: DC
  Administered 2016-10-28: 20 mg via INTRAVENOUS
  Filled 2016-10-28 (×2): qty 4

## 2016-10-28 MED ORDER — GADOBENATE DIMEGLUMINE 529 MG/ML IV SOLN
10.0000 mL | Freq: Once | INTRAVENOUS | Status: AC
  Administered 2016-10-28: 10 mL via INTRAVENOUS

## 2016-10-28 NOTE — Progress Notes (Addendum)
Noted to have 2 bloody stools last night. On my exam, he has pure fresh blood in between legs. Condom cath in place and no bleeding from it so likely GI.  No h/o GI bleed per chart.   Plan: - Start protonix IV- has been on decadron and chronically on Prednisone- ? Peptic ulcer - wean decadron today - no drop in Hb yet-  frequent Hb and close BP and I and O monitoring, type and screen - noted to be tachycardic this AM-  will give fluid bolus - already NPO - GI consult- [place]   Full note to follow  Debbe Odea, MD

## 2016-10-28 NOTE — Anesthesia Postprocedure Evaluation (Signed)
Anesthesia Post Note  Patient: SAYED APOSTOL  Procedure(s) Performed: MRI BRAIN (N/A )     Patient location during evaluation: PACU Anesthesia Type: General Level of consciousness: awake and alert Pain management: pain level controlled Vital Signs Assessment: post-procedure vital signs reviewed and stable Respiratory status: spontaneous breathing, nonlabored ventilation, respiratory function stable and patient connected to nasal cannula oxygen Cardiovascular status: blood pressure returned to baseline and stable Postop Assessment: no apparent nausea or vomiting Anesthetic complications: no    Last Vitals:  Vitals:   11/13/2016 1549 11/07/2016 1558  BP: (!) 155/99 (!) 162/94  Pulse: 81 78  Resp: (!) 27   Temp: (!) 36.4 C (!) 36.3 C  SpO2: 100% 100%    Last Pain:  Vitals:   11/05/2016 1558  TempSrc: Axillary  PainSc: Asleep                 Lynda Rainwater

## 2016-10-28 NOTE — Anesthesia Procedure Notes (Signed)
Procedure Name: Intubation Date/Time: 11/13/2016 2:10 PM Performed by: Bryson Corona Pre-anesthesia Checklist: Patient identified, Emergency Drugs available, Suction available and Patient being monitored Patient Re-evaluated:Patient Re-evaluated prior to induction Oxygen Delivery Method: Circle System Utilized Preoxygenation: Pre-oxygenation with 100% oxygen Induction Type: IV induction Ventilation: Mask ventilation without difficulty Laryngoscope Size: Mac and 4 Grade View: Grade I Tube type: Oral Tube size: 7.5 mm Number of attempts: 1 Airway Equipment and Method: Stylet Placement Confirmation: ETT inserted through vocal cords under direct vision,  positive ETCO2 and breath sounds checked- equal and bilateral Tube secured with: Tape Dental Injury: Teeth and Oropharynx as per pre-operative assessment

## 2016-10-28 NOTE — Progress Notes (Signed)
Occupational Therapy Discharge Patient Details Name: Roger Gibson MRN: [id number] DOB: Dec 02, 1945 Today's Date: 10/26/2016 Time:  -     Patient discharged from OT services secondary to medical decline - will need to re-order OT to resume therapy services.  Please see latest therapy progress note for current level of functioning and progress toward goals.    Progress and discharge plan discussed with patient and/or caregiver: Patient unable to participate in discharge planning and no caregivers available  [place], [place], OTR/L [phone]  11/13/2016, 11:03 AM

## 2016-10-28 NOTE — Progress Notes (Signed)
  Speech Language Pathology Treatment: Dysphagia  Patient Details Name: Roger Gibson MRN: [id number] DOB: 10/27/1945 Today's Date: 11/07/2016 Time: [id number] SLP Time Calculation (min) (ACUTE ONLY): 25 min  Assessment / Plan / Recommendation Clinical Impression  Pt was seen for skilled ST targeting dysphagia goals.  MRI completed yesterday with findings of stable right frontal neoplasm.  Pt now also has likely GI bleed per conversations with both RN and MD.  Pt is significantly more lethargic in comparison to initial evaluation which leads to impaired automaticity of swallow response.  Pt awoke easily to voice and light touch and remained awake during oral care provided by SLP; however, when SLP administered a single ice chip bolus pt began falling asleep with ice chip in his mouth and needed max to total assist to maintain alertness and attend to bolus.  This lead to what appeared to be a significant delay in swallow initiation which was followed by weak, delayed coughing and is concerning for airway invasion.  Continue to recommend at least short term alternative means of nutrition as pt has now not had POs since 9/28 and SLP can not safely recommend PO intake of even ice chips or sips with meds at this time due to functional decline.  SLP will continue to follow up for PO readiness.  Continue to recommend aggressive oral care while NPO to minimize bacterial load and subsequent risk of development of infectious process.    HPI HPI: Roger Gibson a 71 y.o.malewith medical history significant for hypertension, chronic combined systolic/diastolic CHF, history of right kidney cancer, end-stage renal disease status post renal transplantation, that presented to the emergency department with left-sided weakness. Noncontrast head CT reveals a small right frontal lobe abnormality of uncertain etiology. This was followed by MRI brain which was aborted early due to patient intolerance, but notable for  abnormal signal at the right superior frontal gyrus consistent with vasogenic edema. He was admitted to the telemetry unit for ongoing evaluation and management of acute neurologic deficits with vasogenic edema noted on a limited MRI.      SLP Plan  Continue with current plan of care       Recommendations  Diet recommendations: NPO Medication Administration: Via alternative means                Oral Care Recommendations: Oral care QID Follow up Recommendations: Other (comment) (TBD) SLP Visit Diagnosis: Dysphagia, unspecified (R13.10) Plan: Continue with current plan of care       GO                Roger Gibson, Roger Gibson 11/06/2016, 8:52 AM

## 2016-10-28 NOTE — Progress Notes (Signed)
PT refused to wear Bipap.  RT will continue to monitor.

## 2016-10-28 NOTE — Progress Notes (Signed)
PT Cancellation Note  Patient Details Name: Roger Gibson MRN: [id number] DOB: 10-Oct-1945   Cancelled Treatment:    Reason Eval/Treat Not Completed: Medical issues which prohibited therapy (New GIB per nurse.  Nurse asked PT to sign off.  )Please reconsult if pt status changes.  Thanks.    Godfrey Pick Elber Galyean 11/16/2016, 10:45 AM  [facility] [phone] (pager)

## 2016-10-28 NOTE — Progress Notes (Signed)
Physical Therapy Discharge Patient Details Name: Roger Gibson MRN: [id number] DOB: 1945/08/08 Today's Date: 11/18/2016 Time:  -     Patient discharged from PT services secondary to medical decline - will need to re-order PT to resume therapy services.  Please see latest therapy progress note for current level of functioning and progress toward goals.    Progress and discharge plan discussed with patient and/or caregiver: Patient unable to participate in discharge planning and no caregivers available  GP     Denice Paradise 11/15/2016, 10:46 AM  [facility] [phone] (pager)

## 2016-10-28 NOTE — Progress Notes (Signed)
Telephone consent received from wife, Servando Kyllonen, for another MRI brain, w/non-iodinated contrast (wife was concerned re: contrast dye allergy; however MRI staff states this dye is not iodinated) under anesthesia.

## 2016-10-28 NOTE — Progress Notes (Addendum)
[facility] Rounding Note  Subjective:    Bloody stools during the night GI to be consulted Hb unchamged, getting bolus for tachycardia Had MRI - brain mass - neurosurgery consulted  Objective Vital signs in last 24 hours: Vitals:   11/16/2016 0000 11/18/2016 0307 11/04/2016 0400 10/31/2016 0734  BP: (!) 121/97 (!) 145/97 (!) 152/79   Pulse:      Resp: (!) 29 (!) 33 (!) 27   Temp:  97.8 F (36.6 C)  97.6 F (36.4 C)  TempSrc:  Axillary  Oral  SpO2: 97% 100% 100%   Weight:      Height:       Weight change:   Intake/Output Summary (Last 24 hours) at 10/26/2016 0855 Last data filed at 11/09/2016 0734  Gross per 24 hour  Intake                0 ml  Output              775 ml  Net             -775 ml   Physical Exam: Cheyne stokes breathing pattern Wakes up during hyperpneic phase and able to tell me no pain, no headache, no SOB and THIRSTY VS as noted  Regular rhythm S1S2 No S3 Tachy 90's Abd soft.  No obvious allograft tenderness/transplant left pelvis firm and not tender No LE edema Condom cath is off, some blood from stool on sheets   Recent Labs Lab 10/26/16 0144 10/26/2016 0516 11/23/2016 0213  NA 155* 155* 156*  K 3.8 3.4* 4.0  CL 120* 121* 123*  CO2 26 24 22   GLUCOSE 221* 198* 263*  BUN 34* 30* 37*  CREATININE 1.71* 1.34* 1.53*  CALCIUM 10.1 10.0 9.4  PHOS 2.2* 2.5 3.3    Recent Labs Lab 10/25/16 0732 10/26/16 0144 11/09/2016 0516 10/30/2016 0213  AST 145* 113* 66*  --   ALT 60 62 52  --   ALKPHOS 69 56 62  --   BILITOT 0.9 0.8 0.9  --   PROT 7.3 6.9 6.1*  --   ALBUMIN 2.9* 2.8* 2.5* 2.5*     Recent Labs Lab 10/24/16 0728 10/25/16 0732 10/26/16 0144 11/03/2016 0516 11/04/2016 0213  WBC 10.4 10.5 8.4 5.5 3.8*  NEUTROABS  --  9.5* 7.2 4.8 3.1  HGB 12.1* 14.6 14.2 13.4 13.5  HCT 36.6* 43.6 43.9 42.4 42.5  MCV 87.6 87.6 89.2 88.9 91.2  PLT 121* 107* 143* 124* 98*     Recent Labs Lab 10/12/2016 1919 09/26/2016 2325 10/22/16 0638 10/23/16 1022  CKTOTAL 9,711*   --  12,724* 6,006*  CKMB  --   --   --  6.6*  TROPONINI 0.13* 0.17* 0.22* 0.12*   CBG:  Recent Labs Lab 11/03/2016 1632 11/06/2016 1912 11/11/2016 2347 11/07/2016 0310 11/03/2016 0747  GLUCAP 178* 163* 206* 252* 248*   Studies/Results: Mr Brain Wo Contrast  Result Date: 11/02/2016 CLINICAL DATA:  Altered level of consciousness. EXAM: MRI HEAD WITHOUT CONTRAST TECHNIQUE: Multiplanar, multiecho pulse sequences of the brain and surrounding structures were obtained without intravenous contrast. COMPARISON:  Motion degraded MR brain 10/26/2016. CT head 10/22/2016. MR brain 10/19/2016 FINDINGS: Brain: Redemonstrated is abnormal enlarged RIGHT frontal gyrus, T2 and FLAIR hyperintense, facilitated diffusion, without hemorrhage. No other similar lesions elsewhere. Global atrophy. Vascular: Normal flow voids. Skull and upper cervical spine: Normal marrow signal. Sinuses/Orbits: Negative. Other: None. 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 Contrast not administered because  of the patient's dialysis status. Electronically Signed   By: Staci Righter M.D.   On: 10/26/2016 11:27   Mr Brain Wo Contrast  Result Date: 10/26/2016 CLINICAL DATA:  Left-sided weakness EXAM: MRI HEAD WITHOUT CONTRAST TECHNIQUE: Multiplanar, multiecho pulse sequences of the brain and surrounding structures were obtained without intravenous contrast. COMPARISON:  Brain MRI 09/26/2016 and head CT 10/22/2016 FINDINGS: The study is degraded by motion, despite efforts to reduce this artifact, including utilization of motion-resistant MR sequences. The findings of the study are interpreted in the context of reduced sensitivity/specificity. Brain: The midline structures are normal. There is expansion of the right superior frontal gyrus with associated hyperintense T2 weighted signal. No corresponding diffusion restriction. Brain parenchymal signal is otherwise normal. Moderate diffuse atrophy. The  dura is normal and there is no extra-axial collection. Vascular: Limited visualization of flow voids is normal. Skull and upper cervical spine: The visualized skull base, calvarium, upper cervical spine and extracranial soft tissues are normal. Sinuses/Orbits: No fluid levels or advanced mucosal thickening. No mastoid or middle ear effusion. Normal orbits. IMPRESSION: 1. Markedly motion degraded examination. 2. Expanded, T2 hyperintense right superior frontal gyrus. The appearance is most suggestive of a neoplastic lesion. However, characterization is again poor due to patient motion. A follow-up examination with more adequate sedation (or anesthesia) and the administration of intravenous contrast material (if not contraindicated) would likely provide better characterization. 3. No diffusion restriction or other evidence of acute ischemia. Electronically Signed   By: Ulyses Jarred M.D.   On: 10/26/2016 16:12   Medications: Infusions: . dextrose 125 mL/hr at 11/13/2016 0221  . lacosamide (VIMPAT) IV Stopped (11/24/2016 2333)  . levETIRAcetam Stopped (11/06/2016 2255)  . mycophenolate (CELLCEPT) IV 250 mg (11/10/2016 0731)  . pantoprazole (PROTONIX) IV    . sodium chloride    . valproate sodium Stopped (11/09/2016 0003)    Scheduled Medications: . cholecalciferol  2,000 Units Oral Daily  . dexamethasone  2 mg Intravenous Q6H  . hydrALAZINE  10 mg Intravenous Q6H  . Influenza vac split quadrivalent PF  0.5 mL Intramuscular Tomorrow-1000  . insulin aspart  0-9 Units Subcutaneous Q4H  . ipratropium  0.5 mg Nebulization BID  . levalbuterol  0.63 mg Nebulization BID  . pantoprazole (PROTONIX) IV  40 mg Intravenous Q12H  . pravastatin  20 mg Oral q1800  . sodium chloride flush  3 mL Intravenous Q12H  . tacrolimus  3 mg Oral BID   Or  . tacrolimus  3 mg Sublingual BID  . 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 Baseline creatinine between 1.5 and 1.7. Pred/prograf/myfortic. 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 Presented to ED with left-sided weakness, admitted with sz- brain imaging possible/probable frontal lobe brain mass  1. Renal xpl #2 - BL creatinine 1.5-1.7. AKI on CKD (creatinine 2.29 on admission) on setting of hospitalization for possibly stroke/seizure/possible brain tumor with decreased mental status.  1. Creatinine back to baseline.  2. Pharmacy assisting  to get his antirejection medications changed to alternate form.  3. TAC trough ^ 10.3.  4. Reduced prograf dosing to 3 mg BID 10/2  and plan to recheck level 10/7.  5. Decadron for brain edema so prednisone on hold 2. Metabolic acidosis - resolved 3. Hypokalemia - replete  prn 4. Hypernatremia - ^150/hour (ideally needs small feeding tube for nutrition and free water administration once GI issues sorted out) 5. Frontal lobe lesion/left sided weakness/lethargy - thought now most likely tumor. Neurosurgery to see re brain bx 6. Systolic and diastolic HF - + fluid balance 2/2 bicarb drip. (drip stopped, lasix not resumed) 7. HTN -  BP remains elevated. Has IV hydralazine ordered, mildly tachycardic maybe on that basis (tachy all this week).  1. Uses labetolol at home.  2. Add standing IV labetolol rather than prn.   Jamal Maes, MD [facility] [phone] Pager 11/21/2016, 8:55 AM

## 2016-10-28 NOTE — Anesthesia Preprocedure Evaluation (Addendum)
Anesthesia Evaluation  Patient identified by MRN, date of birth, ID band Patient confused    Reviewed: Allergy & Precautions, NPO status , Patient's Chart, lab work & pertinent test results  Airway Mallampati: III  TM Distance: >3 FB Neck ROM: Full    Dental  (+) Dental Advisory Given, Poor Dentition, Missing, Chipped   Pulmonary former smoker,    Pulmonary exam normal breath sounds clear to auscultation       Cardiovascular hypertension, + Cardiac Stents and +CHF  Normal cardiovascular exam+ Valvular Problems/Murmurs MR  Rhythm:Regular Rate:Normal  TTE 9/18 - Left ventricle: The cavity size was normal. Wall thickness was  increased in a pattern of moderate LVH. Systolic function was  normal. The estimated ejection fraction was in the range of 55%  to 60%. Wall motion was normal; there were no regional wall  motion abnormalities. Doppler parameters are consistent with   abnormal left ventricular relaxation (grade 1 diastolic   dysfunction). - Aortic valve: Mildly calcified annulus. Trileaflet; normal   thickness leaflets. - Mitral valve: There was mild regurgitation. - Left atrium: The atrium was mildly dilated   Neuro/Psych PSYCHIATRIC DISORDERS negative neurological ROS     GI/Hepatic negative GI ROS, Neg liver ROS,   Endo/Other  negative endocrine ROS  Renal/GU DialysisRenal diseaseRight renal ca.  negative genitourinary   Musculoskeletal negative musculoskeletal ROS (+)   Abdominal   Peds  Hematology  (+) anemia ,   Anesthesia Other Findings Gout  Reproductive/Obstetrics                             Anesthesia Physical  Anesthesia Plan  ASA: IV  Anesthesia Plan: General   Post-op Pain Management:    Induction: Intravenous  PONV Risk Score and Plan: 2 and Ondansetron, Dexamethasone and Treatment may vary due to age or medical condition  Airway Management Planned: Oral  ETT  Additional Equipment: None  Intra-op Plan:   Post-operative Plan: Extubation in OR  Informed Consent: I have reviewed the patients History and Physical, chart, labs and discussed the procedure including the risks, benefits and alternatives for the proposed anesthesia with the patient or authorized representative who has indicated his/her understanding and acceptance.   Dental advisory given  Plan Discussed with: CRNA  Anesthesia Plan Comments:        Anesthesia Quick Evaluation

## 2016-10-28 NOTE — Progress Notes (Signed)
OT Cancellation and Discharge Note  Patient Details Name: [name] MRN: [id number] DOB: 25-Oct-1945   Cancelled Treatment:    Reason Eval/Treat Not Completed: Medical issues which prohibited therapy.  Per nurse.  Pt with new GIB and nurse suggested OT/PT sign off.  Will need new orders to resume.    [place], OTR/L [phone]   Lucille Passy M 11/12/2016, 11:02 AM

## 2016-10-28 NOTE — Consult Note (Signed)
Referring Provider: [facility]    Primary Care Physician:  [name], MD Primary Gastroenterologist:  Zenovia Jarred,  MD  Reason for Consultation:  GI bleed   Attending physician's note   I have taken a history, examined the patient and reviewed the chart. I agree with the Advanced Practitioner's note, impression and recommendations. Hematochezia, hemodynamically stable with Hb=13.5. Possible hemorrhoidal, diverticular source. If he has persistent GI bleeding would proceed with a nuclear tagged bleeding scan and consider angiogram if bleeding continues. Trend CBC. Decisions pending on overall mgmt of brain mass and neurologic status. He is high risk for endoscopic procedures at this time, high risk for recurrent seizures.   Roger Edward, MD Roger Gibson [phone] Mon-Fri 8a-5p [phone] after 5p, weekends, holidays   ASSESSMENT AND PLAN:  70. 71 yo male with multiple medical problems admitted with neurologic symptoms, found to have brain mass. He has developed hematochezia in absence of blood thinners / NSAIDs. He takes prednisone + asa at home putting him at risk for PUD. On chronic PPI as well. He has been getting IV steroids. Could be a diverticular hemorrhage though I cannot find documentation of diverticular disease on imaging or on colonoscopy in 2013. Brisk UGI bleed is unlikely but would expect some change in vitals and significant increase in BUN by now.  -Patient is obviously at high risk for procedures. Bleeding scan is option but not sure he could undergo angio if scan was positive.  -Agree with BID IV PPI.  -Monitor hgb, transfuse if appropriate.   2. Cholelithiasis / trace pneumobilia in LH duct and CBD. Normal bili and alk phos. Fluctuating alk phos with normal ALT  3. Hypernatremia, on vimpat  4. Renal transplant, on chronic immunosuppresants. He is admitted with AKI on CKD 3  5. Frontal brain mass. For possible bx pending conversation with wife.   6. Hx of RCC.    7. Hx of tubular adenomatous colon polyps without HGD. Colonoscopy otherwise normal in 2013.    HPI: Roger Gibson is a 71 y.o. male with multiple medical problems. He has chronic combined systolic and diastolic heart failure. He has a hx of RCC is s/p renal transplant for ESRD. Has AKI on CKD 3. He presented to ED 9/27 with left sided weakness. CT scan suggested right frontal vasogenic edema. Developed seizure activity, now diagnosed with right frontal mass. On keprra and depakote. For possible biopsy of mass.   Patient cannot provide history. Around midnight patient passed maroon stool x2. This am Hospitalist found fresh blood. He has been hypertensive , tachycardic for days and this is unchanged despite bleeding.    Past Medical History:  Diagnosis Date  . Acute focal neurological deficit 09/2016  . Allergy   . Blood transfusion without reported diagnosis   . Cancer ([place])    right kidney ca  . Confusion    during admission to [facility] 2012, persisted after discharge  . ESRD (end stage renal disease) ([employer])   . ESRD (end stage renal disease) on dialysis ([place])    T, Th, Sat HD in [place], [place]  . Gout 01/2002   Right knee; Left great toe  . HLD (hyperlipidemia) 1999  . HTN (hypertension) 1974  . Iron deficiency anemia    PRE 2004/06/2000  . Renal cysts, acquired, bilateral    per Darol Destine    Past Surgical History:  Procedure Laterality Date  . [place]  COLONOSCOPY  05/15/03   Polyp (biopsy negative) repeat in 5 years  . COLONOSCOPY  07/18/08   1 rectal polyp (Dr. Wynetta Emery) 5 years  . CT ABD W & PELVIS WO CM  03/03/06   Right kidney absent; Left with cystic changes  . CYSTOSCOPY  03/03/06   Mild to mod. BPH observed/ otherwise normal  . KIDNEY TRANSPLANT  Jul 14, 2006   deceased donor-RLQ Abd, partial neph for renal cell CA/complete Nephrectomy for infection ([facility])  . MR MRA ABDOMEN  2000   3x3 mass  left kidney  . NEPHRECTOMY  1965   Left (infection) [place]  . NEPHRECTOMY  2000   partial-(2nd to renal carcinoma) Dr. [place] [facility]    Prior to Admission medications   Medication Sig Start Date End Date Taking? Authorizing Provider  aspirin EC 81 MG tablet Take 81 mg by mouth daily.   Yes [provider]  Cholecalciferol 2000 units TABS Take 2,000 Units by mouth daily.   Yes [provider]  furosemide (LASIX) 20 MG tablet Take 10 mg by mouth daily.    Yes [provider]  labetalol (NORMODYNE) 100 MG tablet Take 400 mg by mouth 2 (two) times daily.    Yes [provider]  Multiple Vitamin (MULTIVITAMIN WITH MINERALS) TABS tablet Take 1 tablet by mouth daily.   Yes [provider]  mycophenolate (MYFORTIC) 180 MG EC tablet Take 180 mg by mouth 2 (two) times daily.    Yes [provider]  pravastatin (PRAVACHOL) 20 MG tablet Take 20 mg by mouth every evening.   Yes [provider]  predniSONE (DELTASONE) 5 MG tablet Take 5 mg by mouth daily with breakfast.    Yes [provider]  sodium bicarbonate 650 MG tablet Take 650 mg by mouth 2 (two) times daily.   Yes [provider]  tacrolimus (PROGRAF) 1 MG capsule Take 4 mg by mouth 2 (two) times daily.    Yes [provider]  tamsulosin (FLOMAX) 0.4 MG CAPS capsule Take 0.4 mg by mouth daily after supper.   Yes [provider]  omeprazole (PRILOSEC) 20 MG capsule Take 20 mg by mouth daily.    [provider]  sertraline (ZOLOFT) 50 MG tablet Take 50 mg by mouth daily.    [provider]    Current Facility-Administered Medications  Medication Dose Route Frequency Provider Last Rate Last Dose  . acetaminophen (TYLENOL) tablet 650 mg  650 mg Oral Q6H PRN Opyd, Ilene Qua, MD       Or  . acetaminophen (TYLENOL) suppository 650 mg  650 mg Rectal Q6H PRN Opyd, Ilene Qua, MD   650 mg at 10/22/16 1506  . bisacodyl  (DULCOLAX) suppository 10 mg  10 mg Rectal Daily PRN Opyd, Ilene Qua, MD   10 mg at 10/26/16 0511  . camphor-menthol (SARNA) lotion   Topical PRN Jani Gravel, MD   1 application at 03/50/09 0055  . cholecalciferol (VITAMIN D) tablet 2,000 Units  2,000 Units Oral Daily Opyd, Ilene Qua, MD   2,000 Units at 10/24/16 0904  . dexamethasone (DECADRON) injection 2 mg  2 mg Intravenous Q6H Rizwan, Saima, MD      . dextrose 5 % solution   Intravenous Continuous Jamal Maes, MD 150 mL/hr at 11/15/2016 0926 150 mL at 11/23/2016 0926  . hydrALAZINE (APRESOLINE) injection 10 mg  10 mg Intravenous Q6H Corliss Parish, MD   10 mg at 11/20/2016 0941  . Influenza vac split  quadrivalent PF (FLUZONE HIGH-DOSE) injection 0.5 mL  0.5 mL Intramuscular Tomorrow-1000 Sheikh, Omair Latif, DO      . insulin aspart (novoLOG) injection 0-9 Units  0-9 Units Subcutaneous Q4H Raiford Noble [place], [place]   3 Units at 11/09/2016 7341  . ipratropium (ATROVENT) nebulizer solution 0.5 mg  0.5 mg Nebulization BID Raiford Noble [place], DO   0.5 mg at 11/24/2016 9379  . labetalol (NORMODYNE,TRANDATE) injection 20 mg  20 mg Intravenous Q6H Jamal Maes, MD      . lacosamide (VIMPAT) 200 mg in sodium chloride 0.9 % 25 mL IVPB  200 mg Intravenous Q12H Greta Doom, MD   Stopped at 10/25/2016 [phone]  . levalbuterol (XOPENEX) nebulizer solution 0.63 mg  0.63 mg Nebulization BID Raiford Noble [place], DO   0.63 mg at 11/21/2016 9735  . levETIRAcetam (KEPPRA) 750 mg in sodium chloride 0.9 % 100 mL IVPB  750 mg Intravenous Q12H Greta Doom, MD   Stopped at 11/03/2016 2255  . morphine 4 MG/ML injection 1-3 mg  1-3 mg Intravenous Q4H PRN Opyd, Ilene Qua, MD   2 mg at 10/24/16 0905  . mycophenolate (CELLCEPT) 250 mg in dextrose 5 % 42.5 mL IVPB  250 mg Intravenous Q12H Lyndee Leo, [facility]   Stopped at 11/06/2016 3299  . ondansetron (ZOFRAN) tablet 4 mg  4 mg Oral Q6H PRN Opyd, Ilene Qua, MD       Or  . ondansetron (ZOFRAN) injection 4 mg  4  mg Intravenous Q6H PRN Opyd, Ilene Qua, MD      . pantoprazole (PROTONIX) 80 mg in sodium chloride 0.9 % 100 mL IVPB  80 mg Intravenous Once Debbe Odea, MD      . pantoprazole (PROTONIX) injection 40 mg  40 mg Intravenous Q12H Debbe Odea, MD   40 mg at 11/13/2016 0937  . pravastatin (PRAVACHOL) tablet 20 mg  20 mg Oral q1800 Opyd, Ilene Qua, MD   20 mg at 10/24/16 1733  . senna-docusate (Senokot-S) tablet 1 tablet  1 tablet Oral QHS PRN Opyd, Ilene Qua, MD      . sodium chloride 0.9 % bolus 500 mL  500 mL Intravenous Once Debbe Odea, MD 250 mL/hr at 11/04/2016 0913 500 mL at 10/27/2016 0913  . sodium chloride flush (NS) 0.9 % injection 3 mL  3 mL Intravenous Q12H Opyd, Ilene Qua, MD   3 mL at 10/26/2016 0947  . tacrolimus (PROGRAF) capsule 3 mg  3 mg Oral BID Jamal Maes, MD       Or  . tacrolimus (PROGRAF) capsule 3 mg  3 mg Sublingual BID Jamal Maes, MD   3 mg at 11/08/2016 2426  . tamsulosin (FLOMAX) capsule 0.4 mg  0.4 mg Oral QPC supper Opyd, Ilene Qua, MD   0.4 mg at 10/24/16 1733  . valproate (DEPACON) 500 mg in dextrose 5 % 50 mL IVPB  500 mg Intravenous Q8H Greta Doom, MD 55 mL/hr at 10/28/16 0943 500 mg at 10/28/16 0943    Allergies as of 10/11/2016 - Review Complete 10/16/2016  Allergen Reaction Noted  . Iodinated diagnostic agents Hives and Swelling 05/14/2014  . Iodine Hives and Swelling 06/27/2011  . Tape Rash 06/27/2011    Family History  Problem Relation Age of Onset  . Stomach cancer Father        ?  [place] Kitchen Hypertension Mother        On dialysis for years  . Hypertension Brother   . Hypertension Brother   .  Hypertension Brother   . Hypertension Sister   . Breast cancer Sister        s/p mastectomy  . COPD Sister   . Hypertension Sister   . Kidney failure Sister        on dialysis  . Heart attack Paternal Grandfather   . Colon cancer Neg Hx   . Esophageal cancer Neg Hx   . Rectal cancer Neg Hx     Social History   Social History  . Marital  status: Married    Spouse name: N/A  . Number of children: 3  . Years of education: N/A   Occupational History  . Retired-policeman    Social History Main Topics  . Smoking status: Former Smoker    Types: Cigarettes, Cigars    Quit date: 01/25/1998  . Smokeless tobacco: Never Used     Comment: occasional cigar  . Alcohol use No     Comment: very rare  . Drug use: No  . Sexual activity: Not on file   Other Topics Concern  . Not on file   Social History Narrative   Separated from wife 12/2006      Retired policeman, was in [place]      In [place] since 1997      From [place]      Likes to golf      3 children out of home    Review of Systems: All systems reviewed and negative except where noted in HPI.  Physical Exam: Vital signs in last 24 hours: Temp:  [97.6 F (36.4 C)-98.8 F (37.1 C)] 97.6 F (36.4 C) (10/04 0734) Pulse Rate:  [86-105] 92 (10/03 1245) Resp:  [15-33] 27 (10/04 0400) BP: (121-183)/(79-115) 152/79 (10/04 0400) SpO2:  [96 %-100 %] 100 % (10/04 0400) Last BM Date: 11/01/2016 General:   Arousable well developed black male in NAD Psych: cooperative.  Eyes:  Pupils equal, sclera clear.  Conjunctiva pink. Ears:  Normal auditory acuity. Nose:  No deformity, discharge,  or lesions. Neck:  Supple; no masses Lungs:  Clear throughout to auscultation.   No wheezes, crackles, or rhonchi.  Heart:  Tachy around 104, Regular rhythm Abdomen:  Soft, non-distended, nontender, BS active, no palp mass    Rectal:  Large amount of soft stool mixed with red blood in vault  Msk:  Symmetrical without gross deformities. . Neurologic:  arousable for a few seconds. Brother arrived, patient didn't recognize him. Skin:  Intact without significant lesions or rashes..   Intake/Output from previous day: 10/03 0701 - 10/04 0700 In: -  Out: 1075 [Urine:1075] Intake/Output this shift: Total I/O In: -  Out: 100 [Urine:100]  Lab Results:  Recent Labs  10/26/16 0144  11/03/2016 0516 11/13/2016 0213  WBC 8.4 5.5 3.8*  HGB 14.2 13.4 13.5  HCT 43.9 42.4 42.5  PLT 143* 124* 98*   BMET  Recent Labs  10/26/16 0144 11/18/2016 0516 11/05/2016 0213  NA 155* 155* 156*  K 3.8 3.4* 4.0  CL 120* 121* 123*  CO2 26 24 22  GLUCOSE 221* 198* 263*  BUN 34* 30* 37*  CREATININE 1.71* 1.34* 1.53*  CALCIUM 10.1 10.0 9.4   LFT  Recent Labs  10/25/2016 0516 11/11/2016 0213  PROT 6.1*  --   ALBUMIN 2.5* 2.5*  AST 66*  --   ALT 52  --   ALKPHOS 62  --   BILITOT 0.9  --    PT/INR No results for input(s): LABPROT, INR in the last   72 hours. Hepatitis Panel No results for input(s): HEPBSAG, HCVAB, HEPAIGM, HEPBIGM in the last 72 hours.    Studies/Results: Mr Brain Wo Contrast  Result Date: 11/08/2016 CLINICAL DATA:  Altered level of consciousness. EXAM: MRI HEAD WITHOUT CONTRAST TECHNIQUE: Multiplanar, multiecho pulse sequences of the brain and surrounding structures were obtained without intravenous contrast. COMPARISON:  Motion degraded MR brain 10/26/2016. CT head 10/22/2016. MR brain 09/30/2016 FINDINGS: Brain: Redemonstrated is abnormal enlarged RIGHT frontal gyrus, T2 and FLAIR hyperintense, facilitated diffusion, without hemorrhage. No other similar lesions elsewhere. Global atrophy. Vascular: Normal flow voids. Skull and upper cervical spine: Normal marrow signal. Sinuses/Orbits: Negative. Other: None. 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 Contrast not administered because of the patient's dialysis status. Electronically Signed   By: John T Curnes M.D.   On: 11/20/2016 11:27   Mr Brain Wo Contrast  Result Date: 10/26/2016 CLINICAL DATA:  Left-sided weakness EXAM: MRI HEAD WITHOUT CONTRAST TECHNIQUE: Multiplanar, multiecho pulse sequences of the brain and surrounding structures were obtained without intravenous contrast. COMPARISON:  Brain MRI 10/17/2016 and head CT 10/22/2016 FINDINGS: The study is degraded by  motion, despite efforts to reduce this artifact, including utilization of motion-resistant MR sequences. The findings of the study are interpreted in the context of reduced sensitivity/specificity. Brain: The midline structures are normal. There is expansion of the right superior frontal gyrus with associated hyperintense T2 weighted signal. No corresponding diffusion restriction. Brain parenchymal signal is otherwise normal. Moderate diffuse atrophy. The dura is normal and there is no extra-axial collection. Vascular: Limited visualization of flow voids is normal. Skull and upper cervical spine: The visualized skull base, calvarium, upper cervical spine and extracranial soft tissues are normal. Sinuses/Orbits: No fluid levels or advanced mucosal thickening. No mastoid or middle ear effusion. Normal orbits. IMPRESSION: 1. Markedly motion degraded examination. 2. Expanded, T2 hyperintense right superior frontal gyrus. The appearance is most suggestive of a neoplastic lesion. However, characterization is again poor due to patient motion. A follow-up examination with more adequate sedation (or anesthesia) and the administration of intravenous contrast material (if not contraindicated) would likely provide better characterization. 3. No diffusion restriction or other evidence of acute ischemia. Electronically Signed   By: Kevin  Herman M.D.   On: 10/26/2016 16:12     Paula Guenther, NP-C @  11/01/2016, 9:56 AM  Pager number [phone]   

## 2016-10-28 NOTE — Progress Notes (Addendum)
PROGRESS NOTE    KINSTON MAGNAN   QJF:[id number]  DOB: 10/14/1945  DOA: 10/20/2016 PCP: [name], MD   Brief Narrative:  [name] y.o.malewith medical history significant for hypertension, chronic combined systolic/diastolic CHF, history of right renal cancer s/p transplant who presented to the ER for left arm weakness. Patient was found by his wife "hanging off the bed" at 1300 today. She tried to get him up and he slid to the floor Noted to have dried blood on tongue in ER.   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 CT head showed right frontal abnormality. MRI were attempted but he was not cooperative enough to obtain good images but MRI was suggestive of a frontal mass with vasogenic edema.  9/28- decompensated and developed acute resp failure and was transferred to SDU and started on BiPAP   Subjective: Awakens for short periods. Still confused about where he is and why. No complaints. Noted to be laying in a pool of fresh blood. See prior note.  Assessment & Plan:   Principal Problem:  Right Frontal lobe lesion - with left arm weakness and mild confusion - Neuro, ID and NS following - ID is following and performing an nfectious work up  -cont anti-epileptics per neurology - Overnight EEG and LTM EEG - "showed evidence of a structural abnormality in the right hemisphere, with epileptogenic potential and suggestion of acute-subacute injury. There is periodic pattern of sharp waves ("PLEDs"). This is indicative of a high risk of seizure occurrence, but no distinct seizures are seen." - MRI performed under anaesthesia today confirms frontal lesion to be a mass - I have spoken with Dr Malen Gauze and [facility] regarding findings and updated patient and wife - NS would like MRI with contrast- will hold off on this until GI bleed issue addressed   Active Problems:  Acute GI bleed - began passing blood  overnight - Start protonix IV- has been on decadron and chronically on Prednisone- ? Peptic ulcer - wean decadron today - no drop in Hb yet-  check frequent Hb and close BP and I and O monitoring, type and screen - noted to be tachycardic this AM-  will give fluid bolus - already NPO - GI consult- [place] - recommending to hold off on procedures as he is high risk- recommending tagged bleeding scan if patient continues to bleed-  Acute blood loss anemia and thrombocytopenia - due to acute bleed- will follow closely     History of renal transplant     Immunosuppressed status   AKI on CKD 3  Hypernatremia - Cr has steadily improved - cont D5W per nephrology for hypernatremia  - renal ultrasound unrevealing  - on Tacrolimus, Mycophenolate, prednisone (currently on Decadron), Prograf  Hypokalemia - cont to replace via IV     Pneumobilia - noted on CT imaging- on Unasyn per ID    Acute respiratory distress/ cheyne stokes breathing - temporarily needed BiPAP- CT chest without contrast showed only emphysematous changes - CXR 10/2 unrevealing   - cause uncertain- ? If the mass can cause the cheyne stokes breathing - currently on BID nebs - change to PRN - cont to follow in SDU-   HTN - Labetalol, Hydralazine   BPH - Flomax  Hiccoughs  - apparently have been on going for days -  given a one time dose of Thorazine with resolution  Nutrition: no PO intake since admission- per nurse, he  was not able to swallow anything-all meds being given through IV-  need to consider NG tube once bleeding resolves  DVT prophylaxis: SCDs Code Status: Full code Family Communication: wife Disposition Plan: cont to follow in SDU Consultants:   Neuro  Neurosurgery  ID Procedures:  EEG   2 D ECHO - Left ventricle: The cavity size was normal. Wall thickness was increased in a pattern of moderate LVH. Systolic function was normal. The estimated ejection fraction was in the range of  55% to 60%. Wall motion was normal; there were no regional wall motion abnormalities. Doppler parameters are consistent with abnormal left ventricular relaxation (grade 1 diastolic dysfunction). - Aortic valve: Mildly calcified annulus. Trileaflet; normal thickness leaflets. - Mitral valve: There was mild regurgitation. - Left atrium: The atrium was mildly dilated. Antimicrobials:  Anti-infectives    Start     Dose/Rate Route Frequency Ordered Stop   10/25/16 2200  Ampicillin-Sulbactam (UNASYN) 3 g in sodium chloride 0.9 % 100 mL IVPB     3 g 200 mL/hr over 30 Minutes Intravenous Every 6 hours 10/25/16 1501 11/09/2016 0500   10/23/16 0800  piperacillin-tazobactam (ZOSYN) IVPB 3.375 g  Status:  Discontinued     3.375 g 12.5 mL/hr over 240 Minutes Intravenous Every 8 hours 10/23/16 0735 10/25/16 1254       Objective: Vitals:   11/15/2016 0000 10/26/2016 0307 11/08/2016 0400 11/01/2016 0734  BP: (!) 121/97 (!) 145/97 (!) 152/79   Pulse:      Resp: (!) 29 (!) 33 (!) 27   Temp:  97.8 F (36.6 C)  97.6 F (36.4 C)  TempSrc:  Axillary  Oral  SpO2: 97% 100% 100%   Weight:      Height:        Intake/Output Summary (Last 24 hours) at 11/13/2016 1134 Last data filed at 11/02/2016 0734  Gross per 24 hour  Intake                0 ml  Output              775 ml  Net             -775 ml   Filed Weights   10/23/16 0500 10/24/16 0335 11/07/2016 0500  Weight: 90 kg (198 lb 6.6 oz) 93 kg (205 lb 0.4 oz) 93 kg (205 lb 0.4 oz)    Examination: General exam: comfortable- oriented only to person  HEENT: PERRLA, oral mucosa moist, no sclera icterus or thrush Respiratory system: Clear to auscultation.  Cheyne stokes breathing Cardiovascular system: S1 & S2 heard, RRR.  No murmurs - mild tachycardia Gastrointestinal system: Abdomen soft, non-tender, nondistended. Normal bowel sound. No organomegaly- fresh blood noted in between legs  Central nervous system: Alert  - left arm weakness noted-  does not follow commands  Extremities: No cyanosis, clubbing or edema Skin: No rashes or ulcers    Data Reviewed: I have personally reviewed following labs and imaging studies  CBC:  Recent Labs Lab 10/23/16 0231  10/25/16 0732 10/26/16 0144 10/26/2016 0516 11/07/2016 0213 11/17/2016 1106  WBC 8.1  < > 10.5 8.4 5.5 3.8* 5.3  NEUTROABS 7.1  --  9.5* 7.2 4.8 3.1  --   HGB 18.1*  < > 14.6 14.2 13.4 13.5 12.2*  HCT 55.5*  < > 43.6 43.9 42.4 42.5 37.9*  MCV 89.5  < > 87.6 89.2 88.9 91.2 91.1  PLT 91*  < > 107* 143* 124* 98* 90*  < > =  values in this interval not displayed. Basic Metabolic Panel:  Recent Labs Lab 10/23/16 0231  10/24/16 0728 10/25/16 0732 10/26/16 0144 11/23/2016 0516 11/17/2016 0213  NA 143  < > 147* 152* 155* 155* 156*  K 4.6  < > 3.8 3.8 3.8 3.4* 4.0  CL 112*  < > 109 115* 120* 121* 123*  CO2 13*  < > 24 24 26 24 22   GLUCOSE 164*  < > 285* 124* 221* 198* 263*  BUN 21*  < > 27* 30* 34* 30* 37*  CREATININE 2.10*  < > 1.90* 1.64* 1.71* 1.34* 1.53*  CALCIUM 9.4  < > 9.1 10.0 10.1 10.0 9.4  MG 2.1  --  2.1 2.1 2.0 1.7  --   PHOS 3.6  < > 1.8* 1.5* 2.2* 2.5 3.3  < > = values in this interval not displayed. GFR: Estimated Creatinine Clearance: 50 mL/min (A) (by C-G formula based on SCr of 1.53 mg/dL (H)). Liver Function Tests:  Recent Labs Lab 10/23/16 0231  10/24/16 0728 10/25/16 0732 10/26/16 0144 11/23/2016 0516 10/31/2016 0213  AST 187*  --  110* 145* 113* 66*  --   ALT 58  --  52 60 62 52  --   ALKPHOS 70  --  55 69 56 62  --   BILITOT 1.8*  --  1.2 0.9 0.8 0.9  --   PROT 7.8  --  6.5 7.3 6.9 6.1*  --   ALBUMIN 3.6  < > 2.9* 2.9* 2.8* 2.5* 2.5*  < > = values in this interval not displayed. No results for input(s): LIPASE, AMYLASE in the last 168 hours. No results for input(s): AMMONIA in the last 168 hours. Coagulation Profile:  Recent Labs Lab 10/13/2016 1345  INR 1.08   Cardiac Enzymes:  Recent Labs Lab 10/05/2016 1919 10/04/2016 2325  10/22/16 0638 10/23/16 1022  CKTOTAL 9,711*  --  12,724* 6,006*  CKMB  --   --   --  6.6*  TROPONINI 0.13* 0.17* 0.22* 0.12*   BNP (last 3 results) No results for input(s): PROBNP in the last 8760 hours. HbA1C: No results for input(s): HGBA1C in the last 72 hours. CBG:  Recent Labs Lab 11/17/2016 1632 10/25/2016 1912 11/01/2016 2347 11/13/2016 0310 10/25/2016 0747  GLUCAP 178* 163* 206* 252* 248*   Lipid Profile: No results for input(s): CHOL, HDL, LDLCALC, TRIG, CHOLHDL, LDLDIRECT in the last 72 hours. Thyroid Function Tests: No results for input(s): TSH, T4TOTAL, FREET4, T3FREE, THYROIDAB in the last 72 hours. Anemia Panel: No results for input(s): VITAMINB12, FOLATE, FERRITIN, TIBC, IRON, RETICCTPCT in the last 72 hours. Urine analysis:    Component Value Date/Time   COLORURINE YELLOW 10/23/2016 1112   APPEARANCEUR HAZY (A) 10/23/2016 1112   LABSPEC 1.015 10/23/2016 1112   PHURINE 5.0 10/23/2016 1112   GLUCOSEU NEGATIVE 10/23/2016 1112   HGBUR MODERATE (A) 10/23/2016 1112   HGBUR negative 04/01/2008 0933   BILIRUBINUR NEGATIVE 10/23/2016 1112   KETONESUR 20 (A) 10/23/2016 1112   PROTEINUR 30 (A) 10/23/2016 1112   UROBILINOGEN 0.2 05/14/2014 2010   NITRITE NEGATIVE 10/23/2016 1112   LEUKOCYTESUR NEGATIVE 10/23/2016 1112   Sepsis Labs: @LABRCNTIP (procalcitonin:4,lacticidven:4) ) Recent Results (from the past 240 hour(s))  MRSA PCR Screening     Status: None   Collection Time: 10/23/16  1:58 AM  Result Value Ref Range Status   MRSA by PCR NEGATIVE NEGATIVE Final    Comment:        The GeneXpert MRSA Assay (FDA approved  for NASAL specimens only), is one component of a comprehensive MRSA colonization surveillance program. It is not intended to diagnose MRSA infection nor to guide or monitor treatment for MRSA infections.   Culture, Urine     Status: Abnormal   Collection Time: 10/23/16 11:12 AM  Result Value Ref Range Status   Specimen Description URINE, RANDOM   Final   Special Requests NONE  Final   Culture MULTIPLE SPECIES PRESENT, SUGGEST RECOLLECTION (A)  Final   Report Status 10/24/2016 FINAL  Final  Culture, blood (Routine X 2) w Reflex to ID Panel     Status: None (Preliminary result)   Collection Time: 10/25/16 12:04 PM  Result Value Ref Range Status   Specimen Description BLOOD LEFT HAND  Final   Special Requests IN PEDIATRIC BOTTLE Blood Culture adequate volume  Final   Culture NO GROWTH 2 DAYS  Final   Report Status PENDING  Incomplete  Culture, blood (Routine X 2) w Reflex to ID Panel     Status: None (Preliminary result)   Collection Time: 10/25/16 12:04 PM  Result Value Ref Range Status   Specimen Description BLOOD LEFT HAND  Final   Special Requests IN PEDIATRIC BOTTLE Blood Culture adequate volume  Final   Culture NO GROWTH 2 DAYS  Final   Report Status PENDING  Incomplete         Radiology Studies: Mr Brain 86 Contrast  Result Date: 11/12/2016 CLINICAL DATA:  Altered level of consciousness. EXAM: MRI HEAD WITHOUT CONTRAST TECHNIQUE: Multiplanar, multiecho pulse sequences of the brain and surrounding structures were obtained without intravenous contrast. COMPARISON:  Motion degraded MR brain 10/26/2016. CT head 10/22/2016. MR brain 10/23/2016 FINDINGS: Brain: Redemonstrated is abnormal enlarged RIGHT frontal gyrus, T2 and FLAIR hyperintense, facilitated diffusion, without hemorrhage. No other similar lesions elsewhere. Global atrophy. Vascular: Normal flow voids. Skull and upper cervical spine: Normal marrow signal. Sinuses/Orbits: Negative. Other: None. 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 Contrast not administered because of the patient's dialysis status. Electronically Signed   By: Staci Righter M.D.   On: 11/24/2016 11:27   Mr Brain Wo Contrast  Result Date: 10/26/2016 CLINICAL DATA:  Left-sided weakness EXAM: MRI HEAD WITHOUT CONTRAST TECHNIQUE: Multiplanar, multiecho  pulse sequences of the brain and surrounding structures were obtained without intravenous contrast. COMPARISON:  Brain MRI 10/03/2016 and head CT 10/22/2016 FINDINGS: The study is degraded by motion, despite efforts to reduce this artifact, including utilization of motion-resistant MR sequences. The findings of the study are interpreted in the context of reduced sensitivity/specificity. Brain: The midline structures are normal. There is expansion of the right superior frontal gyrus with associated hyperintense T2 weighted signal. No corresponding diffusion restriction. Brain parenchymal signal is otherwise normal. Moderate diffuse atrophy. The dura is normal and there is no extra-axial collection. Vascular: Limited visualization of flow voids is normal. Skull and upper cervical spine: The visualized skull base, calvarium, upper cervical spine and extracranial soft tissues are normal. Sinuses/Orbits: No fluid levels or advanced mucosal thickening. No mastoid or middle ear effusion. Normal orbits. IMPRESSION: 1. Markedly motion degraded examination. 2. Expanded, T2 hyperintense right superior frontal gyrus. The appearance is most suggestive of a neoplastic lesion. However, characterization is again poor due to patient motion. A follow-up examination with more adequate sedation (or anesthesia) and the administration of intravenous contrast material (if not contraindicated) would likely provide better characterization. 3. No diffusion restriction or other evidence of acute ischemia. Electronically Signed   By: Lennette Bihari  [place] M.D.   On: 10/26/2016 16:12      Scheduled Meds: . cholecalciferol  2,000 Units Oral Daily  . dexamethasone  2 mg Intravenous Q6H  . hydrALAZINE  10 mg Intravenous Q6H  . Influenza vac split quadrivalent PF  0.5 mL Intramuscular Tomorrow-1000  . insulin aspart  0-9 Units Subcutaneous Q4H  . ipratropium  0.5 mg Nebulization BID  . labetalol  20 mg Intravenous Q6H  . levalbuterol  0.63 mg  Nebulization BID  . pantoprazole (PROTONIX) IV  40 mg Intravenous Q12H  . pravastatin  20 mg Oral q1800  . sodium chloride flush  3 mL Intravenous Q12H  . tacrolimus  3 mg Oral BID   Or  . tacrolimus  3 mg Sublingual BID  . tamsulosin  0.4 mg Oral QPC supper   Continuous Infusions: . dextrose 150 mL (11/09/2016 0926)  . lacosamide (VIMPAT) IV Stopped (10/25/2016 0946)  . levETIRAcetam 750 mg (10/25/2016 1036)  . mycophenolate (CELLCEPT) IV Stopped (11/05/2016 0946)  . pantoprazole (PROTONIX) IV    . valproate sodium 500 mg (11/04/2016 0943)     LOS: 7 days    Time spent in minutes: 35    Debbe Odea, MD [facility] Pager: [email or web address] Password [email or web address] 11/01/2016, 11:34 AM

## 2016-10-28 NOTE — Progress Notes (Signed)
Noted pt to have maroon colored stool.  Lamar Blinks, NP paged.  Awaiting return call.

## 2016-10-28 NOTE — Transfer of Care (Signed)
Immediate Anesthesia Transfer of Care Note  Patient: Roger Gibson  Procedure(s) Performed: MRI BRAIN (N/A )  Patient Location: PACU  Anesthesia Type:General  Level of Consciousness: lethargic and responds to stimulation  Airway & Oxygen Therapy: Patient Spontanous Breathing and Patient connected to nasal cannula oxygen  Post-op Assessment: Report given to RN and Post -op Vital signs reviewed and stable  Post vital signs: Reviewed and stable  Last Vitals:  Vitals:   11/20/2016 1200 11/11/2016 1533  BP:  (!) 156/100  Pulse:    Resp:  14  Temp: 36.7 C (P) 36.7 C  SpO2:  100%    Last Pain:  Vitals:   10/25/2016 1200  TempSrc: Oral  PainSc:          Complications: No apparent anesthesia complications

## 2016-10-28 NOTE — Progress Notes (Signed)
MRI w/o contrast reviewed with attending Dr Cyndy Freeze. The MRI is limited without contrast and it is difficult to really come up with a plan of care without fully characterizing the mass. Dr Cyndy Freeze would like MRI to be completed with contrast - based on most recent GFR and Cr it should be ok.

## 2016-10-28 NOTE — Progress Notes (Signed)
Lamar Blinks, NP returned call.  Order received for AM CBC.  To notify noc hospitalist if pt continues to have additional maroon colored stool.

## 2016-10-29 ENCOUNTER — Encounter (HOSPITAL_COMMUNITY): Admission: EM | Disposition: E | Payer: Self-pay | Source: Home / Self Care | Attending: Internal Medicine

## 2016-10-29 ENCOUNTER — Inpatient Hospital Stay (HOSPITAL_COMMUNITY): Payer: Medicare HMO | Admitting: Certified Registered Nurse Anesthetist

## 2016-10-29 ENCOUNTER — Other Ambulatory Visit (HOSPITAL_COMMUNITY): Payer: Medicare HMO

## 2016-10-29 ENCOUNTER — Encounter (HOSPITAL_COMMUNITY): Payer: Self-pay | Admitting: *Deleted

## 2016-10-29 ENCOUNTER — Inpatient Hospital Stay (HOSPITAL_COMMUNITY): Payer: Medicare HMO

## 2016-10-29 DIAGNOSIS — E870 Hyperosmolality and hypernatremia: Secondary | ICD-10-CM

## 2016-10-29 DIAGNOSIS — J9601 Acute respiratory failure with hypoxia: Secondary | ICD-10-CM

## 2016-10-29 HISTORY — PX: APPLICATION OF CRANIAL NAVIGATION: SHX6578

## 2016-10-29 HISTORY — PX: CRANIOTOMY: SHX93

## 2016-10-29 LAB — TRIGLYCERIDES: TRIGLYCERIDES: 296 mg/dL — AB (ref <150)

## 2016-10-29 LAB — BASIC METABOLIC PANEL
Anion gap: 5 (ref 5–15)
BUN: 34 mg/dL — ABNORMAL HIGH (ref 6–20)
CALCIUM: 9.2 mg/dL (ref 8.9–10.3)
CHLORIDE: 121 mmol/L — AB (ref 101–111)
CO2: 25 mmol/L (ref 22–32)
CREATININE: 1.64 mg/dL — AB (ref 0.61–1.24)
GFR calc non Af Amer: 40 mL/min — ABNORMAL LOW (ref >60)
GFR, EST AFRICAN AMERICAN: 47 mL/min — AB (ref >60)
Glucose, Bld: 190 mg/dL — ABNORMAL HIGH (ref 65–99)
Potassium: 4.1 mmol/L (ref 3.5–5.1)
SODIUM: 151 mmol/L — AB (ref 135–145)

## 2016-10-29 LAB — BLOOD GAS, ARTERIAL
Acid-base deficit: 2.3 mmol/L — ABNORMAL HIGH (ref 0.0–2.0)
BICARBONATE: 22.7 mmol/L (ref 20.0–28.0)
Drawn by: 467071
FIO2: 0.5
MECHVT: 610 mL
O2 Saturation: 98.8 %
PEEP/CPAP: 5 cmH2O
Patient temperature: 98.6
RATE: 18 resp/min
pCO2 arterial: 44 mmHg (ref 32.0–48.0)
pH, Arterial: 7.333 — ABNORMAL LOW (ref 7.350–7.450)
pO2, Arterial: 191 mmHg — ABNORMAL HIGH (ref 83.0–108.0)

## 2016-10-29 LAB — CBC
HCT: 39.6 % (ref 39.0–52.0)
Hemoglobin: 12 g/dL — ABNORMAL LOW (ref 13.0–17.0)
MCH: 28 pg (ref 26.0–34.0)
MCHC: 30.3 g/dL (ref 30.0–36.0)
MCV: 92.5 fL (ref 78.0–100.0)
PLATELETS: 83 10*3/uL — AB (ref 150–400)
RBC: 4.28 MIL/uL (ref 4.22–5.81)
RDW: 14.6 % (ref 11.5–15.5)
WBC: 3.7 10*3/uL — AB (ref 4.0–10.5)

## 2016-10-29 LAB — GLUCOSE, CAPILLARY
GLUCOSE-CAPILLARY: 184 mg/dL — AB (ref 65–99)
GLUCOSE-CAPILLARY: 129 mg/dL — AB (ref 65–99)
Glucose-Capillary: 158 mg/dL — ABNORMAL HIGH (ref 65–99)
Glucose-Capillary: 156 mg/dL — ABNORMAL HIGH (ref 65–99)
Glucose-Capillary: 132 mg/dL — ABNORMAL HIGH (ref 65–99)
Glucose-Capillary: 156 mg/dL — ABNORMAL HIGH (ref 65–99)

## 2016-10-29 SURGERY — CRANIOTOMY TUMOR EXCISION
Anesthesia: General | Site: Head | Laterality: Right

## 2016-10-29 MED ORDER — 0.9 % SODIUM CHLORIDE (POUR BTL) OPTIME
TOPICAL | Status: DC | PRN
  Administered 2016-10-29 (×3): 1000 mL

## 2016-10-29 MED ORDER — PROPOFOL 10 MG/ML IV BOLUS
INTRAVENOUS | Status: DC | PRN
  Administered 2016-10-29: 20 mg via INTRAVENOUS
  Administered 2016-10-29: 30 mg via INTRAVENOUS
  Administered 2016-10-29: 50 mg via INTRAVENOUS

## 2016-10-29 MED ORDER — PHENYLEPHRINE 40 MCG/ML (10ML) SYRINGE FOR IV PUSH (FOR BLOOD PRESSURE SUPPORT)
PREFILLED_SYRINGE | INTRAVENOUS | Status: AC
  Filled 2016-10-29: qty 10

## 2016-10-29 MED ORDER — PANTOPRAZOLE SODIUM 40 MG IV SOLR: 40.0000 mg | Freq: Every day | INTRAVENOUS | Status: DC

## 2016-10-29 MED ORDER — CHLORHEXIDINE GLUCONATE 0.12% ORAL RINSE (MEDLINE KIT)
15.0000 mL | Freq: Two times a day (BID) | OROMUCOSAL | Status: DC
  Administered 2016-10-29 – 2016-11-14 (×32): 15 mL via OROMUCOSAL

## 2016-10-29 MED ORDER — BACITRACIN ZINC 500 UNIT/GM EX OINT
TOPICAL_OINTMENT | CUTANEOUS | Status: DC | PRN
  Administered 2016-10-29 (×2): 1 via TOPICAL

## 2016-10-29 MED ORDER — FENTANYL CITRATE (PF) 100 MCG/2ML IJ SOLN
INTRAMUSCULAR | Status: DC | PRN
  Administered 2016-10-29 (×2): 50 ug via INTRAVENOUS
  Administered 2016-10-29: 100 ug via INTRAVENOUS

## 2016-10-29 MED ORDER — THROMBIN 5000 UNITS EX SOLR
CUTANEOUS | Status: AC
  Filled 2016-10-29: qty 5000

## 2016-10-29 MED ORDER — GELATIN ABSORBABLE MT POWD
OROMUCOSAL | Status: DC | PRN
  Administered 2016-10-29: 5 mL via TOPICAL

## 2016-10-29 MED ORDER — MICROFIBRILLAR COLL HEMOSTAT EX POWD
CUTANEOUS | Status: AC
  Filled 2016-10-29: qty 5

## 2016-10-29 MED ORDER — NALOXONE HCL 0.4 MG/ML IJ SOLN: 0.0800 mg | INTRAMUSCULAR | Status: DC | PRN

## 2016-10-29 MED ORDER — ROCURONIUM BROMIDE 10 MG/ML (PF) SYRINGE
PREFILLED_SYRINGE | INTRAVENOUS | Status: AC
  Filled 2016-10-29: qty 5

## 2016-10-29 MED ORDER — ACETAMINOPHEN 325 MG PO TABS: 650.0000 mg | ORAL_TABLET | ORAL | Status: DC | PRN

## 2016-10-29 MED ORDER — ONDANSETRON HCL 4 MG/2ML IJ SOLN
INTRAMUSCULAR | Status: AC
  Filled 2016-10-29: qty 2

## 2016-10-29 MED ORDER — SODIUM CHLORIDE 0.9 % IV SOLN
INTRAVENOUS | Status: DC | PRN
  Administered 2016-10-29: 13:00:00 via INTRAVENOUS

## 2016-10-29 MED ORDER — LABETALOL HCL 5 MG/ML IV SOLN
10.0000 mg | INTRAVENOUS | Status: DC | PRN
  Administered 2016-10-29: 40 mg via INTRAVENOUS
  Administered 2016-10-31 – 2016-11-01 (×2): 20 mg via INTRAVENOUS
  Administered 2016-11-01: 40 mg via INTRAVENOUS
  Administered 2016-11-01 (×4): 20 mg via INTRAVENOUS
  Administered 2016-11-02 (×2): 40 mg via INTRAVENOUS
  Administered 2016-11-06 (×3): 20 mg via INTRAVENOUS
  Administered 2016-11-09 (×2): 40 mg via INTRAVENOUS
  Administered 2016-11-09: 20 mg via INTRAVENOUS
  Administered 2016-11-11 (×4): 40 mg via INTRAVENOUS
  Administered 2016-11-12 (×2): 20 mg via INTRAVENOUS
  Filled 2016-10-29 (×5): qty 8
  Filled 2016-10-29: qty 4
  Filled 2016-10-29: qty 8
  Filled 2016-10-29: qty 4
  Filled 2016-10-29: qty 8
  Filled 2016-10-29: qty 4
  Filled 2016-10-29 (×2): qty 8
  Filled 2016-10-29: qty 4
  Filled 2016-10-29 (×2): qty 8
  Filled 2016-10-29: qty 4
  Filled 2016-10-29: qty 8
  Filled 2016-10-29: qty 4
  Filled 2016-10-29: qty 8
  Filled 2016-10-29: qty 4

## 2016-10-29 MED ORDER — ORAL CARE MOUTH RINSE
15.0000 mL | OROMUCOSAL | Status: DC
  Administered 2016-10-29 – 2016-11-12 (×134): 15 mL via OROMUCOSAL

## 2016-10-29 MED ORDER — CEFAZOLIN SODIUM-DEXTROSE 2-4 GM/100ML-% IV SOLN
INTRAVENOUS | Status: AC
  Filled 2016-10-29: qty 100

## 2016-10-29 MED ORDER — FENTANYL CITRATE (PF) 250 MCG/5ML IJ SOLN
INTRAMUSCULAR | Status: AC
Start: 2016-10-29 — End: 2016-10-29
  Filled 2016-10-29: qty 5

## 2016-10-29 MED ORDER — METOPROLOL TARTRATE 5 MG/5ML IV SOLN
5.0000 mg | Freq: Three times a day (TID) | INTRAVENOUS | Status: DC
  Administered 2016-10-29 – 2016-10-31 (×5): 5 mg via INTRAVENOUS
  Filled 2016-10-29 (×6): qty 5

## 2016-10-29 MED ORDER — LIDOCAINE 2% (20 MG/ML) 5 ML SYRINGE
INTRAMUSCULAR | Status: DC | PRN
  Administered 2016-10-29: 60 mg via INTRAVENOUS

## 2016-10-29 MED ORDER — ONDANSETRON HCL 4 MG/2ML IJ SOLN: 4.0000 mg | INTRAMUSCULAR | Status: DC | PRN

## 2016-10-29 MED ORDER — BUPIVACAINE-EPINEPHRINE (PF) 0.5% -1:200000 IJ SOLN
INTRAMUSCULAR | Status: AC
  Filled 2016-10-29: qty 30

## 2016-10-29 MED ORDER — FENTANYL CITRATE (PF) 100 MCG/2ML IJ SOLN
INTRAMUSCULAR | Status: AC
  Filled 2016-10-29: qty 2

## 2016-10-29 MED ORDER — PHENYLEPHRINE HCL 10 MG/ML IJ SOLN
INTRAVENOUS | Status: DC | PRN
  Administered 2016-10-29: 20 ug/min via INTRAVENOUS

## 2016-10-29 MED ORDER — HYDROMORPHONE HCL 1 MG/ML IJ SOLN
0.5000 mg | INTRAMUSCULAR | Status: DC | PRN
  Administered 2016-11-07: 1 mg via INTRAVENOUS
  Filled 2016-10-29: qty 1

## 2016-10-29 MED ORDER — SURGIFOAM 100 EX MISC
CUTANEOUS | Status: DC | PRN
  Administered 2016-10-29: 20 mL via TOPICAL

## 2016-10-29 MED ORDER — PROPOFOL 1000 MG/100ML IV EMUL
0.0000 ug/kg/min | INTRAVENOUS | Status: DC
  Administered 2016-10-29: 20 ug/kg/min via INTRAVENOUS
  Administered 2016-10-29: 30 ug/kg/min via INTRAVENOUS
  Administered 2016-10-30 – 2016-10-31 (×3): 15 ug/kg/min via INTRAVENOUS
  Administered 2016-11-02 – 2016-11-03 (×3): 10 ug/kg/min via INTRAVENOUS
  Administered 2016-11-03: 15 ug/kg/min via INTRAVENOUS
  Administered 2016-11-04: 5.018 ug/kg/min via INTRAVENOUS
  Administered 2016-11-04: 15 ug/kg/min via INTRAVENOUS
  Administered 2016-11-05: 10 ug/kg/min via INTRAVENOUS
  Administered 2016-11-06: 20 ug/kg/min via INTRAVENOUS
  Administered 2016-11-06: 25 ug/kg/min via INTRAVENOUS
  Administered 2016-11-07 – 2016-11-08 (×4): 20 ug/kg/min via INTRAVENOUS
  Administered 2016-11-08 – 2016-11-09 (×2): 10 ug/kg/min via INTRAVENOUS
  Administered 2016-11-09: 10.036 ug/kg/min via INTRAVENOUS
  Administered 2016-11-10 (×2): 20 ug/kg/min via INTRAVENOUS
  Filled 2016-10-29 (×23): qty 100

## 2016-10-29 MED ORDER — MICROFIBRILLAR COLL HEMOSTAT EX POWD
CUTANEOUS | Status: DC | PRN
  Administered 2016-10-29: 1 g via TOPICAL

## 2016-10-29 MED ORDER — THROMBIN 20000 UNITS EX SOLR
CUTANEOUS | Status: AC
  Filled 2016-10-29: qty 20000

## 2016-10-29 MED ORDER — BUPIVACAINE-EPINEPHRINE (PF) 0.5% -1:200000 IJ SOLN
INTRAMUSCULAR | Status: DC | PRN
  Administered 2016-10-29: 15 mL

## 2016-10-29 MED ORDER — LIDOCAINE-EPINEPHRINE 2 %-1:100000 IJ SOLN
INTRAMUSCULAR | Status: DC | PRN
  Administered 2016-10-29: 15 mL

## 2016-10-29 MED ORDER — DEXAMETHASONE SODIUM PHOSPHATE 4 MG/ML IJ SOLN: 1.0000 mg | Freq: Two times a day (BID) | INTRAMUSCULAR | Status: DC

## 2016-10-29 MED ORDER — CEFAZOLIN SODIUM-DEXTROSE 2-4 GM/100ML-% IV SOLN
2.0000 g | INTRAVENOUS | Status: AC
  Administered 2016-10-29: 2 g via INTRAVENOUS

## 2016-10-29 MED ORDER — LIDOCAINE 2% (20 MG/ML) 5 ML SYRINGE
INTRAMUSCULAR | Status: AC
  Filled 2016-10-29: qty 5

## 2016-10-29 MED ORDER — DEXAMETHASONE SODIUM PHOSPHATE 4 MG/ML IJ SOLN: 1.0000 mg | Freq: Four times a day (QID) | INTRAMUSCULAR | Status: DC

## 2016-10-29 MED ORDER — HYDROCODONE-ACETAMINOPHEN 5-325 MG PO TABS: 1.0000 | ORAL_TABLET | ORAL | Status: DC | PRN

## 2016-10-29 MED ORDER — DEXAMETHASONE SODIUM PHOSPHATE 4 MG/ML IJ SOLN
1.0000 mg | Freq: Four times a day (QID) | INTRAMUSCULAR | Status: DC
  Administered 2016-10-29 – 2016-11-01 (×11): 1 mg via INTRAVENOUS
  Filled 2016-10-29 (×11): qty 1

## 2016-10-29 MED ORDER — CEFAZOLIN SODIUM-DEXTROSE 2-4 GM/100ML-% IV SOLN
2.0000 g | Freq: Three times a day (TID) | INTRAVENOUS | Status: AC
  Administered 2016-10-29 – 2016-10-30 (×2): 2 g via INTRAVENOUS
  Filled 2016-10-29 (×2): qty 100

## 2016-10-29 MED ORDER — ROCURONIUM BROMIDE 10 MG/ML (PF) SYRINGE
PREFILLED_SYRINGE | INTRAVENOUS | Status: DC | PRN
  Administered 2016-10-29: 30 mg via INTRAVENOUS
  Administered 2016-10-29: 50 mg via INTRAVENOUS

## 2016-10-29 MED ORDER — SODIUM CHLORIDE 0.9 % IR SOLN
Status: DC | PRN
  Administered 2016-10-29: 500 mL

## 2016-10-29 MED ORDER — FENTANYL CITRATE (PF) 100 MCG/2ML IJ SOLN
50.0000 ug | Freq: Once | INTRAMUSCULAR | Status: AC
  Administered 2016-10-29: 50 ug via INTRAVENOUS

## 2016-10-29 MED ORDER — HEMOSTATIC AGENTS (NO CHARGE) OPTIME
TOPICAL | Status: DC | PRN
  Administered 2016-10-29: 1 via TOPICAL

## 2016-10-29 MED ORDER — PHENYLEPHRINE 40 MCG/ML (10ML) SYRINGE FOR IV PUSH (FOR BLOOD PRESSURE SUPPORT)
PREFILLED_SYRINGE | INTRAVENOUS | Status: DC | PRN
  Administered 2016-10-29: 80 ug via INTRAVENOUS

## 2016-10-29 MED ORDER — SODIUM CHLORIDE 0.9 % IV SOLN
0.0125 ug/kg/min | INTRAVENOUS | Status: AC
  Administered 2016-10-29: .1 ug/kg/min via INTRAVENOUS
  Filled 2016-10-29: qty 2000

## 2016-10-29 MED ORDER — BACITRACIN ZINC 500 UNIT/GM EX OINT
TOPICAL_OINTMENT | CUTANEOUS | Status: AC
  Filled 2016-10-29: qty 28.35

## 2016-10-29 MED ORDER — SODIUM CHLORIDE 0.9 % IV SOLN
INTRAVENOUS | Status: DC | PRN
  Administered 2016-10-29: 14:00:00 via INTRAVENOUS

## 2016-10-29 MED ORDER — ONDANSETRON HCL 4 MG PO TABS: 4.0000 mg | ORAL_TABLET | ORAL | Status: DC | PRN

## 2016-10-29 MED ORDER — PROPOFOL 500 MG/50ML IV EMUL
INTRAVENOUS | Status: DC | PRN
  Administered 2016-10-29: 50 ug/kg/min via INTRAVENOUS

## 2016-10-29 MED ORDER — SODIUM CHLORIDE 0.9 % IV SOLN
INTRAVENOUS | Status: DC
  Administered 2016-10-29: 12:00:00 via INTRAVENOUS

## 2016-10-29 MED ORDER — PROMETHAZINE HCL 25 MG PO TABS: 12.5000 mg | ORAL_TABLET | ORAL | Status: DC | PRN

## 2016-10-29 MED ORDER — ACETAMINOPHEN 650 MG RE SUPP: 650.0000 mg | RECTAL | Status: DC | PRN

## 2016-10-29 MED ORDER — SODIUM CHLORIDE 0.9 % IV SOLN
500.0000 mg | Freq: Two times a day (BID) | INTRAVENOUS | Status: DC
  Administered 2016-10-30 – 2016-11-06 (×15): 500 mg via INTRAVENOUS
  Filled 2016-10-29 (×16): qty 5

## 2016-10-29 MED ORDER — LIDOCAINE-EPINEPHRINE 2 %-1:100000 IJ SOLN
INTRAMUSCULAR | Status: AC
  Filled 2016-10-29: qty 1

## 2016-10-29 MED ORDER — SODIUM CHLORIDE 0.9 % IV SOLN
INTRAVENOUS | Status: DC
  Administered 2016-10-29: 17:00:00 via INTRAVENOUS

## 2016-10-29 MED ORDER — SODIUM CHLORIDE 0.9 % IV SOLN
Freq: Once | INTRAVENOUS | Status: AC
  Administered 2016-10-29: 11:00:00 via INTRAVENOUS

## 2016-10-29 SURGICAL SUPPLY — 88 items
APL SKNCLS STERI-STRIP NONHPOA (GAUZE/BANDAGES/DRESSINGS)
BENZOIN TINCTURE PRP APPL 2/3 (GAUZE/BANDAGES/DRESSINGS) IMPLANT
BLADE CLIPPER SURG (BLADE) ×3 IMPLANT
BLADE ULTRA TIP 2M (BLADE) ×2 IMPLANT
BNDG GAUZE ELAST 4 BULKY (GAUZE/BANDAGES/DRESSINGS) IMPLANT
BUR ACORN 6.0 PRECISION (BURR) ×3 IMPLANT
BUR MATCHSTICK NEURO 3.0 LAGG (BURR) IMPLANT
BUR SPIRAL ROUTER 2.3 (BUR) ×1 IMPLANT
CANISTER SUCT 3000ML PPV (MISCELLANEOUS) ×4 IMPLANT
CARTRIDGE OIL MAESTRO DRILL (MISCELLANEOUS) ×4 IMPLANT
CATH ROBINSON RED A/P 14FR (CATHETERS) IMPLANT
CHLORAPREP W/TINT 26ML (MISCELLANEOUS) ×4 IMPLANT
CLIP VESOCCLUDE MED 6/CT (CLIP) IMPLANT
DIFFUSER DRILL AIR PNEUMATIC (MISCELLANEOUS) ×5 IMPLANT
DRAIN JACKSON PRATT 10MM FLAT (MISCELLANEOUS) ×1 IMPLANT
DRAPE LAPAROTOMY 100X72 PEDS (DRAPES) ×1 IMPLANT
DRAPE NEUROLOGICAL W/INCISE (DRAPES) ×3 IMPLANT
DRAPE SHEET LG 3/4 BI-LAMINATE (DRAPES) ×6 IMPLANT
DRAPE SURG 17X23 STRL (DRAPES) IMPLANT
DRAPE WARM FLUID 44X44 (DRAPE) ×3 IMPLANT
DRSG MEPILEX BORDER 4X8 (GAUZE/BANDAGES/DRESSINGS) ×1 IMPLANT
ELECT COATED BLADE 2.86 ST (ELECTRODE) ×3 IMPLANT
ELECT REM PT RETURN 9FT ADLT (ELECTROSURGICAL) ×3
ELECTRODE REM PT RTRN 9FT ADLT (ELECTROSURGICAL) ×2 IMPLANT
EVACUATOR 1/8 PVC DRAIN (DRAIN) IMPLANT
EVACUATOR SILICONE 100CC (DRAIN) ×1 IMPLANT
FORCEPS BIPOLAR SPETZLER 8 1.0 (NEUROSURGERY SUPPLIES) ×1 IMPLANT
GAUZE SPONGE 4X4 12PLY STRL (GAUZE/BANDAGES/DRESSINGS) ×2 IMPLANT
GAUZE SPONGE 4X4 16PLY XRAY LF (GAUZE/BANDAGES/DRESSINGS) IMPLANT
GLOVE BIO SURGEON STRL SZ7 (GLOVE) IMPLANT
GLOVE BIOGEL PI IND STRL 7.0 (GLOVE) IMPLANT
GLOVE BIOGEL PI IND STRL 7.5 (GLOVE) ×2 IMPLANT
GLOVE BIOGEL PI INDICATOR 7.0 (GLOVE)
GLOVE BIOGEL PI INDICATOR 7.5 (GLOVE) ×2
GLOVE EXAM NITRILE LRG STRL (GLOVE) ×3 IMPLANT
GLOVE EXAM NITRILE XL STR (GLOVE) IMPLANT
GLOVE EXAM NITRILE XS STR PU (GLOVE) IMPLANT
GLOVE SS BIOGEL STRL SZ 7.5 (GLOVE) ×4 IMPLANT
GLOVE SUPERSENSE BIOGEL SZ 7.5 (GLOVE) ×2
GLOVE SURG SS PI 7.0 STRL IVOR (GLOVE) ×3 IMPLANT
GOWN STRL REUS W/ TWL LRG LVL3 (GOWN DISPOSABLE) ×2 IMPLANT
GOWN STRL REUS W/ TWL XL LVL3 (GOWN DISPOSABLE) IMPLANT
GOWN STRL REUS W/TWL 2XL LVL3 (GOWN DISPOSABLE) IMPLANT
GOWN STRL REUS W/TWL LRG LVL3 (GOWN DISPOSABLE) ×9
GOWN STRL REUS W/TWL XL LVL3 (GOWN DISPOSABLE)
HEMOSTAT POWDER KIT SURGIFOAM (HEMOSTASIS) ×3 IMPLANT
HEMOSTAT SURGICEL 2X14 (HEMOSTASIS) ×1 IMPLANT
KIT BASIN OR (CUSTOM PROCEDURE TRAY) ×3 IMPLANT
KIT ROOM TURNOVER OR (KITS) ×3 IMPLANT
MARKER SKIN DUAL TIP RULER LAB (MISCELLANEOUS) ×1 IMPLANT
MARKER SPHERE PSV REFLC 13MM (MARKER) ×6 IMPLANT
NDL HYPO 21X1.5 SAFETY (NEEDLE) ×4 IMPLANT
NDL HYPO 25X1 1.5 SAFETY (NEEDLE) ×2 IMPLANT
NEEDLE HYPO 21X1.5 SAFETY (NEEDLE) ×3 IMPLANT
NEEDLE HYPO 25X1 1.5 SAFETY (NEEDLE) ×3 IMPLANT
NS IRRIG 1000ML POUR BTL (IV SOLUTION) ×7 IMPLANT
OIL CARTRIDGE MAESTRO DRILL (MISCELLANEOUS) ×3
PACK CRANIOTOMY (CUSTOM PROCEDURE TRAY) ×3 IMPLANT
PATTIES SURGICAL .5 X.5 (GAUZE/BANDAGES/DRESSINGS) IMPLANT
PATTIES SURGICAL .5 X3 (DISPOSABLE) IMPLANT
PATTIES SURGICAL 1X1 (DISPOSABLE) IMPLANT
PIN MAYFIELD SKULL DISP (PIN) ×3 IMPLANT
PLATE 1.5  2HOLE LNG NEURO (Plate) ×4 IMPLANT
PLATE 1.5 2HOLE LNG NEURO (Plate) IMPLANT
SCREW SELF DRILL HT 1.5/4MM (Screw) ×8 IMPLANT
SPONGE LAP 18X18 X RAY DECT (DISPOSABLE) ×2 IMPLANT
SPONGE NEURO XRAY DETECT 1X3 (DISPOSABLE) IMPLANT
SPONGE SURGIFOAM ABS GEL 100 (HEMOSTASIS) ×3 IMPLANT
SPONGE SURGIFOAM ABS GEL 100C (HEMOSTASIS) ×2 IMPLANT
STAPLER SKIN PROX WIDE 3.9 (STAPLE) ×1 IMPLANT
STAPLER VISISTAT (STAPLE) ×1 IMPLANT
STAPLER VISISTAT 35W (STAPLE) ×3 IMPLANT
STOCKINETTE 6  STRL (DRAPES)
STOCKINETTE 6 STRL (DRAPES) ×2 IMPLANT
SUT ETHILON 3 0 FSL (SUTURE) IMPLANT
SUT ETHILON 3 0 PS 1 (SUTURE) IMPLANT
SUT NURALON 4 0 TR CR/8 (SUTURE) ×6 IMPLANT
SUT VIC AB 0 CT1 18XCR BRD8 (SUTURE) ×4 IMPLANT
SUT VIC AB 0 CT1 8-18 (SUTURE) ×6
SUT VIC AB 2-0 CT1 18 (SUTURE) ×6 IMPLANT
SYR 30ML LL (SYRINGE) ×7 IMPLANT
TOWEL GREEN STERILE (TOWEL DISPOSABLE) ×3 IMPLANT
TOWEL GREEN STERILE FF (TOWEL DISPOSABLE) ×3 IMPLANT
TRAY FOLEY W/METER SILVER 16FR (SET/KITS/TRAYS/PACK) ×2 IMPLANT
TUBE CONNECTING 12X1/4 (SUCTIONS) ×3 IMPLANT
TUBE CONNECTING 20X1/4 (TUBING) ×2 IMPLANT
UNDERPAD 30X30 (UNDERPADS AND DIAPERS) ×2 IMPLANT
WATER STERILE IRR 1000ML POUR (IV SOLUTION) ×3 IMPLANT

## 2016-10-29 NOTE — Anesthesia Procedure Notes (Signed)
Central Venous Catheter Insertion Performed by: Catalina Gravel, anesthesiologist Start/End10/27/2018 2:00 PM, 11/18/2016 2:10 PM Patient location: Pre-op. Preanesthetic checklist: patient identified, IV checked, site marked, risks and benefits discussed, surgical consent, monitors and equipment checked, pre-op evaluation, timeout performed and anesthesia consent Patient sedated Hand hygiene performed  and maximum sterile barriers used  Catheter size: 8 Fr Total catheter length 16. Central line was placed.Double lumen Procedure performed using ultrasound guided technique. Ultrasound Notes:image(s) printed for medical record Attempts: 1 Following insertion, dressing applied and line sutured. Post procedure assessment: blood return through all ports  Patient tolerated the procedure well with no immediate complications.

## 2016-10-29 NOTE — Progress Notes (Addendum)
Neurosurgery Progress Note  No issues overnight.   EXAM:  BP (!) 178/94 (BP Location: Left Arm)   Pulse 76   Temp (!) 96.8 F (36 C) (Axillary)   Resp (!) 27   Ht 5\' 10"  (1.778 m)   Wt 93 kg (205 lb 0.4 oz)   SpO2 99%   BMI 29.42 kg/m   Drowsy but easily awakened Not oriented to place, time Does not move extremities on command  PLAN MRI with contrast reviewed with attending Dr Marland Kitchen Ditty He would like to proceed with right frontal craniotomy for resection with stereotactic navigation today Unable to obtain consent from patient. Will need to touch base with wife Platelets low, will transfuse two units due to surgery

## 2016-10-29 NOTE — Care Management Note (Addendum)
Case Management Note  Patient Details  Name: Roger Gibson MRN: [id number] Date of Birth: 1945/03/11  Subjective/Objective:     Pt admitted with vasogenic edema and seizures  - on IV vimpat and depacon              Action/Plan:  PTA independent from home with wife.  CIR recommended - CSW consulted for back up plan.    Expected Discharge Date:                  Expected Discharge Plan:  Home/Self Care  In-House Referral:     Discharge planning Services  CM Consult  Post Acute Care Choice:    Choice offered to:     DME Arranged:    DME Agency:     HH Arranged:    HH Agency:     Status of Service:     If discussed at [employer] of Stay Meetings, dates discussed:    Additional Comments: Pt will have craniotomy today  Pt is now s/p MRI of brain - confirmed mass - neurology consulted.  Pt remains confused, now also with GI Bleed, having cheyne stokes breathing  and has not been able to receive nutrition.  CM requested palliative consult.  CM will continue to follow for discharge needs Maryclare Labrador, RN 11/01/2016, 8:53 AM

## 2016-10-29 NOTE — Progress Notes (Signed)
I have reviewed the MRI and agree that it is suspicious for an infectious or neoplastic process.  I think excisional biopsy is appropriate.  I have had a long discussion with the patient's wife regarding the risks, benefits, and alternatives to surgery.  She understands and wishes to proceed.

## 2016-10-29 NOTE — Brief Op Note (Signed)
10/05/2016 - 11/22/2016  3:52 PM  PATIENT:  Roger Gibson  71 y.o. male  PRE-OPERATIVE DIAGNOSIS:  Brain Mass  POST-OPERATIVE DIAGNOSIS:  Brain Mass  PROCEDURE:  Procedure(s) with comments: Right frontal craniotomy for mass resection with Brainlab (Right) - right  APPLICATION OF CRANIAL NAVIGATION (N/A)  SURGEON:  Surgeon(s) and Role:    * Ditty, Kevan Ny, MD - Primary  PHYSICIAN ASSISTANT: Ferne Reus, PA-C  ASSISTANTS: Ferne Reus, PA-C   ANESTHESIA:   general  EBL:  Total I/O In: 1851 [I.V.:1500; Blood:351] Out: 85 [Urine:200; Blood:80]  BLOOD ADMINISTERED:none  DRAINS: JP  LOCAL MEDICATIONS USED:  MARCAINE    and LIDOCAINE   SPECIMEN:  Excision  DISPOSITION OF SPECIMEN:  PATHOLOGY  COUNTS:  YES  TOURNIQUET:  * No tourniquets in log *  DICTATION: .Dragon Dictation  PLAN OF CARE: Admit to inpatient   PATIENT DISPOSITION:  ICU - intubated and hemodynamically stable.   Delay start of Pharmacological VTE agent (>24hrs) due to surgical blood loss or risk of bleeding: yes

## 2016-10-29 NOTE — Transfer of Care (Signed)
Immediate Anesthesia Transfer of Care Note  Patient: Roger Gibson  Procedure(s) Performed: Right frontal craniotomy for mass resection with Brainlab (Right Head) APPLICATION OF CRANIAL NAVIGATION (N/A )  Patient Location: ICU  Anesthesia Type:General  Level of Consciousness: sedated, unresponsive and Patient remains intubated per anesthesia plan  Airway & Oxygen Therapy: Patient remains intubated per anesthesia plan and Patient placed on Ventilator (see vital sign flow sheet for setting)  Post-op Assessment: Report given to RN and Post -op Vital signs reviewed and stable  Post vital signs: Reviewed and stable  Last Vitals:  Vitals:   11/03/2016 1230 11/12/2016 1252  BP: (!) 161/101 (!) 184/97  Pulse:  78  Resp: (!) 29 (!) 31  Temp: 36.9 C (!) 36.2 C  SpO2: 97%     Last Pain:  Vitals:   11/23/2016 1252  TempSrc: Axillary  PainSc:          Complications: No apparent anesthesia complications

## 2016-10-29 NOTE — Progress Notes (Signed)
[facility] Rounding Note  Subjective:     No further bloody stools and stable Hb Seems a little more awake today to me, but confused Anticipate craniotomy later in the day  Objective Vital signs in last 24 hours: Vitals:   11/20/2016 2345 11/18/2016 0340 11/06/2016 0724 11/18/2016 0927  BP:  (!) 161/96 (!) 178/94   Pulse:  76    Resp:  (!) 27    Temp: 98.1 F (36.7 C) 98.7 F (37.1 C) (!) 96.8 F (36 C)   TempSrc: Axillary Axillary Axillary   SpO2:  99%  93%  Weight:      Height:       Weight change:   Intake/Output Summary (Last 24 hours) at 11/20/2016 0949 Last data filed at 10/26/2016 0749  Gross per 24 hour  Intake          6958.33 ml  Output             1350 ml  Net          5608.33 ml   Physical Exam: Cheyne stokes breathing pattern much less prevalent Awake, confused but answers questions better VS as noted  Regular rhythm S1S2 No S3 Abd soft.  No obvious allograft tenderness/transplant left pelvis firm and not tender No LE edema Foley clear urine   Recent Labs Lab 10/26/16 0144 11/12/2016 0516 11/09/2016 0213 11/10/2016 0716  NA 155* 155* 156* 151*  K 3.8 3.4* 4.0 4.1  CL 120* 121* 123* 121*  CO2 26 24 22 25   GLUCOSE 221* 198* 263* 190*  BUN 34* 30* 37* 34*  CREATININE 1.71* 1.34* 1.53* 1.64*  CALCIUM 10.1 10.0 9.4 9.2  PHOS 2.2* 2.5 3.3  --     Recent Labs Lab 10/25/16 0732 10/26/16 0144 11/07/2016 0516 10/25/2016 0213  AST 145* 113* 66*  --   ALT 60 62 52  --   ALKPHOS 69 56 62  --   BILITOT 0.9 0.8 0.9  --   PROT 7.3 6.9 6.1*  --   ALBUMIN 2.9* 2.8* 2.5* 2.5*     Recent Labs Lab 10/26/16 0144 11/19/2016 0516 10/30/2016 0213 11/09/2016 1106 11/05/2016 1624 11/19/2016 2122 11/09/2016 0222  WBC 8.4 5.5 3.8* 5.3 3.8* 3.3* 3.7*  NEUTROABS 7.2 4.8 3.1  --   --   --   --   HGB 14.2 13.4 13.5 12.2* 12.9* 12.4* 12.0*  HCT 43.9 42.4 42.5 37.9* 42.7 40.0 39.6  MCV 89.2 88.9 91.2 91.1 92.8 92.2 92.5  PLT 143* 124* 98* 90* 81* 87* 83*     Recent Labs Lab  10/23/16 1022  CKTOTAL 6,006*  CKMB 6.6*  TROPONINI 0.12*    Recent Labs Lab 11/10/2016 1736 10/30/2016 2014 11/13/2016 0116 11/16/2016 0339 10/31/2016 0745  GLUCAP 245* 232* 158* 184* 156*   Studies/Results: Mr Brain Wo Contrast  Result Date: 11/07/2016 CLINICAL DATA:  Altered level of consciousness. EXAM: MRI HEAD WITHOUT CONTRAST TECHNIQUE: Multiplanar, multiecho pulse sequences of the brain and surrounding structures were obtained without intravenous contrast. COMPARISON:  Motion degraded MR brain 10/26/2016. CT head 10/22/2016. MR brain 10/03/2016 FINDINGS: Brain: Redemonstrated is abnormal enlarged RIGHT frontal gyrus, T2 and FLAIR hyperintense, facilitated diffusion, without hemorrhage. No other similar lesions elsewhere. Global atrophy. Vascular: Normal flow voids. Skull and upper cervical spine: Normal marrow signal. Sinuses/Orbits: Negative. Other: None. 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 Contrast not administered because of the patient's dialysis status. Electronically Signed   By: Roderic Ovens.D.  On: 11/07/2016 11:27   Mr Jeri Cos Contrast  Result Date: 11/07/2016 CLINICAL DATA:  71 year old male with right superior frontal gyrus expansile T2 and FLAIR hyperintense lesion. Postcontrast imaging requested for further characterization. The patient is status post nephrectomy for renal cell carcinoma, subsequently on dialysis, subsequent renal transplant. 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 Immunocompromised. EXAM: MRI HEAD WITH CONTRAST TECHNIQUE: Multiplanar, multiecho pulse sequences of the brain and surrounding structures were obtained with intravenous contrast. CONTRAST:  86mL MULTIHANCE GADOBENATE DIMEGLUMINE 529 MG/ML IV SOLN COMPARISON:  Noncontrast MRI 10/25/2016, and earlier. FINDINGS: Following contrast  administration there is faint and irregular, nodular enhancement at the T2/FLAIR hyperintense lesion demonstrated yesterday. Two main such foci of enhancement each measure 4-5 mm (series 7, image 18) and appear located along the superficial and medial cortical surfaces of the lesion. See also series 4, image 144 and series 6, image 23. The lesion is in the right superior frontal gyrus about 12-14 mm off of midline, in the region of the supplemental motor area (SMA). The nearby superior sagittal sinus appears to be enhancing and patent. Lateral to the sinus there appears to be a small arachnoid granulation in proximity to the lesion (small arrow on series 7, image 18). The other regional main cortical draining veins appear to be enhancing and patent. There is no pachymeningeal or defined leptomeningeal enhancement. No other abnormal intracranial enhancement. IMPRESSION: The right superior frontal gyrus, T2/FLAIR hyperintense lesion in the region of the right supplemental motor area (SMA) demonstrates faint nodular and cortical enhancement following contrast. The regional venous structures including the superior sagittal sinus appear patent. I note that the lesion is nearly subjacent to a small arachnoid granulation. No associated pachymeningeal or convincing leptomeningeal thickening or enhancement. No other abnormal intracranial enhancement. The enhancement pattern is not typical of either metastatic disease or a high-grade glioma. And I feel the main differential considerations in this clinical setting are a focal infectious or inflammatory encephalitis versus a grade 2 or grade 3 glioma. Electronically Signed   By: Genevie Ann M.D.   On: 11/19/2016 15:36   Medications: Infusions: . sodium chloride    . dextrose 1,000 mL (10/26/2016 2210)  . lacosamide (VIMPAT) IV Stopped (10/26/2016 2252)  . levETIRAcetam Stopped (11/10/2016 2209)  . mycophenolate (CELLCEPT) IV Stopped (11/02/2016 0942)  . valproate sodium Stopped  (11/24/2016 8413)    Scheduled Medications: . cholecalciferol  2,000 Units Oral Daily  . dexamethasone  2 mg Intravenous Q6H  . Influenza vac split quadrivalent PF  0.5 mL Intramuscular Tomorrow-1000  . insulin aspart  0-9 Units Subcutaneous Q4H  . labetalol  20 mg Intravenous Q6H  . levalbuterol  0.63 mg Nebulization BID  . pantoprazole (PROTONIX) IV  40 mg Intravenous Q12H  . pravastatin  20 mg Oral q1800  . sodium chloride flush  3 mL Intravenous Q12H  . tacrolimus  3 mg Oral BID   Or  . tacrolimus  1.5 mg Sublingual BID  . 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 Baseline creatinine between 1.5 and 1.7. Pred/prograf/myfortic. 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 Presented to ED with left-sided weakness, admitted with sz- brain imaging possible/probable  frontal lobe brain mass  1. Renal xpl #2 - BL creatinine 1.5-1.7. AKI on CKD (creatinine 2.29 on admission) on setting of hospitalization for possibly stroke/seizure/possible brain tumor with decreased mental status.  1. Creatinine back to baseline.  2. Pharmacy assisting  to get his antirejection medications changed to alternate form.  3. TAC trough ^ 10.3 on 9/30 (did not come back until 10/2) 4. Reduced prograf dosing to 3 mg BID 10/2, and 1.5 mg 10/4 (SL);  5. SL TAC, IV mycophenolate; PO prednisone on hold while receiving IV decadron 6. Plan to recheck level TAC 70/3.  2. Metabolic acidosis - resolved 3. Hypokalemia - replete prn 4. Hypernatremia - ^150/hour 10/4 - improving 1. Ideally will need small feeding tube for nutrition and free water administration 5. Frontal lobe lesion/left sided weakness/lethargy - anticipate craniotomy today for dx of brain  mass 6. Systolic and diastolic HF - + fluid balance 2/2 bicarb drip. (drip stopped, lasix not resumed). I think volume OK at this time. 7. HTN -  BP remains elevated. Has IV hydralazine ordered, mildly tachycardic maybe on that basis (tachy all this week).  1. Uses labetolol at home.  2. Added standing IV labetolol rather than prn - BP still up but clearly less tachycardia   Jamal Maes, MD [facility] [phone] Pager 11/07/2016, 9:49 AM

## 2016-10-29 NOTE — Progress Notes (Signed)
Pt in surgery and not available for EEG

## 2016-10-29 NOTE — Progress Notes (Signed)
Pt refused to wear Bipap. RT will continue to monitor.

## 2016-10-29 NOTE — Progress Notes (Signed)
Initial Nutrition Assessment  DOCUMENTATION CODES:   Not applicable  INTERVENTION:   -Recommend:  Initiate TF via cortrak tube with Vital 1.2 at goal rate of 80 ml/h (1920 ml per day) to provide 2304 kcals, 144 gm protein, 1467 ml free water daily.  NUTRITION DIAGNOSIS:   Inadequate oral intake related to inability to eat, lethargy/confusion as evidenced by NPO status.  GOAL:   Patient will meet greater than or equal to 90% of their needs  MONITOR:   Diet advancement, Labs, Weight trends, Skin, I & O's  REASON FOR ASSESSMENT:   NPO/Clear Liquid Diet    ASSESSMENT:   Roger Gibson 71 y.o. male with medical history significant for hypertension, chronic combined systolic/diastolic CHF, history of right renal cancer s/p transplant who presented to the ER for left arm weakness. Patient was found by his wife "hanging off the bed" at 1300 today. She tried to get him up and he slid to the floor Noted to have dried blood on tongue in ER.    Pt admitted with vasogenic brain edema with lt sided weakness.   Pt and family members not present at time of visit, as pt undergoing rt frontal craniotomy for mass resection. Per discussion with RN, plan to biopsy brain mass (suspected brain infection) for pathology review. RN reports potential need for cortrak tube, as pt has not received nutrition since 10/22/16. BSE on 11/07/2016 recommended alternative means of nutrition. Plan to transfer to ICU after OR.   Spoke with cortrak tube placement team, who confirmed order for cortrak tube placement, however, unable to place today, due to pt in OR. Will plan to place on 10/30/16.   Unable to complete Nutrition-Focused physical exam at this time.   Labs reviewed: Na: 151, CBGS: 154-184 (inpatient orders for glycemic control are 0-9 units insulin aspart every 4 hours).  Diet Order:  Diet NPO time specified Diet NPO time specified  Skin:  Reviewed, no issues  Last BM:  11/21/2016  Height:   Ht  Readings from Last 1 Encounters:  10/26/2016 5\' 10"  (1.778 m)    Weight:   Wt Readings from Last 1 Encounters:  11/09/2016 205 lb (93 kg)    Ideal Body Weight:  75.5 kg  BMI:  Body mass index is 29.41 kg/m.  Estimated Nutritional Needs:   Kcal:  2200-2400  Protein:  125-150 grams  Fluid:  >2.2 L  EDUCATION NEEDS:   Education needs no appropriate at this time  Margaret Staggs A. Jimmye Norman, RD, LDN, CDE Pager: [phone] After hours Pager: [phone]

## 2016-10-29 NOTE — Plan of Care (Signed)
Problem: Health Behavior/Discharge Planning: Goal: Ability to manage health-related needs will improve Outcome: Not Progressing Family has awareness patient unable to process at this time  Problem: Nutrition: Goal: Adequate nutrition will be maintained Outcome: Not Progressing NPO, receiving IV fluids, dextrose. TF versus TPN  Problem: Bowel/Gastric: Goal: Will not experience complications related to bowel motility Outcome: Not Progressing Having some GI bleeding

## 2016-10-29 NOTE — Progress Notes (Deleted)
Pt seen and examined. No issues overnight.  Some eye opening this morning.  EXAM: Temp:  [96.8 F (36 C)-98.9 F (37.2 C)] (P) 98.4 F (36.9 C) (10/05 1230) Pulse Rate:  [74-81] 74 (10/05 1133) Resp:  [14-34] (P) 29 (10/05 1230) BP: (155-192)/(81-107) (P) 161/101 (10/05 1230) SpO2:  [93 %-100 %] (P) 97 % (10/05 1230) Weight:  [93 kg (205 lb)] 93 kg (205 lb) (10/05 1216) Intake/Output      10/04 0701 - 10/05 0700 10/05 0701 - 10/06 0700   I.V. (mL/kg) 6385.8 (68.7) 500 (5.4)   Blood  351   IV Piggyback 1055    Total Intake(mL/kg) 7440.8 (80) 851 (9.2)   Urine (mL/kg/hr) 1350 (0.6) 100 (0.2)   Blood 0    Total Output 1350 100   Net +6090.8 +751        Stool Occurrence 1 x     Intubated PERRL Withdraws right upper and lower extremity to painful stimulus  Minimally improved from yesterday Continue current care Reduce steroids

## 2016-10-29 NOTE — Progress Notes (Signed)
PROGRESS NOTE    Roger Gibson   ZYS:[id number]  DOB: 04-Jul-1945  DOA: 10/10/2016 PCP: [name], MD   Brief Narrative:  Roger Gibson y.o.malewith medical history significant for hypertension, chronic combined systolic/diastolic CHF, history of right renal cancer s/p transplant who presented to the ER for left arm weakness. Patient was found by his wife "hanging off the bed" at 1300 today. She tried to get him up and he slid to the floor Noted to have dried blood on tongue in ER.   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 CT head showed right frontal abnormality. MRI were attempted but he was not cooperative enough to obtain good images but MRI was suggestive of a frontal mass with vasogenic edema.  9/28- decompensated and developed acute resp failure and was transferred to SDU and started on BiPAP   Subjective: Confused to situation/ place/ time. No complaints.    Assessment & Plan:   Principal Problem:  Right Frontal lobe lesion - with left arm weakness and mild confusion - Neuro, ID and NS following - ID is following and performing an infectious work up  -cont anti-epileptics per neurology via IV - Overnight EEG and LTM EEG - "showed evidence of a structural abnormality in the right hemisphere, with epileptogenic potential and suggestion of acute-subacute injury. There is periodic pattern of sharp waves ("PLEDs"). This is indicative of a high risk of seizure occurrence, but no distinct seizures are seen." - MRI performed under anaesthesia today confirms frontal lesion to be a mass - I have spoken with Dr Malen Gauze and [facility] regarding findings and updated patient and wife - NS taken patient to OR today for biopsy- ok to stop Decadron per NS- will wean and then switch to oral Prednisone once able to decide on nutrition   Active Problems:  Acute GI bleed - began passing blood 10/3 night- bright red -  Start protonix IV- has been on decadron and chronically on Prednisone- ? Peptic ulcer - OK to stop Decadron per NS- will cut back and then switch to oral Prednisone once able to decide on nutrition - already NPO - GI consult- [place] - recommending to hold off on procedures as he is high risk- recommending tagged bleeding scan if patient continues to bleed-- last episode of bloody stool on AM of 10/4- no drop in HB since yesterday  Acute blood loss anemia and thrombocytopenia - due to acute bleed- Hb dropped from 14 to 12 and stable since     History of renal transplant     Immunosuppressed status   AKI on CKD 3  Hypernatremia - Cr has steadily improving - cont D5W per nephrology for hypernatremia  - renal ultrasound unrevealing  - on Tacrolimus, Mycophenolate, prednisone (currently on Decadron), Prograf  Hypokalemia - stable today     Pneumobilia - noted on CT imaging- treated from 10/1- 10/4 by ID with Zosyn/ Unasyn    Acute respiratory distress/ cheyne stokes breathing - temporarily needed BiPAP- CT chest without contrast showed only emphysematous changes - CXR 10/2 unrevealing   - currently on BID nebs changed to PRN (does not use them at home) - cont to follow in SDU-  - cause uncertain- ? If the mass can cause the cheyne stokes breathing- breathing pattern has resolved when I saw him this AM  HTN - Lopressor Hydralazine   BPH - Flomax  Hiccoughs  - apparently have been on going for  days -  given a one time dose of Thorazine with resolution  Right hand swelling - appears to be due to an IV infiltrating- follow  Nutrition: no PO intake since admission- per nurse, he was not able to swallow anything-all meds being given through IV-  Placing cortrac today after OR until more alert and able to eat adequately- d/w GI and Dr Fuller Plan is OK with this   DVT prophylaxis: SCDs Code Status: Full code Family Communication: wife Disposition Plan: cont to follow in  SDU Consultants:   Neuro  Neurosurgery  ID Procedures:  EEG   2 D ECHO - Left ventricle: The cavity size was normal. Wall thickness was increased in a pattern of moderate LVH. Systolic function was normal. The estimated ejection fraction was in the range of 55% to 60%. Wall motion was normal; there were no regional wall motion abnormalities. Doppler parameters are consistent with abnormal left ventricular relaxation (grade 1 diastolic dysfunction). - Aortic valve: Mildly calcified annulus. Trileaflet; normal thickness leaflets. - Mitral valve: There was mild regurgitation. - Left atrium: The atrium was mildly dilated. Antimicrobials:  Anti-infectives    Start     Dose/Rate Route Frequency Ordered Stop   11/03/2016 1209  ceFAZolin (ANCEF) 2-4 GM/100ML-% IVPB    Comments:  Grace Blight   : cabinet override      11/20/2016 1209 10/30/16 0014   11/19/2016 1207  ceFAZolin (ANCEF) IVPB 2g/100 mL premix     2 g 200 mL/hr over 30 Minutes Intravenous 30 min pre-op 11/09/2016 1207     10/25/16 2200  Ampicillin-Sulbactam (UNASYN) 3 g in sodium chloride 0.9 % 100 mL IVPB     3 g 200 mL/hr over 30 Minutes Intravenous Every 6 hours 10/25/16 1501 11/05/2016 0500   10/23/16 0800  piperacillin-tazobactam (ZOSYN) IVPB 3.375 g  Status:  Discontinued     3.375 g 12.5 mL/hr over 240 Minutes Intravenous Every 8 hours 10/23/16 0735 10/25/16 1254       Objective: Vitals:   11/16/2016 1100 11/08/2016 1111 10/30/2016 1133 11/16/2016 1216  BP: (!) 192/107 (!) 166/89 (!) 164/81   Pulse:  76 74   Resp: (!) 33 (!) 34 19   Temp: 98.9 F (37.2 C) 98.9 F (37.2 C) 98.7 F (37.1 C)   TempSrc: Axillary Axillary Axillary   SpO2: 97% 98% 97%   Weight:    93 kg (205 lb)  Height:    5\' 10"  (1.778 m)    Intake/Output Summary (Last 24 hours) at 11/17/2016 1234 Last data filed at 11/16/2016 1225  Gross per 24 hour  Intake           2728.5 ml  Output             1350 ml  Net           1378.5 ml    Filed Weights   10/24/16 0335 11/19/2016 0500 10/31/2016 1216  Weight: 93 kg (205 lb 0.4 oz) 93 kg (205 lb 0.4 oz) 93 kg (205 lb)    Examination: General exam: comfortable- oriented only to person - still falls asleep easily HEENT: PERRLA, oral mucosa moist, no sclera icterus or thrush Respiratory system: Clear to auscultation. Pulse ox 100% on room air- checked by myslelf Cardiovascular system: S1 & S2 heard, RRR.  No murmurs - mild tachycardia Gastrointestinal system: Abdomen soft, non-tender, nondistended. Normal bowel sound. No organomegaly  Central nervous system: Alert  - left arm weakness noted- does not follow commands  Extremities: No  cyanosis, clubbing - right hand swollen Skin: No rashes or ulcers    Data Reviewed: I have personally reviewed following labs and imaging studies  CBC:  Recent Labs Lab 10/23/16 0231  10/25/16 0732 10/26/16 0144 11/20/2016 0516 11/17/2016 0213 11/11/2016 1106 11/15/2016 1624 10/26/2016 2122 11/10/2016 0222  WBC 8.1  < > 10.5 8.4 5.5 3.8* 5.3 3.8* 3.3* 3.7*  NEUTROABS 7.1  --  9.5* 7.2 4.8 3.1  --   --   --   --   HGB 18.1*  < > 14.6 14.2 13.4 13.5 12.2* 12.9* 12.4* 12.0*  HCT 55.5*  < > 43.6 43.9 42.4 42.5 37.9* 42.7 40.0 39.6  MCV 89.5  < > 87.6 89.2 88.9 91.2 91.1 92.8 92.2 92.5  PLT 91*  < > 107* 143* 124* 98* 90* 81* 87* 83*  < > = values in this interval not displayed. Basic Metabolic Panel:  Recent Labs Lab 10/23/16 0231  10/24/16 7622 10/25/16 0732 10/26/16 0144 10/31/2016 0516 10/30/2016 0213 11/07/2016 0716  NA 143  < > 147* 152* 155* 155* 156* 151*  K 4.6  < > 3.8 3.8 3.8 3.4* 4.0 4.1  CL 112*  < > 109 115* 120* 121* 123* 121*  CO2 13*  < > 24 24 26 24 22 25   GLUCOSE 164*  < > 285* 124* 221* 198* 263* 190*  BUN 21*  < > 27* 30* 34* 30* 37* 34*  CREATININE 2.10*  < > 1.90* 1.64* 1.71* 1.34* 1.53* 1.64*  CALCIUM 9.4  < > 9.1 10.0 10.1 10.0 9.4 9.2  MG 2.1  --  2.1 2.1 2.0 1.7  --   --   PHOS 3.6  < > 1.8* 1.5* 2.2* 2.5 3.3  --    < > = values in this interval not displayed. GFR: Estimated Creatinine Clearance: 46.6 mL/min (A) (by C-G formula based on SCr of 1.64 mg/dL (H)). Liver Function Tests:  Recent Labs Lab 10/23/16 0231  10/24/16 0728 10/25/16 0732 10/26/16 0144 11/11/2016 0516 10/26/2016 0213  AST 187*  --  110* 145* 113* 66*  --   ALT 58  --  52 60 62 52  --   ALKPHOS 70  --  55 69 56 62  --   BILITOT 1.8*  --  1.2 0.9 0.8 0.9  --   PROT 7.8  --  6.5 7.3 6.9 6.1*  --   ALBUMIN 3.6  < > 2.9* 2.9* 2.8* 2.5* 2.5*  < > = values in this interval not displayed. No results for input(s): LIPASE, AMYLASE in the last 168 hours. No results for input(s): AMMONIA in the last 168 hours. Coagulation Profile: No results for input(s): INR, PROTIME in the last 168 hours. Cardiac Enzymes:  Recent Labs Lab 10/23/16 1022  CKTOTAL 6,006*  CKMB 6.6*  TROPONINI 0.12*   BNP (last 3 results) No results for input(s): PROBNP in the last 8760 hours. HbA1C: No results for input(s): HGBA1C in the last 72 hours. CBG:  Recent Labs Lab 11/13/2016 2014 11/04/2016 0116 11/17/2016 0339 11/16/2016 0745 11/02/2016 1224  GLUCAP 232* 158* 184* 156* 132*   Lipid Profile: No results for input(s): CHOL, HDL, LDLCALC, TRIG, CHOLHDL, LDLDIRECT in the last 72 hours. Thyroid Function Tests: No results for input(s): TSH, T4TOTAL, FREET4, T3FREE, THYROIDAB in the last 72 hours. Anemia Panel: No results for input(s): VITAMINB12, FOLATE, FERRITIN, TIBC, IRON, RETICCTPCT in the last 72 hours. Urine analysis:    Component Value Date/Time   COLORURINE YELLOW 10/23/2016  [place] (A) 10/23/2016 1112   LABSPEC 1.015 10/23/2016 1112   PHURINE 5.0 10/23/2016 1112   GLUCOSEU NEGATIVE 10/23/2016 1112   HGBUR MODERATE (A) 10/23/2016 1112   HGBUR negative 04/01/2008 0933   BILIRUBINUR NEGATIVE 10/23/2016 1112   KETONESUR 20 (A) 10/23/2016 1112   PROTEINUR 30 (A) 10/23/2016 1112   UROBILINOGEN 0.2 05/14/2014 2010   NITRITE  NEGATIVE 10/23/2016 1112   LEUKOCYTESUR NEGATIVE 10/23/2016 1112   Sepsis Labs: @LABRCNTIP (procalcitonin:4,lacticidven:4) ) Recent Results (from the past 240 hour(s))  MRSA PCR Screening     Status: None   Collection Time: 10/23/16  1:58 AM  Result Value Ref Range Status   MRSA by PCR NEGATIVE NEGATIVE Final    Comment:        The GeneXpert MRSA Assay (FDA approved for NASAL specimens only), is one component of a comprehensive MRSA colonization surveillance program. It is not intended to diagnose MRSA infection nor to guide or monitor treatment for MRSA infections.   Culture, Urine     Status: Abnormal   Collection Time: 10/23/16 11:12 AM  Result Value Ref Range Status   Specimen Description URINE, RANDOM  Final   Special Requests NONE  Final   Culture MULTIPLE SPECIES PRESENT, SUGGEST RECOLLECTION (A)  Final   Report Status 10/24/2016 FINAL  Final  Culture, blood (Routine X 2) w Reflex to ID Panel     Status: None (Preliminary result)   Collection Time: 10/25/16 12:04 PM  Result Value Ref Range Status   Specimen Description BLOOD LEFT HAND  Final   Special Requests IN PEDIATRIC BOTTLE Blood Culture adequate volume  Final   Culture NO GROWTH 3 DAYS  Final   Report Status PENDING  Incomplete  Culture, blood (Routine X 2) w Reflex to ID Panel     Status: None (Preliminary result)   Collection Time: 10/25/16 12:04 PM  Result Value Ref Range Status   Specimen Description BLOOD LEFT HAND  Final   Special Requests IN PEDIATRIC BOTTLE Blood Culture adequate volume  Final   Culture NO GROWTH 3 DAYS  Final   Report Status PENDING  Incomplete         Radiology Studies: Mr Jeri Cos Contrast  Result Date: 10/26/2016 CLINICAL DATA:  71 year old male with right superior frontal gyrus expansile T2 and FLAIR hyperintense lesion. Postcontrast imaging requested for further characterization. The patient is status post nephrectomy for renal cell carcinoma, subsequently on dialysis,  subsequent renal transplant. 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 Immunocompromised. EXAM: MRI HEAD WITH CONTRAST TECHNIQUE: Multiplanar, multiecho pulse sequences of the brain and surrounding structures were obtained with intravenous contrast. CONTRAST:  57mL MULTIHANCE GADOBENATE DIMEGLUMINE 529 MG/ML IV SOLN COMPARISON:  Noncontrast MRI 11/09/2016, and earlier. FINDINGS: Following contrast administration there is faint and irregular, nodular enhancement at the T2/FLAIR hyperintense lesion demonstrated yesterday. Two main such foci of enhancement each measure 4-5 mm (series 7, image 18) and appear located along the superficial and medial cortical surfaces of the lesion. See also series 4, image 144 and series 6, image 23. The lesion is in the right superior frontal gyrus about 12-14 mm off of midline, in the region of the supplemental motor area (SMA). The nearby superior sagittal sinus appears to be enhancing and patent. Lateral to the sinus there appears to be a small arachnoid granulation in proximity to the lesion (small arrow on  series 7, image 18). The other regional main cortical draining veins appear to be enhancing and patent. There is no pachymeningeal or defined leptomeningeal enhancement. No other abnormal intracranial enhancement. IMPRESSION: The right superior frontal gyrus, T2/FLAIR hyperintense lesion in the region of the right supplemental motor area (SMA) demonstrates faint nodular and cortical enhancement following contrast. The regional venous structures including the superior sagittal sinus appear patent. I note that the lesion is nearly subjacent to a small arachnoid granulation. No associated pachymeningeal or convincing leptomeningeal thickening or enhancement. No other abnormal intracranial enhancement. The enhancement pattern is not typical of either metastatic disease or a  high-grade glioma. And I feel the main differential considerations in this clinical setting are a focal infectious or inflammatory encephalitis versus a grade 2 or grade 3 glioma. Electronically Signed   By: Genevie Ann M.D.   On: 11/04/2016 15:36      Scheduled Meds: . [MAR Hold] cholecalciferol  2,000 Units Oral Daily  . [MAR Hold] dexamethasone  2 mg Intravenous Q6H  . [MAR Hold] Influenza vac split quadrivalent PF  0.5 mL Intramuscular Tomorrow-1000  . [MAR Hold] insulin aspart  0-9 Units Subcutaneous Q4H  . [MAR Hold] labetalol  20 mg Intravenous Q6H  . [MAR Hold] levalbuterol  0.63 mg Nebulization BID  . [MAR Hold] pantoprazole (PROTONIX) IV  40 mg Intravenous Q12H  . [MAR Hold] pravastatin  20 mg Oral q1800  . [MAR Hold] sodium chloride flush  3 mL Intravenous Q12H  . [MAR Hold] tacrolimus  3 mg Oral BID   Or  . [MAR Hold] tacrolimus  1.5 mg Sublingual BID  . [MAR Hold] tamsulosin  0.4 mg Oral QPC supper   Continuous Infusions: . sodium chloride 10 mL/hr at 11/24/2016 1226  . ceFAZolin    .  ceFAZolin (ANCEF) IV    . dextrose 1,000 mL (11/12/2016 2210)  . [MAR Hold] lacosamide (VIMPAT) IV Stopped (11/11/2016 1050)  . [MAR Hold] levETIRAcetam Stopped (11/24/2016 1018)  . [MAR Hold] mycophenolate (CELLCEPT) IV Stopped (11/15/2016 0942)  . remifentanil (ULTIVA) 2 mg in 100 mL normal saline (20 mcg/mL) Optime    . [MAR Hold] valproate sodium Stopped (11/21/2016 0627)     LOS: 8 days    Time spent in minutes: 35    Debbe Odea, MD [facility] Pager: [email or web address] Password [email or web address] 11/05/2016, 12:34 PM

## 2016-10-29 NOTE — Progress Notes (Signed)
Dr. Cyndy Freeze aware pt not withdrawing to painful stimuli, hypertensive when propofol off. Dr. Cyndy Freeze states pt had similar  poor exam prior to surgery. RN will continue to monitor.

## 2016-10-29 NOTE — Consult Note (Signed)
PULMONARY / CRITICAL CARE MEDICINE   Name: Roger Gibson MRN: [id number] DOB: [birth year]    ADMISSION DATE:  10/04/2016 CONSULTATION DATE: 11/05/2016    CHIEF COMPLAINT:  71 year old with a history of renal cancer who presented with left-sided weakness, probably a seizure,  HISTORY OF PRESENT ILLNESS:   This 71 year old is chronically immunosuppressed due to renal transplantation. He also has a prior history of renal cancer. He presented with left-sided weakness and probably postictal state was found to have a right frontal mass which was biopsied today.  PAST MEDICAL HISTORY :  He  has a past medical history of Acute focal neurological deficit (09/2016); Allergy; Blood transfusion without reported diagnosis; Cancer ([place]); Confusion; ESRD (end stage renal disease) ([place]); ESRD (end stage renal disease) on dialysis ([place]); Gout (01/2002); HLD (hyperlipidemia) (1999); HTN (hypertension) (1974); Iron deficiency anemia; and Renal cysts, acquired, bilateral.  PAST SURGICAL HISTORY: He  has a past surgical history that includes Nephrectomy (1965); Achilles tendon repair (1992); MR MRA ABDOMEN (2000); Nephrectomy (2000); Colonoscopy (05/15/03); CT ABD W & PELVIS WO CM (03/03/06); Cystoscopy (03/03/06); Kidney transplant (06/20/06); Colonoscopy (07/18/08); Radiology with anesthesia (N/A, 11/15/2016); and Radiology with anesthesia (N/A, 11/13/2016).  Allergies  Allergen Reactions  . Iodinated Diagnostic Agents Hives and Swelling  . Iodine Hives and Swelling  . Tape Rash    Adhesive tape     No current facility-administered medications on file prior to encounter.    Current Outpatient Prescriptions on File Prior to Encounter  Medication Sig  . furosemide (LASIX) 20 MG tablet Take 10 mg by mouth daily.   Marland Kitchen labetalol (NORMODYNE) 100 MG tablet Take 400 mg by mouth 2 (two) times daily.   . Multiple Vitamin (MULTIVITAMIN WITH MINERALS) TABS tablet Take 1 tablet by mouth daily.  . mycophenolate (MYFORTIC)  180 MG EC tablet Take 180 mg by mouth 2 (two) times daily.   . predniSONE (DELTASONE) 5 MG tablet Take 5 mg by mouth daily with breakfast.   . tacrolimus (PROGRAF) 1 MG capsule Take 4 mg by mouth 2 (two) times daily.   . tamsulosin (FLOMAX) 0.4 MG CAPS capsule Take 0.4 mg by mouth daily after supper.  Marland Kitchen omeprazole (PRILOSEC) 20 MG capsule Take 20 mg by mouth daily.  . sertraline (ZOLOFT) 50 MG tablet Take 50 mg by mouth daily.    FAMILY HISTORY:  His indicated that his mother is deceased. He indicated that his father is deceased. He indicated that two of his eight sisters are alive. He indicated that three of his six brothers are alive. He indicated that the status of his paternal grandfather is unknown. He indicated that the status of his neg hx is unknown.    SOCIAL HISTORY: He  reports that he quit smoking about 18 years ago. His smoking use included Cigarettes and Cigars. He has never used smokeless tobacco. He reports that he does not drink alcohol or use drugs.  REVIEW OF SYSTEMS:   Not obtainable  SUBJECTIVE:  Not obtainable  VITAL SIGNS: BP (!) 147/76   Pulse 61   Temp 97.9 F (36.6 C) (Oral)   Resp 18   Ht 5\' 10"  (1.778 m)   Wt 205 lb (93 kg)   SpO2 (!) 89%   BMI 29.41 kg/m   HEMODYNAMICS:    VENTILATOR SETTINGS: Vent Mode: PRVC FiO2 (%):  [50 %] 50 % Set Rate:  [18 bmp] 18 bmp Vt Set:  [590 mL] 590 mL PEEP:  [5 cmH20] 5 cmH20 Plateau Pressure:  [  16 cmH20] 16 cmH20  INTAKE / OUTPUT: I/O last 3 completed shifts: In: 7440.8 [I.V.:6385.8; IV Piggyback:1055] Out: 6073 [Urine:1750]  PHYSICAL EXAMINATION: General:  Currently ill-appearing elderly male who is intubated and mechanically ventilated Neuro:  No response to voice not withdrawing any limb to noxious stimuli. He is immediately postop pupils are equal and reactive he has bilateral arcus senilis Cardiovascular: S1 and S2 are regular without murmur rub or gallop. There is no dependent edema Lungs:  He is  orally intubated respirations are unlabored, there is symmetric air movement, no wheezes. Abdomen:  The abdomen is somewhat distended and tympanic there is no organomegaly obvious masses or tenderness  LABS:  BMET  Recent Labs Lab 11/05/2016 0516 11/15/2016 0213 10/26/2016 0716  NA 155* 156* 151*  K 3.4* 4.0 4.1  CL 121* 123* 121*  CO2 24 22 25   BUN 30* 37* 34*  CREATININE 1.34* 1.53* 1.64*  GLUCOSE 198* 263* 190*    Electrolytes  Recent Labs Lab 10/25/16 0732 10/26/16 0144 11/19/2016 0516 11/15/2016 0213 11/03/2016 0716  CALCIUM 10.0 10.1 10.0 9.4 9.2  MG 2.1 2.0 1.7  --   --   PHOS 1.5* 2.2* 2.5 3.3  --     CBC  Recent Labs Lab 11/05/2016 1624 10/30/2016 2122 10/26/2016 0222  WBC 3.8* 3.3* 3.7*  HGB 12.9* 12.4* 12.0*  HCT 42.7 40.0 39.6  PLT 81* 87* 83*    Coag's No results for input(s): APTT, INR in the last 168 hours.  Sepsis Markers No results for input(s): LATICACIDVEN, PROCALCITON, O2SATVEN in the last 168 hours.  ABG  Recent Labs Lab 10/23/16 0020  PHART 7.379  PCO2ART 22.8*  PO2ART 88.5    Liver Enzymes  Recent Labs Lab 10/25/16 0732 10/26/16 0144 11/13/2016 0516 11/10/2016 0213  AST 145* 113* 66*  --   ALT 60 62 52  --   ALKPHOS 69 56 62  --   BILITOT 0.9 0.8 0.9  --   ALBUMIN 2.9* 2.8* 2.5* 2.5*    Cardiac Enzymes  Recent Labs Lab 10/23/16 1022  TROPONINI 0.12*    Glucose  Recent Labs Lab 11/18/2016 1736 11/04/2016 2014 11/21/2016 0116 11/01/2016 0339 11/06/2016 0745 10/30/2016 1224  GLUCAP 245* 232* 158* 184* 156* 132*    Imaging No results found.         ANTIBIOTICS: Rocephin  SIGNIFICANT EVENTS: Biopsy of frontal lobe mass 10/5     DISCUSSION: This is a 71 year old with a history of both systolic and diastolic dysfunction who is chronically immunosuppressed for renal transplants. He has a history of renal cancer and is just undergone biopsy of a right frontal lobe mass. He arises in the intensive care unit intubated  and mechanically ventilated. Current mental status precludes extubation he will be reevaluated in the morning. 11/12/2016  ASSESSMENT / PLAN:  PULMONARY A: Postoperative respiratory failure. I'll plan to adjust his ventilator settings to normocarbia overnight. Current neurological status precludes extubation. Reevaluate in the morning   CARDIOVASCULAR A:  History of both diastolic and systolic dysfunction. Intervene as required  RENAL A: Acute chronic renal insufficiency, status post transplant 2. Chronically immunosuppressed. Obviously avoiding any nephrotoxic's   Lars Masson, MD Pulmonary and [place] Pager: [phone]  11/09/2016, 6:53 PM

## 2016-10-29 NOTE — Anesthesia Preprocedure Evaluation (Addendum)
Anesthesia Evaluation  Patient identified by MRN, date of birth, ID band Patient confused    Reviewed: Allergy & Precautions, NPO status , Patient's Chart, lab work & pertinent test results  History of Anesthesia Complications (+) PROLONGED EMERGENCE  Airway Mallampati: III  TM Distance: >3 FB Neck ROM: Full    Dental  (+) Dental Advisory Given, Poor Dentition, Missing, Chipped   Pulmonary former smoker,    Pulmonary exam normal breath sounds clear to auscultation       Cardiovascular hypertension, + Cardiac Stents and +CHF  Normal cardiovascular exam+ Valvular Problems/Murmurs MR  Rhythm:Regular Rate:Normal  TTE 9/18 - Moderate LVH.Ejection fraction was in the range of 55%  to 60%. Grade 1 diastolic dysfunction. Mild MR, mildly dilated left atrium.   Neuro/Psych PSYCHIATRIC DISORDERS Right frontal mass    GI/Hepatic negative GI ROS, Neg liver ROS,   Endo/Other  negative endocrine ROS  Renal/GU DialysisRenal diseaseRight renal ca s/p nephrectomy  negative genitourinary   Musculoskeletal negative musculoskeletal ROS (+)   Abdominal   Peds  Hematology  (+) anemia ,   Anesthesia Other Findings Gout  Reproductive/Obstetrics                            Anesthesia Physical  Anesthesia Plan  ASA: III  Anesthesia Plan: General   Post-op Pain Management:    Induction: Intravenous  PONV Risk Score and Plan: 2 and Ondansetron, Dexamethasone and Treatment may vary due to age or medical condition  Airway Management Planned: Oral ETT  Additional Equipment: Arterial line, CVP and Ultrasound Guidance Line Placement  Intra-op Plan:   Post-operative Plan: Post-operative intubation/ventilation  Informed Consent: I have reviewed the patients History and Physical, chart, labs and discussed the procedure including the risks, benefits and alternatives for the proposed anesthesia with the patient or  authorized representative who has indicated his/her understanding and acceptance.   Dental advisory given  Plan Discussed with: CRNA  Anesthesia Plan Comments:       Anesthesia Quick Evaluation

## 2016-10-29 NOTE — Anesthesia Procedure Notes (Signed)
Procedure Name: Intubation Date/Time: 11/06/2016 1:15 PM Performed by: Garrison Columbus T Pre-anesthesia Checklist: Patient identified, Emergency Drugs available, Suction available and Patient being monitored Patient Re-evaluated:Patient Re-evaluated prior to induction Oxygen Delivery Method: Circle System Utilized Preoxygenation: Pre-oxygenation with 100% oxygen Induction Type: IV induction Ventilation: Mask ventilation without difficulty Laryngoscope Size: Miller and 2 Grade View: Grade I Tube type: Subglottic suction tube Tube size: 7.5 mm Number of attempts: 1 Airway Equipment and Method: Stylet and Oral airway Placement Confirmation: ETT inserted through vocal cords under direct vision,  positive ETCO2 and breath sounds checked- equal and bilateral Secured at: 23 cm Tube secured with: Tape Dental Injury: Teeth and Oropharynx as per pre-operative assessment

## 2016-10-29 NOTE — Anesthesia Procedure Notes (Signed)
Arterial Line Insertion Start/End10/12/2016 12:45 PM, 11/16/2016 12:49 PM Performed by: Garrison Columbus T, CRNA  Patient location: Pre-op. Preanesthetic checklist: patient identified, IV checked, site marked, risks and benefits discussed, surgical consent, monitors and equipment checked, pre-op evaluation and anesthesia consent Lidocaine 1% used for infiltration Left, radial was placed Catheter size: 20 G Hand hygiene performed  and maximum sterile barriers used   Attempts: 1 Procedure performed without using ultrasound guided technique. Ultrasound Notes:anatomy identified and no ultrasound evidence of intravascular and/or intraneural injection Following insertion, dressing applied and Biopatch. Post procedure assessment: normal  Patient tolerated the procedure well with no immediate complications.

## 2016-10-29 NOTE — Progress Notes (Signed)
NUTRITION NOTE  Consult for Cortrak tube received earlier this afternoon. This RD spoke with Dr. Wynelle Cleveland on the phone and she reports need for Cortrak d/t failed SLP evaluations and pt without nutrition since admission (9/27). At that time, she gave verbal order for RD to order appropriate TF and to order free water flush given hypernatremia (Na: 151 mmol/L this AM). Pt was being moved to pre-op at the time of RD discussion with MD and remains in the OR at this time. MD was unsure of a time frame for surgery.   Head CT had shown R frontal abnormality. Pt in OR for biopsy by Neurosurgery. Neurosurgery note in at 3:52 PM stating procedure was R frontal craniotomy for mass resection. Plan to move patient to the ICU.   Cortak service is available Monday, Wednesday, Friday, and Saturday from 8AM-4PM. Will plan for Cortak placement tomorrow (10/6) and TF regimen to be ordered following successful tube placement.      Jarome Matin, MS, RD, LDN, [facility] Inpatient Clinical Dietitian Pager # [phone] After hours/weekend pager # [phone]

## 2016-10-29 NOTE — Progress Notes (Signed)
SLP Cancellation Note  Patient Details Name: Roger Gibson MRN: [id number] DOB: Sep 20, 1945   Cancelled treatment:       Reason Eval/Treat Not Completed: Patient at procedure or test/unavailable. Spoke with RN, pt being taken down for craniotomy. SLP will s/o at this time; please reorder when medically stable.  Deneise Lever, [place], [place] Speech-Language Pathologist [phone]   Aliene Altes 11/23/2016, 11:58 AM

## 2016-10-30 ENCOUNTER — Inpatient Hospital Stay (HOSPITAL_COMMUNITY): Payer: Medicare HMO

## 2016-10-30 LAB — BPAM PLATELET PHERESIS
Blood Product Expiration Date: 201810061015
Blood Product Expiration Date: 201810062359
ISSUE DATE / TIME: 201810051103
ISSUE DATE / TIME: 201810051224
Unit Type and Rh: 6200
Unit Type and Rh: 8400

## 2016-10-30 LAB — RENAL FUNCTION PANEL
ALBUMIN: 2.1 g/dL — AB (ref 3.5–5.0)
ANION GAP: 8 (ref 5–15)
BUN: 38 mg/dL — AB (ref 6–20)
CHLORIDE: 121 mmol/L — AB (ref 101–111)
CO2: 23 mmol/L (ref 22–32)
Calcium: 8.6 mg/dL — ABNORMAL LOW (ref 8.9–10.3)
Creatinine, Ser: 1.49 mg/dL — ABNORMAL HIGH (ref 0.61–1.24)
GFR calc Af Amer: 52 mL/min — ABNORMAL LOW (ref >60)
GFR, EST NON AFRICAN AMERICAN: 45 mL/min — AB (ref >60)
Glucose, Bld: 151 mg/dL — ABNORMAL HIGH (ref 65–99)
PHOSPHORUS: 2.7 mg/dL (ref 2.5–4.6)
POTASSIUM: 3.5 mmol/L (ref 3.5–5.1)
Sodium: 152 mmol/L — ABNORMAL HIGH (ref 135–145)

## 2016-10-30 LAB — CULTURE, BLOOD (ROUTINE X 2)
CULTURE: NO GROWTH
Culture: NO GROWTH
SPECIAL REQUESTS: ADEQUATE
SPECIAL REQUESTS: ADEQUATE

## 2016-10-30 LAB — GLUCOSE, CAPILLARY
GLUCOSE-CAPILLARY: 111 mg/dL — AB (ref 65–99)
GLUCOSE-CAPILLARY: 131 mg/dL — AB (ref 65–99)
Glucose-Capillary: 184 mg/dL — ABNORMAL HIGH (ref 65–99)
Glucose-Capillary: 141 mg/dL — ABNORMAL HIGH (ref 65–99)
Glucose-Capillary: 132 mg/dL — ABNORMAL HIGH (ref 65–99)
Glucose-Capillary: 105 mg/dL — ABNORMAL HIGH (ref 65–99)

## 2016-10-30 LAB — PREPARE PLATELET PHERESIS
Unit division: 0
Unit division: 0

## 2016-10-30 LAB — CBC
HEMATOCRIT: 30.9 % — AB (ref 39.0–52.0)
HEMOGLOBIN: 9.6 g/dL — AB (ref 13.0–17.0)
MCH: 28.5 pg (ref 26.0–34.0)
MCHC: 31.1 g/dL (ref 30.0–36.0)
MCV: 91.7 fL (ref 78.0–100.0)
Platelets: 121 10*3/uL — ABNORMAL LOW (ref 150–400)
RBC: 3.37 MIL/uL — ABNORMAL LOW (ref 4.22–5.81)
RDW: 14.2 % (ref 11.5–15.5)
WBC: 3 10*3/uL — AB (ref 4.0–10.5)

## 2016-10-30 LAB — BASIC METABOLIC PANEL
Anion gap: 7 (ref 5–15)
BUN: 36 mg/dL — ABNORMAL HIGH (ref 6–20)
CO2: 23 mmol/L (ref 22–32)
Calcium: 8.5 mg/dL — ABNORMAL LOW (ref 8.9–10.3)
Chloride: 119 mmol/L — ABNORMAL HIGH (ref 101–111)
Creatinine, Ser: 1.52 mg/dL — ABNORMAL HIGH (ref 0.61–1.24)
GFR calc Af Amer: 51 mL/min — ABNORMAL LOW (ref >60)
GFR calc non Af Amer: 44 mL/min — ABNORMAL LOW (ref >60)
Glucose, Bld: 154 mg/dL — ABNORMAL HIGH (ref 65–99)
Potassium: 3.5 mmol/L (ref 3.5–5.1)
Sodium: 149 mmol/L — ABNORMAL HIGH (ref 135–145)

## 2016-10-30 LAB — ACID FAST SMEAR (AFB, MYCOBACTERIA): Acid Fast Smear: NEGATIVE

## 2016-10-30 MED ORDER — FREE WATER
300.0000 mL | Freq: Four times a day (QID) | Status: DC
  Administered 2016-10-30 – 2016-11-01 (×8): 300 mL

## 2016-10-30 MED ORDER — PRAVASTATIN SODIUM 20 MG PO TABS
20.0000 mg | ORAL_TABLET | Freq: Every day | ORAL | Status: DC
  Administered 2016-10-30 – 2016-11-11 (×13): 20 mg
  Filled 2016-10-30 (×13): qty 1

## 2016-10-30 MED ORDER — PROMETHAZINE HCL 25 MG PO TABS: 12.5000 mg | ORAL_TABLET | ORAL | Status: DC | PRN

## 2016-10-30 MED ORDER — BETHANECHOL CHLORIDE 10 MG PO TABS
10.0000 mg | ORAL_TABLET | Freq: Three times a day (TID) | ORAL | Status: DC
  Administered 2016-10-30 – 2016-11-12 (×38): 10 mg via ORAL
  Filled 2016-10-30 (×38): qty 1

## 2016-10-30 MED ORDER — SENNOSIDES-DOCUSATE SODIUM 8.6-50 MG PO TABS: 1.0000 | ORAL_TABLET | Freq: Every evening | ORAL | Status: DC | PRN

## 2016-10-30 MED ORDER — HYDROCODONE-ACETAMINOPHEN 5-325 MG PO TABS: 1.0000 | ORAL_TABLET | ORAL | Status: DC | PRN

## 2016-10-30 NOTE — Progress Notes (Signed)
Cortrak Tube Team Note:  Consult received to place a Cortrak feeding tube.   A 10 F Cortrak tube was placed in the left nare and secured with a nasal bridle at 75 cm. Per the Cortrak monitor reading the tube tip is gastric.   X-ray is required, abdominal x-ray has been ordered by the Cortrak team. Please confirm tube placement before using the Cortrak tube.   If the tube becomes dislodged please keep the tube and contact the Cortrak team at [email or web address] (password [email or web address]) for replacement.  If after hours and replacement cannot be delayed, place a NG tube and confirm placement with an abdominal x-ray.    Koleen Distance MS, RD, LDN Pager #- [phone] After Hours Pager: [phone]

## 2016-10-30 NOTE — Anesthesia Postprocedure Evaluation (Signed)
Anesthesia Post Note  Patient: Roger Gibson  Procedure(s) Performed: Right frontal craniotomy for mass resection with Brainlab (Right Head) APPLICATION OF CRANIAL NAVIGATION (N/A )     Patient location during evaluation: ICU Anesthesia Type: General Level of consciousness: patient remains intubated per anesthesia plan Pain management: pain level controlled Vital Signs Assessment: post-procedure vital signs reviewed and stable Respiratory status: patient remains intubated per anesthesia plan Cardiovascular status: blood pressure returned to baseline and stable Postop Assessment: no apparent nausea or vomiting Anesthetic complications: no Comments: To ICU    Last Vitals:  Vitals:   10/30/16 0500 10/30/16 0600  BP: (!) 161/78 (!) 158/79  Pulse: 71 74  Resp: 18 18  Temp:    SpO2: 100% 100%    Last Pain:  Vitals:   10/30/16 0354  TempSrc: Axillary  PainSc:                  Riccardo Dubin

## 2016-10-30 NOTE — Progress Notes (Signed)
Pt seen and examined. No issues overnight.  EXAM: Temp:  [97.1 F (36.2 C)-99.1 F (37.3 C)] 99 F (37.2 C) (10/06 0354) Pulse Rate:  [59-78] 74 (10/06 0700) Resp:  [15-34] 18 (10/06 0700) BP: (120-204)/(62-107) 155/75 (10/06 0700) SpO2:  [89 %-100 %] 100 % (10/06 0829) FiO2 (%):  [30 %-50 %] 30 % (10/06 0829) Weight:  [88.9 kg (195 lb 15.8 oz)-93 kg (205 lb)] 88.9 kg (195 lb 15.8 oz) (10/06 0417) Intake/Output      10/05 0701 - 10/06 0700 10/06 0701 - 10/07 0700   I.V. (mL/kg) 4747.4 (53.4)    Blood 351    IV Piggyback 857.5    Total Intake(mL/kg) 5955.9 (67)    Urine (mL/kg/hr) 700 (0.3)    Drains 110    Blood 80    Total Output 890     Net +5065.9           Intubated PERRL Withdraws throughout, weaker on left Bandage c/d/i  Stable Drain out today No active neurosurgical issues May move out of neuro ICU at primary team's discretion

## 2016-10-30 NOTE — Progress Notes (Signed)
[facility] Progress Note  Chief Complaint:   GI bleed   Subjective: No further bleeding per RN. Patient on vent  Objective:  Vital signs in last 24 hours: Temp:  [97.1 F (36.2 C)-100.1 F (37.8 C)] 100.1 F (37.8 C) (10/06 1144) Pulse Rate:  [59-78] 77 (10/06 1200) Resp:  [15-31] 18 (10/06 1200) BP: (112-204)/(61-107) 158/83 (10/06 1200) SpO2:  [89 %-100 %] 100 % (10/06 1200) FiO2 (%):  [30 %-50 %] 30 % (10/06 1200) Weight:  [195 lb 15.8 oz (88.9 kg)] 195 lb 15.8 oz (88.9 kg) (10/06 0417) Last BM Date: 11/06/2016 General:   sedated, well-developed, black male in NAD EENT:  Normal hearing, non icteric sclera, conjunctive pink.  Heart:  Regular rate and rhythm, no lower extremity edema Pulm: on vent Abdomen:  Soft, nondistended, a few bowel sounds Neurologic:  Sedated on vent  Intake/Output from previous day: 10/05 0701 - 10/06 0700 In: 5955.9 [I.V.:4747.4; Blood:351; IV Piggyback:857.5] Out: 890 [Urine:700; Drains:110; Blood:80] Intake/Output this shift: Total I/O In: 811.8 [I.V.:314.3; NG/GT:300; IV Piggyback:197.5] Out: -   Lab Results:  Recent Labs  11/07/2016 2122 11/19/2016 0222 10/30/16 0415  WBC 3.3* 3.7* 3.0*  HGB 12.4* 12.0* 9.6*  HCT 40.0 39.6 30.9*  PLT 87* 83* 121*   BMET  Recent Labs  11/03/2016 0213 11/24/2016 0716 10/30/16 0415  NA 156* 151* 152*  149*  K 4.0 4.1 3.5  3.5  CL 123* 121* 121*  119*  CO2 22 25 23  23   GLUCOSE 263* 190* 151*  154*  BUN 37* 34* 38*  36*  CREATININE 1.53* 1.64* 1.49*  1.52*  CALCIUM 9.4 9.2 8.6*  8.5*   LFT  Recent Labs  10/30/16 0415  ALBUMIN 2.1*    ASSESSMENT / PLAN:   1. Hematochezia, resolved. Etiology wasn't determined, patient high risk for procedures. Could have been hemorrhoidal or diverticular hemorrhage. Hgb didn't drop until early this am but suspect that is post-op related.  -I spoke with wife. No plans for endoscopic workup. He has too many acute medical issues at  present. No colon cancer on last colonoscopy in 2013.  -Call for questions or recurrent hematochezia.   2. Brain mass, s/p resection yesterday. Biopsies pending.  He has post-op respiratory failure, on vent  3. Renal transplant, on chronic immunosuppressants. Currently with AKI on CKD 3.   Principal Problem:   Acute focal neurological deficit Active Problems:   Hypokalemia   History of renal transplant   Immunosuppressed status (HCC)   AKI (acute kidney injury) ([place])   CKD (chronic kidney disease), stage III (HCC)   Elevated troponin   Vasogenic brain edema (HCC)   Acute encephalopathy   Chronic combined systolic and diastolic CHF (congestive heart failure) (HCC)   Metabolic acidosis   Acute respiratory failure (HCC)   SIRS (systemic inflammatory response syndrome) (HCC)   Pneumobilia   Acute respiratory distress   Elevated AST (SGOT)   Thrombocytopenia (HCC)   Abdominal distention   Brain metastases ([place])    LOS: 9 days   Tye Savoy ,NP 10/30/2016, 12:23 PM Pager number [phone]     Attending physician's note   I have taken an interval history, reviewed the chart and examined the patient. I agree with the Advanced Practitioner's note, impression and recommendations. No recurrent GI bleeding. Possible diverticular or hemorrhoidal bleeding. Please call if GI bleeding recurs. Given several severe medical problems no plans for endoscopic evaluation at this time. GI signing off. GI follow up  with Dr. Zenovia Jarred as outpatient.   [name], MD [facility] [phone] Mon-Fri 8a-5p [phone] after 5p, weekends, holidays

## 2016-10-30 NOTE — Progress Notes (Signed)
PULMONARY / CRITICAL CARE MEDICINE   Name: Roger Gibson MRN: [id number] DOB: September 27, 1945    ADMISSION DATE:  10/16/2016 CONSULTATION DATE:  11/07/2016  REFERRING MD: Dr. Wynelle Cleveland, [facility]  CHIEF COMPLAINT:  Lt arm weakness  HISTORY OF PRESENT ILLNESS:   71 yo male with Lt arm weakness and found to have frontal lobe mass.  Had craniotomy 10/05 and remained on vent post op.  PMHx of ESRD, HTN, combined CHF, Rt renal cell cancer s/p transplant.  SUBJECTIVE:  Remains sedated.  VITAL SIGNS: BP (!) 157/77   Pulse 79   Temp 100.1 F (37.8 C) (Axillary)   Resp 18   Ht 5\' 10"  (1.778 m)   Wt 195 lb 15.8 oz (88.9 kg)   SpO2 100%   BMI 28.12 kg/m   VENTILATOR SETTINGS: Vent Mode: PRVC FiO2 (%):  [30 %-50 %] 30 % Set Rate:  [18 bmp] 18 bmp Vt Set:  [590 mL-610 mL] 610 mL PEEP:  [5 cmH20] 5 cmH20 Plateau Pressure:  [16 cmH20-19 cmH20] 19 cmH20  INTAKE / OUTPUT: I/O last 3 completed shifts: In: 7883.4 [I.V.:6424.9; Blood:351; IV Piggyback:1107.5] Out: 1540 [Urine:1350; Drains:110; Blood:80]  PHYSICAL EXAMINATION:  General - sedated Eyes - pupils reactive ENT - ETT in place Cardiac - regular, no murmur Chest - no wheeze, rales Abd - soft, non tender Ext - no edema Skin - no rashes Neuro - RASS -2   LABS:  BMET  Recent Labs Lab 10/26/2016 0213 11/06/2016 0716 10/30/16 0415  NA 156* 151* 152*  149*  K 4.0 4.1 3.5  3.5  CL 123* 121* 121*  119*  CO2 22 25 23  23   BUN 37* 34* 38*  36*  CREATININE 1.53* 1.64* 1.49*  1.52*  GLUCOSE 263* 190* 151*  154*    Electrolytes  Recent Labs Lab 10/25/16 0732 10/26/16 0144 10/26/2016 0516 11/12/2016 0213 11/13/2016 0716 10/30/16 0415  CALCIUM 10.0 10.1 10.0 9.4 9.2 8.6*  8.5*  MG 2.1 2.0 1.7  --   --   --   PHOS 1.5* 2.2* 2.5 3.3  --  2.7    CBC  Recent Labs Lab 11/10/2016 2122 11/21/2016 0222 10/30/16 0415  WBC 3.3* 3.7* 3.0*  HGB 12.4* 12.0* 9.6*  HCT 40.0 39.6 30.9*  PLT 87* 83* 121*    Coag's No results  for input(s): APTT, INR in the last 168 hours.  Sepsis Markers No results for input(s): LATICACIDVEN, PROCALCITON, O2SATVEN in the last 168 hours.  ABG  Recent Labs Lab 11/19/2016 1928  PHART 7.333*  PCO2ART 44.0  PO2ART 191*    Liver Enzymes  Recent Labs Lab 10/25/16 0732 10/26/16 0144 11/23/2016 0516 11/13/2016 0213 10/30/16 0415  AST 145* 113* 66*  --   --   ALT 60 62 52  --   --   ALKPHOS 69 56 62  --   --   BILITOT 0.9 0.8 0.9  --   --   ALBUMIN 2.9* 2.8* 2.5* 2.5* 2.1*    Cardiac Enzymes No results for input(s): TROPONINI, PROBNP in the last 168 hours.  Glucose  Recent Labs Lab 10/30/2016 1224 11/18/2016 2041 11/03/2016 2304 10/30/16 0353 10/30/16 0800 10/30/16 1142  GLUCAP 132* 156* 129* 131* 184* 141*    Imaging Dg Chest Port 1 View  Result Date: 11/18/2016 CLINICAL DATA:  Intubated EXAM: PORTABLE CHEST 1 VIEW COMPARISON:  10/26/2016 chest radiograph. FINDINGS: Endotracheal tube tip is 3.5 cm above the carina. Enteric tube terminates in the gastric fundus. Stable vascular  stent overlying the left upper mediastinum. Stable cardiomediastinal silhouette with top-normal heart size and aortic atherosclerosis. No pneumothorax. No pleural effusion. Stable mild bibasilar scarring versus atelectasis. No pulmonary edema. IMPRESSION: 1. Well-positioned support structures as detailed. 2. Stable mild bibasilar scarring versus atelectasis . Electronically Signed   By: Ilona Sorrel M.D.   On: 11/19/2016 19:18     STUDIES:  EEG 9/27 >> lateralized periodic discharge Rt posterior quadrant with delta morphology Echo 9/30 >> EF 55 to 60%, mod LVH, grade 1 DD, mild MR  CULTURES: Blood 10/01 >> negative Brain 10/05 >>  ANTIBIOTICS:  SIGNIFICANT EVENTS: 09/27 Admit 10/05 Craniotomy  LINES/TUBES: ETT 10/05 >>  Lt femoral CVL 10/05 >>   ASSESSMENT / PLAN:  Acute encephalopathy with Lt arm weakness. Frontal lobe mass s/p craniotomy. - post op care per neurosurgery -  AEDs per neurology  Compromised airway. - reduce sedation and then try to wean vent  ESRD. S/p renal transplant. - per renal  GI bleed 10/03. - f/u CBC  DVT prophylaxis - SCDs SUP - Protonix Nutrition - NPO Goals of care - full code  Updated pt's wife at bedside  CC time 31 minutes  Chesley Mires, MD [place] 10/30/2016, 2:08 PM Pager:  [phone] After 3pm call: [phone]

## 2016-10-30 NOTE — Progress Notes (Signed)
[facility]  Patient remains intubated after surgery and will go to the ICU service. Please call [facility] when the patient is ready to transfer out of the ICU. Thank you.   Debbe Odea, MD

## 2016-10-30 NOTE — Progress Notes (Signed)
CCMD made aware of patient going into a ventricular rhythm several times. EKG obtained and rhythm recorded. RN will continue to monitor.

## 2016-10-30 NOTE — Progress Notes (Signed)
[facility] Rounding Note  Subjective:     S/p craniotomy Intubated Had been getting dextrose - stopped  Objective Vital signs in last 24 hours: Vitals:   10/30/16 0700 10/30/16 0800 10/30/16 0829 10/30/16 0900  BP: (!) 155/75 (!) 160/77  (!) 168/83  Pulse: 74 73  78  Resp: 18 18  18   Temp:  99.9 F (37.7 C)    TempSrc:  Axillary    SpO2: 100% 100% 100% 100%  Weight:      Height:       Weight change:   Intake/Output Summary (Last 24 hours) at 10/30/16 1115 Last data filed at 10/30/16 0900  Gross per 24 hour  Intake          6190.15 ml  Output              790 ml  Net          5400.15 ml   Physical Exam: Intubated Craniotomy incisions some oozing VS as noted  Regular rhythm S1S2 No S3 Abd soft.  No obvious allograft tenderness/transplant left pelvis firm and not tender No LE edema Very marked RUE edema ? Worsening (I don't recall it being this severe on prior exams) Foley clear urine   Recent Labs Lab 11/23/2016 0516 11/17/2016 0213 11/10/2016 0716 10/30/16 0415  NA 155* 156* 151* 152*  149*  K 3.4* 4.0 4.1 3.5  3.5  CL 121* 123* 121* 121*  119*  CO2 24 22 25 23  23   GLUCOSE 198* 263* 190* 151*  154*  BUN 30* 37* 34* 38*  36*  CREATININE 1.34* 1.53* 1.64* 1.49*  1.52*  CALCIUM 10.0 9.4 9.2 8.6*  8.5*  PHOS 2.5 3.3  --  2.7    Recent Labs Lab 10/25/16 0732 10/26/16 0144 11/20/2016 0516 11/19/2016 0213 10/30/16 0415  AST 145* 113* 66*  --   --   ALT 60 62 52  --   --   ALKPHOS 69 56 62  --   --   BILITOT 0.9 0.8 0.9  --   --   PROT 7.3 6.9 6.1*  --   --   ALBUMIN 2.9* 2.8* 2.5* 2.5* 2.1*     Recent Labs Lab 10/26/16 0144 11/20/2016 0516 11/12/2016 0213 10/25/2016 1106 11/12/2016 1624 10/26/2016 2122 11/09/2016 0222 10/30/16 0415  WBC 8.4 5.5 3.8* 5.3 3.8* 3.3* 3.7* 3.0*  NEUTROABS 7.2 4.8 3.1  --   --   --   --   --   HGB 14.2 13.4 13.5 12.2* 12.9* 12.4* 12.0* 9.6*  HCT 43.9 42.4 42.5 37.9* 42.7 40.0 39.6 30.9*  MCV 89.2 88.9 91.2 91.1 92.8 92.2 92.5  91.7  PLT 143* 124* 98* 90* 81* 87* 83* 121*    Recent Labs Lab 10/31/2016 1224 11/23/2016 2041 11/15/2016 2304 10/30/16 0353 10/30/16 0800  GLUCAP 132* 156* 129* 131* 184*   Studies/Results: Mr Brain W Contrast  Result Date: 11/13/2016 CLINICAL DATA:  71 year old male with right superior frontal gyrus expansile T2 and FLAIR hyperintense lesion. Postcontrast imaging requested for further characterization. The patient is status post nephrectomy for renal cell carcinoma, subsequently on dialysis, subsequent renal transplant. 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 Immunocompromised. EXAM: MRI HEAD WITH CONTRAST TECHNIQUE: Multiplanar, multiecho pulse sequences of the brain and surrounding structures were obtained with intravenous contrast. CONTRAST:  55m MULTIHANCE GADOBENATE DIMEGLUMINE 529 MG/ML IV SOLN COMPARISON:  Noncontrast MRI 11/09/2016, and earlier. FINDINGS: Following contrast administration there is faint and irregular, nodular enhancement at the T2/FLAIR hyperintense lesion demonstrated yesterday. Two main such foci of enhancement each measure 4-5 mm (series 7, image 18) and appear located along the superficial and medial cortical surfaces of the lesion. See also series 4, image 144 and series 6, image 23. The lesion is in the right superior frontal gyrus about 12-14 mm off of midline, in the region of the supplemental motor area (SMA). The nearby superior sagittal sinus appears to be enhancing and patent. Lateral to the sinus there appears to be a small arachnoid granulation in proximity to the lesion (small arrow on series 7, image 18). The other regional main cortical draining veins appear to be enhancing and patent. There is no pachymeningeal or defined leptomeningeal enhancement. No other abnormal intracranial enhancement. IMPRESSION: The right superior frontal gyrus, T2/FLAIR  hyperintense lesion in the region of the right supplemental motor area (SMA) demonstrates faint nodular and cortical enhancement following contrast. The regional venous structures including the superior sagittal sinus appear patent. I note that the lesion is nearly subjacent to a small arachnoid granulation. No associated pachymeningeal or convincing leptomeningeal thickening or enhancement. No other abnormal intracranial enhancement. The enhancement pattern is not typical of either metastatic disease or a high-grade glioma. And I feel the main differential considerations in this clinical setting are a focal infectious or inflammatory encephalitis versus a grade 2 or grade 3 glioma. Electronically Signed   By: Genevie Ann M.D.   On: 10/31/2016 15:36   Dg Chest Port 1 View  Result Date: 11/16/2016 CLINICAL DATA:  Intubated EXAM: PORTABLE CHEST 1 VIEW COMPARISON:  10/26/2016 chest radiograph. FINDINGS: Endotracheal tube tip is 3.5 cm above the carina. Enteric tube terminates in the gastric fundus. Stable vascular stent overlying the left upper mediastinum. Stable cardiomediastinal silhouette with top-normal heart size and aortic atherosclerosis. No pneumothorax. No pleural effusion. Stable mild bibasilar scarring versus atelectasis. No pulmonary edema. IMPRESSION: 1. Well-positioned support structures as detailed. 2. Stable mild bibasilar scarring versus atelectasis . Electronically Signed   By: Ilona Sorrel M.D.   On: 11/23/2016 19:18   Medications: Infusions: . sodium chloride 10 mL/hr at 10/30/16 0700  . sodium chloride 10 mL/hr at 10/30/16 0700  . dextrose Stopped (10/30/16 0819)  . lacosamide (VIMPAT) IV 200 mg (10/30/16 1010)  . levETIRAcetam Stopped (10/30/16 0410)  . levETIRAcetam Stopped (10/30/16 1024)  . mycophenolate (CELLCEPT) IV Stopped (10/30/16 0819)  . propofol (DIPRIVAN) infusion 15 mcg/kg/min (10/30/16 0859)  . valproate sodium Stopped (10/30/16 0725)    Scheduled Medications: .  chlorhexidine gluconate (MEDLINE KIT)  15 mL Mouth Rinse BID  . cholecalciferol  2,000 Units Oral Daily  . dexamethasone  1 mg Intravenous Q6H  . Influenza vac split quadrivalent PF  0.5 mL Intramuscular Tomorrow-1000  . insulin aspart  0-9 Units Subcutaneous Q4H  . levalbuterol  0.63 mg Nebulization BID  . mouth rinse  15 mL Mouth Rinse 10 times per day  . metoprolol tartrate  5 mg Intravenous Q8H  . pantoprazole (PROTONIX) IV  40 mg Intravenous Q12H  . pantoprazole (PROTONIX) IV  40 mg Intravenous QHS  . pravastatin  20 mg Oral q1800  . sodium chloride flush  3 mL Intravenous Q12H  . tacrolimus  3 mg Oral BID   Or  . tacrolimus  1.5 mg Sublingual BID  . 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 Baseline creatinine 1.5 and 1.7. Pred/prograf/myfortic. Other PMH hypertension, chronic combined systolic/diastolic CHF (96-04%), history of right kidney cancer. Presented to ED with left-sided weakness, admitted with sz- brain imaging possible/probable frontal lobe brain mass  1. Renal xpl #2 - BL creatinine 1.5-1.7. AKI on CKD (creatinine 2.29 on admission) on setting of hospitalization for possibly stroke/seizure/possible brain tumor with decreased mental status.  1. Creatinine at baseline 2. Pharmacy assisting  to get his antirejection medications changed to alternate form.  3. TAC trough ^ 10.3 on 9/30 (did not come back until 10/2) 4. Reduced prograf dosing to 3 mg BID 10/2, and 1.5 mg 10/4 (SL);  5. SL TAC, IV mycophenolate; PO prednisone on hold while receiving IV decadron 6. Plan to recheck TAC level 54/0.  2. Metabolic acidosis - resolved 3. Hypokalemia - replete prn 4. Hypernatremia - dextrose infusion stopped post craniotomy 1. Will give free water via  OGT 5. Frontal lobe lesion/left sided weakness/lethargy - s/p craniotomy. Path and cultures/smears pending  6. Systolic and diastolic HF 7. HTN - currently IV metoprolol 8. Assymetric RUE edema - [place] UE 9. Post op Hb variation - Hb 12->9.6. Trend  Jamal Maes, MD [facility] [phone] Pager 10/30/2016, 11:15 AM

## 2016-10-30 NOTE — Progress Notes (Signed)
Nutrition Follow Up Note   DOCUMENTATION CODES:   Not applicable  INTERVENTION:   Recommend Vital AF 1.2 @ goal rate of 78m/hr.   Free water flushes 156mq 6 hrs   Regimen provides 1872kcal/day, 117g/day, 186518may free water  Propofol: 8.4 ml/hr- provides additional 222kcal/day   Pt is at high refeeding risk; recommend monitor K, Mg, P for 3 days after tube feed initiation.      NUTRITION DIAGNOSIS:   Inadequate oral intake related to inability to eat, lethargy/confusion as evidenced by NPO status.  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 He also has a prior history of renal cancer. 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  Pt s/p craniotomy 10/5. Visited pt's room today. Pt intubated so history obtained from pt's family at bedside. Pt's family reports that pt with good appetite and oral intake pta. Pt has not met his estimated needs since admit now >7 days. Pt has likely now become malnourished but does not officially meet criteria; it is difficult to determine if muscle or fat wasting r/t edema. Per pt's family, no recent weight loss. Plan for Cortrak tube placement today and extubation tomorrow with initiation of tube feeds. Pt is at high refeeding risk; recommend monitor K, Mg, P for 3 days after tube feed initiation.     Medications reviewed and include: Vitamin D, dexamethasone, insulin, protonix, propofol   Labs reviewed: Na 152(H), Cl 121(H), BUN 38(H), creat 1.49(H), Ca 8.6(L) adj. 10.12 wnl, alb 2.1(L), P 2.7 wnl Triglycerides-  296(H)- 10/5 Wbc- 3.0(L), Hgb 9.6(L), Hct 30.9(L) cbgs- 190, 154, 151 x 24 hrs  Nutrition-Focused physical exam completed. Findings are no fat depletion, mild muscle depletions in clavicles, and moderate generalized edema.   Patient is currently intubated on ventilator support MV: 8.4 L/min Temp (24hrs), Avg:98.9 F (37.2 C), Min:97.6 F (36.4 C), Max:100.1 F (37.8 C)  Propofol: 8.4 ml/hr- provides 222kcal/day   MAP- >67m68m Diet Order:   NPO  Skin:  Reviewed, no issues  Last BM:  10/26/2016  Height:   Ht Readings from Last 1 Encounters:  10/30/2016 5' 10"  (1.778 m)    Weight:   Wt Readings from Last 1 Encounters:  10/30/16 195 lb 15.8 oz (88.9 kg)    Ideal Body Weight:  75.5 kg  BMI:  Body mass index is 28.12 kg/m.  Estimated Nutritional Needs:   Kcal:  1942kcal/day   Protein:  106-124g/day   Fluid:  >2L/day   EDUCATION NEEDS:   Education needs no appropriate at this time  CaseKoleen Distance RD, LDN [place] #- [phone] Hours Pager: [phone]

## 2016-10-30 NOTE — Op Note (Signed)
10/22/2016 - 10/30/2016  12:41 PM  PATIENT:  Roger Gibson  71 y.o. male  PRE-OPERATIVE DIAGNOSIS:  Right posterior frontal mass  POST-OPERATIVE DIAGNOSIS:  Same  PROCEDURE:  Right frontal craniotomy for excision of intrinsic brain mass with stereotactic navigation  SURGEON:  Aldean Ast, MD  ASSISTANTS: Ferne Reus, PA-C  ANESTHESIA:   General  DRAINS: JP   SPECIMEN:  Brain mass  INDICATION FOR PROCEDURE: 71 year old male with altered mental status, recent seizure, and a right frontal brain mass.  I recommended the above procedure.  The patient's wife understood the risks, benefits, and alternatives and potential outcomes and wished to proceed.  PROCEDURE DETAILS: After smooth induction of general endotracheal anesthesia the patient's head was fixated in [place] pins.  The area of the planned incision was clipped of hair and wiped down with alcohol.  The brainlab stereotactic system was registered to the patient's surface anatomy.  The tumor margins were marked on the skin.  The area of the planned incision was injected with lidocaine and marcaine with epinephrine.  The patient was prepped and draped in the usual sterile fashion.  The scalp was sharply incised and a trap door style incision was opened and elevated and secured across the midline to the left.  The brainlab system was used to confirm the location of the mass.  One burr hole was drilled laterally and two were drilled on the midline.  The underlying dura was carefully separated from the inner table of the skull.  The high speed drill with a side cutting burr and footplate attachment was used to create a craniotomy connecting the burrholes.  There was minimal sinus bleeding which was controlled with topical hemostatic agents.  The dura was opened in a C shaped fashion towards the sinus.  There was a bridging vein running within the dura.  This was gently separated from the dura with microinstruments.  This allowed  the dura to be reflected completely.  The brainlab system was used to identify the area of the mass.  I confirmed the surrounding landmarks including the bridging vein and surrounding gyri and was satisfied that the gyrus indicated by the brainlab was the target.  I coagulated the dura over the sulci around the gyrus of interest and sharply incised it.  I resected the entire gyrus and collected it for pathology and cultures.  This was also sent for frozen evaluation.  There was nothing that was particularly abnormal about the tissue that was resected.  I used brainlab to confirm that the depth of my resection was sufficient.  I irrigated vigorously with bacitracin saline.  Hemostasis was obtained with bipolar cautery and surgicel.  The dura was closed with interrupted nurolon suture.  The craniotomy flap was fixated in appropriate position with titanium screws.  A JP drain was placed and tunneled to a remote exit site.  The galea was closed with interrupted vicryl sutures.  The skin was closed with staples.  A sterile bandage was applied.  The patient was removed from [place] pins without incident.  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  PATIENT DISPOSITION:  ICU - intubated and hemodynamically stable.   Delay start of Pharmacological VTE agent (>24hrs) due to surgical blood loss or risk of bleeding:  yes

## 2016-10-31 ENCOUNTER — Inpatient Hospital Stay (HOSPITAL_COMMUNITY): Payer: Medicare HMO

## 2016-10-31 ENCOUNTER — Encounter (HOSPITAL_COMMUNITY): Payer: Self-pay | Admitting: Neurological Surgery

## 2016-10-31 DIAGNOSIS — R609 Edema, unspecified: Secondary | ICD-10-CM

## 2016-10-31 LAB — CBC
HCT: 30.4 % — ABNORMAL LOW (ref 39.0–52.0)
HEMOGLOBIN: 9.5 g/dL — AB (ref 13.0–17.0)
MCH: 28.6 pg (ref 26.0–34.0)
MCHC: 31.3 g/dL (ref 30.0–36.0)
MCV: 91.6 fL (ref 78.0–100.0)
Platelets: 104 10*3/uL — ABNORMAL LOW (ref 150–400)
RBC: 3.32 MIL/uL — AB (ref 4.22–5.81)
RDW: 13.9 % (ref 11.5–15.5)
WBC: 4.6 10*3/uL (ref 4.0–10.5)

## 2016-10-31 LAB — TACROLIMUS LEVEL: Tacrolimus (FK506) - LabCorp: 10.4 ng/mL (ref 2.0–20.0)

## 2016-10-31 LAB — GLUCOSE, CAPILLARY
GLUCOSE-CAPILLARY: 110 mg/dL — AB (ref 65–99)
GLUCOSE-CAPILLARY: 124 mg/dL — AB (ref 65–99)
Glucose-Capillary: 108 mg/dL — ABNORMAL HIGH (ref 65–99)
Glucose-Capillary: 139 mg/dL — ABNORMAL HIGH (ref 65–99)
Glucose-Capillary: 138 mg/dL — ABNORMAL HIGH (ref 65–99)
Glucose-Capillary: 163 mg/dL — ABNORMAL HIGH (ref 65–99)

## 2016-10-31 LAB — RENAL FUNCTION PANEL
ALBUMIN: 2.1 g/dL — AB (ref 3.5–5.0)
ANION GAP: 6 (ref 5–15)
BUN: 26 mg/dL — ABNORMAL HIGH (ref 6–20)
CALCIUM: 8.4 mg/dL — AB (ref 8.9–10.3)
CO2: 24 mmol/L (ref 22–32)
Chloride: 118 mmol/L — ABNORMAL HIGH (ref 101–111)
Creatinine, Ser: 1.33 mg/dL — ABNORMAL HIGH (ref 0.61–1.24)
GFR calc non Af Amer: 52 mL/min — ABNORMAL LOW (ref >60)
GLUCOSE: 135 mg/dL — AB (ref 65–99)
PHOSPHORUS: 2.5 mg/dL (ref 2.5–4.6)
Potassium: 3.6 mmol/L (ref 3.5–5.1)
SODIUM: 148 mmol/L — AB (ref 135–145)

## 2016-10-31 LAB — MAGNESIUM: Magnesium: 1.7 mg/dL (ref 1.7–2.4)

## 2016-10-31 LAB — PHOSPHORUS: Phosphorus: 2.1 mg/dL — ABNORMAL LOW (ref 2.5–4.6)

## 2016-10-31 MED ORDER — ACETAMINOPHEN 325 MG PO TABS
650.0000 mg | ORAL_TABLET | ORAL | Status: DC | PRN
  Administered 2016-10-31 – 2016-11-01 (×2): 650 mg
  Filled 2016-10-31 (×3): qty 2

## 2016-10-31 MED ORDER — CHLORHEXIDINE GLUCONATE CLOTH 2 % EX PADS
6.0000 | MEDICATED_PAD | Freq: Every day | CUTANEOUS | Status: DC
  Administered 2016-11-01: 6 via TOPICAL

## 2016-10-31 MED ORDER — SODIUM CHLORIDE 0.9% FLUSH
10.0000 mL | Freq: Two times a day (BID) | INTRAVENOUS | Status: DC
  Administered 2016-10-31: 20 mL
  Administered 2016-11-01 – 2016-11-05 (×7): 10 mL
  Administered 2016-11-06: 20 mL
  Administered 2016-11-06 – 2016-11-09 (×4): 10 mL
  Administered 2016-11-09: 20 mL
  Administered 2016-11-10 – 2016-11-11 (×3): 10 mL

## 2016-10-31 MED ORDER — VITAL HIGH PROTEIN PO LIQD
1000.0000 mL | ORAL | Status: DC
  Administered 2016-10-31: 1000 mL
  Administered 2016-11-01: 08:00:00
  Administered 2016-11-01: 1000 mL

## 2016-10-31 MED ORDER — SODIUM CHLORIDE 0.9% FLUSH: 10.0000 mL | INTRAVENOUS | Status: DC | PRN

## 2016-10-31 MED ORDER — MYCOPHENOLATE 200 MG/ML ORAL SUSPENSION
250.0000 mg | Freq: Two times a day (BID) | ORAL | Status: DC
  Administered 2016-10-31 – 2016-11-12 (×24): 250 mg via ORAL
  Filled 2016-10-31 (×30): qty 5

## 2016-10-31 MED ORDER — LEVALBUTEROL HCL 0.63 MG/3ML IN NEBU: 0.6300 mg | INHALATION_SOLUTION | Freq: Four times a day (QID) | RESPIRATORY_TRACT | Status: DC | PRN

## 2016-10-31 MED ORDER — ACETAMINOPHEN 650 MG RE SUPP: 650.0000 mg | RECTAL | Status: DC | PRN

## 2016-10-31 MED ORDER — PRO-STAT SUGAR FREE PO LIQD
30.0000 mL | Freq: Two times a day (BID) | ORAL | Status: DC
  Administered 2016-10-31 – 2016-11-01 (×3): 30 mL
  Filled 2016-10-31 (×3): qty 30

## 2016-10-31 MED ORDER — METOPROLOL TARTRATE 25 MG/10 ML ORAL SUSPENSION
50.0000 mg | Freq: Two times a day (BID) | ORAL | Status: DC
  Administered 2016-10-31 – 2016-11-07 (×16): 50 mg
  Filled 2016-10-31 (×16): qty 20

## 2016-10-31 MED ORDER — PANTOPRAZOLE SODIUM 40 MG PO PACK
40.0000 mg | PACK | ORAL | Status: DC
  Administered 2016-10-31 – 2016-11-13 (×14): 40 mg
  Filled 2016-10-31 (×14): qty 20

## 2016-10-31 NOTE — Progress Notes (Signed)
Transported pt to CT via ventilator. Pt remained stable throughout transport.

## 2016-10-31 NOTE — Progress Notes (Signed)
Called regarding follow up EEG and if bandages were off to perform test. Pt down to CT. Neuro notified and stated can be done in the morning.

## 2016-10-31 NOTE — Progress Notes (Signed)
[facility] Rounding Note  Subjective:     S/p craniotomy - no path yet Remains intubated [place] of RUE brachial vein DVT  Objective Vital signs in last 24 hours: Vitals:   10/31/16 1100 10/31/16 1151 10/31/16 1159 10/31/16 1200  BP: (!) 166/81 (!) 182/88  (!) 175/85  Pulse: 97 99  (!) 101  Resp: (!) 22 (!) 23  (!) 31  Temp:   (!) 100.5 F (38.1 C)   TempSrc:   Axillary   SpO2: 97% 96%  96%  Weight:      Height:       Weight change: -3.287 kg (-7 lb 4 oz)  Intake/Output Summary (Last 24 hours) at 10/31/16 1346 Last data filed at 10/31/16 1200  Gross per 24 hour  Intake           1976.2 ml  Output             2000 ml  Net            -23.8 ml   Physical Exam: Intubated/ventilated OGT Craniotomy incisions VS as noted  Regular rhythm S1S2 No S3 Abd soft.  No obvious allograft tenderness/transplant left pelvis firm and not tender No LE edema Very marked RUE edema ? Worsening (I don't recall it being this severe on prior exams) Foley clear urine   Recent Labs Lab 11/01/2016 0213 11/15/2016 0716 10/30/16 0415 10/31/16 0232  NA 156* 151* 152*  149* 148*  K 4.0 4.1 3.5  3.5 3.6  CL 123* 121* 121*  119* 118*  CO2 22 25 23  23 24   GLUCOSE 263* 190* 151*  154* 135*  BUN 37* 34* 38*  36* 26*  CREATININE 1.53* 1.64* 1.49*  1.52* 1.33*  CALCIUM 9.4 9.2 8.6*  8.5* 8.4*  PHOS 3.3  --  2.7 2.5    Recent Labs Lab 10/25/16 0732 10/26/16 0144 11/04/2016 0516 11/13/2016 0213 10/30/16 0415 10/31/16 0232  AST 145* 113* 66*  --   --   --   ALT 60 62 52  --   --   --   ALKPHOS 69 56 62  --   --   --   BILITOT 0.9 0.8 0.9  --   --   --   PROT 7.3 6.9 6.1*  --   --   --   ALBUMIN 2.9* 2.8* 2.5* 2.5* 2.1* 2.1*     Recent Labs Lab 10/26/16 0144 11/21/2016 0516 10/26/2016 0213  11/09/2016 1624 11/19/2016 2122 11/22/2016 0222 10/30/16 0415 10/31/16 0232  WBC 8.4 5.5 3.8*  < > 3.8* 3.3* 3.7* 3.0* 4.6  NEUTROABS 7.2 4.8 3.1  --   --   --   --   --   --   HGB 14.2 13.4 13.5  < >  12.9* 12.4* 12.0* 9.6* 9.5*  HCT 43.9 42.4 42.5  < > 42.7 40.0 39.6 30.9* 30.4*  MCV 89.2 88.9 91.2  < > 92.8 92.2 92.5 91.7 91.6  PLT 143* 124* 98*  < > 81* 87* 83* 121* 104*  < > = values in this interval not displayed.  Recent Labs Lab 10/30/16 1940 10/30/16 2351 10/31/16 0426 10/31/16 0823 10/31/16 1157  GLUCAP 105* 111* 108* 110* 124*   Studies/Results: Ct Head Wo Contrast  Result Date: 10/31/2016 CLINICAL DATA:  Altered level of consciousness. EXAM: CT HEAD WITHOUT CONTRAST TECHNIQUE: Contiguous axial images were obtained from the base of the skull through the vertex without intravenous contrast. COMPARISON:  MR brain without contrast  11/03/2016. MRI brain with contrast 11/06/2016. CT head without contrast 10/22/2016. FINDINGS: Brain: Posterior right frontal craniotomy is noted. Gas and fluid are present within the resection cavity in the posterior right frontal lobe. This corresponds to the very of neoplasm. A small amount of extra-axial hemorrhage overlies the resection cavity. Sulcal effacement is slightly improved following surgery. No acute infarct, hemorrhage, or mass lesion is present elsewhere. Ventricles are of normal size. The brainstem and cerebellum are normal. Vascular: Minimal vascular calcifications are present within the cavernous internal carotid arteries. There is no hyperdense vessel. Skull: Right frontal craniotomy is noted. Calvarium is otherwise intact. Sinuses/Orbits: Minimal fluid is present within the anterior left ethmoid air cells. Bilateral maxillary antrostomies are noted. The paranasal sinuses are otherwise clear. Mastoid air cells are clear. The globes and orbits are within normal limits bilaterally. IMPRESSION: 1. Right frontal craniotomy with expected changes in the surgical cavity. 2. Minimal extra-axial hemorrhage overlying the surgical cavity, within normal limits. 3. Slight reduction in mass effect. 4. No evidence for complication on CT. No acute  intracranial abnormality. Electronically Signed   By: San Morelle M.D.   On: 10/31/2016 13:36   Dg Chest Port 1 View  Result Date: 11/10/2016 CLINICAL DATA:  Intubated EXAM: PORTABLE CHEST 1 VIEW COMPARISON:  10/26/2016 chest radiograph. FINDINGS: Endotracheal tube tip is 3.5 cm above the carina. Enteric tube terminates in the gastric fundus. Stable vascular stent overlying the left upper mediastinum. Stable cardiomediastinal silhouette with top-normal heart size and aortic atherosclerosis. No pneumothorax. No pleural effusion. Stable mild bibasilar scarring versus atelectasis. No pulmonary edema. IMPRESSION: 1. Well-positioned support structures as detailed. 2. Stable mild bibasilar scarring versus atelectasis . Electronically Signed   By: Ilona Sorrel M.D.   On: 10/26/2016 19:18   Dg Abd Portable 1v  Result Date: 10/30/2016 CLINICAL DATA:  Feeding tube placement EXAM: PORTABLE ABDOMEN - 1 VIEW COMPARISON:  10/26/2016 FINDINGS: Interval placement of a feeding tube with the tip projecting over the antrum of the stomach. There is no bowel dilatation to suggest obstruction. There is no evidence of pneumoperitoneum, portal venous gas or pneumatosis. There are no pathologic calcifications along the expected course of the ureters. The osseous structures are unremarkable. IMPRESSION: Interval placement of a feeding tube with the tip projecting over the antrum of the stomach. Electronically Signed   By: Kathreen Devoid   On: 10/30/2016 17:29   Medications: Infusions: . lacosamide (VIMPAT) IV Stopped (10/31/16 1003)  . levETIRAcetam Stopped (10/31/16 0615)  . levETIRAcetam Stopped (10/31/16 1054)  . propofol (DIPRIVAN) infusion Stopped (10/31/16 0700)  . valproate sodium Stopped (10/31/16 0719)    Scheduled Medications: . bethanechol  10 mg Oral TID  . chlorhexidine gluconate (MEDLINE KIT)  15 mL Mouth Rinse BID  . cholecalciferol  2,000 Units Oral Daily  . dexamethasone  1 mg Intravenous Q6H   . feeding supplement (PRO-STAT SUGAR FREE 64)  30 mL Per Tube BID  . feeding supplement (VITAL HIGH PROTEIN)  1,000 mL Per Tube Q24H  . free water  300 mL Per Tube Q6H  . insulin aspart  0-9 Units Subcutaneous Q4H  . mouth rinse  15 mL Mouth Rinse 10 times per day  . metoprolol tartrate  50 mg Per Tube BID  . mycophenolate  250 mg Oral BID  . pantoprazole sodium  40 mg Per Tube Q24H  . pravastatin  20 mg Per Tube q1800  . sodium chloride flush  3 mL Intravenous Q12H  . tacrolimus  3 mg  Oral BID   Or  . 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 Baseline creatinine 1.5 and 1.7. Pred/prograf/myfortic. 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 Presented to ED with left-sided weakness, admitted with sz- brain imaging possible/probable frontal lobe brain mass  1. Frontal lobe lesion/left sided weakness/lethargy - s/p craniotomy. Path and cultures/smears pending 2. Vent - per CCM 3. Renal xpl #2 - BL creatinine 1.5-1.7. AKI on CKD (creatinine 2.29 on admission) on setting of hospitalization for possibly stroke/seizure/possible brain tumor with decreased mental status.  Creatinine at baseline. Getting IV steroids (currently decadron for brain issues so pred on hold) and IV mycophenolate, SL prograf at 1/2 dose. TAC trough ^ 10.3 on 9/30 (did not come back until 10/2) Reduced prograf dosing to 3 mg BID 10/2, and 1.5 mg BID 10/4 (SL);  1. Repeat TAC level pending from this am 10/7 for further dose adjustment 4. Metabolic acidosis - resolved 5. Hypokalemia - replete prn 6. Hypernatremia - Getting free water via OGT. Slow improvement 7. Systolic and diastolic HF 8. HTN - currently IV metoprolol 9. Assymetric RUE  edema - [place] UE today brachial vein DVT (no extension into axillary v) - will alert Dr. Halford Chessman.  I would presume heparin contraindicated in light of recent craniotomy 10. Post op Hb variation - Hb 12->9.6. Trend  Jamal Maes, MD [facility] [phone] Pager 10/31/2016, 1:46 PM

## 2016-10-31 NOTE — Progress Notes (Signed)
VASCULAR LAB PRELIMINARY  PRELIMINARY  PRELIMINARY  PRELIMINARY  Right upper extremity venous duplex completed.    Preliminary report:  There is acute DVT noted in the right brachial vein and superficial thrombus noted in the right cephalic vein.   Susa Simmonds, Therapist, sports.  Meredith Kilbride, RVT 10/31/2016, 1:45 PM

## 2016-10-31 NOTE — Progress Notes (Signed)
PULMONARY / CRITICAL CARE MEDICINE   Name: Roger Gibson MRN: [id number] DOB: 16-Dec-1945    ADMISSION DATE:  10/10/2016 CONSULTATION DATE:  11/06/2016  REFERRING MD: Dr. Wynelle Cleveland, [facility]  CHIEF COMPLAINT:  Lt arm weakness  HISTORY OF PRESENT ILLNESS:   71 yo male with Lt arm weakness and found to have frontal lobe mass.  Had craniotomy 10/05 and remained on vent post op.  PMHx of ESRD, HTN, combined CHF, Rt renal cell cancer s/p transplant.  SUBJECTIVE:  Tolerating pressure support better.  VITAL SIGNS: BP (!) 182/88   Pulse 99   Temp (!) 100.5 F (38.1 C) (Axillary)   Resp (!) 23   Ht 5\' 10"  (1.778 m)   Wt 197 lb 12 oz (89.7 kg)   SpO2 96%   BMI 28.37 kg/m   VENTILATOR SETTINGS: Vent Mode: PSV;BIPAP FiO2 (%):  [30 %] 30 % Set Rate:  [18 bmp] 18 bmp Vt Set:  [610 mL] 610 mL PEEP:  [5 cmH20] 5 cmH20 Pressure Support:  [12 cmH20] 12 cmH20 Plateau Pressure:  [15 cmH20-20 cmH20] 18 cmH20  INTAKE / OUTPUT: I/O last 3 completed shifts: In: 3488.8 [I.V.:1663.8; NG/GT:600; IV Piggyback:1225] Out: 2555 [Urine:2500; Drains:55]  PHYSICAL EXAMINATION:  General - on vent Eyes - pupils reactive ENT - ETT in place Cardiac - regular, no murmur Chest - no wheeze, rales Abd - soft, non tender Ext - no edema Skin - no rashes Neuro - somnolent, not following commands  LABS:  BMET  Recent Labs Lab 11/22/2016 0716 10/30/16 0415 10/31/16 0232  NA 151* 152*  149* 148*  K 4.1 3.5  3.5 3.6  CL 121* 121*  119* 118*  CO2 25 23  23 24   BUN 34* 38*  36* 26*  CREATININE 1.64* 1.49*  1.52* 1.33*  GLUCOSE 190* 151*  154* 135*    Electrolytes  Recent Labs Lab 10/25/16 0732 10/26/16 0144 11/06/2016 0516 11/22/2016 0213 11/04/2016 0716 10/30/16 0415 10/31/16 0232  CALCIUM 10.0 10.1 10.0 9.4 9.2 8.6*  8.5* 8.4*  MG 2.1 2.0 1.7  --   --   --   --   PHOS 1.5* 2.2* 2.5 3.3  --  2.7 2.5    CBC  Recent Labs Lab 11/01/2016 0222 10/30/16 0415 10/31/16 0232  WBC 3.7*  3.0* 4.6  HGB 12.0* 9.6* 9.5*  HCT 39.6 30.9* 30.4*  PLT 83* 121* 104*    Coag's No results for input(s): APTT, INR in the last 168 hours.  Sepsis Markers No results for input(s): LATICACIDVEN, PROCALCITON, O2SATVEN in the last 168 hours.  ABG  Recent Labs Lab 11/22/2016 1928  PHART 7.333*  PCO2ART 44.0  PO2ART 191*    Liver Enzymes  Recent Labs Lab 10/25/16 0732 10/26/16 0144 11/04/2016 0516 11/12/2016 0213 10/30/16 0415 10/31/16 0232  AST 145* 113* 66*  --   --   --   ALT 60 62 52  --   --   --   ALKPHOS 69 56 62  --   --   --   BILITOT 0.9 0.8 0.9  --   --   --   ALBUMIN 2.9* 2.8* 2.5* 2.5* 2.1* 2.1*    Cardiac Enzymes No results for input(s): TROPONINI, PROBNP in the last 168 hours.  Glucose  Recent Labs Lab 10/30/16 1558 10/30/16 1940 10/30/16 2351 10/31/16 0426 10/31/16 0823 10/31/16 1157  GLUCAP 132* 105* 111* 108* 110* 124*    Imaging Dg Abd Portable 1v  Result Date: 10/30/2016 CLINICAL DATA:  Feeding tube placement EXAM: PORTABLE ABDOMEN - 1 VIEW COMPARISON:  10/26/2016 FINDINGS: Interval placement of a feeding tube with the tip projecting over the antrum of the stomach. There is no bowel dilatation to suggest obstruction. There is no evidence of pneumoperitoneum, portal venous gas or pneumatosis. There are no pathologic calcifications along the expected course of the ureters. The osseous structures are unremarkable. IMPRESSION: Interval placement of a feeding tube with the tip projecting over the antrum of the stomach. Electronically Signed   By: Kathreen Devoid   On: 10/30/2016 17:29     STUDIES:  EEG 9/27 >> lateralized periodic discharge Rt posterior quadrant with delta morphology Echo 9/30 >> EF 55 to 60%, mod LVH, grade 1 DD, mild MR  CULTURES: Blood 10/01 >> negative Brain 10/05 >>  ANTIBIOTICS:  SIGNIFICANT EVENTS: 09/27 Admit 10/05 Craniotomy  LINES/TUBES: ETT 10/05 >>  Lt femoral CVL 10/05 >>  ASSESSMENT / PLAN:  Acute  encephalopathy with Lt arm weakness. Frontal lobe mass s/p craniotomy. - post op care per neurosurgery - AEDs per neurology  Compromised airway. - pressure support wean >> defer extubation trial until mental status better  ESRD. S/p renal transplant. Hypernatremia. - continue free water - decrease D5W - renal following  GI bleed 10/03. - Protonix  Hypertension, HLD. - lopressor, pravachol - goal SBP < 170  Anemia of critical illness, chronic disease, and GI bleed. Thrombocytopenia. - f/u CBC  DVT prophylaxis - SCDs SUP - Protonix Nutrition - tube feeds Goals of care - full code  Updated pt's wife at bedside  CC time 31 minutes  Chesley Mires, MD [facility] 10/31/2016, 12:01 PM Pager:  [phone] After 3pm call: [phone]

## 2016-10-31 NOTE — Progress Notes (Signed)
Pt seen and examined. No issues overnight.  EXAM: Temp:  [99.3 F (37.4 C)-100.4 F (38 C)] 100.2 F (37.9 C) (10/07 0824) Pulse Rate:  [77-97] 85 (10/07 0700) Resp:  [15-19] 18 (10/07 0700) BP: (112-174)/(61-85) 132/67 (10/07 0700) SpO2:  [99 %-100 %] 99 % (10/07 0700) FiO2 (%):  [30 %] 30 % (10/07 0343) Weight:  [89.7 kg (197 lb 12 oz)] 89.7 kg (197 lb 12 oz) (10/07 0500) Intake/Output      10/06 0701 - 10/07 0700 10/07 0701 - 10/08 0700   I.V. (mL/kg) 850.7 (9.5)    NG/GT 600    IV Piggyback 660    Total Intake(mL/kg) 2110.7 (23.5)    Urine (mL/kg/hr) 2000 (0.9)    Total Output 2000     Net +110.7          Stool Occurrence 1 x     Intubated [place] easily PERRL Localizes x4, left hemiparesis  Stable/improving Continue current care

## 2016-11-01 ENCOUNTER — Inpatient Hospital Stay (HOSPITAL_COMMUNITY): Payer: Medicare HMO

## 2016-11-01 LAB — MAGNESIUM
MAGNESIUM: 1.8 mg/dL (ref 1.7–2.4)
Magnesium: 1.9 mg/dL (ref 1.7–2.4)

## 2016-11-01 LAB — AMMONIA: AMMONIA: 86 umol/L — AB (ref 9–35)

## 2016-11-01 LAB — CBC
HCT: 29.4 % — ABNORMAL LOW (ref 39.0–52.0)
HEMOGLOBIN: 8.9 g/dL — AB (ref 13.0–17.0)
MCH: 27.8 pg (ref 26.0–34.0)
MCHC: 30.3 g/dL (ref 30.0–36.0)
MCV: 91.9 fL (ref 78.0–100.0)
Platelets: 117 10*3/uL — ABNORMAL LOW (ref 150–400)
RBC: 3.2 MIL/uL — ABNORMAL LOW (ref 4.22–5.81)
RDW: 13.8 % (ref 11.5–15.5)
WBC: 4.7 10*3/uL (ref 4.0–10.5)

## 2016-11-01 LAB — GLUCOSE, CAPILLARY
GLUCOSE-CAPILLARY: 159 mg/dL — AB (ref 65–99)
GLUCOSE-CAPILLARY: 199 mg/dL — AB (ref 65–99)
GLUCOSE-CAPILLARY: 287 mg/dL — AB (ref 65–99)
Glucose-Capillary: 181 mg/dL — ABNORMAL HIGH (ref 65–99)
Glucose-Capillary: 250 mg/dL — ABNORMAL HIGH (ref 65–99)
Glucose-Capillary: 236 mg/dL — ABNORMAL HIGH (ref 65–99)

## 2016-11-01 LAB — BASIC METABOLIC PANEL
ANION GAP: 7 (ref 5–15)
BUN: 29 mg/dL — AB (ref 6–20)
CALCIUM: 8.4 mg/dL — AB (ref 8.9–10.3)
CO2: 24 mmol/L (ref 22–32)
Chloride: 117 mmol/L — ABNORMAL HIGH (ref 101–111)
Creatinine, Ser: 1.14 mg/dL (ref 0.61–1.24)
GFR calc Af Amer: >60 mL/min (ref >60)
GLUCOSE: 191 mg/dL — AB (ref 65–99)
Potassium: 3.7 mmol/L (ref 3.5–5.1)
Sodium: 148 mmol/L — ABNORMAL HIGH (ref 135–145)

## 2016-11-01 LAB — TACROLIMUS LEVEL: Tacrolimus (FK506) - LabCorp: 4.8 ng/mL (ref 2.0–20.0)

## 2016-11-01 LAB — VALPROIC ACID LEVEL: VALPROIC ACID LVL: 48 ug/mL — AB (ref 50.0–100.0)

## 2016-11-01 LAB — TRIGLYCERIDES: TRIGLYCERIDES: 151 mg/dL — AB (ref <150)

## 2016-11-01 LAB — PHOSPHORUS
Phosphorus: 2.8 mg/dL (ref 2.5–4.6)
Phosphorus: 2.3 mg/dL — ABNORMAL LOW (ref 2.5–4.6)

## 2016-11-01 MED ORDER — LACTULOSE 10 GM/15ML PO SOLN
30.0000 g | Freq: Three times a day (TID) | ORAL | Status: DC
  Administered 2016-11-01 – 2016-11-02 (×2): 30 g via ORAL
  Filled 2016-11-01 (×2): qty 45

## 2016-11-01 MED ORDER — CHLORHEXIDINE GLUCONATE CLOTH 2 % EX PADS
6.0000 | MEDICATED_PAD | Freq: Every day | CUTANEOUS | Status: DC
  Administered 2016-11-02 – 2016-11-14 (×13): 6 via TOPICAL

## 2016-11-01 MED ORDER — FREE WATER
300.0000 mL | Status: DC
  Administered 2016-11-01 (×2): 300 mL

## 2016-11-01 MED ORDER — FREE WATER
150.0000 mL | Freq: Four times a day (QID) | Status: DC
  Administered 2016-11-02 (×2): 150 mL

## 2016-11-01 MED ORDER — VITAL AF 1.2 CAL PO LIQD
1000.0000 mL | ORAL | Status: DC
  Administered 2016-11-01 – 2016-11-08 (×9): 1000 mL
  Filled 2016-11-01 (×3): qty 1000

## 2016-11-01 MED ORDER — DEXAMETHASONE SODIUM PHOSPHATE 4 MG/ML IJ SOLN
4.0000 mg | Freq: Four times a day (QID) | INTRAMUSCULAR | Status: DC
  Administered 2016-11-01 – 2016-11-05 (×16): 4 mg via INTRAVENOUS
  Filled 2016-11-01 (×16): qty 1

## 2016-11-01 MED ORDER — GADOBENATE DIMEGLUMINE 529 MG/ML IV SOLN
10.0000 mL | Freq: Once | INTRAVENOUS | Status: AC | PRN
  Administered 2016-11-01: 10 mL via INTRAVENOUS

## 2016-11-01 MED ORDER — HYDRALAZINE HCL 20 MG/ML IJ SOLN
10.0000 mg | INTRAMUSCULAR | Status: DC | PRN
  Administered 2016-11-01 – 2016-11-02 (×5): 10 mg via INTRAVENOUS
  Filled 2016-11-01 (×5): qty 1

## 2016-11-01 NOTE — Progress Notes (Signed)
Nutrition Follow Up Note   DOCUMENTATION CODES:   Not applicable  INTERVENTION:   Recommend Vital AF 1.2 @ goal rate of 23ml/hr.   Free water flushes 124ml q 6 hrs   Regimen provides 1872kcal/day, 117g/day, 1832ml/day free water  Pt is at high refeeding risk; recommend monitor K, Mg, P for 3 days after tube feed initiation.      NUTRITION DIAGNOSIS:   Inadequate oral intake related to inability to eat, lethargy/confusion as evidenced by NPO status.  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 He also has a prior history of renal cancer. 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  Pt s/p craniotomy 10/5. Pt continues to be intubated. Pt with Cortrak tube placed 10/6. Pt is tolerating tube feeds well. Pt with 6lb weight loss since admit; patient was > 10 days with no nutrition.   Medications reviewed and include: Vitamin D, dexamethasone, insulin, protonix  Labs reviewed: Na 148(H), Cl 117(H), BUN 29(H), Ca 8.4(L), P 2.8 wnl, Mg 1.9 wnl Hgb 8.9(L), Hct 29.4(L) cbgs- 151, 135, 191 x 48hrs  Patient is currently intubated on ventilator support MV: 10.8 L/min Temp (24hrs), Avg:100.5 F (38.1 C), Min:98.9 F (37.2 C), Max:102 F (38.9 C)  Propofol: none   MAP- >88mmHg  Diet Order:   NPO  Skin:  Reviewed, no issues  Last BM:  10/7- type 7  Height:   Ht Readings from Last 1 Encounters:  11/05/2016 5\' 10"  (1.778 m)    Weight:   Wt Readings from Last 1 Encounters:  11/01/16 192 lb 0.3 oz (87.1 kg)    Ideal Body Weight:  75.5  kg  BMI:  Body mass index is 27.55 kg/m.  Estimated Nutritional Needs:   Kcal:  1942kcal/day   Protein:  106-124g/day   Fluid:  >2L/day   EDUCATION NEEDS:   Education needs no appropriate at this time  Koleen Distance MS, RD, [place] Pager #[phone] After Hours Pager: [phone]

## 2016-11-01 NOTE — Progress Notes (Signed)
Pt seen and examined. No issues overnight.  EXAM: Temp:  [98.9 F (37.2 C)-102 F (38.9 C)] 98.9 F (37.2 C) (10/08 0800) Pulse Rate:  [71-105] 76 (10/08 0900) Resp:  [18-32] 32 (10/08 0900) BP: (114-182)/(59-88) 159/76 (10/08 0900) SpO2:  [96 %-100 %] 96 % (10/08 0900) FiO2 (%):  [30 %-40 %] 30 % (10/08 0808) Weight:  [87.1 kg (192 lb 0.3 oz)] 87.1 kg (192 lb 0.3 oz) (10/08 0345) Intake/Output      10/07 0701 - 10/08 0700 10/08 0701 - 10/09 0700   I.V. (mL/kg) 148.4 (1.7) 20 (0.2)   NG/GT 1924 80   IV Piggyback 725 107.5   Total Intake(mL/kg) 2797.4 (32.1) 207.5 (2.4)   Urine (mL/kg/hr) 1850 (0.9) 150 (0.5)   Total Output 1850 150   Net +947.4 +57.5        Stool Occurrence 1 x     Intubated PERRL Withdraws right, plegic left Incision with dried blood but looks good  CT Head: expected post-operative changes  Poor neurological function No mass effect or midline shift on CT scan Suspect epileptic etiology of AMS No further role for neurosurgery, will follow from [facility] for prophylactic anticoagulation

## 2016-11-01 NOTE — Procedures (Signed)
ELECTROENCEPHALOGRAM REPORT  Date of Study: 11/01/2016  Patient's Name: Roger Gibson MRN: [id number] Date of Birth: 1945-12-14  Referring Provider: Dr. Roland Rack  Clinical History: This is a 71 year old man with frontal lobe mass s/p craniotomy with lethargy  Medications: valproate (DEPACON) 500 mg in dextrose 5 % 50 mL IVPB  lacosamide (VIMPAT) 200 mg in sodium chloride 0.9 % 25 mL IVPB  levETIRAcetam (KEPPRA) 500 mg in sodium chloride 0.9 % 100 mL IVPB  acetaminophen (TYLENOL) tablet 650 mg  albuterol (PROVENTIL) (2.5 MG/3ML) 0.083% nebulizer solution 2.5 mg  bethanechol (URECHOLINE) tablet 10 mg  bisacodyl (DULCOLAX) suppository 10 mg  camphor-menthol (SARNA) lotion  chlorhexidine gluconate (MEDLINE KIT) (PERIDEX) 0.12 % solution 15 mL  Chlorhexidine Gluconate Cloth 2 % PADS 6 each  cholecalciferol (VITAMIN D) tablet 2,000 Units  dexamethasone (DECADRON) injection 1 mg  feeding supplement (PRO-STAT SUGAR FREE 64) liquid 30 mL  feeding supplement (VITAL HIGH PROTEIN) liquid 1,000 mL  free water 300 mL  HYDROmorphone (DILAUDID) injection 0.5-1 mg  insulin aspart (novoLOG) injection 0-9 Units  labetalol (NORMODYNE,TRANDATE) injection 10-40 mg  levalbuterol (XOPENEX) nebulizer solution 0.63 mg  metoprolol tartrate (LOPRESSOR) 25 mg/10 mL oral suspension 50 mg  mycophenolate (CELLCEPT) oral suspension 50 mg/mL  naloxone (NARCAN) injection 0.08 mg  ondansetron (ZOFRAN) injection 4 mg  pantoprazole sodium (PROTONIX) 40 mg/20 mL oral suspension 40 mg  pravastatin (PRAVACHOL) tablet 20 mg  promethazine (PHENERGAN) tablet 12.5-25 mg  propofol (DIPRIVAN) 1000 MG/100ML infusion  senna-docusate (Senokot-S) tablet 1 tablet  tacrolimus (PROGRAF) capsule 1.5 mg  tacrolimus (PROGRAF) capsule 3 mg   Technical Summary: A multichannel digital EEG recording measured by the international 10-20 system with electrodes applied with paste and impedances below 5000 ohms performed as  portable with EKG monitoring in intubated and unresponsive patient.  Hyperventilation and photic stimulation were not performed.  The digital EEG was referentially recorded, reformatted, and digitally filtered in a variety of bipolar and referential montages for optimal display.   Description: The patient is intubated and unresponsive during the recording. No sedating medications listed. There is no clear posterior dominant rhythm seen. The background consists of a large amount of diffuse 4-5 Hz theta and 2-3 Hz delta slowing of the background. Triphasic waves are seen with anteroposterior gradient, slightly higher voltage over the left central region. During drowsiness and sleep, there is an increase in theta and delta slowing of the background with poorly formed vertex waves seen.  Hyperventilation and photic stimulation were not performed.  There were no epileptiform discharges or electrographic seizures seen.    EKG lead was unremarkable.  Impression: This EEG is abnormal due to the presence of: 1. Moderate diffuse background slowing  2. Triphasic waves 3. Breach artifact with higher voltage over the left central region  Clinical Correlation of the above findings indicates diffuse cerebral dysfunction that is non-specific in etiology and can be seen with hypoxic/ischemic injury, toxic/metabolic encephalopathies, or medication effect. Triphasic waves are typically seen with hepatic encephalopathy but can be seen with other metabolic encephalopathies as well. Breach artifact consistent with craniotomy. The absence of epileptiform discharges does not rule out a clinical diagnosis of epilepsy.  Clinical correlation is advised.   Ellouise Newer, M.D.

## 2016-11-01 NOTE — Progress Notes (Signed)
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 Baseline creatinine 1.5 and 1.7. Pred/prograf/myfortic. 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 Presented to ED with left-sided weakness, admitted with sz- brain imaging possible/probable frontal lobe brain mass. Underwent a craniotomy and biopsy 10/5. Poor neurological function noted and CT head demonstrating post op changes. RUE brachial vein DVT noted for arm edema   Objective:  Vital signs in last 24 hours:  Temp:  [98.9 F (37.2 C)-102 F (38.9 C)] 98.9 F (37.2 C) (10/08 0800) Pulse Rate:  [71-105] 80 (10/08 1000) Resp:  [18-33] 33 (10/08 1000) BP: (114-182)/(59-88) 160/74 (10/08 1000) SpO2:  [96 %-100 %] 96 % (10/08 1000) FiO2 (%):  [30 %-40 %] 30 % (10/08 0808) Weight:  [192 lb 0.3 oz (87.1 kg)] 192 lb 0.3 oz (87.1 kg) (10/08 0345)  Weight change: -5 lb 11.7 oz (-2.6 kg) Filed Weights   10/30/16 0417 10/31/16 0500 11/01/16 0345  Weight: 195 lb 15.8 oz (88.9 kg) 197 lb 12 oz (89.7 kg) 192 lb 0.3 oz (87.1 kg)    Intake/Output: I/O last 3 completed shifts: In: 3412.3 [I.V.:460.8; NG/GT:1924; IV Piggyback:1027.5] Out: 2825 [Urine:2825]   Intake/Output this shift:  Total I/O In: 292.5 [I.V.:20; NG/GT:120; IV Piggyback:152.5] Out: 150 [Urine:150]  CVS- RRR RS- CTA ABD- BS present soft non-distended EXT- edema noted RUE  With DVT on ultrasound    Basic Metabolic Panel:  Recent Labs Lab 10/26/16 0144 11/21/2016 0516 11/19/2016 0213 11/06/2016 0716 10/30/16 0415 10/31/16 0232 10/31/16 1419 11/01/16 0340  NA 155* 155* 156* 151* 152*  149* 148*  --  148*  K 3.8 3.4* 4.0 4.1 3.5  3.5 3.6  --  3.7  CL 120* 121* 123* 121*  121*  119* 118*  --  117*  CO2 26 24 22 25 23  23 24   --  24  GLUCOSE 221* 198* 263* 190* 151*  154* 135*  --  191*  BUN 34* 30* 37* 34* 38*  36* 26*  --  29*  CREATININE 1.71* 1.34* 1.53* 1.64* 1.49*  1.52* 1.33*  --  1.14  CALCIUM 10.1 10.0 9.4 9.2 8.6*  8.5* 8.4*  --  8.4*  MG 2.0 1.7  --   --   --   --  1.7 1.9  PHOS 2.2* 2.5 3.3  --  2.7 2.5 2.1* 2.8    Liver Function Tests:  Recent Labs Lab 10/26/16 0144 11/22/2016 0516 11/08/2016 0213 10/30/16 0415 10/31/16 0232  AST 113* 66*  --   --   --   ALT 62 52  --   --   --   ALKPHOS 56 62  --   --   --   BILITOT 0.8 0.9  --   --   --   PROT 6.9 6.1*  --   --   --   ALBUMIN 2.8* 2.5* 2.5* 2.1* 2.1*   No results for input(s): LIPASE, AMYLASE in the last 168 hours. No results for input(s): AMMONIA in the last 168 hours.  CBC:  Recent Labs Lab 10/26/16 0144 11/09/2016 0516 11/07/2016 0213  11/06/2016 2122 10/30/2016 0222 10/30/16 0415 10/31/16 0232 11/01/16 0340  WBC 8.4 5.5 3.8*  < > 3.3* 3.7* 3.0* 4.6 4.7  NEUTROABS 7.2 4.8 3.1  --   --   --   --   --   --  HGB 14.2 13.4 13.5  < > 12.4* 12.0* 9.6* 9.5* 8.9*  HCT 43.9 42.4 42.5  < > 40.0 39.6 30.9* 30.4* 29.4*  MCV 89.2 88.9 91.2  < > 92.2 92.5 91.7 91.6 91.9  PLT 143* 124* 98*  < > 87* 83* 121* 104* 117*  < > = values in this interval not displayed.  Cardiac Enzymes: No results for input(s): CKTOTAL, CKMB, CKMBINDEX, TROPONINI in the last 168 hours.  BNP: Invalid input(s): POCBNP  CBG:  Recent Labs Lab 10/31/16 1613 10/31/16 1936 10/31/16 2324 11/01/16 0405 11/01/16 0832  GLUCAP 139* 138* 163* 159* 181*    Microbiology: Results for orders placed or performed during the hospital encounter of 10/06/2016  MRSA PCR Screening     Status: None   Collection Time: 10/23/16  1:58 AM  Result Value Ref Range Status   MRSA by PCR NEGATIVE NEGATIVE Final    Comment:        The GeneXpert MRSA Assay (FDA approved for NASAL specimens only), is one component of  a comprehensive MRSA colonization surveillance program. It is not intended to diagnose MRSA infection nor to guide or monitor treatment for MRSA infections.   Culture, Urine     Status: Abnormal   Collection Time: 10/23/16 11:12 AM  Result Value Ref Range Status   Specimen Description URINE, RANDOM  Final   Special Requests NONE  Final   Culture MULTIPLE SPECIES PRESENT, SUGGEST RECOLLECTION (A)  Final   Report Status 10/24/2016 FINAL  Final  Culture, blood (Routine X 2) w Reflex to ID Panel     Status: None   Collection Time: 10/25/16 12:04 PM  Result Value Ref Range Status   Specimen Description BLOOD LEFT HAND  Final   Special Requests IN PEDIATRIC BOTTLE Blood Culture adequate volume  Final   Culture NO GROWTH 5 DAYS  Final   Report Status 10/30/2016 FINAL  Final  Culture, blood (Routine X 2) w Reflex to ID Panel     Status: None   Collection Time: 10/25/16 12:04 PM  Result Value Ref Range Status   Specimen Description BLOOD LEFT HAND  Final   Special Requests IN PEDIATRIC BOTTLE Blood Culture adequate volume  Final   Culture NO GROWTH 5 DAYS  Final   Report Status 10/30/2016 FINAL  Final  Acid Fast Smear (AFB)     Status: None   Collection Time: 11/06/2016  3:18 PM  Result Value Ref Range Status   AFB Specimen Processing Comment  Final    Comment: Tissue Grinding and Digestion/Decontamination   Acid Fast Smear Negative  Final    Comment: (NOTE) Performed At: [facility] [address] [place], [place] [id number] Lindon Romp MD WU:[id number]    Source (AFB) TISSUE  Final    Coagulation Studies: No results for input(s): LABPROT, INR in the last 72 hours.  Urinalysis: No results for input(s): COLORURINE, LABSPEC, PHURINE, GLUCOSEU, HGBUR, BILIRUBINUR, KETONESUR, PROTEINUR, UROBILINOGEN, NITRITE, LEUKOCYTESUR in the last 72 hours.  Invalid input(s): APPERANCEUR    Imaging: Ct Head Wo Contrast  Result Date: 10/31/2016 CLINICAL DATA:  Altered level of  consciousness. EXAM: CT HEAD WITHOUT CONTRAST TECHNIQUE: Contiguous axial images were obtained from the base of the skull through the vertex without intravenous contrast. COMPARISON:  MR brain without contrast 11/09/2016. MRI brain with contrast 11/06/2016. CT head without contrast 10/22/2016. FINDINGS: Brain: Posterior right frontal craniotomy is noted. Gas and fluid are present within the resection cavity in the posterior right frontal lobe.  This corresponds to the very of neoplasm. A small amount of extra-axial hemorrhage overlies the resection cavity. Sulcal effacement is slightly improved following surgery. No acute infarct, hemorrhage, or mass lesion is present elsewhere. Ventricles are of normal size. The brainstem and cerebellum are normal. Vascular: Minimal vascular calcifications are present within the cavernous internal carotid arteries. There is no hyperdense vessel. Skull: Right frontal craniotomy is noted. Calvarium is otherwise intact. Sinuses/Orbits: Minimal fluid is present within the anterior left ethmoid air cells. Bilateral maxillary antrostomies are noted. The paranasal sinuses are otherwise clear. Mastoid air cells are clear. The globes and orbits are within normal limits bilaterally. IMPRESSION: 1. Right frontal craniotomy with expected changes in the surgical cavity. 2. Minimal extra-axial hemorrhage overlying the surgical cavity, within normal limits. 3. Slight reduction in mass effect. 4. No evidence for complication on CT. No acute intracranial abnormality. Electronically Signed   By: San Morelle M.D.   On: 10/31/2016 13:36   Dg Chest Port 1 View  Result Date: 11/01/2016 CLINICAL DATA:  Respiratory failure. EXAM: PORTABLE CHEST 1 VIEW COMPARISON:  11/22/2016 FINDINGS: Endotracheal tube terminates 3 cm above the carina. Enteric tube courses into the stomach with tip not imaged. A left brachiocephalic vein stent is noted. The cardiomediastinal silhouette is unchanged. Lung  volumes are mildly lower than on the prior study with mildly increased opacity in the left lung base. Minimal right basilar opacity is similar to the prior study. A trace left pleural effusion is not excluded. No pneumothorax is identified. IMPRESSION: 1. Low lung volumes with mildly increased left basilar opacity, likely atelectasis. 2. Possible trace left pleural effusion. Electronically Signed   By: Logan Bores M.D.   On: 11/01/2016 07:20   Dg Abd Portable 1v  Result Date: 10/30/2016 CLINICAL DATA:  Feeding tube placement EXAM: PORTABLE ABDOMEN - 1 VIEW COMPARISON:  10/26/2016 FINDINGS: Interval placement of a feeding tube with the tip projecting over the antrum of the stomach. There is no bowel dilatation to suggest obstruction. There is no evidence of pneumoperitoneum, portal venous gas or pneumatosis. There are no pathologic calcifications along the expected course of the ureters. The osseous structures are unremarkable. IMPRESSION: Interval placement of a feeding tube with the tip projecting over the antrum of the stomach. Electronically Signed   By: Kathreen Devoid   On: 10/30/2016 17:29     Medications:   . lacosamide (VIMPAT) IV 200 mg (11/01/16 1038)  . levETIRAcetam Stopped (11/01/16 0417)  . levETIRAcetam Stopped (11/01/16 0915)  . propofol (DIPRIVAN) infusion Stopped (10/31/16 0700)  . valproate sodium Stopped (11/01/16 0610)   . bethanechol  10 mg Oral TID  . chlorhexidine gluconate (MEDLINE KIT)  15 mL Mouth Rinse BID  . [START ON 11/02/2016] Chlorhexidine Gluconate Cloth  6 each Topical Q0600  . cholecalciferol  2,000 Units Oral Daily  . dexamethasone  4 mg Intravenous Q6H  . feeding supplement (PRO-STAT SUGAR FREE 64)  30 mL Per Tube BID  . feeding supplement (VITAL HIGH PROTEIN)  1,000 mL Per Tube Q24H  . free water  300 mL Per Tube Q4H  . insulin aspart  0-9 Units Subcutaneous Q4H  . mouth rinse  15 mL Mouth Rinse 10 times per day  . metoprolol tartrate  50 mg Per Tube BID   . mycophenolate  250 mg Oral BID  . pantoprazole sodium  40 mg Per Tube Q24H  . pravastatin  20 mg Per Tube q1800  . sodium chloride flush  10-40 mL Intracatheter Q12H  .  sodium chloride flush  3 mL Intravenous Q12H  . tacrolimus  3 mg Oral BID   Or  . tacrolimus  1.5 mg Sublingual BID   acetaminophen **OR** acetaminophen, albuterol, bisacodyl, camphor-menthol, HYDROmorphone (DILAUDID) injection, labetalol, levalbuterol, naLOXone (NARCAN)  injection, [DISCONTINUED] ondansetron **OR** ondansetron (ZOFRAN) IV, promethazine, senna-docusate, sodium chloride flush  Assessment/ Plan:  1. Frontal lobe lesion/left sided weakness/lethargy - s/p craniotomy. Path and cultures/smears pending 2. Vent - per CCM 3. Renal xpl #2 - BL creatinine 1.5-1.7. AKI on CKD (creatinine 2.29 on admission) on setting of hospitalization for possibly stroke/seizure/possible brain tumor with decreased mental status.  Creatinine at baseline. Getting IV steroids (currently decadron for brain issues so pred on hold) and IV mycophenolate, SL prograf at 1/2 dose. TAC trough ^ 10.3 on 9/30 (did not come back until 10/2) Reduced prograf dosing to 3 mg BID 10/2, and 1.5 mg BID 10/4 (SL); creatinine is stable  1. Repeat TAC level pending from this am 10/7 for further dose adjustment 4. Metabolic acidosis - resolved 5. Hypokalemia - replete prn 6. Hypernatremia - Getting free water via OGT. Slow improvement still 148  7. Systolic and diastolic HF 8. HTN - currently IV metoprolol 9. Assymetric RUE edema - [place] UE today brachial vein DVT (no extension into axillary v) -CCM aware.  I would presume heparin contraindicated in light of recent craniotomy 10. Post op Hb variation - Hb 12->9.6. Trend   LOS: 11 Kaybree Williams W @TODAY @11 :08 AM

## 2016-11-01 NOTE — Progress Notes (Signed)
Subjective: Patient intubated, no response to noxious stimuli, breathing over the vent. No blink to threat. Neurology called back due to patient not becoming responsive. Of note patient was diagnosed with right arm DVT recently.   Exam: Vitals:   11/01/16 1200 11/01/16 1300  BP: (!) 152/68 (!) 157/68  Pulse: 80 81  Resp: 20 18  Temp: (!) 101.4 F (38.6 C)   SpO2: 96% 97%    HEENT-  Normocephalic, no lesions, without obvious abnormality.  Normal external eye and conjunctiva.  Normal TM's bilaterally.  Normal auditory canals and external ears. Normal external nose, mucus membranes and septum.  Normal pharynx. Cardiovascular- S1, S2 normal, pulses palpable throughout   Extremities: bilateral arms show pitting edema and taut    Neuro:  CN: Pupils are equal and round. They are symmetrically reactive from 3-->2 mm. EOMI without nystagmus. When eyes are open to vocal stimuli right eye has ptosis, no response to noxious stimuli in face or trapezius.  Face is symmetric at rest with normal strength and mobility.   Motor: does not move extremities spontanious  Sensation:  No response to pain.  DTRs: none      Pertinent Labs/Diagnostics: NA 148 Glucose 191 EEG: Impression: This EEG is abnormal due to the presence of: 1. Moderate diffuse background slowing  2. Triphasic waves 3. Breach artifact with higher voltage over the left central region    Etta Quill PA-C [facility] [phone]  Impression: 71 YO male with frontal lobe lesion--S/P biopsy (pathology pending). Currently in a obtunded state with no clear cause   Recommendations: 1) EEG LTM 2) MRI with and without contrast 3) Ammonia 4) Depakote level  11/01/2016, 2:09 PM   ATTENDING ADDENDUM Agree with above. Will follow.  Amie Portland, MD [facility] [phone]  If 7pm to 7am, please call on call as listed on AMION.

## 2016-11-01 NOTE — Progress Notes (Addendum)
PULMONARY / CRITICAL CARE MEDICINE   Name: Roger Gibson MRN: [id number] DOB: 1945-02-15    ADMISSION DATE:  09/27/2016 CONSULTATION DATE:  11/10/2016  REFERRING MD: Dr. Wynelle Cleveland, [facility]  CHIEF COMPLAINT:  Lt arm weakness  HISTORY OF PRESENT ILLNESS:   71 yo male with Lt arm weakness and found to have frontal lobe mass.  Had craniotomy 10/05 and remained on vent post op.  PMHx of ESRD, HTN, combined CHF, Rt renal cell cancer s/p transplant.  SUBJECTIVE:  No pressors HTN  VITAL SIGNS: BP (!) 159/76   Pulse 76   Temp 98.9 F (37.2 C) (Axillary)   Resp (!) 32   Ht 5\' 10"  (1.778 m)   Wt 87.1 kg (192 lb 0.3 oz)   SpO2 96%   BMI 27.55 kg/m   VENTILATOR SETTINGS: Vent Mode: PSV;CPAP FiO2 (%):  [30 %-40 %] 30 % Set Rate:  [18 bmp] 18 bmp Vt Set:  [610 mL] 610 mL PEEP:  [5 cmH20] 5 cmH20 Pressure Support:  [12 cmH20] 12 cmH20 Plateau Pressure:  [16 cmH20-19 cmH20] 16 cmH20  INTAKE / OUTPUT: I/O last 3 completed shifts: In: 3412.3 [I.V.:460.8; NG/GT:1924; IV Piggyback:1027.5] Out: 2825 [Urine:2825]  PHYSICAL EXAMINATION:  General: not awake Neuro: opens eyes int, not moving left, wd on rt, perr HEENT: ett PULM: ronchi CV:  s1 s2 RRR GI: soft, bs wnl, no r Extremities: edema 1   LABS:  BMET  Recent Labs Lab 10/30/16 0415 10/31/16 0232 11/01/16 0340  NA 152*  149* 148* 148*  K 3.5  3.5 3.6 3.7  CL 121*  119* 118* 117*  CO2 23  23 24 24   BUN 38*  36* 26* 29*  CREATININE 1.49*  1.52* 1.33* 1.14  GLUCOSE 151*  154* 135* 191*    Electrolytes  Recent Labs Lab 11/20/2016 0516  10/30/16 0415 10/31/16 0232 10/31/16 1419 11/01/16 0340  CALCIUM 10.0  < > 8.6*  8.5* 8.4*  --  8.4*  MG 1.7  --   --   --  1.7 1.9  PHOS 2.5  < > 2.7 2.5 2.1* 2.8  < > = values in this interval not displayed.  CBC  Recent Labs Lab 10/30/16 0415 10/31/16 0232 11/01/16 0340  WBC 3.0* 4.6 4.7  HGB 9.6* 9.5* 8.9*  HCT 30.9* 30.4* 29.4*  PLT 121* 104* 117*     Coag's No results for input(s): APTT, INR in the last 168 hours.  Sepsis Markers No results for input(s): LATICACIDVEN, PROCALCITON, O2SATVEN in the last 168 hours.  ABG  Recent Labs Lab 11/23/2016 1928  PHART 7.333*  PCO2ART 44.0  PO2ART 191*    Liver Enzymes  Recent Labs Lab 10/26/16 0144 10/25/2016 0516 10/31/2016 0213 10/30/16 0415 10/31/16 0232  AST 113* 66*  --   --   --   ALT 62 52  --   --   --   ALKPHOS 56 62  --   --   --   BILITOT 0.8 0.9  --   --   --   ALBUMIN 2.8* 2.5* 2.5* 2.1* 2.1*    Cardiac Enzymes No results for input(s): TROPONINI, PROBNP in the last 168 hours.  Glucose  Recent Labs Lab 10/31/16 1157 10/31/16 1613 10/31/16 1936 10/31/16 2324 11/01/16 0405 11/01/16 0832  GLUCAP 124* 139* 138* 163* 159* 181*    Imaging Ct Head Wo Contrast  Result Date: 10/31/2016 CLINICAL DATA:  Altered level of consciousness. EXAM: CT HEAD WITHOUT CONTRAST TECHNIQUE: Contiguous axial images  were obtained from the base of the skull through the vertex without intravenous contrast. COMPARISON:  MR brain without contrast 10/25/2016. MRI brain with contrast 11/18/2016. CT head without contrast 10/22/2016. FINDINGS: Brain: Posterior right frontal craniotomy is noted. Gas and fluid are present within the resection cavity in the posterior right frontal lobe. This corresponds to the very of neoplasm. A small amount of extra-axial hemorrhage overlies the resection cavity. Sulcal effacement is slightly improved following surgery. No acute infarct, hemorrhage, or mass lesion is present elsewhere. Ventricles are of normal size. The brainstem and cerebellum are normal. Vascular: Minimal vascular calcifications are present within the cavernous internal carotid arteries. There is no hyperdense vessel. Skull: Right frontal craniotomy is noted. Calvarium is otherwise intact. Sinuses/Orbits: Minimal fluid is present within the anterior left ethmoid air cells. Bilateral maxillary  antrostomies are noted. The paranasal sinuses are otherwise clear. Mastoid air cells are clear. The globes and orbits are within normal limits bilaterally. IMPRESSION: 1. Right frontal craniotomy with expected changes in the surgical cavity. 2. Minimal extra-axial hemorrhage overlying the surgical cavity, within normal limits. 3. Slight reduction in mass effect. 4. No evidence for complication on CT. No acute intracranial abnormality. Electronically Signed   By: San Morelle M.D.   On: 10/31/2016 13:36   Dg Chest Port 1 View  Result Date: 11/01/2016 CLINICAL DATA:  Respiratory failure. EXAM: PORTABLE CHEST 1 VIEW COMPARISON:  11/23/2016 FINDINGS: Endotracheal tube terminates 3 cm above the carina. Enteric tube courses into the stomach with tip not imaged. A left brachiocephalic vein stent is noted. The cardiomediastinal silhouette is unchanged. Lung volumes are mildly lower than on the prior study with mildly increased opacity in the left lung base. Minimal right basilar opacity is similar to the prior study. A trace left pleural effusion is not excluded. No pneumothorax is identified. IMPRESSION: 1. Low lung volumes with mildly increased left basilar opacity, likely atelectasis. 2. Possible trace left pleural effusion. Electronically Signed   By: Logan Bores M.D.   On: 11/01/2016 07:20     STUDIES:  EEG 9/27 >> lateralized periodic discharge Rt posterior quadrant with delta morphology Echo 9/30 >> EF 55 to 60%, mod LVH, grade 1 DD, mild MR  CULTURES: Blood 10/01 >> negative Brain 10/05 >>  ANTIBIOTICS:  SIGNIFICANT EVENTS: 09/27 Admit 10/05 Craniotomy  LINES/TUBES: ETT 10/05 >>  Lt femoral CVL 10/05 >>  ASSESSMENT / PLAN:  NEURO: Acute encephalopathy with Lt arm weakness. Frontal lobe mass s/p craniotomy. - wound care - AEDs per neurology -follow bx -limit sedation as able -agree decadron- maintain and consider increase with lack of progress -eeg repeat  needed  RESP Compromised airway, Acute resp failure -pcxr shows increase base edema>?, repeat -avoid such pos balance -lasix consideration, will d/w renal, we may need to wait on lasix until Na corrected  ESRD. S/p renal transplant. Hypernatremia. - some reluctance with mass to use d5w -free water to q4h -bme tin am  Lasix will be needed soon, pcxr with edema bases  GI bleed 10/03. - Protonix -TF  Hypertension, HLD. - lopressor, pravachol - goal SBP < 170  Anemia of critical illness, chronic disease, and GI bleed. Thrombocytopenia. DVT rt brachial vein - scd -will d/w NS when can start sub q hep then IV  DVT prophylaxis - SCDs SUP - Protonix Nutrition - tube feeds Goals of care - full code  Dc fem line when able Place IJ if needed  Ccm time 35 min   Lavon Paganini. Titus Mould, MD,  FACP Pgr: [id number]  Pulmonary & Critical Care

## 2016-11-01 NOTE — Progress Notes (Signed)
Pt is hooked up to LTM - no initial skin breakdown

## 2016-11-01 NOTE — Progress Notes (Signed)
[facility]  Ammonia 86 Start lactulose via cor trak  Roselie Awkward, MD [facility] Pager: [phone] Cell: [phone] After 3pm or if no response, call [phone]

## 2016-11-02 ENCOUNTER — Inpatient Hospital Stay (HOSPITAL_COMMUNITY): Payer: Medicare HMO

## 2016-11-02 LAB — BASIC METABOLIC PANEL
ANION GAP: 8 (ref 5–15)
Anion gap: 9 (ref 5–15)
BUN: 33 mg/dL — ABNORMAL HIGH (ref 6–20)
BUN: 32 mg/dL — AB (ref 6–20)
CALCIUM: 8.9 mg/dL (ref 8.9–10.3)
CHLORIDE: 117 mmol/L — AB (ref 101–111)
CO2: 24 mmol/L (ref 22–32)
CO2: 24 mmol/L (ref 22–32)
CREATININE: 1.13 mg/dL (ref 0.61–1.24)
Calcium: 9.2 mg/dL (ref 8.9–10.3)
Chloride: 117 mmol/L — ABNORMAL HIGH (ref 101–111)
Creatinine, Ser: 1 mg/dL (ref 0.61–1.24)
GFR calc Af Amer: >60 mL/min (ref >60)
GFR calc non Af Amer: >60 mL/min (ref >60)
GLUCOSE: 391 mg/dL — AB (ref 65–99)
Glucose, Bld: 339 mg/dL — ABNORMAL HIGH (ref 65–99)
POTASSIUM: 3.5 mmol/L (ref 3.5–5.1)
Potassium: 3.6 mmol/L (ref 3.5–5.1)
Sodium: 149 mmol/L — ABNORMAL HIGH (ref 135–145)
Sodium: 150 mmol/L — ABNORMAL HIGH (ref 135–145)

## 2016-11-02 LAB — AMMONIA: Ammonia: 132 umol/L — ABNORMAL HIGH (ref 9–35)

## 2016-11-02 LAB — GLUCOSE, CAPILLARY
GLUCOSE-CAPILLARY: 303 mg/dL — AB (ref 65–99)
GLUCOSE-CAPILLARY: 283 mg/dL — AB (ref 65–99)
GLUCOSE-CAPILLARY: 378 mg/dL — AB (ref 65–99)
Glucose-Capillary: 339 mg/dL — ABNORMAL HIGH (ref 65–99)
Glucose-Capillary: 366 mg/dL — ABNORMAL HIGH (ref 65–99)
Glucose-Capillary: 378 mg/dL — ABNORMAL HIGH (ref 65–99)

## 2016-11-02 LAB — PROTEIN AND GLUCOSE, CSF
GLUCOSE CSF: 183 mg/dL — AB (ref 40–70)
TOTAL PROTEIN, CSF: 54 mg/dL — AB (ref 15–45)

## 2016-11-02 LAB — CSF CELL COUNT WITH DIFFERENTIAL
RBC Count, CSF: 218 /mm3 — ABNORMAL HIGH
TUBE #: 3
WBC, CSF: 0 /mm3 (ref 0–5)

## 2016-11-02 MED ORDER — HEPARIN SODIUM (PORCINE) 5000 UNIT/ML IJ SOLN
5000.0000 [IU] | Freq: Two times a day (BID) | INTRAMUSCULAR | Status: DC
  Administered 2016-11-02 – 2016-11-09 (×15): 5000 [IU] via SUBCUTANEOUS
  Filled 2016-11-02 (×15): qty 1

## 2016-11-02 MED ORDER — NICARDIPINE HCL IN NACL 20-0.86 MG/200ML-% IV SOLN
3.0000 mg/h | INTRAVENOUS | Status: DC
  Administered 2016-11-02: 7.5 mg/h via INTRAVENOUS
  Administered 2016-11-02: 10 mg/h via INTRAVENOUS
  Administered 2016-11-02: 7.5 mg/h via INTRAVENOUS
  Administered 2016-11-02: 5 mg/h via INTRAVENOUS
  Filled 2016-11-02 (×4): qty 200

## 2016-11-02 MED ORDER — DEXTROSE 5 % IV SOLN
INTRAVENOUS | Status: DC
  Administered 2016-11-02 – 2016-11-06 (×7): via INTRAVENOUS

## 2016-11-02 MED ORDER — POTASSIUM CHLORIDE 20 MEQ PO PACK
40.0000 meq | PACK | Freq: Once | ORAL | Status: AC
  Administered 2016-11-02: 40 meq
  Filled 2016-11-02: qty 2

## 2016-11-02 MED ORDER — LIDOCAINE HCL (PF) 1 % IJ SOLN
INTRAMUSCULAR | Status: AC
  Administered 2016-11-02: 5 mL
  Filled 2016-11-02: qty 5

## 2016-11-02 MED ORDER — INSULIN ASPART 100 UNIT/ML ~~LOC~~ SOLN
4.0000 [IU] | SUBCUTANEOUS | Status: DC
  Administered 2016-11-02 – 2016-11-03 (×5): 4 [IU] via SUBCUTANEOUS

## 2016-11-02 MED ORDER — INSULIN ASPART 100 UNIT/ML ~~LOC~~ SOLN
4.0000 [IU] | SUBCUTANEOUS | Status: DC
  Administered 2016-11-02: 4 [IU] via SUBCUTANEOUS

## 2016-11-02 MED ORDER — NICARDIPINE HCL IN NACL 40-0.83 MG/200ML-% IV SOLN
3.0000 mg/h | INTRAVENOUS | Status: DC
  Administered 2016-11-02: 10 mg/h via INTRAVENOUS
  Administered 2016-11-03: 8.5 mg/h via INTRAVENOUS
  Administered 2016-11-03: 10 mg/h via INTRAVENOUS
  Administered 2016-11-03: 8.5 mg/h via INTRAVENOUS
  Administered 2016-11-03: 5 mg/h via INTRAVENOUS
  Administered 2016-11-03 (×2): 10 mg/h via INTRAVENOUS
  Administered 2016-11-04: 7.5 mg/h via INTRAVENOUS
  Administered 2016-11-04: 10 mg/h via INTRAVENOUS
  Administered 2016-11-04: 5 mg/h via INTRAVENOUS
  Administered 2016-11-04: 9 mg/h via INTRAVENOUS
  Administered 2016-11-05: 12 mg/h via INTRAVENOUS
  Administered 2016-11-05: 9 mg/h via INTRAVENOUS
  Administered 2016-11-06 – 2016-11-08 (×21): 15 mg/h via INTRAVENOUS
  Administered 2016-11-08: 10 mg/h via INTRAVENOUS
  Administered 2016-11-08 – 2016-11-09 (×9): 15 mg/h via INTRAVENOUS
  Administered 2016-11-09 – 2016-11-10 (×2): 12.5 mg/h via INTRAVENOUS
  Administered 2016-11-10: 7.5 mg/h via INTRAVENOUS
  Administered 2016-11-10 (×2): 12.5 mg/h via INTRAVENOUS
  Filled 2016-11-02 (×4): qty 200
  Filled 2016-11-02: qty 800
  Filled 2016-11-02 (×36): qty 200
  Filled 2016-11-02: qty 400
  Filled 2016-11-02 (×3): qty 200
  Filled 2016-11-02: qty 600
  Filled 2016-11-02 (×4): qty 200

## 2016-11-02 NOTE — Progress Notes (Addendum)
PULMONARY / CRITICAL CARE MEDICINE   Name: Roger Gibson MRN: [id number] DOB: [birth year]    ADMISSION DATE:  10/20/2016 CONSULTATION DATE:  11/11/2016  REFERRING MD: Dr. Wynelle Cleveland, [facility]  CHIEF COMPLAINT:  Lt arm weakness  HISTORY OF PRESENT ILLNESS:   71 yo male with Lt arm weakness and found to have frontal lobe mass.  Had craniotomy 10/05 and remained on vent post op.  PMHx of ESRD, HTN, combined CHF, Rt renal cell cancer s/p transplant.  SUBJECTIVE:  Remains on vent with no significant change in neuro status, elevated ammonia today.  This am, on pressure support 12/5 MRI shows post-op changes Fluid balance +206 ml  VITAL SIGNS: BP (!) 179/81   Pulse 75   Temp 99.3 F (37.4 C) (Axillary)   Resp 20   Ht 5\' 10"  (1.778 m)   Wt 200 lb 13.4 oz (91.1 kg)   SpO2 98%   BMI 28.82 kg/m   VENTILATOR SETTINGS: Vent Mode: PSV;CPAP FiO2 (%):  [30 %] 30 % Set Rate:  [18 bmp] 18 bmp Vt Set:  [610 mL] 610 mL PEEP:  [5 cmH20] 5 cmH20 Pressure Support:  [12 cmH20] 12 cmH20 Plateau Pressure:  [17 cmH20-19 cmH20] 18 cmH20  INTAKE / OUTPUT: I/O last 3 completed shifts: In: 3664.5 [I.V.:174.3; NG/GT:2442.8; IV Piggyback:1047.5] Out: 2915 [Urine:2915]  PHYSICAL EXAMINATION:  General: Not alert, on vent  Neuro: Opens eyes to stimulation, no ext movement, perrl  HEENT: ett secured in place PULM: Coarse breath sounds  CV:  RRR, no murmur appreciated  GI: soft, +BS Extremities: Bilateral UE edema    LABS:  BMET  Recent Labs Lab 10/31/16 0232 11/01/16 0340 11/02/16 0430  NA 148* 148* 149*  K 3.6 3.7 3.6  CL 118* 117* 117*  CO2 24 24 24   BUN 26* 29* 33*  CREATININE 1.33* 1.14 1.13  GLUCOSE 135* 191* 339*    Electrolytes  Recent Labs Lab 10/31/16 0232 10/31/16 1419 11/01/16 0340 11/01/16 1456 11/02/16 0430  CALCIUM 8.4*  --  8.4*  --  8.9  MG  --  1.7 1.9 1.8  --   PHOS 2.5 2.1* 2.8 2.3*  --     CBC  Recent Labs Lab 10/30/16 0415 10/31/16 0232  11/01/16 0340  WBC 3.0* 4.6 4.7  HGB 9.6* 9.5* 8.9*  HCT 30.9* 30.4* 29.4*  PLT 121* 104* 117*    Coag's No results for input(s): APTT, INR in the last 168 hours.  Sepsis Markers No results for input(s): LATICACIDVEN, PROCALCITON, O2SATVEN in the last 168 hours.  ABG  Recent Labs Lab 11/02/2016 1928  PHART 7.333*  PCO2ART 44.0  PO2ART 191*    Liver Enzymes  Recent Labs Lab 11/23/2016 0516 11/15/2016 0213 10/30/16 0415 10/31/16 0232  AST 66*  --   --   --   ALT 52  --   --   --   ALKPHOS 62  --   --   --   BILITOT 0.9  --   --   --   ALBUMIN 2.5* 2.5* 2.1* 2.1*    Cardiac Enzymes No results for input(s): TROPONINI, PROBNP in the last 168 hours.  Glucose  Recent Labs Lab 11/01/16 1215 11/01/16 1646 11/01/16 2006 11/01/16 2335 11/02/16 0340 11/02/16 0753  GLUCAP 199* 250* 236* 287* 303* 283*    Imaging Mr Brain W Wo Contrast  Result Date: 11/01/2016 CLINICAL DATA:  Initial evaluation for acute altered mental status status post recent biopsy of right frontal lobe lesion. EXAM:  MRI HEAD WITHOUT AND WITH CONTRAST TECHNIQUE: Multiplanar, multiecho pulse sequences of the brain and surrounding structures were obtained without and with intravenous contrast. CONTRAST:  10 cc of MultiHance. COMPARISON:  Prior MRI from 11/08/2016. FINDINGS: Brain: Postoperative changes from recent posterior right frontal craniotomy for biopsy of previously identified posterior right frontal lobe mass. Heterogeneous T2/FLAIR signal intensity with postoperative blood products present within the resection cavity. Mild diffusion abnormality surrounding the resection cavity felt to be within normal limits for normal expected postoperative changes without evidence for significant ischemia or superimposed seizure activity. Smooth contiguous enhancement about the resection cavity felt to be within normal limits for normal expected postoperative changes. Residual T2/FLAIR signal abnormality within the  adjacent posterior right frontal lesion consistent with known lesion (series 6, image 22). Associated edema relatively similar to preoperative MRI. Small subdural collection overlying the right cerebral convexity measures up to 3 mm in thickness without significant mass effect, felt to be within normal limits for normal expected postoperative changes. Overlying postoperative dural thickening and enhancement. Single punctate diffusion abnormality located more anteriorly within the right frontal lobe favored to reflect small amount of postoperative blood products, possibly within the subarachnoid space, although a tiny punctate cortical infarct is not entirely excluded (series 3, image 36). Otherwise, appearance of the brain is stable. Gray-white matter differentiation otherwise maintained. No other evidence for acute or subacute ischemia. No other mass lesion or abnormal enhancement. Trace right-to-left midline shift of the septum pellucidum without significant midline shift. Basilar cisterns remain patent. Stable ventricular size without hydrocephalus. Pituitary gland suprasellar region normal. Midline structures intact and normal. Vascular: Major intracranial vascular flow voids are maintained. Skull and upper cervical spine: Craniocervical junction within normal limits. Multilevel degenerative spondylolysis noted within the upper cervical spine. Resultant mild spinal stenosis at C2-3. Bone marrow signal intensity is heterogeneous. Post craniotomy changes present at the posterior right frontal calvarium. Overlying skin staples. Sinuses/Orbits: Globes and orbital soft tissues within normal limits. Scattered mucosal thickening noted within the paranasal sinuses. Fluid layering within the posterior nasopharynx noted. Patient is intubated. Small bilateral mastoid effusions noted. Other: None. IMPRESSION: 1. Postoperative changes from recent right frontal craniotomy for biopsy/partial resection of posterior right  frontal lobe mass. No complication. Residual mass surrounds the resection cavity with similar localized edema. 2. Small postoperative extra-axial collection overlying the right cerebral convexity, felt to be within normal limits for normal expected postoperative changes. 3. Punctate diffusion abnormality within the anterior right frontal lobe, suspected to reflect a small amount of postoperative blood products within the subarachnoid space, although a tiny cortical infarct is not excluded. 4. No other acute intracranial abnormality. Electronically Signed   By: Jeannine Boga M.D.   On: 11/01/2016 19:33     STUDIES:  EEG 9/27 >> lateralized periodic discharge Rt posterior quadrant with delta morphology EEG 10/9 >> diffuse encephalopathy, more severe cortical dysfunction of R hemisphere, no seizures   Echo 9/30 >> EF 55 to 60%, mod LVH, grade 1 DD, mild MR  MRI 10/8 >> Post-op changes surrounding biopsy site with residual mass and edema   CULTURES: Blood 10/01 >> negative Brain 10/05 >>   ANTIBIOTICS:  SIGNIFICANT EVENTS: 09/27 Admit 10/05 Craniotomy with biopsy collected 10/8- not waking up, neuro re called  LINES/TUBES: ETT 10/05 >>  Lt femoral CVL 10/05 >>  ASSESSMENT / PLAN:  NEURO: Acute encephalopathy with Lt arm weakness. Frontal lobe mass s/p craniotomy. - wound care - AEDs per neurology - Biopsy pending, f/u result   -  No subclinical seizure activity noted on rpt EEG - Cont Decadron, currently 4 mg IV q6hr - Per Neuro recs, LP today with CSF studies to include JC virus   RESP Compromised airway, Acute resp failure -avoid positive balance to aid in weaning  -pressure support, currently 12/5  -prior CXR with basilar edema  -considering lasix, though pt does have hyperNa, may be able to tolerate given cerebral edema    ESRD. S/p renal transplant. Hypernatremia. -On free water 150 ml q 6hr  -Monitor BMP  GI: GI bleed 10/03. Hyperammonemia- valproate may  be contributing   - Protonix - Continue TF - Lactulose 30 g TID  CV: Hypertension, HLD. - lopressor, pravachol - goal SBP < 160 - difficult access in IJ, unable to relocate femoral line at this time   HEME: Anemia of critical illness, chronic disease, and GI bleed. Thrombocytopenia. DVT rt brachial vein - scd, resume pharm DVT ppx  DVT prophylaxis - SCDs, subq heparin  SUP - Protonix Nutrition - tube feeds, SSI with additional TF coverage during steroids  Goals of care - full code  No family at bedside this morning   STAFF NOTE: I, Merrie Roof, MD FACP have personally reviewed patient's available data, including medical history, events of note, physical examination and test results as part of my evaluation. I have discussed with resident/NP and other care providers such as pharmacist, RN and RRT. In addition, I personally evaluated patient and elicited key findings of: opens eyes to stimulation, no movement ext, perrl, chest is clear faint coarse, abdo sift, edema uppers, limited edema lowers, he remains with ppor mental status, d/w neuro I will LP for JC virus, HTn remains, add cardene drip for control, pcxr does not show edema, hypernatremia, no lasix, consider correction na more aggressive given neurostatus, add d5w at 49 to correct this, EEG done and reviewed , will be read soon, I spoke to NS, can start sub q heparin , NO full systemic heparin for 2 weeks post brain bx, as dopplers pos upper - no filter with hopes to use heparin 13 days, get lower doppler, if pos then would filter, coags and plat okay for LP, I updated and consented wife, concerns from neuro is PML, ensure also HIV, weaning today goal PS 10-8 The patient is critically ill with multiple organ systems failure and requires high complexity decision making for assessment and support, frequent evaluation and titration of therapies, application of advanced monitoring technologies and extensive interpretation of multiple  databases.   Critical Care Time devoted to patient care services described in this note is35 Minutes. This time reflects time of care of this signee: Merrie Roof, MD FACP. This critical care time does not reflect procedure time, or teaching time or supervisory time of PA/NP/Med student/Med Resident etc but could involve care discussion time. Rest per NP/medical resident whose note is outlined above and that I agree with   Lavon Paganini. Titus Mould, MD, [place] Pgr: [facility] 11/02/2016 11:10 AM

## 2016-11-02 NOTE — Procedures (Signed)
Lumbar Puncture Procedure Note  Pre-operative Diagnosis: change in MS post bx, rule out PML, JC virus  Post-operative Diagnosis: rule out JC virus  Indications: Diagnostic  Procedure Details   Consent: Informed consent was obtained. Risks of the procedure were discussed including: infection, bleeding, pain and headache.  The patient was positioned under sterile conditions. Betadine solution and sterile drapes were utilized. A spinal needle was inserted at the L3 - L4 interspace.  Spinal fluid was obtained and sent to the laboratory.  Findings 45mL of clear spinal fluid was obtained.  initially traumatic then cleared Opening Pressure: not done cm H2O pressure. Closing Pressure: not done cm H2O pressure.  Complications:  None; patient tolerated the procedure well.        Condition: stable  Plan Bed rest for 5 hours.  Roger Gibson. Titus Mould, MD, [place] Pgr: [facility]

## 2016-11-02 NOTE — Progress Notes (Signed)
Inpatient Diabetes Program Recommendations  AACE/ADA: New Consensus Statement on Inpatient Glycemic Control (2015)  Target Ranges:  Prepandial:   less than 140 mg/dL      Peak postprandial:   less than 180 mg/dL (1-2 hours)      Critically ill patients:  140 - 180 mg/dL   Lab Results  Component Value Date   GLUCAP 283 (H) 11/02/2016   HGBA1C 5.2 03/22/2005    Review of Glycemic Control Results for Roger Gibson, Roger Gibson (MRN [id number]) as of 11/02/2016 09:42  Ref. Range 11/01/2016 16:46 11/01/2016 20:06 11/01/2016 23:35 11/02/2016 03:40 11/02/2016 07:53  Glucose-Capillary Latest Ref Range: 65 - 99 mg/dL 250 (H) 236 (H) 287 (H) 303 (H) 283 (H)   Inpatient Diabetes Program Recommendations:    While patient on steroids, please consider: -Novolog 4 units tube feed coverage q 4 hrs (hold if tube feedings held or discontinued)  Thank you, Bethena Roys E. Allanah Mcfarland, RN, MSN, CDE  Diabetes Coordinator Inpatient Glycemic Control Team Team Pager [phone] (8am-5pm) 11/02/2016 9:43 AM

## 2016-11-02 NOTE — Progress Notes (Signed)
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 Baseline creatinine 1.5 and 1.7. Pred/prograf/myfortic. 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 Presented to ED with left-sided weakness, admitted with sz- brain imaging possible/probable frontal lobe brain mass. Underwent a craniotomy and biopsy 10/5. Poor neurological function noted and CT head demonstrating post op changes. RUE brachial vein DVT noted for arm edema underwent LP to rule out CJ disease  Objective:  Vital signs in last 24 hours:  Temp:  [98.7 F (37.1 C)-101.4 F (38.6 C)] 99.3 F (37.4 C) (10/09 0800) Pulse Rate:  [63-90] 82 (10/09 1000) Resp:  [18-29] 27 (10/09 1000) BP: (130-183)/(65-93) 172/73 (10/09 1000) SpO2:  [95 %-100 %] 98 % (10/09 1000) FiO2 (%):  [30 %] 30 % (10/09 0739) Weight:  [200 lb 13.4 oz (91.1 kg)] 200 lb 13.4 oz (91.1 kg) (10/09 0500)  Weight change: 8 lb 13.1 oz (4 kg) Filed Weights   10/31/16 0500 11/01/16 0345 11/02/16 0500  Weight: 197 lb 12 oz (89.7 kg) 192 lb 0.3 oz (87.1 kg) 200 lb 13.4 oz (91.1 kg)    Intake/Output: I/O last 3 completed shifts: In: 3664.5 [I.V.:174.3; NG/GT:2442.8; IV Piggyback:1047.5] Out: 2915 [Urine:2915]   Intake/Output this shift:  Total I/O In: 312.5 [I.V.:10; NG/GT:195; IV Piggyback:107.5] Out: -   CVS- RRR RS- CTA ABD- BS present soft non-distended EXT- edema noted RUE  With DVT on ultrasound    Basic Metabolic Panel:  Recent Labs Lab 11/18/2016 0516  11/18/2016 0716 10/30/16 0415 10/31/16 0232 10/31/16 1419 11/01/16 0340 11/01/16 1456 11/02/16 0430  NA 155*  < > 151* 152*  149* 148*  --  148*  --  149*  K 3.4*  < > 4.1 3.5  3.5 3.6  --  3.7  --   3.6  CL 121*  < > 121* 121*  119* 118*  --  117*  --  117*  CO2 24  < > 25 23  23 24   --  24  --  24  GLUCOSE 198*  < > 190* 151*  154* 135*  --  191*  --  339*  BUN 30*  < > 34* 38*  36* 26*  --  29*  --  33*  CREATININE 1.34*  < > 1.64* 1.49*  1.52* 1.33*  --  1.14  --  1.13  CALCIUM 10.0  < > 9.2 8.6*  8.5* 8.4*  --  8.4*  --  8.9  MG 1.7  --   --   --   --  1.7 1.9 1.8  --   PHOS 2.5  < >  --  2.7 2.5 2.1* 2.8 2.3*  --   < > = values in this interval not displayed.  Liver Function Tests:  Recent Labs Lab 10/31/2016 0516 11/11/2016 0213 10/30/16 0415 10/31/16 0232  AST 66*  --   --   --   ALT 52  --   --   --   ALKPHOS 62  --   --   --   BILITOT 0.9  --   --   --   PROT 6.1*  --   --   --   ALBUMIN 2.5* 2.5* 2.1* 2.1*   No results for input(s): LIPASE, AMYLASE in  the last 168 hours.  Recent Labs Lab 11/01/16 1515 11/02/16 0430  AMMONIA 86* 132*    CBC:  Recent Labs Lab 11/17/2016 0516 10/31/2016 0213  10/25/2016 2122 10/30/2016 0222 10/30/16 0415 10/31/16 0232 11/01/16 0340  WBC 5.5 3.8*  < > 3.3* 3.7* 3.0* 4.6 4.7  NEUTROABS 4.8 3.1  --   --   --   --   --   --   HGB 13.4 13.5  < > 12.4* 12.0* 9.6* 9.5* 8.9*  HCT 42.4 42.5  < > 40.0 39.6 30.9* 30.4* 29.4*  MCV 88.9 91.2  < > 92.2 92.5 91.7 91.6 91.9  PLT 124* 98*  < > 87* 83* 121* 104* 117*  < > = values in this interval not displayed.  Cardiac Enzymes: No results for input(s): CKTOTAL, CKMB, CKMBINDEX, TROPONINI in the last 168 hours.  BNP: Invalid input(s): POCBNP  CBG:  Recent Labs Lab 11/01/16 1646 11/01/16 2006 11/01/16 2335 11/02/16 0340 11/02/16 0753  GLUCAP 250* 236* 287* 303* 67*    Microbiology: Results for orders placed or performed during the hospital encounter of 10/02/2016  MRSA PCR Screening     Status: None   Collection Time: 10/23/16  1:58 AM  Result Value Ref Range Status   MRSA by PCR NEGATIVE NEGATIVE Final    Comment:        The GeneXpert MRSA Assay (FDA approved for  NASAL specimens only), is one component of a comprehensive MRSA colonization surveillance program. It is not intended to diagnose MRSA infection nor to guide or monitor treatment for MRSA infections.   Culture, Urine     Status: Abnormal   Collection Time: 10/23/16 11:12 AM  Result Value Ref Range Status   Specimen Description URINE, RANDOM  Final   Special Requests NONE  Final   Culture MULTIPLE SPECIES PRESENT, SUGGEST RECOLLECTION (A)  Final   Report Status 10/24/2016 FINAL  Final  Culture, blood (Routine X 2) w Reflex to ID Panel     Status: None   Collection Time: 10/25/16 12:04 PM  Result Value Ref Range Status   Specimen Description BLOOD LEFT HAND  Final   Special Requests IN PEDIATRIC BOTTLE Blood Culture adequate volume  Final   Culture NO GROWTH 5 DAYS  Final   Report Status 10/30/2016 FINAL  Final  Culture, blood (Routine X 2) w Reflex to ID Panel     Status: None   Collection Time: 10/25/16 12:04 PM  Result Value Ref Range Status   Specimen Description BLOOD LEFT HAND  Final   Special Requests IN PEDIATRIC BOTTLE Blood Culture adequate volume  Final   Culture NO GROWTH 5 DAYS  Final   Report Status 10/30/2016 FINAL  Final  Acid Fast Smear (AFB)     Status: None   Collection Time: 11/12/2016  3:18 PM  Result Value Ref Range Status   AFB Specimen Processing Comment  Final    Comment: Tissue Grinding and Digestion/Decontamination   Acid Fast Smear Negative  Final    Comment: (NOTE) Performed At: [facility] [address] [place], [place] [id number] Lindon Romp MD ZM:[id number]    Source (AFB) TISSUE  Final    Coagulation Studies: No results for input(s): LABPROT, INR in the last 72 hours.  Urinalysis: No results for input(s): COLORURINE, LABSPEC, PHURINE, GLUCOSEU, HGBUR, BILIRUBINUR, KETONESUR, PROTEINUR, UROBILINOGEN, NITRITE, LEUKOCYTESUR in the last 72 hours.  Invalid input(s): APPERANCEUR    Imaging: Ct Head Wo Contrast  Result Date:  10/31/2016  CLINICAL DATA:  Altered level of consciousness. EXAM: CT HEAD WITHOUT CONTRAST TECHNIQUE: Contiguous axial images were obtained from the base of the skull through the vertex without intravenous contrast. COMPARISON:  MR brain without contrast 11/15/2016. MRI brain with contrast 10/26/2016. CT head without contrast 10/22/2016. FINDINGS: Brain: Posterior right frontal craniotomy is noted. Gas and fluid are present within the resection cavity in the posterior right frontal lobe. This corresponds to the very of neoplasm. A small amount of extra-axial hemorrhage overlies the resection cavity. Sulcal effacement is slightly improved following surgery. No acute infarct, hemorrhage, or mass lesion is present elsewhere. Ventricles are of normal size. The brainstem and cerebellum are normal. Vascular: Minimal vascular calcifications are present within the cavernous internal carotid arteries. There is no hyperdense vessel. Skull: Right frontal craniotomy is noted. Calvarium is otherwise intact. Sinuses/Orbits: Minimal fluid is present within the anterior left ethmoid air cells. Bilateral maxillary antrostomies are noted. The paranasal sinuses are otherwise clear. Mastoid air cells are clear. The globes and orbits are within normal limits bilaterally. IMPRESSION: 1. Right frontal craniotomy with expected changes in the surgical cavity. 2. Minimal extra-axial hemorrhage overlying the surgical cavity, within normal limits. 3. Slight reduction in mass effect. 4. No evidence for complication on CT. No acute intracranial abnormality. Electronically Signed   By: San Morelle M.D.   On: 10/31/2016 13:36   Mr Jeri Cos EH Contrast  Result Date: 11/01/2016 CLINICAL DATA:  Initial evaluation for acute altered mental status status post recent biopsy of right frontal lobe lesion. EXAM: MRI HEAD WITHOUT AND WITH CONTRAST TECHNIQUE: Multiplanar, multiecho pulse sequences of the brain and surrounding structures were  obtained without and with intravenous contrast. CONTRAST:  10 cc of MultiHance. COMPARISON:  Prior MRI from 11/22/2016. FINDINGS: Brain: Postoperative changes from recent posterior right frontal craniotomy for biopsy of previously identified posterior right frontal lobe mass. Heterogeneous T2/FLAIR signal intensity with postoperative blood products present within the resection cavity. Mild diffusion abnormality surrounding the resection cavity felt to be within normal limits for normal expected postoperative changes without evidence for significant ischemia or superimposed seizure activity. Smooth contiguous enhancement about the resection cavity felt to be within normal limits for normal expected postoperative changes. Residual T2/FLAIR signal abnormality within the adjacent posterior right frontal lesion consistent with known lesion (series 6, image 22). Associated edema relatively similar to preoperative MRI. Small subdural collection overlying the right cerebral convexity measures up to 3 mm in thickness without significant mass effect, felt to be within normal limits for normal expected postoperative changes. Overlying postoperative dural thickening and enhancement. Single punctate diffusion abnormality located more anteriorly within the right frontal lobe favored to reflect small amount of postoperative blood products, possibly within the subarachnoid space, although a tiny punctate cortical infarct is not entirely excluded (series 3, image 36). Otherwise, appearance of the brain is stable. Gray-white matter differentiation otherwise maintained. No other evidence for acute or subacute ischemia. No other mass lesion or abnormal enhancement. Trace right-to-left midline shift of the septum pellucidum without significant midline shift. Basilar cisterns remain patent. Stable ventricular size without hydrocephalus. Pituitary gland suprasellar region normal. Midline structures intact and normal. Vascular: Major  intracranial vascular flow voids are maintained. Skull and upper cervical spine: Craniocervical junction within normal limits. Multilevel degenerative spondylolysis noted within the upper cervical spine. Resultant mild spinal stenosis at C2-3. Bone marrow signal intensity is heterogeneous. Post craniotomy changes present at the posterior right frontal calvarium. Overlying skin staples. Sinuses/Orbits: Globes and orbital soft tissues within normal  limits. Scattered mucosal thickening noted within the paranasal sinuses. Fluid layering within the posterior nasopharynx noted. Patient is intubated. Small bilateral mastoid effusions noted. Other: None. IMPRESSION: 1. Postoperative changes from recent right frontal craniotomy for biopsy/partial resection of posterior right frontal lobe mass. No complication. Residual mass surrounds the resection cavity with similar localized edema. 2. Small postoperative extra-axial collection overlying the right cerebral convexity, felt to be within normal limits for normal expected postoperative changes. 3. Punctate diffusion abnormality within the anterior right frontal lobe, suspected to reflect a small amount of postoperative blood products within the subarachnoid space, although a tiny cortical infarct is not excluded. 4. No other acute intracranial abnormality. Electronically Signed   By: Jeannine Boga M.D.   On: 11/01/2016 19:33   Dg Chest Port 1 View  Result Date: 11/01/2016 CLINICAL DATA:  Respiratory failure. EXAM: PORTABLE CHEST 1 VIEW COMPARISON:  11/11/2016 FINDINGS: Endotracheal tube terminates 3 cm above the carina. Enteric tube courses into the stomach with tip not imaged. A left brachiocephalic vein stent is noted. The cardiomediastinal silhouette is unchanged. Lung volumes are mildly lower than on the prior study with mildly increased opacity in the left lung base. Minimal right basilar opacity is similar to the prior study. A trace left pleural effusion is  not excluded. No pneumothorax is identified. IMPRESSION: 1. Low lung volumes with mildly increased left basilar opacity, likely atelectasis. 2. Possible trace left pleural effusion. Electronically Signed   By: Logan Bores M.D.   On: 11/01/2016 07:20     Medications:   . feeding supplement (VITAL AF 1.2 CAL) 1,000 mL (11/02/16 1000)  . lacosamide (VIMPAT) IV Stopped (11/01/16 2340)  . levETIRAcetam Stopped (11/02/16 0422)  . levETIRAcetam Stopped (11/02/16 1010)  . propofol (DIPRIVAN) infusion Stopped (11/02/16 0714)   . bethanechol  10 mg Oral TID  . chlorhexidine gluconate (MEDLINE KIT)  15 mL Mouth Rinse BID  . Chlorhexidine Gluconate Cloth  6 each Topical Q0600  . cholecalciferol  2,000 Units Oral Daily  . dexamethasone  4 mg Intravenous Q6H  . free water  150 mL Per Tube Q6H  . heparin subcutaneous  5,000 Units Subcutaneous Q12H  . insulin aspart  0-9 Units Subcutaneous Q4H  . lactulose  30 g Oral TID  . mouth rinse  15 mL Mouth Rinse 10 times per day  . metoprolol tartrate  50 mg Per Tube BID  . mycophenolate  250 mg Oral BID  . pantoprazole sodium  40 mg Per Tube Q24H  . pravastatin  20 mg Per Tube q1800  . sodium chloride flush  10-40 mL Intracatheter Q12H  . sodium chloride flush  3 mL Intravenous Q12H  . tacrolimus  3 mg Oral BID   Or  . tacrolimus  1.5 mg Sublingual BID   acetaminophen **OR** acetaminophen, albuterol, bisacodyl, camphor-menthol, hydrALAZINE, HYDROmorphone (DILAUDID) injection, labetalol, levalbuterol, naLOXone (NARCAN)  injection, [DISCONTINUED] ondansetron **OR** ondansetron (ZOFRAN) IV, promethazine, senna-docusate, sodium chloride flush  Assessment/ Plan:  1. Frontal lobe lesion/left sided weakness/lethargy - s/p craniotomy. Path and cultures/smears pending 2. Vent - per CCM 3. Renal xpl #2 - BL creatinine 1.5-1.7. AKI on CKD (creatinine 2.29 on admission) on setting of hospitalization for possibly stroke/seizure/possible brain tumor with decreased  mental status. Creatinine at baseline. Getting IV steroids (currently decadron for brain issues so pred on hold) and IV mycophenolate, SL prograf at 1/2 dose. TAC trough ^ 10.3 on 9/30 (did not come back until 10/2) Reduced prograf dosing to 3 mg BID 10/2,  and 1.5 mg BID 10/4 (SL); creatinine is stable  1. Repeat TAC level pending from this am 10/7 for further dose adjustment 4. Metabolic acidosis - resolved 5. Hypokalemia - replete prn 6. Hypernatremia - Gettingfree water via OGT. Slow improvement still 149  Discussed with critical care they are happy with mild hypernatremia  7. Systolic and diastolic HF 8. HTN - currently IV metoprolol 9. Assymetric RUE edema - [place] UE today brachial vein DVT (no extension into axillary v) -CCM aware. I would presume heparin contraindicated in light of recent craniotomy 10. Post op Hb variation - Hb 12->9.6 >8.9. Trend     LOS: 12 Randle Shatzer W @TODAY @10 :22 AM

## 2016-11-02 NOTE — Procedures (Signed)
  Video EEG Monitoring Report    Dates of monitoring:   11/01/16 @16 :20 to 11/02/16 @08 :15 Recording day:    1 (started on 10/8) EEG Number:    [id number] Requesting provider:   Amie Portland, MD Interpreting physician:  Beather Arbour Lakshmi Sundeen, MD  CPT:  [phone] ICD-10:   R41.82, C79.31   Present History: 71 year old man who had presented with new onset seizures and persistent left hemiparesis, found to have a right frontal mass, now status post craniotomy and biopsy on 10/5. He is persistently obtunded. Continuous VEEG requested to evaluate for seizures.   EEG Classification  Abnormal, Stupor  PDR  6-7 Hz, poorly formed and seen only on the left    HR  80 bpm and regular    Background abnormalities:   1. Continuous slow, RIGHT>left hemisphere     Periodic, rhythmic or epileptiform abnormalities:   1. Triphasic LPDs, LEFT>right hemisphere   Ictal phenomena:  none    EEG DETAILS:  TYPE OF RECORDING: At least 18-channel digital EEG with using a standard international 10-20 placement with additional EEG electrodes, and 1 additional channel dedicated to the EKG, at a sampling rate of at least 256 Hz. Video was recorded throughout the study. The recording was interpreted using digital review software allowing for montage reformatting, gain and filter changes. Each page was reviewed manually. Automatic spike and seizure detection software and quantitative analysis tools were used as needed.   Description of EEG features: The recording reveals a continuous, moderately variable and reactive asymmetric background.   There is a bilateral asynchronous delta>theta background. On the left, there are more complex and faster rhythms. There are arousals to stimulation with emergence of a poorly formed 6-7 Hz background, that does not have a good antero-posterior gradient. Myogenic and eye blink artifact is noted during these periods.   On the right, there is a higher amplitude of delta frequencies and  less prominent faster delta and theta and overall less reactivity.   During less aroused periods, there are occasional segments during which poorly formed spindle activity is seen only on the left.   This asymmetric slowing is progressively less prominent over the course of the recording.  A periodic pattern of triphasic waves is seen. This remarkably state dependent and is almost exclusively in the more aroused state. They are significantly higher amplitude and better formed on the left. This pattern is frequent (~30% of the recording), intermediate duration (1-5 minutes), periodic-semiperiodic with a period of 1 / 1.5-2 seconds and is triphasic, with sharply contoured waveforms of medium amplitude (50-200 V) displaying a characteristic antero-posterior lag. Occasionally, and progressively more in the second half of the epoch, less well formed and lower amplitude correspondent delta transients are seen on the right as well.   Push Button Events: none  Interpretation: This continuous EEG is indicative of a moderate diffuse encephalopathy, non-specific as to etiology, but with evidence of toxic-metabolic contribution. Sedating medication effects cannot be excluded.   There is an asymmetrically slower background on the right. Higher amplitude background and "triphasic waves" are seen on the left (opposite to the lesion). The most probable interpretation is that there is more severe cortical dysfunction in the right hemisphere.   No seizures or unequivocal epileptiform discharges are seen.

## 2016-11-02 NOTE — Progress Notes (Deleted)
[facility] Progress Note Patient Name: Roger Gibson DOB: [birth year] MRN: [id number]   Date of Service  11/02/2016  HPI/Events of Note  Hypoxemia, poor airway protection Responded to NTS  eICU Interventions  CXR ordered, push pulm hygiene     Intervention Category Major Interventions: Respiratory failure - evaluation and management  [place] S. 11/02/2016, 7:26 PM

## 2016-11-02 NOTE — Progress Notes (Addendum)
Subjective: No change in exam or in mental status.   Objective: Current vital signs: BP (!) 147/68   Pulse 75   Temp 98.7 F (37.1 C) (Axillary)   Resp 20   Ht 5' 10"  (1.778 m)   Wt 91.1 kg (200 lb 13.4 oz)   SpO2 98%   BMI 28.82 kg/m  Vital signs in last 24 hours: Temp:  [98.7 F (37.1 C)-101.4 F (38.6 C)] 98.7 F (37.1 C) (10/09 0400) Pulse Rate:  [63-90] 75 (10/09 0739) Resp:  [18-33] 20 (10/09 0739) BP: (130-183)/(66-93) 147/68 (10/09 0739) SpO2:  [95 %-100 %] 98 % (10/09 0739) FiO2 (%):  [30 %] 30 % (10/09 0739) Weight:  [91.1 kg (200 lb 13.4 oz)] 91.1 kg (200 lb 13.4 oz) (10/09 0500)  Intake/Output from previous day: 10/08 0701 - 10/09 0700 In: 2302 [I.V.:154.3; NG/GT:1362.8; IV Piggyback:785] Out: 2065 [Urine:2065] Intake/Output this shift: No intake/output data recorded. Nutritional status:    ROS:                                                                                                                                       History obtained from unobtainable from patient due to mental status  Neurologic Exam: General: NAD Mental Status: Obtunded but opens eyes to loud voice and sternal rub Cranial Nerves: II:  Visual fields grossly normal, pupils equal, round, reactive to light and accommodation III,IV, VI: ptosis not present, doll's intact but does not track my movements.  V,VII: face appears  Symmetric but intubated, facial light touch sensation normal bilaterally VIII: opens eyes to voice Motor: No spontaneous or purposeful movements.  Sensory: no response in extremities but opens eyes to sternal rub Deep Tendon Reflexes:  Depressed throughout  Lab Results: Basic Metabolic Panel:  Recent Labs Lab 11/16/2016 0516  11/09/2016 0716 10/30/16 0415 10/31/16 0232 10/31/16 1419 11/01/16 0340 11/01/16 1456 11/02/16 0430  NA 155*  < > 151* 152*  149* 148*  --  148*  --  149*  K 3.4*  < > 4.1 3.5  3.5 3.6  --  3.7  --  3.6  CL 121*  < > 121*  121*  119* 118*  --  117*  --  117*  CO2 24  < > 25 23  23 24   --  24  --  24  GLUCOSE 198*  < > 190* 151*  154* 135*  --  191*  --  339*  BUN 30*  < > 34* 38*  36* 26*  --  29*  --  33*  CREATININE 1.34*  < > 1.64* 1.49*  1.52* 1.33*  --  1.14  --  1.13  CALCIUM 10.0  < > 9.2 8.6*  8.5* 8.4*  --  8.4*  --  8.9  MG 1.7  --   --   --   --  1.7 1.9 1.8  --  PHOS 2.5  < >  --  2.7 2.5 2.1* 2.8 2.3*  --   < > = values in this interval not displayed.  Liver Function Tests:  Recent Labs Lab 10/26/2016 0516 11/08/2016 0213 10/30/16 0415 10/31/16 0232  AST 66*  --   --   --   ALT 52  --   --   --   ALKPHOS 62  --   --   --   BILITOT 0.9  --   --   --   PROT 6.1*  --   --   --   ALBUMIN 2.5* 2.5* 2.1* 2.1*   No results for input(s): LIPASE, AMYLASE in the last 168 hours.  Recent Labs Lab 11/01/16 1515 11/02/16 0430  AMMONIA 86* 132*    CBC:  Recent Labs Lab 11/03/2016 0516 11/01/2016 0213  11/02/2016 2122 11/06/2016 0222 10/30/16 0415 10/31/16 0232 11/01/16 0340  WBC 5.5 3.8*  < > 3.3* 3.7* 3.0* 4.6 4.7  NEUTROABS 4.8 3.1  --   --   --   --   --   --   HGB 13.4 13.5  < > 12.4* 12.0* 9.6* 9.5* 8.9*  HCT 42.4 42.5  < > 40.0 39.6 30.9* 30.4* 29.4*  MCV 88.9 91.2  < > 92.2 92.5 91.7 91.6 91.9  PLT 124* 98*  < > 87* 83* 121* 104* 117*  < > = values in this interval not displayed.  Cardiac Enzymes: No results for input(s): CKTOTAL, CKMB, CKMBINDEX, TROPONINI in the last 168 hours.  Lipid Panel:  Recent Labs Lab 11/16/2016 1810 11/01/16 1456  TRIG 296* 151*    CBG:  Recent Labs Lab 11/01/16 1646 11/01/16 2006 11/01/16 2335 11/02/16 0340 11/02/16 0753  GLUCAP 250* 236* 287* 303* 34*    Microbiology: Results for orders placed or performed during the hospital encounter of 10/15/2016  MRSA PCR Screening     Status: None   Collection Time: 10/23/16  1:58 AM  Result Value Ref Range Status   MRSA by PCR NEGATIVE NEGATIVE Final    Comment:        The GeneXpert MRSA  Assay (FDA approved for NASAL specimens only), is one component of a comprehensive MRSA colonization surveillance program. It is not intended to diagnose MRSA infection nor to guide or monitor treatment for MRSA infections.   Culture, Urine     Status: Abnormal   Collection Time: 10/23/16 11:12 AM  Result Value Ref Range Status   Specimen Description URINE, RANDOM  Final   Special Requests NONE  Final   Culture MULTIPLE SPECIES PRESENT, SUGGEST RECOLLECTION (A)  Final   Report Status 10/24/2016 FINAL  Final  Culture, blood (Routine X 2) w Reflex to ID Panel     Status: None   Collection Time: 10/25/16 12:04 PM  Result Value Ref Range Status   Specimen Description BLOOD LEFT HAND  Final   Special Requests IN PEDIATRIC BOTTLE Blood Culture adequate volume  Final   Culture NO GROWTH 5 DAYS  Final   Report Status 10/30/2016 FINAL  Final  Culture, blood (Routine X 2) w Reflex to ID Panel     Status: None   Collection Time: 10/25/16 12:04 PM  Result Value Ref Range Status   Specimen Description BLOOD LEFT HAND  Final   Special Requests IN PEDIATRIC BOTTLE Blood Culture adequate volume  Final   Culture NO GROWTH 5 DAYS  Final   Report Status 10/30/2016 FINAL  Final  Acid Fast Smear (  AFB)     Status: None   Collection Time: 11/12/2016  3:18 PM  Result Value Ref Range Status   AFB Specimen Processing Comment  Final    Comment: Tissue Grinding and Digestion/Decontamination   Acid Fast Smear Negative  Final    Comment: (NOTE) Performed At: [facility] [place], [place] [id number] Lindon Romp MD CX:[id number]    Source (AFB) TISSUE  Final    Coagulation Studies: No results for input(s): LABPROT, INR in the last 72 hours.  Imaging: Ct Head Wo Contrast  Result Date: 10/31/2016 CLINICAL DATA:  Altered level of consciousness. EXAM: CT HEAD WITHOUT CONTRAST TECHNIQUE: Contiguous axial images were obtained from the base of the skull through the vertex  without intravenous contrast. COMPARISON:  MR brain without contrast 10/31/2016. MRI brain with contrast 11/17/2016. CT head without contrast 10/22/2016. FINDINGS: Brain: Posterior right frontal craniotomy is noted. Gas and fluid are present within the resection cavity in the posterior right frontal lobe. This corresponds to the very of neoplasm. A small amount of extra-axial hemorrhage overlies the resection cavity. Sulcal effacement is slightly improved following surgery. No acute infarct, hemorrhage, or mass lesion is present elsewhere. Ventricles are of normal size. The brainstem and cerebellum are normal. Vascular: Minimal vascular calcifications are present within the cavernous internal carotid arteries. There is no hyperdense vessel. Skull: Right frontal craniotomy is noted. Calvarium is otherwise intact. Sinuses/Orbits: Minimal fluid is present within the anterior left ethmoid air cells. Bilateral maxillary antrostomies are noted. The paranasal sinuses are otherwise clear. Mastoid air cells are clear. The globes and orbits are within normal limits bilaterally. IMPRESSION: 1. Right frontal craniotomy with expected changes in the surgical cavity. 2. Minimal extra-axial hemorrhage overlying the surgical cavity, within normal limits. 3. Slight reduction in mass effect. 4. No evidence for complication on CT. No acute intracranial abnormality. Electronically Signed   By: San Morelle M.D.   On: 10/31/2016 13:36   Mr Jeri Cos HF Contrast  Result Date: 11/01/2016 CLINICAL DATA:  Initial evaluation for acute altered mental status status post recent biopsy of right frontal lobe lesion. EXAM: MRI HEAD WITHOUT AND WITH CONTRAST TECHNIQUE: Multiplanar, multiecho pulse sequences of the brain and surrounding structures were obtained without and with intravenous contrast. CONTRAST:  10 cc of MultiHance. COMPARISON:  Prior MRI from 11/20/2016. FINDINGS: Brain: Postoperative changes from recent posterior right  frontal craniotomy for biopsy of previously identified posterior right frontal lobe mass. Heterogeneous T2/FLAIR signal intensity with postoperative blood products present within the resection cavity. Mild diffusion abnormality surrounding the resection cavity felt to be within normal limits for normal expected postoperative changes without evidence for significant ischemia or superimposed seizure activity. Smooth contiguous enhancement about the resection cavity felt to be within normal limits for normal expected postoperative changes. Residual T2/FLAIR signal abnormality within the adjacent posterior right frontal lesion consistent with known lesion (series 6, image 22). Associated edema relatively similar to preoperative MRI. Small subdural collection overlying the right cerebral convexity measures up to 3 mm in thickness without significant mass effect, felt to be within normal limits for normal expected postoperative changes. Overlying postoperative dural thickening and enhancement. Single punctate diffusion abnormality located more anteriorly within the right frontal lobe favored to reflect small amount of postoperative blood products, possibly within the subarachnoid space, although a tiny punctate cortical infarct is not entirely excluded (series 3, image 36). Otherwise, appearance of the brain is stable. Gray-white matter differentiation otherwise maintained. No other evidence for acute or  subacute ischemia. No other mass lesion or abnormal enhancement. Trace right-to-left midline shift of the septum pellucidum without significant midline shift. Basilar cisterns remain patent. Stable ventricular size without hydrocephalus. Pituitary gland suprasellar region normal. Midline structures intact and normal. Vascular: Major intracranial vascular flow voids are maintained. Skull and upper cervical spine: Craniocervical junction within normal limits. Multilevel degenerative spondylolysis noted within the upper  cervical spine. Resultant mild spinal stenosis at C2-3. Bone marrow signal intensity is heterogeneous. Post craniotomy changes present at the posterior right frontal calvarium. Overlying skin staples. Sinuses/Orbits: Globes and orbital soft tissues within normal limits. Scattered mucosal thickening noted within the paranasal sinuses. Fluid layering within the posterior nasopharynx noted. Patient is intubated. Small bilateral mastoid effusions noted. Other: None. IMPRESSION: 1. Postoperative changes from recent right frontal craniotomy for biopsy/partial resection of posterior right frontal lobe mass. No complication. Residual mass surrounds the resection cavity with similar localized edema. 2. Small postoperative extra-axial collection overlying the right cerebral convexity, felt to be within normal limits for normal expected postoperative changes. 3. Punctate diffusion abnormality within the anterior right frontal lobe, suspected to reflect a small amount of postoperative blood products within the subarachnoid space, although a tiny cortical infarct is not excluded. 4. No other acute intracranial abnormality. Electronically Signed   By: Jeannine Boga M.D.   On: 11/01/2016 19:33   Dg Chest Port 1 View  Result Date: 11/01/2016 CLINICAL DATA:  Respiratory failure. EXAM: PORTABLE CHEST 1 VIEW COMPARISON:  11/08/2016 FINDINGS: Endotracheal tube terminates 3 cm above the carina. Enteric tube courses into the stomach with tip not imaged. A left brachiocephalic vein stent is noted. The cardiomediastinal silhouette is unchanged. Lung volumes are mildly lower than on the prior study with mildly increased opacity in the left lung base. Minimal right basilar opacity is similar to the prior study. A trace left pleural effusion is not excluded. No pneumothorax is identified. IMPRESSION: 1. Low lung volumes with mildly increased left basilar opacity, likely atelectasis. 2. Possible trace left pleural effusion.  Electronically Signed   By: Logan Bores M.D.   On: 11/01/2016 07:20    Medications:  Scheduled: . bethanechol  10 mg Oral TID  . chlorhexidine gluconate (MEDLINE KIT)  15 mL Mouth Rinse BID  . Chlorhexidine Gluconate Cloth  6 each Topical Q0600  . cholecalciferol  2,000 Units Oral Daily  . dexamethasone  4 mg Intravenous Q6H  . free water  150 mL Per Tube Q6H  . insulin aspart  0-9 Units Subcutaneous Q4H  . lactulose  30 g Oral TID  . mouth rinse  15 mL Mouth Rinse 10 times per day  . metoprolol tartrate  50 mg Per Tube BID  . mycophenolate  250 mg Oral BID  . pantoprazole sodium  40 mg Per Tube Q24H  . pravastatin  20 mg Per Tube q1800  . sodium chloride flush  10-40 mL Intracatheter Q12H  . sodium chloride flush  3 mL Intravenous Q12H  . tacrolimus  3 mg Oral BID   Or  . tacrolimus  1.5 mg Sublingual BID   Etta Quill PA-C [facility] [phone]  11/02/2016, 8:02 AM   Attending addendum Patient seen and examined No change in exam from yesterday Continues to be obtunded with no purposeful movements. Differentials: Toxic metabolic encephalopathy, frontal mass, PML  Impression: 71 YO male with frontal lobe lesion--S/P biopsy (pathology pending). Currently in a obtunded state with no clear cause. MRI does not reveal any new lesions. 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 Tissue diagnosis is the gold standard diagnosis for PML, but a spinal tap with JC virus testing is also confirmatory.  Recommendations: EEG LTM--pending reading LP to evaluate for JC virus --will be done by PCCM Ammonia--elevated currently on Lactulose-- current level higher than yestrday(132)  Depakote level--48. Can d/c depakote. C/w lacosamide and leveteracitam -- Amie Portland, MD [facility] [phone]  If 7pm to 7am,  please call on call as listed on AMION.   ADDENDUM LTM shows no sz. Plan as above.

## 2016-11-02 NOTE — Progress Notes (Signed)
[facility] Progress Note Patient Name: BRANDYN LOWREY DOB: 06-14-1945 MRN: [id number]   Date of Service  11/02/2016  HPI/Events of Note  More freq PVC hypokalemia  eICU Interventions  Replete K, check BMP and Mg in am        Novalyn Lajara S. 11/02/2016, 8:12 PM

## 2016-11-03 ENCOUNTER — Inpatient Hospital Stay (HOSPITAL_COMMUNITY): Payer: Medicare HMO

## 2016-11-03 DIAGNOSIS — I82629 Acute embolism and thrombosis of deep veins of unspecified upper extremity: Secondary | ICD-10-CM

## 2016-11-03 LAB — GLUCOSE, CAPILLARY
GLUCOSE-CAPILLARY: 281 mg/dL — AB (ref 65–99)
GLUCOSE-CAPILLARY: 303 mg/dL — AB (ref 65–99)
GLUCOSE-CAPILLARY: 311 mg/dL — AB (ref 65–99)
GLUCOSE-CAPILLARY: 324 mg/dL — AB (ref 65–99)
GLUCOSE-CAPILLARY: 353 mg/dL — AB (ref 65–99)
Glucose-Capillary: 413 mg/dL — ABNORMAL HIGH (ref 65–99)

## 2016-11-03 LAB — BASIC METABOLIC PANEL
ANION GAP: 4 — AB (ref 5–15)
Anion gap: 10 (ref 5–15)
BUN: 36 mg/dL — ABNORMAL HIGH (ref 6–20)
BUN: 36 mg/dL — ABNORMAL HIGH (ref 6–20)
CHLORIDE: 115 mmol/L — AB (ref 101–111)
CHLORIDE: 121 mmol/L — AB (ref 101–111)
CO2: 25 mmol/L (ref 22–32)
CO2: 23 mmol/L (ref 22–32)
CREATININE: 1.29 mg/dL — AB (ref 0.61–1.24)
CREATININE: 0.96 mg/dL (ref 0.61–1.24)
Calcium: 9.6 mg/dL (ref 8.9–10.3)
Calcium: 9.4 mg/dL (ref 8.9–10.3)
GFR calc non Af Amer: 54 mL/min — ABNORMAL LOW (ref >60)
GFR calc non Af Amer: >60 mL/min (ref >60)
GLUCOSE: 362 mg/dL — AB (ref 65–99)
Glucose, Bld: 410 mg/dL — ABNORMAL HIGH (ref 65–99)
POTASSIUM: 4.3 mmol/L (ref 3.5–5.1)
Potassium: 4.2 mmol/L (ref 3.5–5.1)
SODIUM: 150 mmol/L — AB (ref 135–145)
Sodium: 148 mmol/L — ABNORMAL HIGH (ref 135–145)

## 2016-11-03 LAB — HIV ANTIBODY (ROUTINE TESTING W REFLEX): HIV Screen 4th Generation wRfx: NONREACTIVE

## 2016-11-03 LAB — MAGNESIUM: Magnesium: 2.2 mg/dL (ref 1.7–2.4)

## 2016-11-03 MED ORDER — HYDRALAZINE HCL 25 MG PO TABS
25.0000 mg | ORAL_TABLET | Freq: Three times a day (TID) | ORAL | Status: DC
  Administered 2016-11-03 – 2016-11-04 (×3): 25 mg via ORAL
  Filled 2016-11-03 (×3): qty 1

## 2016-11-03 MED ORDER — INSULIN ASPART 100 UNIT/ML ~~LOC~~ SOLN
6.0000 [IU] | SUBCUTANEOUS | Status: DC
  Administered 2016-11-03 – 2016-11-12 (×52): 6 [IU] via SUBCUTANEOUS

## 2016-11-03 MED ORDER — HYDRALAZINE HCL 20 MG/ML IJ SOLN
10.0000 mg | INTRAMUSCULAR | Status: DC | PRN
  Administered 2016-11-03 – 2016-11-12 (×7): 10 mg via INTRAVENOUS
  Filled 2016-11-03 (×7): qty 1

## 2016-11-03 MED ORDER — INSULIN GLARGINE 100 UNIT/ML ~~LOC~~ SOLN
15.0000 [IU] | Freq: Every day | SUBCUTANEOUS | Status: DC
  Administered 2016-11-03 – 2016-11-04 (×2): 15 [IU] via SUBCUTANEOUS
  Filled 2016-11-03 (×3): qty 0.15

## 2016-11-03 MED ORDER — INSULIN ASPART 100 UNIT/ML ~~LOC~~ SOLN
0.0000 [IU] | SUBCUTANEOUS | Status: DC
  Administered 2016-11-03 (×2): 15 [IU] via SUBCUTANEOUS
  Administered 2016-11-03: 11 [IU] via SUBCUTANEOUS
  Administered 2016-11-03: 15 [IU] via SUBCUTANEOUS
  Administered 2016-11-03 – 2016-11-04 (×2): 20 [IU] via SUBCUTANEOUS
  Administered 2016-11-04: 7 [IU] via SUBCUTANEOUS
  Administered 2016-11-04 (×2): 11 [IU] via SUBCUTANEOUS
  Administered 2016-11-04: 7 [IU] via SUBCUTANEOUS
  Administered 2016-11-04 – 2016-11-05 (×2): 15 [IU] via SUBCUTANEOUS
  Administered 2016-11-05: 4 [IU] via SUBCUTANEOUS
  Administered 2016-11-05 (×2): 7 [IU] via SUBCUTANEOUS
  Administered 2016-11-05 (×2): 11 [IU] via SUBCUTANEOUS
  Administered 2016-11-06 (×6): 4 [IU] via SUBCUTANEOUS
  Administered 2016-11-07 (×2): 7 [IU] via SUBCUTANEOUS
  Administered 2016-11-07 (×3): 4 [IU] via SUBCUTANEOUS
  Administered 2016-11-07: 7 [IU] via SUBCUTANEOUS
  Administered 2016-11-07: 4 [IU] via SUBCUTANEOUS
  Administered 2016-11-08: 11 [IU] via SUBCUTANEOUS
  Administered 2016-11-08: 4 [IU] via SUBCUTANEOUS
  Administered 2016-11-08 (×3): 7 [IU] via SUBCUTANEOUS
  Administered 2016-11-09: 4 [IU] via SUBCUTANEOUS
  Administered 2016-11-09 (×3): 3 [IU] via SUBCUTANEOUS
  Administered 2016-11-09: 4 [IU] via SUBCUTANEOUS
  Administered 2016-11-09: 7 [IU] via SUBCUTANEOUS
  Administered 2016-11-10: 3 [IU] via SUBCUTANEOUS
  Administered 2016-11-10 (×2): 4 [IU] via SUBCUTANEOUS
  Administered 2016-11-10: 3 [IU] via SUBCUTANEOUS
  Administered 2016-11-10: 7 [IU] via SUBCUTANEOUS
  Administered 2016-11-11: 3 [IU] via SUBCUTANEOUS
  Administered 2016-11-12: 4 [IU] via SUBCUTANEOUS
  Administered 2016-11-12: 3 [IU] via SUBCUTANEOUS

## 2016-11-03 NOTE — Progress Notes (Signed)
Subjective: Pt opening eyes to voice. Tolerated CPAP trials all day yesterday    Exam: Vitals:   11/03/16 0812 11/03/16 0857  BP:  (!) 164/72  Pulse:  73  Resp:    Temp: 99 F (37.2 C)   SpO2:      HEENT-  Normocephalic, no lesions, without obvious abnormality.  Normal external eye and conjunctiva.  Normal TM's bilaterally.  Normal auditory canals and external ears. Normal external nose, mucus membranes and septum.  Normal pharynx. Cardiovascular- S1, S2 normal, pulses palpable throughout   Extremities: bilateral arms show pitting edema and taut    Neuro:  CN: Pupils are equal and round. They are symmetrically reactive from 3-->2 mm. EOMI without nystagmus. When eyes are open to vocal stimuli right eye has ptosis, no response to noxious stimuli in face or trapezius.  Face is symmetric at rest with normal strength and mobility.   Motor: does not move extremities spontanious  Sensation:  No response to pain.  DTRs: none      EEG no seizure activity     Impression: 71 YO male with frontal lobe lesion--S/P biopsy (pathology pending). Currently in a obtunded state with no clear cause   - VPA at 48. Con't current dose - EEG as above - Vent protocol as per primary team.

## 2016-11-03 NOTE — Progress Notes (Signed)
**  Preliminary report by tech**  Bilateral lower extremity venous duplex completed. There is no evidence of deep or superficial vein thrombosis involving the right and left lower extremities. All visualized vessels appear patent and compressible. There is no evidence of Baker's cysts bilaterally.  11/03/16 11:53 AM Carlos Levering RVT

## 2016-11-03 NOTE — Progress Notes (Addendum)
PULMONARY / CRITICAL CARE MEDICINE   Name: Roger Gibson MRN: [id number] DOB: [birth year]    ADMISSION DATE:  10/01/2016 CONSULTATION DATE:  11/16/2016  REFERRING MD: Dr. Wynelle Cleveland, [facility]  CHIEF COMPLAINT:  Lt arm weakness  HISTORY OF PRESENT ILLNESS:   71 yo male with Lt arm weakness and found to have frontal lobe mass.  Had craniotomy 10/05 and remained on vent post op.  PMHx of ESRD, HTN, combined CHF, Rt renal cell cancer s/p transplant.  SUBJECTIVE:  Remains intubated.  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  Overnight, sugar continued to rise, SSI modified to resistant  Tolerated LP yesterday, performed to rule out JC virus and PML prior to bx results becoming available.    VITAL SIGNS: BP (!) 146/64   Pulse 65   Temp 99 F (37.2 C) (Oral)   Resp 18   Ht 5\' 10"  (1.778 m)   Wt 200 lb 13.4 oz (91.1 kg)   SpO2 99%   BMI 28.82 kg/m   VENTILATOR SETTINGS: Vent Mode: PRVC FiO2 (%):  [30 %] 30 % Set Rate:  [18 bmp] 18 bmp Vt Set:  [610 mL] 610 mL PEEP:  [5 cmH20] 5 cmH20 Pressure Support:  [12 cmH20] 12 cmH20 Plateau Pressure:  [16 cmH20-19 cmH20] 17 cmH20  INTAKE / OUTPUT: I/O last 3 completed shifts: In: 5146.6 [I.V.:2327.6; NG/GT:2134; IV Piggyback:685] Out: 4765 [Urine:3390]  PHYSICAL EXAMINATION:  General: Remains on vent, not alert  Neuro: Opens eyes, no ext movement, perrl, doesn't fc HEENT: ett secured in place PULM: Coarse breath sounds  CV:  RRR, no murmur appreciated  GI: soft, +BS Extremities: Bilateral UE edema    LABS:  BMET  Recent Labs Lab 11/02/16 0430 11/02/16 1700 11/03/16 0500  NA 149* 150* 150*  K 3.6 3.5 4.3  CL 117* 117* 115*  CO2 24 24 25   BUN 33* 32* 36*  CREATININE 1.13 1.00 1.29*  GLUCOSE 339* 391* 410*    Electrolytes  Recent Labs Lab 10/31/16 1419 11/01/16 0340 11/01/16 1456 11/02/16 0430 11/02/16 1700 11/03/16 0500  CALCIUM  --  8.4*  --  8.9 9.2 9.6  MG 1.7 1.9 1.8  --   --  2.2  PHOS  2.1* 2.8 2.3*  --   --   --     CBC  Recent Labs Lab 10/30/16 0415 10/31/16 0232 11/01/16 0340  WBC 3.0* 4.6 4.7  HGB 9.6* 9.5* 8.9*  HCT 30.9* 30.4* 29.4*  PLT 121* 104* 117*    Coag's No results for input(s): APTT, INR in the last 168 hours.  Sepsis Markers No results for input(s): LATICACIDVEN, PROCALCITON, O2SATVEN in the last 168 hours.  ABG  Recent Labs Lab 11/10/2016 1928  PHART 7.333*  PCO2ART 44.0  PO2ART 191*    Liver Enzymes  Recent Labs Lab 11/22/2016 0213 10/30/16 0415 10/31/16 0232  ALBUMIN 2.5* 2.1* 2.1*    Cardiac Enzymes No results for input(s): TROPONINI, PROBNP in the last 168 hours.  Glucose  Recent Labs Lab 11/02/16 1123 11/02/16 1540 11/02/16 1959 11/02/16 2332 11/03/16 0333 11/03/16 0806  GLUCAP 339* 378* 366* 378* 413* 303*    Imaging Dg Chest Port 1 View  Result Date: 11/02/2016 CLINICAL DATA:  Followup ventilator support. EXAM: PORTABLE CHEST 1 VIEW COMPARISON:  11/01/2016 FINDINGS: Endotracheal tube tip is 2 cm above the carina. Soft feeding tube enters the abdomen. Patchy infiltrate in both lower lobes left more than right is improved. No worsening or new findings. IMPRESSION:  Improved lower lobe pneumonia. Electronically Signed   By: Nelson Chimes M.D.   On: 11/02/2016 11:17     STUDIES:  EEG 9/27 >> lateralized periodic discharge Rt posterior quadrant with delta morphology EEG 10/9 >> diffuse encephalopathy, more severe cortical dysfunction of R hemisphere, no seizures   Echo 9/30 >> EF 55 to 60%, mod LVH, grade 1 DD, mild MR  MRI 10/8 >> Post-op changes surrounding biopsy site with residual mass and edema   CULTURES: Blood 10/01 >> negative Brain Bx 10/05 >>Grade III Glioma   ANTIBIOTICS:  SIGNIFICANT EVENTS: 09/27 Admit 10/05 Craniotomy with biopsy collected 10/8- not waking up, neuro re called  LINES/TUBES: ETT 10/05 >>  Lt femoral CVL 10/05 >>  ASSESSMENT / PLAN:  NEURO: Acute encephalopathy with  Lt arm weakness. Frontal lobe mass s/p craniotomy. Bx with Grade III Glioma  - wound care - AEDs per neurology - Cont Decadron, currently 4 mg IV q6hr  - JC Virus PCR pending   RESP Compromised airway, Acute resp failure -avoid positive balance to aid in weaning  -PRVC, continue to wean as able  -considering lasix, though pt does have hyperNa, may be able to tolerate given cerebral edema    ESRD. S/p renal transplant. Hypernatremia. -On D5W at 50 ml/hr -Monitor BMP  GI: GI bleed 10/03. Hyperammonemia- valproate may be contributing, no hx liver dz, dc'ed   - Protonix - Continue TF  CV: Hypertension, HLD. - Cardene gtt - goal SBP < 160 - difficult access in IJ, unable to relocate femoral line at this time   HEME: Anemia of critical illness, chronic disease, and GI bleed. Thrombocytopenia. DVT rt brachial vein - scd, subQ heparin - No full dose heparin until 2 wks out from bx   SUP - Protonix Nutrition - tube feeds, resistant SSI with additional TF coverage during steroids  Goals of care - full code  No family at bedside this morning   STAFF NOTE: I, Merrie Roof, MD FACP have personally reviewed patient's available data, including medical history, events of note, physical examination and test results as part of my evaluation. I have discussed with resident/NP and other care providers such as pharmacist, RN and RRT. In addition, I personally evaluated patient and elicited key findings of:pos 1.25 liters, more alert, NOT FC, moves ext rt to pain, jvd increased, lungs ronchi, abdo soft, edema increased, bilateral edema uppers, remains with Na 150, crt trend noted, Glioma on bx result, weaning attempt spoor, cpap /ps rising rr, I am concerned about his chances of liberation, plan is to continued to correct na, bmet in am, hold lasix further with trends noted, steroids to maintain, repeat wean ps 10 attempt, LP done, follow results but glioma now noted as etiology, feeding  pt, will discus with neuro prognosis concerns and wife, will discuss code status with wife when able, trach also would be futile, again cant use heparin drip for dvt until 10/19 ( 2 weeks from surgery date), await am dopplers , if pos then add filter likely, no other access, has fem line, obtain PIV if able then dc line, if needed can place a small cathter in IJ, remains on cardene, add oral agents to reduce to off if able, adding hydral, glu rise, add lantus, increase TF coverage The patient is critically ill with multiple organ systems failure and requires high complexity decision making for assessment and support, frequent evaluation and titration of therapies, application of advanced monitoring technologies and extensive interpretation of multiple databases.  Critical Care Time devoted to patient care services described in this note is 35 Minutes. This time reflects time of care of this signee: Merrie Roof, MD FACP. This critical care time does not reflect procedure time, or teaching time or supervisory time of PA/NP/Med student/Med Resident etc but could involve care discussion time. Rest per NP/medical resident whose note is outlined above and that I agree with   Lavon Paganini. Titus Mould, MD, [place] Pgr: [facility] 11/03/2016 10:55 AM

## 2016-11-03 NOTE — Progress Notes (Signed)
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 Baseline creatinine 1.5 and 1.7. Pred/prograf/myfortic. Other PMH hypertension, chronic combined systolic/diastolic CHF (54-09%), history of right kidney cancer. Presented to ED with left-sided weakness, admitted with sz- brain imaging possible/probable frontal lobe brain mass.Underwent a craniotomy and biopsy 10/5. Poor neurological function noted and CT head demonstrating post op changes. RUE brachial vein DVT noted for arm edema underwent LP to rule out CJ disease  Appears to be improved today  Objective:  Vital signs in last 24 hours:  Temp:  [98.1 F (36.7 C)-99.4 F (37.4 C)] 99 F (37.2 C) (10/10 0812) Pulse Rate:  [63-91] 74 (10/10 1030) Resp:  [18-36] 22 (10/10 1030) BP: (129-173)/(50-86) 159/65 (10/10 1030) SpO2:  [97 %-100 %] 99 % (10/10 1030) FiO2 (%):  [30 %] 30 % (10/10 1030) Weight:  [201 lb 1 oz (91.2 kg)] 201 lb 1 oz (91.2 kg) (10/10 0900)  Weight change:  Filed Weights   11/01/16 0345 11/02/16 0500 11/03/16 0900  Weight: 192 lb 0.3 oz (87.1 kg) 200 lb 13.4 oz (91.1 kg) 201 lb 1 oz (91.2 kg)    Intake/Output: I/O last 3 completed shifts: In: 5146.6 [I.V.:2327.6; NG/GT:2134; IV Piggyback:685] Out: 3390 [Urine:3390]   Intake/Output this shift:  Total I/O In: 656.2 [I.V.:311.2; NG/GT:130; IV Piggyback:215] Out: 300 [Urine:300]  CVS- RRR RS- CTA ABD- BS present soft non-distended EXT- edema noted RUE With DVT on ultrasound    Basic Metabolic Panel:  Recent Labs Lab 10/30/16 0415 10/31/16 0232 10/31/16 1419 11/01/16 0340 11/01/16 1456 11/02/16 0430 11/02/16 1700 11/03/16 0500  NA 152*  149* 148*  --  148*  --  149* 150* 150*  K 3.5  3.5 3.6  --  3.7  --  3.6 3.5 4.3   CL 121*  119* 118*  --  117*  --  117* 117* 115*  CO2 23  23 24   --  24  --  24 24 25   GLUCOSE 151*  154* 135*  --  191*  --  339* 391* 410*  BUN 38*  36* 26*  --  29*  --  33* 32* 36*  CREATININE 1.49*  1.52* 1.33*  --  1.14  --  1.13 1.00 1.29*  CALCIUM 8.6*  8.5* 8.4*  --  8.4*  --  8.9 9.2 9.6  MG  --   --  1.7 1.9 1.8  --   --  2.2  PHOS 2.7 2.5 2.1* 2.8 2.3*  --   --   --     Liver Function Tests:  Recent Labs Lab 11/13/2016 0213 10/30/16 0415 10/31/16 0232  ALBUMIN 2.5* 2.1* 2.1*   No results for input(s): LIPASE, AMYLASE in the last 168 hours.  Recent Labs Lab 11/01/16 1515 11/02/16 0430  AMMONIA 86* 132*    CBC:  Recent Labs Lab 11/09/2016 0213  11/12/2016 2122 11/17/2016 0222 10/30/16 0415 10/31/16 0232 11/01/16 0340  WBC 3.8*  < > 3.3* 3.7* 3.0* 4.6 4.7  NEUTROABS 3.1  --   --   --   --   --   --   HGB 13.5  < > 12.4* 12.0* 9.6* 9.5* 8.9*  HCT 42.5  < > 40.0 39.6 30.9* 30.4* 29.4*  MCV 91.2  < > 92.2 92.5 91.7 91.6 91.9  PLT 98*  < > 87* 83* 121* 104* 117*  < > = values in this interval not displayed.  Cardiac Enzymes: No results for input(s): CKTOTAL, CKMB, CKMBINDEX, TROPONINI in the last 168 hours.  BNP: Invalid input(s): POCBNP  CBG:  Recent Labs Lab 11/02/16 1540 11/02/16 1959 11/02/16 2332 11/03/16 0333 11/03/16 0806  GLUCAP 378* 366* 378* 32* 303*    Microbiology: Results for orders placed or performed during the hospital encounter of 10/12/2016  MRSA PCR Screening     Status: None   Collection Time: 10/23/16  1:58 AM  Result Value Ref Range Status   MRSA by PCR NEGATIVE NEGATIVE Final    Comment:        The GeneXpert MRSA Assay (FDA approved for NASAL specimens only), is one component of a comprehensive MRSA colonization surveillance program. It is not intended to diagnose MRSA infection nor to guide or monitor treatment for MRSA infections.   Culture, Urine     Status: Abnormal   Collection Time: 10/23/16 11:12 AM   Result Value Ref Range Status   Specimen Description URINE, RANDOM  Final   Special Requests NONE  Final   Culture MULTIPLE SPECIES PRESENT, SUGGEST RECOLLECTION (A)  Final   Report Status 10/24/2016 FINAL  Final  Culture, blood (Routine X 2) w Reflex to ID Panel     Status: None   Collection Time: 10/25/16 12:04 PM  Result Value Ref Range Status   Specimen Description BLOOD LEFT HAND  Final   Special Requests IN PEDIATRIC BOTTLE Blood Culture adequate volume  Final   Culture NO GROWTH 5 DAYS  Final   Report Status 10/30/2016 FINAL  Final  Culture, blood (Routine X 2) w Reflex to ID Panel     Status: None   Collection Time: 10/25/16 12:04 PM  Result Value Ref Range Status   Specimen Description BLOOD LEFT HAND  Final   Special Requests IN PEDIATRIC BOTTLE Blood Culture adequate volume  Final   Culture NO GROWTH 5 DAYS  Final   Report Status 10/30/2016 FINAL  Final  Fungus Culture With Stain     Status: None (Preliminary result)   Collection Time: 11/22/2016  3:18 PM  Result Value Ref Range Status   Fungus Stain Final report  Final    Comment: (NOTE) Performed At: [facility] [place], [place] [id number] Lindon Romp MD [id number]    Fungus (Mycology) Culture PENDING  Incomplete   Fungal Source TISSUE  Final  Acid Fast Smear (AFB)     Status: None   Collection Time: 11/11/2016  3:18 PM  Result Value Ref Range Status   AFB Specimen Processing Comment  Final    Comment: Tissue Grinding and Digestion/Decontamination   Acid Fast Smear Negative  Final    Comment: (NOTE) Performed At: [facility] [place], [place] [id number] Lindon Romp MD [id number]    Source (AFB) TISSUE  Final  Fungus Culture Result     Status: None   Collection Time: 11/13/2016  3:18 PM  Result Value Ref Range Status   Result 1 Comment  Final    Comment: (NOTE) KOH/Calcofluor preparation:  no fungus observed. Performed At: [facility] [place], [place] [id number] Lindon Romp MD [id number]   CSF culture with Stat gram stain     Status: None (Preliminary result)   Collection Time: 11/02/16 12:13 PM  Result Value Ref Range Status   Specimen Description CSF  Final   Special Requests NONE  Final   Gram Stain   Final    CYTOSPIN SMEAR WBC PRESENT, PREDOMINANTLY MONONUCLEAR NO ORGANISMS SEEN    Culture NO GROWTH < 24 HOURS  Final   Report Status PENDING  Incomplete    Coagulation Studies: No results for input(s): LABPROT, INR in the last 72 hours.  Urinalysis: No results for input(s): COLORURINE, LABSPEC, PHURINE, GLUCOSEU, HGBUR, BILIRUBINUR, KETONESUR, PROTEINUR, UROBILINOGEN, NITRITE, LEUKOCYTESUR in the last 72 hours.  Invalid input(s): APPERANCEUR    Imaging: Mr Jeri Cos FO Contrast  Result Date: 11/01/2016 CLINICAL DATA:  Initial evaluation for acute altered mental status status post recent biopsy of right frontal lobe lesion. EXAM: MRI HEAD WITHOUT AND WITH CONTRAST TECHNIQUE: Multiplanar, multiecho pulse sequences of the brain and surrounding structures were obtained without and with intravenous contrast. CONTRAST:  10 cc of MultiHance. COMPARISON:  Prior MRI from 11/01/2016. FINDINGS: Brain: Postoperative changes from recent posterior right frontal craniotomy for biopsy of previously identified posterior right frontal lobe mass. Heterogeneous T2/FLAIR signal intensity with postoperative blood products present within the resection cavity. Mild diffusion abnormality surrounding the resection cavity felt to be within normal limits for normal expected postoperative changes without evidence for significant ischemia or superimposed seizure activity. Smooth contiguous enhancement about the resection cavity felt to be within normal limits for normal expected postoperative changes. Residual T2/FLAIR signal abnormality within the adjacent posterior right frontal lesion consistent with known  lesion (series 6, image 22). Associated edema relatively similar to preoperative MRI. Small subdural collection overlying the right cerebral convexity measures up to 3 mm in thickness without significant mass effect, felt to be within normal limits for normal expected postoperative changes. Overlying postoperative dural thickening and enhancement. Single punctate diffusion abnormality located more anteriorly within the right frontal lobe favored to reflect small amount of postoperative blood products, possibly within the subarachnoid space, although a tiny punctate cortical infarct is not entirely excluded (series 3, image 36). Otherwise, appearance of the brain is stable. Gray-white matter differentiation otherwise maintained. No other evidence for acute or subacute ischemia. No other mass lesion or abnormal enhancement. Trace right-to-left midline shift of the septum pellucidum without significant midline shift. Basilar cisterns remain patent. Stable ventricular size without hydrocephalus. Pituitary gland suprasellar region normal. Midline structures intact and normal. Vascular: Major intracranial vascular flow voids are maintained. Skull and upper cervical spine: Craniocervical junction within normal limits. Multilevel degenerative spondylolysis noted within the upper cervical spine. Resultant mild spinal stenosis at C2-3. Bone marrow signal intensity is heterogeneous. Post craniotomy changes present at the posterior right frontal calvarium. Overlying skin staples. Sinuses/Orbits: Globes and orbital soft tissues within normal limits. Scattered mucosal thickening noted within the paranasal sinuses. Fluid layering within the posterior nasopharynx noted. Patient is intubated. Small bilateral mastoid effusions noted. Other: None. IMPRESSION: 1. Postoperative changes from recent right frontal craniotomy for biopsy/partial resection of posterior right frontal lobe mass. No complication. Residual mass surrounds the  resection cavity with similar localized edema. 2. Small postoperative extra-axial collection overlying the right cerebral convexity, felt to be within normal limits for normal expected postoperative changes. 3. Punctate diffusion abnormality within the anterior right frontal lobe, suspected to reflect a small amount of postoperative blood products within the subarachnoid space, although a tiny cortical infarct is not excluded. 4. No other acute intracranial abnormality. Electronically Signed   By: Jeannine Boga M.D.   On: 11/01/2016 19:33   Dg Chest Port 1 View  Result Date: 11/02/2016 CLINICAL DATA:  Followup  ventilator support. EXAM: PORTABLE CHEST 1 VIEW COMPARISON:  11/01/2016 FINDINGS: Endotracheal tube tip is 2 cm above the carina. Soft feeding tube enters the abdomen. Patchy infiltrate in both lower lobes left more than right is improved. No worsening or new findings. IMPRESSION: Improved lower lobe pneumonia. Electronically Signed   By: Nelson Chimes M.D.   On: 11/02/2016 11:17     Medications:   . dextrose 50 mL/hr at 11/03/16 0912  . feeding supplement (VITAL AF 1.2 CAL) 1,000 mL (11/03/16 0840)  . lacosamide (VIMPAT) IV 200 mg (11/03/16 1026)  . levETIRAcetam 500 mg (11/03/16 0600)  . levETIRAcetam Stopped (11/03/16 0913)  . niCARDipine 10 mg/hr (11/03/16 1022)  . propofol (DIPRIVAN) infusion 10 mcg/kg/min (11/03/16 0820)   . bethanechol  10 mg Oral TID  . chlorhexidine gluconate (MEDLINE KIT)  15 mL Mouth Rinse BID  . Chlorhexidine Gluconate Cloth  6 each Topical Q0600  . cholecalciferol  2,000 Units Oral Daily  . dexamethasone  4 mg Intravenous Q6H  . heparin subcutaneous  5,000 Units Subcutaneous Q12H  . hydrALAZINE  25 mg Oral Q8H  . insulin aspart  0-20 Units Subcutaneous Q4H  . insulin aspart  6 Units Subcutaneous Q4H  . insulin glargine  15 Units Subcutaneous Daily  . mouth rinse  15 mL Mouth Rinse 10 times per day  . metoprolol tartrate  50 mg Per Tube BID  .  mycophenolate  250 mg Oral BID  . pantoprazole sodium  40 mg Per Tube Q24H  . pravastatin  20 mg Per Tube q1800  . sodium chloride flush  10-40 mL Intracatheter Q12H  . sodium chloride flush  3 mL Intravenous Q12H  . tacrolimus  3 mg Oral BID   Or  . tacrolimus  1.5 mg Sublingual BID   acetaminophen **OR** acetaminophen, albuterol, bisacodyl, camphor-menthol, hydrALAZINE, HYDROmorphone (DILAUDID) injection, labetalol, levalbuterol, naLOXone (NARCAN)  injection, [DISCONTINUED] ondansetron **OR** ondansetron (ZOFRAN) IV, promethazine, senna-docusate, sodium chloride flush  Assessment/ Plan:  1. Frontal lobe lesion/left sided weakness/lethargy - s/p craniotomy. Path results Glioma 2. Vent - per CCM 3. Renal xpl #2 - BL creatinine 1.5-1.7. AKI on CKD (creatinine 2.29 on admission) on setting of hospitalization for possibly stroke/seizure/possible brain tumor with decreased mental status. Creatinine at baseline. Getting IV steroids (currently decadron for brain issues so pred on hold) and IV mycophenolate, SL prograf at 1/2 dose. TAC 4.7 on 9/30 (did not come back until 10/2) Reduced prograf dosing to 3 mg BID 10/2, and 1.5 mg BID 10/4 (SL); creatinine is stable 1. Repeated TAC level  4.7   4. Metabolic acidosis - resolved 5. Hypokalemia - replete prn 6. Hypernatremia - Gettingfree water via OGT. Slow improvement still 150 Discussed with critical care they are happy with mild hypernatremia  7. Systolic and diastolic HF 8. HTN - currently IV metoprolol 9. Assymetric RUE edema - [place] UE today brachial vein DVT (no extension into axillary v) -CCM aware. I would presume heparin contraindicated in light of recent craniotomy 10. Post op Hb variation - Hb 12->9.6 >8.9. Trend   LOS: 61 Jaana Brodt W @TODAY @11 :17 AM

## 2016-11-03 NOTE — Progress Notes (Signed)
   11/03/16 1000  Clinical Encounter Type  Visited With Patient not available  Visit Type Spiritual support  Referral From Chaplain  Consult/Referral To Chaplain  Visited PT during rounding.  Pt is not able to speak but Chaplain was drawn to room to pray for the PT.  Chaplain stood by the door to offer a prayer of comfort and concern.  Several times Chaplain has come by but no one was present so prayers were given.

## 2016-11-03 NOTE — Progress Notes (Signed)
Dr. Titus Mould gave verbal order to try for peripheral IV. If able to obtain this then could d/c femoral line. IV team attempted twice to get peripheral in left arm without success. Unable to stick right arm due to dvt.

## 2016-11-03 NOTE — Progress Notes (Addendum)
Notified CCM MD that CBG is over 400 at this time to assess need for glucostabilizer. MD changing insulin orders. Will continue to monitor patient.

## 2016-11-03 NOTE — Progress Notes (Signed)
[facility] Progress Note Patient Name: Roger Gibson DOB: 1945-10-28 MRN: [id number]   Date of Service  11/03/2016  HPI/Events of Note    eICU Interventions  SSi changed to resistant scale     Intervention Category Intermediate Interventions: Hyperglycemia - evaluation and treatment  Jobina Maita S. 11/03/2016, 3:40 AM

## 2016-11-04 DIAGNOSIS — C711 Malignant neoplasm of frontal lobe: Principal | ICD-10-CM

## 2016-11-04 LAB — CBC
HEMATOCRIT: 25.5 % — AB (ref 39.0–52.0)
HEMOGLOBIN: 8 g/dL — AB (ref 13.0–17.0)
MCH: 28.4 pg (ref 26.0–34.0)
MCHC: 31.4 g/dL (ref 30.0–36.0)
MCV: 90.4 fL (ref 78.0–100.0)
Platelets: 259 10*3/uL (ref 150–400)
RBC: 2.82 MIL/uL — AB (ref 4.22–5.81)
RDW: 14.4 % (ref 11.5–15.5)
WBC: 6.7 10*3/uL (ref 4.0–10.5)

## 2016-11-04 LAB — BASIC METABOLIC PANEL
ANION GAP: 4 — AB (ref 5–15)
ANION GAP: 4 — AB (ref 5–15)
BUN: 35 mg/dL — AB (ref 6–20)
BUN: 35 mg/dL — ABNORMAL HIGH (ref 6–20)
CALCIUM: 9.5 mg/dL (ref 8.9–10.3)
CO2: 25 mmol/L (ref 22–32)
CO2: 24 mmol/L (ref 22–32)
CREATININE: 0.88 mg/dL (ref 0.61–1.24)
Calcium: 9.5 mg/dL (ref 8.9–10.3)
Chloride: 120 mmol/L — ABNORMAL HIGH (ref 101–111)
Chloride: 121 mmol/L — ABNORMAL HIGH (ref 101–111)
Creatinine, Ser: 0.84 mg/dL (ref 0.61–1.24)
GFR calc Af Amer: >60 mL/min (ref >60)
GFR calc Af Amer: >60 mL/min (ref >60)
GFR calc non Af Amer: >60 mL/min (ref >60)
GLUCOSE: 310 mg/dL — AB (ref 65–99)
GLUCOSE: 289 mg/dL — AB (ref 65–99)
Potassium: 4.1 mmol/L (ref 3.5–5.1)
Potassium: 4 mmol/L (ref 3.5–5.1)
Sodium: 149 mmol/L — ABNORMAL HIGH (ref 135–145)
Sodium: 149 mmol/L — ABNORMAL HIGH (ref 135–145)

## 2016-11-04 LAB — GLUCOSE, CAPILLARY
GLUCOSE-CAPILLARY: 290 mg/dL — AB (ref 65–99)
Glucose-Capillary: 208 mg/dL — ABNORMAL HIGH (ref 65–99)
Glucose-Capillary: 224 mg/dL — ABNORMAL HIGH (ref 65–99)
Glucose-Capillary: 233 mg/dL — ABNORMAL HIGH (ref 65–99)
Glucose-Capillary: 281 mg/dL — ABNORMAL HIGH (ref 65–99)
Glucose-Capillary: 308 mg/dL — ABNORMAL HIGH (ref 65–99)

## 2016-11-04 LAB — COMPREHENSIVE METABOLIC PANEL
ALK PHOS: 62 U/L (ref 38–126)
ALT: 43 U/L (ref 17–63)
ANION GAP: 8 (ref 5–15)
AST: 68 U/L — ABNORMAL HIGH (ref 15–41)
Albumin: 1.9 g/dL — ABNORMAL LOW (ref 3.5–5.0)
BILIRUBIN TOTAL: 0.4 mg/dL (ref 0.3–1.2)
BUN: 36 mg/dL — ABNORMAL HIGH (ref 6–20)
CALCIUM: 9.7 mg/dL (ref 8.9–10.3)
CO2: 22 mmol/L (ref 22–32)
Chloride: 119 mmol/L — ABNORMAL HIGH (ref 101–111)
Creatinine, Ser: 0.88 mg/dL (ref 0.61–1.24)
Glucose, Bld: 285 mg/dL — ABNORMAL HIGH (ref 65–99)
Potassium: 4.2 mmol/L (ref 3.5–5.1)
SODIUM: 149 mmol/L — AB (ref 135–145)
TOTAL PROTEIN: 5.5 g/dL — AB (ref 6.5–8.1)

## 2016-11-04 LAB — TRIGLYCERIDES: Triglycerides: 148 mg/dL (ref <150)

## 2016-11-04 LAB — MAGNESIUM: Magnesium: 1.9 mg/dL (ref 1.7–2.4)

## 2016-11-04 LAB — PHOSPHORUS: PHOSPHORUS: 1.7 mg/dL — AB (ref 2.5–4.6)

## 2016-11-04 MED ORDER — HYDRALAZINE HCL 50 MG PO TABS
75.0000 mg | ORAL_TABLET | Freq: Three times a day (TID) | ORAL | Status: DC
  Administered 2016-11-04 – 2016-11-05 (×3): 75 mg via ORAL
  Filled 2016-11-04 (×3): qty 1

## 2016-11-04 MED ORDER — LACTULOSE 10 GM/15ML PO SOLN
30.0000 g | Freq: Three times a day (TID) | ORAL | Status: DC
  Administered 2016-11-04 – 2016-11-12 (×25): 30 g via ORAL
  Filled 2016-11-04 (×25): qty 45

## 2016-11-04 NOTE — Progress Notes (Signed)
PULMONARY / CRITICAL CARE MEDICINE   Name: Roger Gibson MRN: [id number] DOB: 01/15/1946    ADMISSION DATE:  10/19/2016 CONSULTATION DATE:  11/20/2016  REFERRING MD: Dr. Wynelle Cleveland, [facility]  CHIEF COMPLAINT:  Lt arm weakness  HISTORY OF PRESENT ILLNESS:   71 yo male with Lt arm weakness and found to have frontal lobe mass.  Had craniotomy 10/05 and remained on vent post op.  PMHx of ESRD, HTN, combined CHF, Rt renal cell cancer s/p transplant.  SUBJECTIVE:  Remains on ventilator.  Pt IV access difficult, still has femoral central line but unable to peripheral IV access by IV team.  Glucose remains elevated above goal after insulin increases.  Net positive 2.19 L   VITAL SIGNS: BP (!) 144/60   Pulse 64   Temp 98.3 F (36.8 C) (Axillary)   Resp 18   Ht 5\' 10"  (1.778 m)   Wt 208 lb 8.9 oz (94.6 kg)   SpO2 99%   BMI 29.92 kg/m   VENTILATOR SETTINGS: Vent Mode: PRVC FiO2 (%):  [30 %] 30 % Set Rate:  [18 bmp] 18 bmp Vt Set:  [610 mL] 610 mL PEEP:  [5 cmH20] 5 cmH20 Pressure Support:  [12 cmH20] 12 cmH20 Plateau Pressure:  [17 cmH20-23 cmH20] 20 cmH20  INTAKE / OUTPUT: I/O last 3 completed shifts: In: 7156 [I.V.:4411; NG/GT:2080; IV Piggyback:665] Out: 3818 [Urine:4025]  PHYSICAL EXAMINATION:  General: Remains on vent Neuro: Opens eyes, not fc  HEENT: ett secured in place, cranial surgical wound PULM: Coarse anterior breath sounds  CV:  RRR, s1, s2  GI: soft, +BS Extremities: Bilateral UE edema    LABS:  BMET  Recent Labs Lab 11/03/16 0500 11/03/16 1822 11/04/16 0404  NA 150* 148* 149*  K 4.3 4.2 4.1  CL 115* 121* 120*  CO2 25 23 25   BUN 36* 36* 35*  CREATININE 1.29* 0.96 0.88  GLUCOSE 410* 362* 310*    Electrolytes  Recent Labs Lab 11/01/16 0340 11/01/16 1456  11/03/16 0500 11/03/16 1822 11/04/16 0404  CALCIUM 8.4*  --   < > 9.6 9.4 9.5  MG 1.9 1.8  --  2.2  --  1.9  PHOS 2.8 2.3*  --   --   --  1.7*  < > = values in this interval not  displayed.  CBC  Recent Labs Lab 10/31/16 0232 11/01/16 0340 11/04/16 0404  WBC 4.6 4.7 6.7  HGB 9.5* 8.9* 8.0*  HCT 30.4* 29.4* 25.5*  PLT 104* 117* 259    Coag's No results for input(s): APTT, INR in the last 168 hours.  Sepsis Markers No results for input(s): LATICACIDVEN, PROCALCITON, O2SATVEN in the last 168 hours.  ABG  Recent Labs Lab 11/20/2016 1928  PHART 7.333*  PCO2ART 44.0  PO2ART 191*    Liver Enzymes  Recent Labs Lab 10/30/16 0415 10/31/16 0232  ALBUMIN 2.1* 2.1*    Cardiac Enzymes No results for input(s): TROPONINI, PROBNP in the last 168 hours.  Glucose  Recent Labs Lab 11/03/16 0806 11/03/16 1149 11/03/16 1546 11/03/16 2009 11/03/16 2333 11/04/16 0341  GLUCAP 303* 311* 281* 324* 353* 308*    Imaging No results found.   STUDIES:  EEG 9/27 >> lateralized periodic discharge Rt posterior quadrant with delta morphology EEG 10/9 >> diffuse encephalopathy, more severe cortical dysfunction of R hemisphere, no seizures   Echo 9/30 >> EF 55 to 60%, mod LVH, grade 1 DD, mild MR  MRI 10/8 >> Post-op changes surrounding biopsy site with residual mass  and edema   CULTURES: Blood 10/01 >> negative Brain Bx 10/05 >>Grade III Glioma   ANTIBIOTICS: None  SIGNIFICANT EVENTS: 09/27 Admit 10/05 Craniotomy with biopsy collected 10/8- not waking up, neuro re called  LINES/TUBES: ETT 10/05 >>  Lt femoral CVL 10/05 >>  ASSESSMENT / PLAN:  NEURO: Acute encephalopathy with Lt arm weakness. Frontal lobe mass s/p craniotomy. Bx with Grade III Glioma  - wound care for surgical site  - AEDs per neurology - Cont Decadron, currently 4 mg IV q6hr  - F/u JC Virus PCR though biopsy proven glioma  RESP Compromised airway, Acute resp failure -still with positive fluid balance   -PRVC, continue to wean as able   ESRD. S/p renal transplant. Hypernatremia. -On D5W at 75 ml/hr for correction, Na currently 149  -Monitor BMP  GI: GI bleed  10/03. Hyperammonemia - Protonix - Continue TF per protocol  - Lantus, Novolog TF coverage, SSI, may need further increase   CV: Hypertension, HLD. - Cardene gtt, attempt to wean to oral medications- hydral, labetalol  - goal SBP < 160 - IV team unable to place peripheral IV, eval for other access options   HEME: Anemia of critical illness, chronic disease, and GI bleed. Thrombocytopenia. DVT rt brachial vein - LE Dopplers negative for DVT on prelim  - scd, subQ heparin - No full dose heparin until 2 wks out from bx   SUP - Protonix Nutrition - tube feeds, resistant SSI with additional TF coverage during steroids  Goals of care - full code  No family at bedside 10/11   STAFF NOTE: I, Merrie Roof, MD FACP have personally reviewed patient's available data, including medical history, events of note, physical examination and test results as part of my evaluation. I have discussed with resident/NP and other care providers such as pharmacist, RN and RRT. In addition, I personally evaluated patient and elicited key findings of: more awake, not fc, coughing , ronchi, abdo soft, fem line clean, upper ext edema remains, lowers also present, glioma note don path, added hydral and labetolol in hopes to titrate Cardene to off, max these agents to dc drip if able,  With prior ammonia and normal liver fxn but poor awakening want to re add lactulose and increase d5w to correct na further then follow status neuro, wean attempts ps 10-12 if able, has not had a lot of success and neurostatus ha snot improved enough o protect airway either, gloma to be assessed by neuro onc today , feeding, I updated wife in room, sub q hep okay, add hep IV on 10 /19 for dvt upper, prelim lower neg, no filter required I hope The patient is critically ill with multiple organ systems failure and requires high complexity decision making for assessment and support, frequent evaluation and titration of therapies,  application of advanced monitoring technologies and extensive interpretation of multiple databases.   Critical Care Time devoted to patient care services described in this note is 30 Minutes. This time reflects time of care of this signee: Merrie Roof, MD FACP. This critical care time does not reflect procedure time, or teaching time or supervisory time of PA/NP/Med student/Med Resident etc but could involve care discussion time. Rest per NP/medical resident whose note is outlined above and that I agree with   Lavon Paganini. Titus Mould, MD, [facility] Pgr: [facility] 11/04/2016 10:00 AM

## 2016-11-04 NOTE — Consult Note (Signed)
[place] NOTE  Patient Care Team: [name], MD as PCP - General (Internal Medicine) Donato Heinz, MD as Consulting Physician (Nephrology)  CHIEF COMPLAINTS/PURPOSE OF CONSULTATION:  Acute encephalopathy Brain tumor  HISTORY OF PRESENTING ILLNESS:  Roger Gibson 71 y.o. male presented initially on 9/27 with left sided weakness, confusion from likely seizure and post-ictal todd's, accompanied by confusion.  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    His hospital course has been complicated by significant morbidity from sepsis, poor mentation, anasarca, acute on chronic renal disease.  He remains intubated in critical care setting.  He is currently on Keppra, Vimpat, and high dose decadron.    Today I presented to review his pathology and treatment options moving forward for his glioma.  History and plans were discussed with wife given his mental status at present.   MEDICAL HISTORY:  Past Medical History:  Diagnosis Date  . Acute focal neurological deficit 09/2016  . Allergy   . Blood transfusion without reported diagnosis   . Cancer ([place])    right kidney ca  . Confusion    during admission to [facility] 2012, persisted after discharge  . ESRD (end stage renal disease) ([place])   . ESRD (end stage renal disease) on dialysis ([place])    T, Th, Sat HD in [place], [place]  . Gout 01/2002   Right knee; Left great toe  . HLD (hyperlipidemia) 1999  . HTN (hypertension) 1974  . Iron deficiency anemia    PRE 2004/06/2000  . Renal cysts, acquired, bilateral    per Darol Destine    SURGICAL HISTORY: Past Surgical History:  Procedure Laterality Date  . [place] OF CRANIAL NAVIGATION N/A 11/20/2016   Procedure: APPLICATION OF CRANIAL NAVIGATION;  Surgeon:  Ditty, Kevan Ny, MD;  Location: [place];  Service: Neurosurgery;  Laterality: N/A;  . COLONOSCOPY  05/15/03   Polyp (biopsy negative) repeat in 5 years  . COLONOSCOPY  07/18/08   1 rectal polyp (Dr. Wynetta Emery) 5 years  . CRANIOTOMY Right 10/25/2016   Procedure: Right frontal craniotomy for mass resection with Brainlab;  Surgeon: Ditty, Kevan Ny, MD;  Location: [facility];  Service: Neurosurgery;  Laterality: Right;  right   . CT ABD W & PELVIS WO CM  03/03/06   Right kidney absent; Left with cystic changes  . CYSTOSCOPY  03/03/06   Mild to mod. BPH observed/ otherwise normal  . 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 MR MRA ABDOMEN  2000   3x3 mass left kidney  . NEPHRECTOMY  1965   Left (infection) [place]  . NEPHRECTOMY  2000   partial-(2nd to renal carcinoma) Dr. [place] Crane [facility]  . RADIOLOGY WITH ANESTHESIA N/A 11/20/2016   Procedure: MRI of the Brain;  Surgeon: Greggory Keen, MD;  Location: [facility];  Service: Radiology;  Laterality: N/A;  . RADIOLOGY WITH ANESTHESIA N/A 11/08/2016   Procedure: MRI BRAIN;  Surgeon: Radiologist, Medication, MD;  Location: [facility];  Service: Radiology;  Laterality: N/A;    SOCIAL HISTORY: Social History   Social History  . Marital status: Married    Spouse name: N/A  . Number of children: 3  . Years of education: N/A  Occupational History  . Retired-policeman    Social History Main Topics  . Smoking status: Former Smoker    Types: Cigarettes, Cigars    Quit date: 01/25/1998  . Smokeless tobacco: Never Used     Comment: occasional cigar  . Alcohol use No     Comment: very rare  . Drug use: No  . Sexual activity: Not on file   Other Topics Concern  . Not on file   Social History Narrative   Separated from wife [id number]      Retired Higher education careers adviser, was in [place]      In [place] since [place] to golf      3 children out of  home    FAMILY HISTORY: Family History  Problem Relation Age of Onset  . Stomach cancer Father        ?  Marland Kitchen Hypertension Mother        On dialysis for years  . Hypertension Brother   . Hypertension Brother   . Hypertension Brother   . Hypertension Sister   . Breast cancer Sister        s/p mastectomy  . COPD Sister   . Hypertension Sister   . Kidney failure Sister        on dialysis  . Heart attack Paternal Grandfather   . Colon cancer Neg Hx   . Esophageal cancer Neg Hx   . Rectal cancer Neg Hx     ALLERGIES:  is allergic to iodinated diagnostic agents; iodine; and tape.  MEDICATIONS:  Current Facility-Administered Medications  Medication Dose Route Frequency Provider Last Rate Last Dose  . acetaminophen (TYLENOL) tablet 650 mg  650 mg Per Tube Q4H PRN Chesley Mires, MD   650 mg at 11/01/16 1225   Or  . acetaminophen (TYLENOL) suppository 650 mg  650 mg Rectal Q4H PRN Chesley Mires, MD      . albuterol (PROVENTIL) (2.5 MG/3ML) 0.083% nebulizer solution 2.5 mg  2.5 mg Nebulization Q6H PRN Rizwan, Saima, MD      . bethanechol (URECHOLINE) tablet 10 mg  10 mg Oral TID Susa Raring, RPH   10 mg at 11/04/16 1009  . bisacodyl (DULCOLAX) suppository 10 mg  10 mg Rectal Daily PRN Opyd, Ilene Qua, MD   10 mg at 10/26/16 0511  . camphor-menthol (SARNA) lotion   Topical PRN Jani Gravel, MD   1 application at 15/40/08 0055  . chlorhexidine gluconate (MEDLINE KIT) (PERIDEX) 0.12 % solution 15 mL  15 mL Mouth Rinse BID Ditty, Kevan Ny, MD   15 mL at 11/04/16 0817  . Chlorhexidine Gluconate Cloth 2 % PADS 6 each  6 each Topical Q0600 Sampson Goon, MD   6 each at 11/04/16 0030  . cholecalciferol (VITAMIN D) tablet 2,000 Units  2,000 Units Oral Daily Opyd, Ilene Qua, MD   2,000 Units at 11/04/16 1008  . dexamethasone (DECADRON) injection 4 mg  4 mg Intravenous Q6H Tawny Asal, MD   4 mg at 11/04/16 1157  . dextrose 5 % solution   Intravenous Continuous Kathi Ludwig, MD 100  mL/hr at 11/04/16 1100    . feeding supplement (VITAL AF 1.2 CAL) liquid 1,000 mL  1,000 mL Per Tube Continuous Raylene Miyamoto, MD 65 mL/hr at 11/04/16 1100 1,000 mL at 11/04/16 1100  . heparin injection 5,000 Units  5,000 Units Subcutaneous Q12H Tawny Asal, MD   5,000 Units at 11/04/16  1009  . hydrALAZINE (APRESOLINE) injection 10 mg  10 mg Intravenous Q4H PRN Tawny Asal, MD   10 mg at 11/03/16 1410  . hydrALAZINE (APRESOLINE) tablet 75 mg  75 mg Oral Q8H Harbrecht, Lawrence, MD      . HYDROmorphone (DILAUDID) injection 0.5-1 mg  0.5-1 mg Intravenous Q2H PRN Costella, Vincent J, PA-C      . insulin aspart (novoLOG) injection 0-20 Units  0-20 Units Subcutaneous Q4H Collene Gobble, MD   7 Units at 11/04/16 0817  . insulin aspart (novoLOG) injection 6 Units  6 Units Subcutaneous Q4H Tawny Asal, MD   6 Units at 11/04/16 [phone]  . insulin glargine (LANTUS) injection 15 Units  15 Units Subcutaneous Daily Tawny Asal, MD   15 Units at 11/04/16 1009  . labetalol (NORMODYNE,TRANDATE) injection 10-40 mg  10-40 mg Intravenous Q10 min PRN Costella, Vincent J, PA-C   40 mg at 11/02/16 0458  . lacosamide (VIMPAT) 200 mg in sodium chloride 0.9 % 25 mL IVPB  200 mg Intravenous Q12H Greta Doom, MD   Stopped at 11/04/16 1159  . lactulose (CHRONULAC) 10 GM/15ML solution 30 g  30 g Oral TID Kathi Ludwig, MD   30 g at 11/04/16 0959  . levalbuterol (XOPENEX) nebulizer solution 0.63 mg  0.63 mg Nebulization Q6H PRN Chesley Mires, MD      . levETIRAcetam (KEPPRA) 500 mg in sodium chloride 0.9 % 100 mL IVPB  500 mg Intravenous Q12H Costella, Vista Mink, PA-C   Stopped at 11/04/16 0423  . levETIRAcetam (KEPPRA) 750 mg in sodium chloride 0.9 % 100 mL IVPB  750 mg Intravenous Q12H Greta Doom, MD   Stopped at 11/04/16 1024  . MEDLINE mouth rinse  15 mL Mouth Rinse 10 times per day Ditty, Kevan Ny, MD   15 mL at 11/04/16 1204  . metoprolol tartrate (LOPRESSOR) 25 mg/10 mL oral  suspension 50 mg  50 mg Per Tube BID Chesley Mires, MD   50 mg at 11/04/16 1007  . mycophenolate (CELLCEPT) oral suspension 50 mg/mL  250 mg Oral BID Susa Raring, RPH   250 mg at 11/04/16 4540  . naloxone [facility]) injection 0.08 mg  0.08 mg Intravenous PRN Costella, Vista Mink, PA-C      . nicardipine (CARDENE) 67m in 0.83% saline 2038mIV DOUBLE STRENGTH infusion (0.2 mg/ml)  3-15 mg/hr Intravenous Continuous FeRaylene MiyamotoMD 37.5 mL/hr at 11/04/16 1100 7.5 mg/hr at 11/04/16 1100  . ondansetron (ZOFRAN) injection 4 mg  4 mg Intravenous Q4H PRN Costella, Vincent J, PA-C      . pantoprazole sodium (PROTONIX) 40 mg/20 mL oral suspension 40 mg  40 mg Per Tube Q24H SoChesley MiresMD   40 mg at 11/04/16 1157  . pravastatin (PRAVACHOL) tablet 20 mg  20 mg Per Tube [id number] MiresMD   20 mg at 11/03/16 1628  . promethazine (PHENERGAN) tablet 12.5-25 mg  12.5-25 mg Per Tube Q4H PRN SoChesley MiresMD      . propofol (DIPRIVAN) 1000 MG/100ML infusion  0-50 mcg/kg/min Intravenous Continuous GrSampson GoonMD 2.8 mL/hr at 11/04/16 1100 5.018 mcg/kg/min at 11/04/16 1100  . senna-docusate (Senokot-S) tablet 1 tablet  1 tablet Per Tube QHS PRN SoChesley MiresMD      . sodium chloride flush (NS) 0.9 % injection 10-40 mL  10-40 mL Intracatheter Q12H Ditty, BeKevan NyMD   10 mL at 11/04/16 1012  . sodium chloride flush (NS) 0.9 % injection 10-40  mL  10-40 mL Intracatheter PRN Ditty, Kevan Ny, MD      . sodium chloride flush (NS) 0.9 % injection 3 mL  3 mL Intravenous Q12H Opyd, Ilene Qua, MD   3 mL at 11/04/16 1011  . tacrolimus (PROGRAF) capsule 3 mg  3 mg Oral BID Wynell Balloon, RPH   3 mg at 11/04/16 1016   Or  . tacrolimus (PROGRAF) capsule 1.5 mg  1.5 mg Sublingual BID Wynell Balloon, RPH        REVIEW OF SYSTEMS:   Limited by intubation, poor mentation  PHYSICAL EXAMINATION: Vitals:   11/04/16 1200 11/04/16 1221  BP: (!) 159/68   Pulse: 64   Resp: (!) 26   Temp:  98.2  F (36.8 C)  SpO2: 100%    KPS: 40. General: Intubated, sedated Head: Craniotomy scar noted, dry and intact. EENT: No conjunctival injection or scleral icterus. Oral mucosa moist Lungs: Tachypneic Cardiac: Tachycardic Abdomen: Distended Skin: No rashes cyanosis or petechiae. Extremities: Significant edema and anasarca  NEUROLOGIC EXAM: Mental Status: Intubated, sedated on propofol.  Eyes open to loud voice and tactile stimulation but no purposeful response.  Deferred application of painful stimuli. Cranial Nerves: EOMI to head turning.  Pupils reactive, gag noted. Motor: Limited by sedation and significant edema.  No purposeful withdrawal noted of any limbs.    LABORATORY DATA:  I have reviewed the data as listed Lab Results  Component Value Date   WBC 6.7 11/04/2016   HGB 8.0 (L) 11/04/2016   HCT 25.5 (L) 11/04/2016   MCV 90.4 11/04/2016   PLT 259 11/04/2016    Recent Labs  10/26/16 0144 11/04/2016 0516  10/30/16 0415 10/31/16 0232  11/03/16 1822 11/04/16 0404 11/04/16 1012  NA 155* 155*  < > 152*  149* 148*  < > 148* 149* 149*  K 3.8 3.4*  < > 3.5  3.5 3.6  < > 4.2 4.1 4.2  CL 120* 121*  < > 121*  119* 118*  < > 121* 120* 119*  CO2 26 24  < > _0 < > _1 GLUCOSE 221* 198*  < > 151*  154* 135*  < > 362* 310* 285*  BUN 34* 30*  < > 38*  36* 26*  < > 36* 35* 36*  CREATININE 1.71* 1.34*  < > 1.49*  1.52* 1.33*  < > 0.96 0.88 0.88  CALCIUM 10.1 10.0  < > 8.6*  8.5* 8.4*  < > 9.4 9.5 9.7  GFRNONAA 38* 51*  < > 45*  44* 52*  < > >60 >60 >60  GFRAA 44* 59*  < > 52*  51* >60  < > >60 >60 >60  PROT 6.9 6.1*  --   --   --   --   --   --  5.5*  ALBUMIN 2.8* 2.5*  < > 2.1* 2.1*  --   --   --  1.9*  AST 113* 66*  --   --   --   --   --   --  68*  ALT 62 52  --   --   --   --   --   --  43  ALKPHOS 56 62  --   --   --   --   --   --  62  BILITOT 0.8 0.9  --   --   --   --   --   --  0.4  < > = values in this interval not displayed.  RADIOGRAPHIC  STUDIES: I have personally reviewed the radiological images as listed and agreed with the findings in the report.  CLINICAL DATA:  Left-sided weakness  EXAM: MRI HEAD WITHOUT CONTRAST  TECHNIQUE: Multiplanar, multiecho pulse sequences of the brain and surrounding structures were obtained without intravenous contrast.  COMPARISON:  Brain MRI 10/22/2016 and head CT 10/22/2016  FINDINGS: The study is degraded by motion, despite efforts to reduce this artifact, including utilization of motion-resistant MR sequences. The findings of the study are interpreted in the context of reduced sensitivity/specificity.  Brain: The midline structures are normal. There is expansion of the right superior frontal gyrus with associated hyperintense T2 weighted signal. No corresponding diffusion restriction. Brain parenchymal signal is otherwise normal. Moderate diffuse atrophy. The dura is normal and there is no extra-axial collection.  Vascular: Limited visualization of flow voids is normal.  Skull and upper cervical spine: The visualized skull base, calvarium, upper cervical spine and extracranial soft tissues are normal.  Sinuses/Orbits: No fluid levels or advanced mucosal thickening. No mastoid or middle ear effusion. Normal orbits.  IMPRESSION: 1. Markedly motion degraded examination. 2. Expanded, T2 hyperintense right superior frontal gyrus. The appearance is most suggestive of a neoplastic lesion. However, characterization is again poor due to patient motion. A follow-up examination with more adequate sedation (or anesthesia) and the administration of intravenous contrast material (if not contraindicated) would likely provide better characterization. 3. No diffusion restriction or other evidence of acute ischemia.   Electronically Signed   By: Ulyses Jarred M.D.   On: 10/26/2016 16:12  Pathology:    ASSESSMENT & PLAN:  1. WHO Grade III Glioma 2.  Encephalopathy  From where he stands today, Mr. Sanguinetti needs to accomplish significant recovery prior to [place] recommending any formal treatment for his glioma.    Still, it is important that in the meantime we submit his tissue for IDH-1 mutation testing and 1p/19q co-deletion status.  The microscopic and radiographic appearance, combined with his complete resection, could theoretically portend a much better prognosis than the typical high grade glioma, especially if his IDH-status is mutated.  Right now he is more limited by his acute and chronic co-morbidities.    His case will be presented and discussed in multi-disciplinary tumor board on 11/08/16.  We will follow his hospital course peripherally and stay in touch with the family for direction, follow-up and support.  All questions were answered. The patient's spouse knows to call the clinic with any problems, questions or concerns. I spent 40 minutes counseling the patient face to face. The total floor time spent was 80 minutes.     Ventura Sellers, MD 11/04/2016 12:46 PM

## 2016-11-04 NOTE — Progress Notes (Signed)
No acute events Awake, not following commands Discussed diagnosis with patient's wife Discussed with Dr. Sherrilee Gilles who will see patient tonight

## 2016-11-04 NOTE — Progress Notes (Signed)
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 Baseline creatinine 1.5 and 1.7. Pred/prograf/myfortic. Other PMH hypertension, chronic combined systolic/diastolic CHF (97-98%), history of right kidney cancer. Presented to ED with left-sided weakness, admitted with sz- brain imaging possible/probable frontal lobe brain mass.Underwent a craniotomy and biopsy 10/5. Poor neurological function noted and CT head demonstrating post op changes. RUE brachial vein DVT noted for arm edema underwent LP to rule out CJ disease  Appears to be improved today although puffy and generally edematous  Objective:  Vital signs in last 24 hours:  Temp:  [98.3 F (36.8 C)-99 F (37.2 C)] 98.3 F (36.8 C) (10/11 0746) Pulse Rate:  [64-97] 78 (10/11 0833) Resp:  [17-32] 23 (10/11 0833) BP: (144-177)/(54-85) 165/77 (10/11 0833) SpO2:  [98 %-100 %] 99 % (10/11 0833) FiO2 (%):  [30 %] 30 % (10/11 0833) Weight:  [208 lb 8.9 oz (94.6 kg)] 208 lb 8.9 oz (94.6 kg) (10/11 0420)  Weight change:  Filed Weights   11/02/16 0500 11/03/16 0900 11/04/16 0420  Weight: 200 lb 13.4 oz (91.1 kg) 201 lb 1 oz (91.2 kg) 208 lb 8.9 oz (94.6 kg)    Intake/Output: I/O last 3 completed shifts: In: 7156 [I.V.:4411; NG/GT:2080; IV Piggyback:665] Out: 4025 [Urine:4025]   Intake/Output this shift:  No intake/output data recorded.  CVS- RRR RS- CTA ABD- BS present soft non-distended EXT- edema noted RUE With DVT on ultrasound    Basic Metabolic Panel:  Recent Labs Lab 10/31/16 0232 10/31/16 1419 11/01/16 0340 11/01/16 1456 11/02/16 0430 11/02/16 1700 11/03/16 0500 11/03/16 1822 11/04/16 0404  NA 148*  --  148*  --  149* 150* 150* 148* 149*  K 3.6  --  3.7  --  3.6 3.5 4.3 4.2 4.1   CL 118*  --  117*  --  117* 117* 115* 121* 120*  CO2 24  --  24  --  24 24 25 23 25   GLUCOSE 135*  --  191*  --  339* 391* 410* 362* 310*  BUN 26*  --  29*  --  33* 32* 36* 36* 35*  CREATININE 1.33*  --  1.14  --  1.13 1.00 1.29* 0.96 0.88  CALCIUM 8.4*  --  8.4*  --  8.9 9.2 9.6 9.4 9.5  MG  --  1.7 1.9 1.8  --   --  2.2  --  1.9  PHOS 2.5 2.1* 2.8 2.3*  --   --   --   --  1.7*    Liver Function Tests:  Recent Labs Lab 10/30/16 0415 10/31/16 0232  ALBUMIN 2.1* 2.1*   No results for input(s): LIPASE, AMYLASE in the last 168 hours.  Recent Labs Lab 11/01/16 1515 11/02/16 0430  AMMONIA 86* 132*    CBC:  Recent Labs Lab 11/07/2016 0222 10/30/16 0415 10/31/16 0232 11/01/16 0340 11/04/16 0404  WBC 3.7* 3.0* 4.6 4.7 6.7  HGB 12.0* 9.6* 9.5* 8.9* 8.0*  HCT 39.6 30.9* 30.4* 29.4* 25.5*  MCV 92.5 91.7 91.6 91.9 90.4  PLT 83* 121* 104* 117* 259    Cardiac Enzymes: No results for input(s): CKTOTAL, CKMB, CKMBINDEX, TROPONINI in the last 168 hours.  BNP: Invalid input(s): POCBNP  CBG:  Recent Labs Lab 11/03/16 1546 11/03/16 2009 11/03/16 2333 11/04/16 0341 11/04/16 0750  LZJQBH 419* 379* 024* 097* 80*    Microbiology: Results for orders placed or performed during the hospital encounter of 09/29/2016  MRSA PCR Screening     Status: None   Collection Time: 10/23/16  1:58 AM  Result Value Ref Range Status   MRSA by PCR NEGATIVE NEGATIVE Final    Comment:        The GeneXpert MRSA Assay (FDA approved for NASAL specimens only), is one component of a comprehensive MRSA colonization surveillance program. It is not intended to diagnose MRSA infection nor to guide or monitor treatment for MRSA infections.   Culture, Urine     Status: Abnormal   Collection Time: 10/23/16 11:12 AM  Result Value Ref Range Status   Specimen Description URINE, RANDOM  Final   Special Requests NONE  Final   Culture MULTIPLE SPECIES PRESENT, SUGGEST RECOLLECTION (A)  Final    Report Status 10/24/2016 FINAL  Final  Culture, blood (Routine X 2) w Reflex to ID Panel     Status: None   Collection Time: 10/25/16 12:04 PM  Result Value Ref Range Status   Specimen Description BLOOD LEFT HAND  Final   Special Requests IN PEDIATRIC BOTTLE Blood Culture adequate volume  Final   Culture NO GROWTH 5 DAYS  Final   Report Status 10/30/2016 FINAL  Final  Culture, blood (Routine X 2) w Reflex to ID Panel     Status: None   Collection Time: 10/25/16 12:04 PM  Result Value Ref Range Status   Specimen Description BLOOD LEFT HAND  Final   Special Requests IN PEDIATRIC BOTTLE Blood Culture adequate volume  Final   Culture NO GROWTH 5 DAYS  Final   Report Status 10/30/2016 FINAL  Final  Fungus Culture With Stain     Status: None (Preliminary result)   Collection Time: 11/13/2016  3:18 PM  Result Value Ref Range Status   Fungus Stain Final report  Final    Comment: (NOTE) Performed At: [facility] [place], [place] [id number] Lindon Romp MD [id number]    Fungus (Mycology) Culture PENDING  Incomplete   Fungal Source TISSUE  Final  Acid Fast Smear (AFB)     Status: None   Collection Time: 11/19/2016  3:18 PM  Result Value Ref Range Status   AFB Specimen Processing Comment  Final    Comment: Tissue Grinding and Digestion/Decontamination   Acid Fast Smear Negative  Final    Comment: (NOTE) Performed At: [facility] [place], [place] [id number] Lindon Romp MD [id number]    Source (AFB) TISSUE  Final  Fungus Culture Result     Status: None   Collection Time: 11/03/2016  3:18 PM  Result Value Ref Range Status   Result 1 Comment  Final    Comment: (NOTE) KOH/Calcofluor preparation:  no fungus observed. Performed At: [facility] [address] [place], [place] [id number] Lindon Romp MD [id number]   CSF culture with Stat gram stain     Status: None (Preliminary result)   Collection Time: 11/02/16  12:13 PM  Result Value Ref Range Status   Specimen Description CSF  Final   Special Requests NONE  Final   Gram Stain   Final    CYTOSPIN SMEAR WBC PRESENT, PREDOMINANTLY MONONUCLEAR NO ORGANISMS SEEN    Culture NO GROWTH < 24 HOURS  Final   Report Status PENDING  Incomplete    Coagulation Studies: No results for input(s): LABPROT, INR in the  last 72 hours.  Urinalysis: No results for input(s): COLORURINE, LABSPEC, PHURINE, GLUCOSEU, HGBUR, BILIRUBINUR, KETONESUR, PROTEINUR, UROBILINOGEN, NITRITE, LEUKOCYTESUR in the last 72 hours.  Invalid input(s): APPERANCEUR    Imaging: Dg Chest Port 1 View  Result Date: 11/02/2016 CLINICAL DATA:  Followup ventilator support. EXAM: PORTABLE CHEST 1 VIEW COMPARISON:  11/01/2016 FINDINGS: Endotracheal tube tip is 2 cm above the carina. Soft feeding tube enters the abdomen. Patchy infiltrate in both lower lobes left more than right is improved. No worsening or new findings. IMPRESSION: Improved lower lobe pneumonia. Electronically Signed   By: Nelson Chimes M.D.   On: 11/02/2016 11:17     Medications:   . dextrose 75 mL/hr at 11/04/16 0700  . feeding supplement (VITAL AF 1.2 CAL) Stopped (11/04/16 0532)  . lacosamide (VIMPAT) IV Stopped (11/03/16 2257)  . levETIRAcetam Stopped (11/04/16 0423)  . levETIRAcetam Stopped (11/03/16 2216)  . niCARDipine 10 mg/hr (11/04/16 0700)  . propofol (DIPRIVAN) infusion 10 mcg/kg/min (11/04/16 0827)   . bethanechol  10 mg Oral TID  . chlorhexidine gluconate (MEDLINE KIT)  15 mL Mouth Rinse BID  . Chlorhexidine Gluconate Cloth  6 each Topical Q0600  . cholecalciferol  2,000 Units Oral Daily  . dexamethasone  4 mg Intravenous Q6H  . heparin subcutaneous  5,000 Units Subcutaneous Q12H  . hydrALAZINE  25 mg Oral Q8H  . insulin aspart  0-20 Units Subcutaneous Q4H  . insulin aspart  6 Units Subcutaneous Q4H  . insulin glargine  15 Units Subcutaneous Daily  . mouth rinse  15 mL Mouth Rinse 10 times per day   . metoprolol tartrate  50 mg Per Tube BID  . mycophenolate  250 mg Oral BID  . pantoprazole sodium  40 mg Per Tube Q24H  . pravastatin  20 mg Per Tube q1800  . sodium chloride flush  10-40 mL Intracatheter Q12H  . sodium chloride flush  3 mL Intravenous Q12H  . tacrolimus  3 mg Oral BID   Or  . tacrolimus  1.5 mg Sublingual BID   acetaminophen **OR** acetaminophen, albuterol, bisacodyl, camphor-menthol, hydrALAZINE, HYDROmorphone (DILAUDID) injection, labetalol, levalbuterol, naLOXone (NARCAN)  injection, [DISCONTINUED] ondansetron **OR** ondansetron (ZOFRAN) IV, promethazine, senna-docusate, sodium chloride flush  Assessment/ Plan:  1. Frontal lobe lesion/left sided weakness/lethargy - s/p craniotomy. Path results Glioma 2. Vent - per CCM 3. Renal xpl #2 - BL creatinine 1.5-1.7. AKI on CKD (creatinine 2.29 on admission) on setting of hospitalization for possibly stroke/seizure/possible brain tumor with decreased mental status. Creatinine at baseline. Getting IV steroids (currently decadron for brain issues so pred on hold) and IV mycophenolate, SL prograf at 1/2 dose. TAC 4.7 on 9/30 (did not come back until 10/2) Reduced prograf dosing to 3 mg BID 10/2, and 1.5 mg BID 10/4 (SL); creatinine is stable 1. Repeated TAC level  4.7   4. Metabolic acidosis - resolved 5. Hypokalemia - replete prn 6. Hypernatremia - Gettingfree water via OGT. Slow improvement still 148Discussed with critical care they are happy with mild hypernatremia  7. Systolic and diastolic HF 8. HTN - currently IV metoprolol 9. Assymetric RUE edema - [place] UE today brachial vein DVT (no extension into axillary v) -CCM aware. I would presume heparin contraindicated in light of recent craniotomy 10. Post op Hb variation - Hb 12->9.6 >8.9.>8 Trend 11. Anasarca  There appears to be general third spacing and despite intravascular volume contraction- I suspect that this is related to low albumin and poor overall nutritional  state. Not sure that there  is much that can be offered at this point and prognosis may be extremely poor.    LOS: 62 Roger Gibson W @TODAY @9 :49 AM

## 2016-11-04 NOTE — Progress Notes (Signed)
Inpatient Diabetes Program Recommendations  AACE/ADA: New Consensus Statement on Inpatient Glycemic Control (2015)  Target Ranges:  Prepandial:   less than 140 mg/dL      Peak postprandial:   less than 180 mg/dL (1-2 hours)      Critically ill patients:  140 - 180 mg/dL   Lab Results  Component Value Date   GLUCAP 233 (H) 11/04/2016   HGBA1C 5.2 03/22/2005    Review of Glycemic Control Results for Roger Gibson, Roger Gibson (MRN [id number]) as of 11/04/2016 10:20  Ref. Range 11/03/2016 15:46 11/03/2016 20:09 11/03/2016 23:33 11/04/2016 03:41 11/04/2016 07:50  Glucose-Capillary Latest Ref Range: 65 - 99 mg/dL 281 (H) 324 (H) 353 (H) 308 (H) 233 (H)   Inpatient Diabetes Program Recommendations:  CBGs 281-353. Please consider ICU Glycemic Phase 2 control orders or increase in tubefeed coverage.  Thank you, Nani Gasser. Serenna Deroy, RN, MSN, CDE  Diabetes Coordinator Inpatient Glycemic Control Team Team Pager [phone] (8am-5pm) 11/04/2016 10:22 AM

## 2016-11-05 ENCOUNTER — Inpatient Hospital Stay (HOSPITAL_COMMUNITY): Payer: Medicare HMO

## 2016-11-05 LAB — BASIC METABOLIC PANEL
Anion gap: 6 (ref 5–15)
BUN: 33 mg/dL — AB (ref 6–20)
CALCIUM: 9.5 mg/dL (ref 8.9–10.3)
CO2: 22 mmol/L (ref 22–32)
CREATININE: 0.8 mg/dL (ref 0.61–1.24)
Chloride: 118 mmol/L — ABNORMAL HIGH (ref 101–111)
GFR calc non Af Amer: >60 mL/min (ref >60)
Glucose, Bld: 301 mg/dL — ABNORMAL HIGH (ref 65–99)
Potassium: 3.8 mmol/L (ref 3.5–5.1)
SODIUM: 146 mmol/L — AB (ref 135–145)

## 2016-11-05 LAB — CSF CULTURE

## 2016-11-05 LAB — GLUCOSE, CAPILLARY
GLUCOSE-CAPILLARY: 276 mg/dL — AB (ref 65–99)
GLUCOSE-CAPILLARY: 298 mg/dL — AB (ref 65–99)
Glucose-Capillary: 335 mg/dL — ABNORMAL HIGH (ref 65–99)
Glucose-Capillary: 245 mg/dL — ABNORMAL HIGH (ref 65–99)
Glucose-Capillary: 190 mg/dL — ABNORMAL HIGH (ref 65–99)
Glucose-Capillary: 177 mg/dL — ABNORMAL HIGH (ref 65–99)

## 2016-11-05 LAB — CSF CULTURE W GRAM STAIN: Culture: NO GROWTH

## 2016-11-05 MED ORDER — ETOMIDATE 2 MG/ML IV SOLN
INTRAVENOUS | Status: AC
  Filled 2016-11-05: qty 10

## 2016-11-05 MED ORDER — DEXAMETHASONE SODIUM PHOSPHATE 4 MG/ML IJ SOLN
4.0000 mg | Freq: Two times a day (BID) | INTRAMUSCULAR | Status: DC
  Administered 2016-11-05 – 2016-11-09 (×9): 4 mg via INTRAVENOUS
  Filled 2016-11-05 (×9): qty 1

## 2016-11-05 MED ORDER — HYDRALAZINE HCL 50 MG PO TABS
100.0000 mg | ORAL_TABLET | Freq: Three times a day (TID) | ORAL | Status: DC
  Administered 2016-11-05 – 2016-11-12 (×21): 100 mg via ORAL
  Filled 2016-11-05 (×21): qty 2

## 2016-11-05 MED ORDER — FUROSEMIDE 10 MG/ML IJ SOLN
40.0000 mg | Freq: Once | INTRAMUSCULAR | Status: AC
  Administered 2016-11-05: 40 mg via INTRAVENOUS
  Filled 2016-11-05: qty 4

## 2016-11-05 MED ORDER — INSULIN GLARGINE 100 UNIT/ML ~~LOC~~ SOLN
25.0000 [IU] | Freq: Every day | SUBCUTANEOUS | Status: DC
  Administered 2016-11-05 – 2016-11-12 (×8): 25 [IU] via SUBCUTANEOUS
  Filled 2016-11-05 (×8): qty 0.25

## 2016-11-05 NOTE — Progress Notes (Signed)
PULMONARY / CRITICAL CARE MEDICINE   Name: Roger Gibson MRN: [id number] DOB: [birth year]    ADMISSION DATE:  10/06/2016 CONSULTATION DATE:  11/08/2016  REFERRING MD: Dr. Wynelle Cleveland, [facility]  CHIEF COMPLAINT:  Lt arm weakness  HISTORY OF PRESENT ILLNESS:   70 yo male with Lt arm weakness and found to have frontal lobe mass.  Had craniotomy 10/05 and remained on vent post op.  PMHx of ESRD, HTN, combined CHF, Rt renal cell cancer s/p transplant.  SUBJECTIVE:  Remains on ventilator, no significant change in neuro status  Evaluated by neuro-oncology, no changes to immediate management  Started lactulose with 2 BMs in response   VITAL SIGNS: BP (!) 149/64   Pulse 69   Temp 98.7 F (37.1 C) (Axillary)   Resp (!) 21   Ht 5\' 10"  (1.778 m)   Wt 214 lb 4.6 oz (97.2 kg)   SpO2 100%   BMI 30.75 kg/m   VENTILATOR SETTINGS: Vent Mode: PRVC FiO2 (%):  [30 %] 30 % Set Rate:  [18 bmp] 18 bmp Vt Set:  [580 mL-610 mL] 580 mL PEEP:  [5 cmH20] 5 cmH20 Pressure Support:  [10 cmH20] 10 cmH20 Plateau Pressure:  [16 cmH20-20 cmH20] 16 cmH20  INTAKE / OUTPUT: I/O last 3 completed shifts: In: 6297.3 [I.V.:4457.6; NG/GT:1172.2; IV Piggyback:667.5] Out: 2094 [Urine:4025; Stool:650]  PHYSICAL EXAMINATION:  General: Remains on vent, sedated Neuro: Improved, fc, minimal to no withdrawal to pain   HEENT: ETT secured in place, healing cranial surgical wound PULM: Coarse anteriorly  CV:  RRR, s1, s2  GI: soft, +BS Extremities: Bilateral UE edema, LE edema     LABS:  BMET  Recent Labs Lab 11/04/16 1012 11/04/16 1715 11/05/16 0317  NA 149* 149* 146*  K 4.2 4.0 3.8  CL 119* 121* 118*  CO2 22 24 22   BUN 36* 35* 33*  CREATININE 0.88 0.84 0.80  GLUCOSE 285* 289* 301*    Electrolytes  Recent Labs Lab 11/01/16 0340 11/01/16 1456  11/03/16 0500  11/04/16 0404 11/04/16 1012 11/04/16 1715 11/05/16 0317  CALCIUM 8.4*  --   < > 9.6  < > 9.5 9.7 9.5 9.5  MG 1.9 1.8  --  2.2  --  1.9   --   --   --   PHOS 2.8 2.3*  --   --   --  1.7*  --   --   --   < > = values in this interval not displayed.  CBC  Recent Labs Lab 10/31/16 0232 11/01/16 0340 11/04/16 0404  WBC 4.6 4.7 6.7  HGB 9.5* 8.9* 8.0*  HCT 30.4* 29.4* 25.5*  PLT 104* 117* 259    Coag's No results for input(s): APTT, INR in the last 168 hours.  Sepsis Markers No results for input(s): LATICACIDVEN, PROCALCITON, O2SATVEN in the last 168 hours.  ABG  Recent Labs Lab 11/21/2016 1928  PHART 7.333*  PCO2ART 44.0  PO2ART 191*    Liver Enzymes  Recent Labs Lab 10/30/16 0415 10/31/16 0232 11/04/16 1012  AST  --   --  68*  ALT  --   --  43  ALKPHOS  --   --  62  BILITOT  --   --  0.4  ALBUMIN 2.1* 2.1* 1.9*    Cardiac Enzymes No results for input(s): TROPONINI, PROBNP in the last 168 hours.  Glucose  Recent Labs Lab 11/04/16 0750 11/04/16 1232 11/04/16 1641 11/04/16 2001 11/04/16 2340 11/05/16 0327  GLUCAP 233* 281* 290*  224* 208* 276*    Imaging No results found.   STUDIES:  EEG 9/27 >> lateralized periodic discharge Rt posterior quadrant with delta morphology EEG 10/9 >> diffuse encephalopathy, more severe cortical dysfunction of R hemisphere, no seizures   Echo 9/30 >> EF 55 to 60%, mod LVH, grade 1 DD, mild MR  MRI 10/8 >> Post-op changes surrounding biopsy site with residual mass and edema   CULTURES: Blood 10/01 >> negative Brain Bx 10/05 >>Grade III Glioma   ANTIBIOTICS: None  SIGNIFICANT EVENTS: 09/27 Admit 10/05 Craniotomy with biopsy collected 10/8- not waking up, neuro re called  LINES/TUBES: ETT 10/05 >>  Lt femoral CVL 10/05 >>  ASSESSMENT / PLAN:  NEURO: Acute encephalopathy with Lt arm weakness. Frontal lobe mass s/p craniotomy. Bx with Grade III Glioma  - wound care for surgical site  - AEDs per neurology - Cont Decadron, currently 4 mg IV q6hr  - F/u pending JC Virus PCR though biopsy proven glioma - Neuro-Onc consulted 10/11> case to be  discussed by inter-disciplinary team 10/15. Will follow his status   RESP Compromised airway, Acute resp failure -still with slightly positive fluid balance   -PRVC, continue to wean as able  - RASS goal -, propofol to achieve   ESRD. S/p renal transplant. Hypernatremia. -On D5W at 100 ml/hr for correction, Na improving, now 146  -Monitor BMP  GI: GI bleed 10/03. Hyperammonemia - Protonix - Continue TF per protocol - Lactulose 30 g TID  - Lantus, Novolog TF coverage, SSI, may need further increase   CV: Hypertension, HLD. - Cardene gtt, attempt to titrate down and DC  to  - Continue oral hydral, metoprolol  - goal SBP < 160 - IV team unable to place peripheral IV, eval for other access options including IJ and remove femoral CVC when able   HEME: Anemia of critical illness, chronic disease, and GI bleed. Thrombocytopenia. DVT rt brachial vein, no LE DVT - scd, subQ heparin - No full dose heparin until 2 wks out from bx (10/19)   SUP - Protonix Nutrition - tube feeds, resistant SSI with additional coverage for steroids, D5W Goals of care - full code  Family: Wife updated when present at bedside     STAFF NOTE: I, Merrie Roof, MD FACP have personally reviewed patient's available data, including medical history, events of note, physical examination and test results as part of my evaluation. I have discussed with resident/NP and other care providers such as pharmacist, RN and RRT. In addition, I personally evaluated patient and elicited key findings of: more awake, following simple commands for first time, coughs well, BS coarse, fem line remains, edema uppers remains, had pos balance for NA correction, had 2 BM, lactulose remains, had oncology assessment, assess pcxr for fluid status, consider weaning ps 10 now, goal to 5 if able, still have concerns about extubation success if he remains so weak, great that he is following simple commands, maintain lactulose and  increase to tid, was unimpressed with ammonia levels overall, Na is down and neuro is better, correct to goal 145, d5w to 50 and likley dc in am , LF tare wnl, worry he may require trach, would give him to early next week and hope for a window, if not would trach on wed next week, lasix d/w renal, wil add to even to slight neg balance, will place a smaller IJ catheter for access in order to dc fem line, unable to get PIV and risk is concerning of  fem access this long, feeding well, again heparin for DVT on  19th, maintain sub q hep, lowers doppler prelim neg, I will update wife when arrives, I think we have gained benefit and max benefit from steroids, reduce, need to control glucose better, increase lantus The patient is critically ill with multiple organ systems failure and requires high complexity decision making for assessment and support, frequent evaluation and titration of therapies, application of advanced monitoring technologies and extensive interpretation of multiple databases.   Critical Care Time devoted to patient care services described in this note is35 Minutes. This time reflects time of care of this signee: Merrie Roof, MD FACP. This critical care time does not reflect procedure time, or teaching time or supervisory time of PA/NP/Med student/Med Resident etc but could involve care discussion time. Rest per NP/medical resident whose note is outlined above and that I agree with   Lavon Paganini. Titus Mould, MD, [place] Pgr: [facility] 11/05/2016 8:26 AM

## 2016-11-05 NOTE — Progress Notes (Signed)
RT called to pt room due to RR 35+. Patient weaned for 6 hours today on 10/5 30%. Patient is now back on full support, vitals are stable, patient is resting comfortably, RT will continue to monitor.

## 2016-11-05 NOTE — Procedures (Signed)
Central Venous Catheter Insertion Procedure Note AULTON ROUTT [id number] 10-11-1945  Procedure: Insertion of Central Venous Catheter Indications: Assessment of intravascular volume, Drug and/or fluid administration, Frequent blood sampling and need to dc fem line  Procedure Details Consent: Risks of procedure as well as the alternatives and risks of each were explained to the (patient/caregiver).  Consent for procedure obtained. Time Out: Verified patient identification, verified procedure, site/side was marked, verified correct patient position, special equipment/implants available, medications/allergies/relevent history reviewed, required imaging and test results available.  Performed  Maximum sterile technique was used including antiseptics, cap, gloves, gown, hand hygiene, mask and sheet. Skin prep: Chlorhexidine; local anesthetic administered A antimicrobial bonded/coated triple lumen catheter was placed in the right internal jugular vein using the Seldinger technique.  Evaluation Blood flow good Complications: wire broke when removing it fro line, we had some resistance at 15 cm so we did not advance the wire or line past this, then attempted to remove wire and it broke off, I removed line and wire and appears all came out, will get pcxr then re stuck and placed small white cath sheeth easily over needle, under [place] Patient did tolerate procedure well. Chest X-ray ordered to verify placement.  CXR: pending.  Raylene Miyamoto 11/05/2016, 2:52 PM  Lavon Paganini. Titus Mould, MD, FACP Pgr: [phone] [facility]   [place]

## 2016-11-05 NOTE — Progress Notes (Signed)
Orthopedic Tech Progress Note Patient Details:  Roger Gibson 11-Jun-1945 [id number]  Ortho Devices Ortho Device/Splint Location: Bilateral heel boots   [place] Pink 11/05/2016, 11:30 AM

## 2016-11-05 NOTE — Progress Notes (Signed)
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 Baseline creatinine 1.5 and 1.7. Pred/prograf/myfortic. 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 Presented to ED with left-sided weakness, admitted with sz- brain imaging possible/probable frontal lobe brain mass.Underwent a craniotomy and biopsy 10/5. Poor neurological function noted and CT head demonstrating post op changes. RUE brachial vein DVT noted for arm edema    Objective:  Vital signs in last 24 hours:  Temp:  [98.2 F (36.8 C)-99.3 F (37.4 C)] 99 F (37.2 C) (10/12 0800) Pulse Rate:  [62-90] 72 (10/12 0830) Resp:  [19-30] 30 (10/12 0830) BP: (119-174)/(62-104) 163/65 (10/12 0830) SpO2:  [99 %-100 %] 100 % (10/12 0830) FiO2 (%):  [30 %] 30 % (10/12 0758) Weight:  [214 lb 4.6 oz (97.2 kg)] 214 lb 4.6 oz (97.2 kg) (10/12 0428)  Weight change: 13 lb 3.6 oz (6 kg) Filed Weights   11/03/16 0900 11/04/16 0420 11/05/16 0428  Weight: 201 lb 1 oz (91.2 kg) 208 lb 8.9 oz (94.6 kg) 214 lb 4.6 oz (97.2 kg)    Intake/Output: I/O last 3 completed shifts: In: 6683.8 [I.V.:4844.1; NG/GT:1172.2; IV Piggyback:667.5] Out: 4008 [Urine:4025; Stool:650]   Intake/Output this shift:  Total I/O In: 1032.4 [I.V.:150.6; NG/GT:881.8] Out: -   CVS- RRR RS- CTA ABD- BS present soft non-distended EXT- edema noted RUE and LUE with very little edema in lower extremities _ DVTs noted on ultrasound and patient will not be able to be anticoagulated until later this month due to recent neurosurgery   Basic Metabolic Panel:  Recent Labs Lab 10/31/16 0232 10/31/16 1419 11/01/16 0340 11/01/16 1456  11/03/16 0500 11/03/16 1822 11/04/16 0404  11/04/16 1012 11/04/16 1715 11/05/16 0317  NA 148*  --  148*  --   < > 150* 148* 149* 149* 149* 146*  K 3.6  --  3.7  --   < > 4.3 4.2 4.1 4.2 4.0 3.8  CL 118*  --  117*  --   < > 115* 121* 120* 119* 121* 118*  CO2 24  --  24  --   < > 25 23 25 22 24 22   GLUCOSE 135*  --  191*  --   < > 410* 362* 310* 285* 289* 301*  BUN 26*  --  29*  --   < > 36* 36* 35* 36* 35* 33*  CREATININE 1.33*  --  1.14  --   < > 1.29* 0.96 0.88 0.88 0.84 0.80  CALCIUM 8.4*  --  8.4*  --   < > 9.6 9.4 9.5 9.7 9.5 9.5  MG  --  1.7 1.9 1.8  --  2.2  --  1.9  --   --   --   PHOS 2.5 2.1* 2.8 2.3*  --   --   --  1.7*  --   --   --   < > = values in this interval not displayed.  Liver Function Tests:  Recent Labs Lab 10/30/16 0415 10/31/16 0232 11/04/16 1012  AST  --   --  68*  ALT  --   --  43  ALKPHOS  --   --  62  BILITOT  --   --  0.4  PROT  --   --  5.5*  ALBUMIN 2.1* 2.1* 1.9*   No results for input(s): LIPASE, AMYLASE in the last 168 hours.  Recent Labs Lab 11/01/16 1515 11/02/16 0430  AMMONIA 86* 132*    CBC:  Recent Labs Lab 10/30/16 0415 10/31/16 0232 11/01/16 0340 11/04/16 0404  WBC 3.0* 4.6 4.7 6.7  HGB 9.6* 9.5* 8.9* 8.0*  HCT 30.9* 30.4* 29.4* 25.5*  MCV 91.7 91.6 91.9 90.4  PLT 121* 104* 117* 259    Cardiac Enzymes: No results for input(s): CKTOTAL, CKMB, CKMBINDEX, TROPONINI in the last 168 hours.  BNP: Invalid input(s): POCBNP  CBG:  Recent Labs Lab 11/04/16 1641 11/04/16 2001 11/04/16 2340 11/05/16 0327 11/05/16 0818  GLUCAP 290* 12* 208* 72* 46*    Microbiology: Results for orders placed or performed during the hospital encounter of 09/27/2016  MRSA PCR Screening     Status: None   Collection Time: 10/23/16  1:58 AM  Result Value Ref Range Status   MRSA by PCR NEGATIVE NEGATIVE Final    Comment:        The GeneXpert MRSA Assay (FDA approved for NASAL specimens only), is one component of a comprehensive MRSA colonization surveillance program. It  is not intended to diagnose MRSA infection nor to guide or monitor treatment for MRSA infections.   Culture, Urine     Status: Abnormal   Collection Time: 10/23/16 11:12 AM  Result Value Ref Range Status   Specimen Description URINE, RANDOM  Final   Special Requests NONE  Final   Culture MULTIPLE SPECIES PRESENT, SUGGEST RECOLLECTION (A)  Final   Report Status 10/24/2016 FINAL  Final  Culture, blood (Routine X 2) w Reflex to ID Panel     Status: None   Collection Time: 10/25/16 12:04 PM  Result Value Ref Range Status   Specimen Description BLOOD LEFT HAND  Final   Special Requests IN PEDIATRIC BOTTLE Blood Culture adequate volume  Final   Culture NO GROWTH 5 DAYS  Final   Report Status 10/30/2016 FINAL  Final  Culture, blood (Routine X 2) w Reflex to ID Panel     Status: None   Collection Time: 10/25/16 12:04 PM  Result Value Ref Range Status   Specimen Description BLOOD LEFT HAND  Final   Special Requests IN PEDIATRIC BOTTLE Blood Culture adequate volume  Final   Culture NO GROWTH 5 DAYS  Final   Report Status 10/30/2016 FINAL  Final  Fungus Culture With Stain     Status: None (Preliminary result)   Collection Time: 11/20/2016  3:18 PM  Result Value Ref Range Status   Fungus Stain Final report  Final    Comment: (NOTE) Performed At: [facility] [place], [place] [id number] Lindon Romp MD [id number]    Fungus (Mycology) Culture PENDING  Incomplete   Fungal Source TISSUE  Final  Acid Fast Smear (AFB)     Status: None   Collection Time: 11/13/2016  3:18 PM  Result Value Ref Range Status   AFB Specimen Processing Comment  Final    Comment: Tissue Grinding and Digestion/Decontamination   Acid Fast Smear Negative  Final    Comment: (NOTE) Performed At: [facility] [place], [place] [id number] Lindon Romp MD SJ:[id number]    Source (AFB) TISSUE  Final  Fungus Culture Result     Status: None   Collection Time: 10/31/2016   3:18 PM  Result Value Ref Range Status   Result 1 Comment  Final  Comment: (NOTE) KOH/Calcofluor preparation:  no fungus observed. Performed At: [facility] [address] [place], [place] [id number] Lindon Romp MD IF:[id number]   CSF culture with Stat gram stain     Status: None (Preliminary result)   Collection Time: 11/02/16 12:13 PM  Result Value Ref Range Status   Specimen Description CSF  Final   Special Requests NONE  Final   Gram Stain   Final    CYTOSPIN SMEAR WBC PRESENT, PREDOMINANTLY MONONUCLEAR NO ORGANISMS SEEN    Culture NO GROWTH 3 DAYS  Final   Report Status PENDING  Incomplete    Coagulation Studies: No results for input(s): LABPROT, INR in the last 72 hours.  Urinalysis: No results for input(s): COLORURINE, LABSPEC, PHURINE, GLUCOSEU, HGBUR, BILIRUBINUR, KETONESUR, PROTEINUR, UROBILINOGEN, NITRITE, LEUKOCYTESUR in the last 72 hours.  Invalid input(s): APPERANCEUR    Imaging: No results found.   Medications:   . dextrose 50 mL/hr at 11/05/16 0830  . feeding supplement (VITAL AF 1.2 CAL) 1,000 mL (11/05/16 0800)  . lacosamide (VIMPAT) IV Stopped (11/04/16 2312)  . levETIRAcetam Stopped (11/05/16 0441)  . levETIRAcetam Stopped (11/04/16 2257)  . niCARDipine 11.5 mg/hr (11/05/16 0823)  . propofol (DIPRIVAN) infusion Stopped (11/05/16 0800)   . bethanechol  10 mg Oral TID  . chlorhexidine gluconate (MEDLINE KIT)  15 mL Mouth Rinse BID  . Chlorhexidine Gluconate Cloth  6 each Topical Q0600  . cholecalciferol  2,000 Units Oral Daily  . dexamethasone  4 mg Intravenous Q12H  . furosemide  40 mg Intravenous Once  . heparin subcutaneous  5,000 Units Subcutaneous Q12H  . hydrALAZINE  100 mg Oral Q8H  . insulin aspart  0-20 Units Subcutaneous Q4H  . insulin aspart  6 Units Subcutaneous Q4H  . insulin glargine  25 Units Subcutaneous Daily  . lactulose  30 g Oral TID  . mouth rinse  15 mL Mouth Rinse 10 times per day  . metoprolol  tartrate  50 mg Per Tube BID  . mycophenolate  250 mg Oral BID  . pantoprazole sodium  40 mg Per Tube Q24H  . pravastatin  20 mg Per Tube q1800  . sodium chloride flush  10-40 mL Intracatheter Q12H  . sodium chloride flush  3 mL Intravenous Q12H  . tacrolimus  3 mg Oral BID   Or  . tacrolimus  1.5 mg Sublingual BID   acetaminophen **OR** acetaminophen, albuterol, bisacodyl, camphor-menthol, hydrALAZINE, HYDROmorphone (DILAUDID) injection, labetalol, levalbuterol, naLOXone (NARCAN)  injection, [DISCONTINUED] ondansetron **OR** ondansetron (ZOFRAN) IV, promethazine, senna-docusate, sodium chloride flush  Assessment/ Plan:  1. Frontal lobe lesion/left sided weakness/lethargy - s/p craniotomy. Path results Glioma Appreciate assistance from Dr Mickeal Skinner 2. Vent - per CCM 3. Renal xpl #2 - BL creatinine 1.5-1.7. AKI on CKD (creatinine 2.29 on admission) on setting of hospitalization for possibly stroke/seizure/possible brain tumor with decreased mental status. Creatinine at baseline. Getting IV steroids (currently decadron for brain issues so pred on hold) and IV mycophenolate, SL prograf at 1/2 dose. TAC 4.7on 9/30 (did not come back until 10/2) Reduced prograf dosing to 3 mg BID 10/2, and 1.5 mg BID 10/4 (SL); creatinine is stable 1. RepeatedTAC level 4.7 4. Metabolic acidosis - resolved 5. Hypokalemia - replete prn 6. Hypernatremia - Gettingfree water via OGT. Slow improvement still 146Discussed with critical care they are happy with mild hypernatremia  7. Systolic and diastolic HF 8. HTN - currently IV metoprolol 9. Assymetric RUE edema - [place] UE today brachial vein DVT (no extension into  axillary v) -CCM aware. I would presume heparin contraindicated in light of recent craniotomy 10. Post op Hb variation - Hb 12->9.6 >8.9.>8 Trend 11. Anasarca  There appears to be general third spacing and despite intravascular volume contraction-He has extensive upper extremity clot that is leading to  the upper extremity edema        LOS: 15 Roldan Laforest W @TODAY @9 :43 AM

## 2016-11-06 LAB — GLUCOSE, CAPILLARY
GLUCOSE-CAPILLARY: 180 mg/dL — AB (ref 65–99)
GLUCOSE-CAPILLARY: 184 mg/dL — AB (ref 65–99)
Glucose-Capillary: 157 mg/dL — ABNORMAL HIGH (ref 65–99)
Glucose-Capillary: 159 mg/dL — ABNORMAL HIGH (ref 65–99)
Glucose-Capillary: 165 mg/dL — ABNORMAL HIGH (ref 65–99)
Glucose-Capillary: 167 mg/dL — ABNORMAL HIGH (ref 65–99)

## 2016-11-06 MED ORDER — FREE WATER
300.0000 mL | Freq: Four times a day (QID) | Status: DC
  Administered 2016-11-06 – 2016-11-08 (×8): 300 mL

## 2016-11-06 MED ORDER — LEVETIRACETAM 500 MG/5ML IV SOLN
750.0000 mg | Freq: Two times a day (BID) | INTRAVENOUS | Status: DC
  Administered 2016-11-06 – 2016-11-14 (×16): 750 mg via INTRAVENOUS
  Filled 2016-11-06 (×17): qty 7.5

## 2016-11-06 NOTE — Progress Notes (Signed)
PULMONARY / CRITICAL CARE MEDICINE   Name: LENOARD HELBERT MRN: [id number] DOB: Aug 21, 1945    ADMISSION DATE:  10/09/2016 CONSULTATION DATE:  11/01/2016  REFERRING MD: Dr. Wynelle Cleveland, [facility]  CHIEF COMPLAINT:  Lt arm weakness  HISTORY OF PRESENT ILLNESS:   71 yo male with Lt arm weakness and found to have frontal lobe mass.  Had craniotomy 10/05 and remained on vent post op.  PMHx of ESRD, HTN, combined CHF, Rt renal cell cancer s/p transplant.  SUBJECTIVE:  Having bowel movements with lactulose Small right IJ versus EJ IV placed 10/12, appears to have leaking versus weeping. It will not draw back, flushes with difficulty  VITAL SIGNS: BP (!) 158/66   Pulse 71   Temp 98.7 F (37.1 C) (Axillary)   Resp (!) 24   Ht 5\' 10"  (1.778 m)   Wt 96.2 kg (212 lb 1.3 oz)   SpO2 100%   BMI 30.43 kg/m   VENTILATOR SETTINGS: Vent Mode: PRVC FiO2 (%):  [30 %] 30 % Set Rate:  [18 bmp] 18 bmp Vt Set:  [580 mL] 580 mL PEEP:  [5 cmH20] 5 cmH20 Pressure Support:  [10 cmH20] 10 cmH20 Plateau Pressure:  [15 cmH20-19 cmH20] 19 cmH20  INTAKE / OUTPUT: I/O last 3 completed shifts: In: 6992 [I.V.:4375.2; NG/GT:2311.8; IV Piggyback:305] Out: 6300 [Urine:5100; Stool:1200]  PHYSICAL EXAMINATION:  General: Ill-appearing man, mechanically ventilated Neuro: Opens eyes to voice, has intermittently followed some commands, will not move his upper extremities HEENT: ET tube in position, scalp staples clean and dry PULM: Coarse bilateral breath sounds, no wheezing CV:  Regular, no murmur  GI: Soft, nontender, positive bowel sounds Extremities: Bilateral upper and lower extremity edema   LABS:  BMET  Recent Labs Lab 11/04/16 1012 11/04/16 1715 11/05/16 0317  NA 149* 149* 146*  K 4.2 4.0 3.8  CL 119* 121* 118*  CO2 22 24 22   BUN 36* 35* 33*  CREATININE 0.88 0.84 0.80  GLUCOSE 285* 289* 301*    Electrolytes  Recent Labs Lab 11/01/16 0340 11/01/16 1456  11/03/16 0500  11/04/16 0404  11/04/16 1012 11/04/16 1715 11/05/16 0317  CALCIUM 8.4*  --   < > 9.6  < > 9.5 9.7 9.5 9.5  MG 1.9 1.8  --  2.2  --  1.9  --   --   --   PHOS 2.8 2.3*  --   --   --  1.7*  --   --   --   < > = values in this interval not displayed.  CBC  Recent Labs Lab 10/31/16 0232 11/01/16 0340 11/04/16 0404  WBC 4.6 4.7 6.7  HGB 9.5* 8.9* 8.0*  HCT 30.4* 29.4* 25.5*  PLT 104* 117* 259    Coag's No results for input(s): APTT, INR in the last 168 hours.  Sepsis Markers No results for input(s): LATICACIDVEN, PROCALCITON, O2SATVEN in the last 168 hours.  ABG No results for input(s): PHART, PCO2ART, PO2ART in the last 168 hours.  Liver Enzymes  Recent Labs Lab 10/31/16 0232 11/04/16 1012  AST  --  68*  ALT  --  43  ALKPHOS  --  62  BILITOT  --  0.4  ALBUMIN 2.1* 1.9*    Cardiac Enzymes No results for input(s): TROPONINI, PROBNP in the last 168 hours.  Glucose  Recent Labs Lab 11/05/16 1127 11/05/16 1610 11/05/16 2021 11/05/16 2321 11/06/16 0432 11/06/16 0734  GLUCAP 335* 245* 190* 177* 167* 159*    Imaging Dg Chest [facility]  1 View  Result Date: 11/05/2016 CLINICAL DATA:  Encounter for central line EXAM: PORTABLE CHEST 1 VIEW COMPARISON:  Earlier today FINDINGS: Right neck catheter with tip at or above the thoracic inlet, question EJ placement. No visible pneumothorax. No mediastinal widening. Endotracheal tube tip between the clavicular heads and carina. Feeding tube at least reaches the stomach. There is a left brachiocephalic vascular stent. Mild cardiomegaly. Streaky perihilar opacities that are improved. There is pleural calcification medially in the right chest. Case discussed with ICU nurse. He said he will show it to Dr Titus Mould to be sure catheter is in expected position. IMPRESSION: 1. Right neck catheter with tip at or above the thoracic inlet, question EJ placement. No visible pneumothorax. No mediastinal widening. 2. Improved lung volumes and decreased  atelectasis compared earlier today. Electronically Signed   By: Monte Fantasia M.D.   On: 11/05/2016 15:20     STUDIES:  EEG 9/27 >> lateralized periodic discharge Rt posterior quadrant with delta morphology EEG 10/9 >> diffuse encephalopathy, more severe cortical dysfunction of R hemisphere, no seizures   Echo 9/30 >> EF 55 to 60%, mod LVH, grade 1 DD, mild MR  MRI 10/8 >> Post-op changes surrounding biopsy site with residual mass and edema   CULTURES: Blood 10/01 >> negative Brain Bx 10/05 >>Grade III Glioma   ANTIBIOTICS: None  SIGNIFICANT EVENTS: 09/27 Admit 10/05 Craniotomy with biopsy collected 10/8- not waking up, neuro re called  LINES/TUBES: ETT 10/05 >>  Lt femoral CVL 10/05 >> Right IJ single-lumen IV 10/12 >>   ASSESSMENT / PLAN:  NEURO: Acute encephalopathy with Lt arm weakness. Frontal lobe mass s/p craniotomy. Bx with Grade III Glioma  Continue current antiepileptic regimen Dexamethasone as ordered Follow culture data from lumbar puncture Appreciate oncology assistance, note molecular studies ordered and pending  RESP Compromised airway, Acute resp failure Altered mental status the large contributor, consider also the contribution of volume overload Continue PRBC, work towards spontaneous breathing trials as he can tolerate. Unclear that his neurological status and airway protection will allow extubation. Suspect that he will require tracheostomy, reassess over the next several days  ESRD. S/p renal transplant. Hypernatremia. Change D5W to free water flushes Continue immmunosuppression with tacrolimus and mycophenolate Follow BMP, urine output Avoid nephrotoxins  GI: GI bleed 10/03. Hyperammonemia 132 on 10/9 Continue pantoprazole Continue tube feeding per protocol Continue lactulose 3 times a day for now, recheck ammonia on 10/14 and plan to decrease if trending down  ENDO Hyperglycemia Continue Lantus and sliding scale insulin per  protocol  CV: Hypertension, HLD. Nicardipine drip running Enteral hydralazine, metoprolol Labetalol as needed, goal SBP less than 160 Suspect that right IJ single-lumen IV is losing functionality, leaking and will not flush adequately. I will leave the femoral CVC in place for now as he has few other options for IV access  HEME: Anemia of critical illness, chronic disease, and GI bleed. Thrombocytopenia. DVT rt brachial vein, no LE DVT Subcutaneous heparin acceptable, no full dose heparin until at least 10/19 post glioma resection  SUP - Protonix Nutrition -  Continue tube feeding  Goals of care - full code  Family: No family at bedside on 10/13  Independent CC time 88 minutes  Baltazar Apo, MD, PhD 11/06/2016, 10:44 AM [facility] [phone] or if no answer [phone]

## 2016-11-06 NOTE — Progress Notes (Signed)
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 Baseline creatinine 1.5 and 1.7. Pred/prograf/myfortic. Other PMH hypertension, chronic combined systolic/diastolic CHF (67-59%), history of right kidney cancer. Presented to ED with left-sided weakness, admitted with sz- brain imaging possible/probable frontal lobe brain mass.Underwent a craniotomy and biopsy 10/5. Poor neurological function noted and CT head demonstrating post op changes. RUE brachial vein DVT noted for arm edema     Objective:  Vital signs in last 24 hours:  Temp:  [98.5 F (36.9 C)-99.2 F (37.3 C)] 98.7 F (37.1 C) (10/13 0800) Pulse Rate:  [65-84] 71 (10/13 0845) Resp:  [17-33] 24 (10/13 0845) BP: (148-184)/(60-87) 158/66 (10/13 0845) SpO2:  [99 %-100 %] 100 % (10/13 0845) FiO2 (%):  [30 %] 30 % (10/13 0845) Weight:  [212 lb 1.3 oz (96.2 kg)] 212 lb 1.3 oz (96.2 kg) (10/13 0400)  Weight change: -2 lb 3.3 oz (-1 kg) Filed Weights   11/04/16 0420 11/05/16 0428 11/06/16 0400  Weight: 208 lb 8.9 oz (94.6 kg) 214 lb 4.6 oz (97.2 kg) 212 lb 1.3 oz (96.2 kg)    Intake/Output: I/O last 3 completed shifts: In: 6992 [I.V.:4375.2; NG/GT:2311.8; IV Piggyback:305] Out: 6300 [Urine:5100; Stool:1200]   Intake/Output this shift:  Total I/O In: 393.3 [I.V.:263.3; NG/GT:130] Out: 600 [Urine:600]  CVS- RRR RS- CTA ABD- BS present soft non-distended EXT- edema noted RUE and LUE with very little edema in lower extremities _ DVTs noted on ultrasound and patient will not be able to be anticoagulated until later this month due to recent neurosurgery   Basic Metabolic Panel:  Recent Labs Lab 10/31/16 0232 10/31/16 1419 11/01/16 0340 11/01/16 1456  11/03/16 0500 11/03/16 1822  11/04/16 0404 11/04/16 1012 11/04/16 1715 11/05/16 0317  NA 148*  --  148*  --   < > 150* 148* 149* 149* 149* 146*  K 3.6  --  3.7  --   < > 4.3 4.2 4.1 4.2 4.0 3.8  CL 118*  --  117*  --   < > 115* 121* 120* 119* 121* 118*  CO2 24  --  24  --   < > 25 23 25 22 24 22   GLUCOSE 135*  --  191*  --   < > 410* 362* 310* 285* 289* 301*  BUN 26*  --  29*  --   < > 36* 36* 35* 36* 35* 33*  CREATININE 1.33*  --  1.14  --   < > 1.29* 0.96 0.88 0.88 0.84 0.80  CALCIUM 8.4*  --  8.4*  --   < > 9.6 9.4 9.5 9.7 9.5 9.5  MG  --  1.7 1.9 1.8  --  2.2  --  1.9  --   --   --   PHOS 2.5 2.1* 2.8 2.3*  --   --   --  1.7*  --   --   --   < > = values in this interval not displayed.  Liver Function Tests:  Recent Labs Lab 10/31/16 0232 11/04/16 1012  AST  --  68*  ALT  --  43  ALKPHOS  --  62  BILITOT  --  0.4  PROT  --  5.5*  ALBUMIN 2.1* 1.9*   No results for input(s): LIPASE, AMYLASE in the last  168 hours.  Recent Labs Lab 11/01/16 1515 11/02/16 0430  AMMONIA 86* 132*    CBC:  Recent Labs Lab 10/31/16 0232 11/01/16 0340 11/04/16 0404  WBC 4.6 4.7 6.7  HGB 9.5* 8.9* 8.0*  HCT 30.4* 29.4* 25.5*  MCV 91.6 91.9 90.4  PLT 104* 117* 259    Cardiac Enzymes: No results for input(s): CKTOTAL, CKMB, CKMBINDEX, TROPONINI in the last 168 hours.  BNP: Invalid input(s): POCBNP  CBG:  Recent Labs Lab 11/05/16 1610 11/05/16 2021 11/05/16 2321 11/06/16 0432 11/06/16 0734  GLUCAP 245* 190* 177* 167* 159*    Microbiology: Results for orders placed or performed during the hospital encounter of 10/18/2016  MRSA PCR Screening     Status: None   Collection Time: 10/23/16  1:58 AM  Result Value Ref Range Status   MRSA by PCR NEGATIVE NEGATIVE Final    Comment:        The GeneXpert MRSA Assay (FDA approved for NASAL specimens only), is one component of a comprehensive MRSA colonization surveillance program. It is not intended to diagnose MRSA infection nor to guide or monitor  treatment for MRSA infections.   Culture, Urine     Status: Abnormal   Collection Time: 10/23/16 11:12 AM  Result Value Ref Range Status   Specimen Description URINE, RANDOM  Final   Special Requests NONE  Final   Culture MULTIPLE SPECIES PRESENT, SUGGEST RECOLLECTION (A)  Final   Report Status 10/24/2016 FINAL  Final  Culture, blood (Routine X 2) w Reflex to ID Panel     Status: None   Collection Time: 10/25/16 12:04 PM  Result Value Ref Range Status   Specimen Description BLOOD LEFT HAND  Final   Special Requests IN PEDIATRIC BOTTLE Blood Culture adequate volume  Final   Culture NO GROWTH 5 DAYS  Final   Report Status 10/30/2016 FINAL  Final  Culture, blood (Routine X 2) w Reflex to ID Panel     Status: None   Collection Time: 10/25/16 12:04 PM  Result Value Ref Range Status   Specimen Description BLOOD LEFT HAND  Final   Special Requests IN PEDIATRIC BOTTLE Blood Culture adequate volume  Final   Culture NO GROWTH 5 DAYS  Final   Report Status 10/30/2016 FINAL  Final  Fungus Culture With Stain     Status: None (Preliminary result)   Collection Time: 11/18/2016  3:18 PM  Result Value Ref Range Status   Fungus Stain Final report  Final    Comment: (NOTE) Performed At: [facility] [place], [place] [id number] Lindon Romp MD [id number]    Fungus (Mycology) Culture PENDING  Incomplete   Fungal Source TISSUE  Final  Acid Fast Smear (AFB)     Status: None   Collection Time: 11/12/2016  3:18 PM  Result Value Ref Range Status   AFB Specimen Processing Comment  Final    Comment: Tissue Grinding and Digestion/Decontamination   Acid Fast Smear Negative  Final    Comment: (NOTE) Performed At: [facility] [place], [place] [id number] Lindon Romp MD [id number]    Source (AFB) TISSUE  Final  Fungus Culture Result     Status: None   Collection Time: 11/20/2016  3:18 PM  Result Value Ref Range Status   Result 1 Comment  Final     Comment: (NOTE) KOH/Calcofluor preparation:  no fungus observed. Performed At: [facility] [place], [place] [id number] Lindon Romp MD [id number]  CSF culture with Stat gram stain     Status: None   Collection Time: 11/02/16 12:13 PM  Result Value Ref Range Status   Specimen Description CSF  Final   Special Requests NONE  Final   Gram Stain   Final    CYTOSPIN SMEAR WBC PRESENT, PREDOMINANTLY MONONUCLEAR NO ORGANISMS SEEN    Culture NO GROWTH 3 DAYS  Final   Report Status 11/05/2016 FINAL  Final    Coagulation Studies: No results for input(s): LABPROT, INR in the last 72 hours.  Urinalysis: No results for input(s): COLORURINE, LABSPEC, PHURINE, GLUCOSEU, HGBUR, BILIRUBINUR, KETONESUR, PROTEINUR, UROBILINOGEN, NITRITE, LEUKOCYTESUR in the last 72 hours.  Invalid input(s): APPERANCEUR    Imaging: Dg Chest Port 1 View  Result Date: 11/05/2016 CLINICAL DATA:  Encounter for central line EXAM: PORTABLE CHEST 1 VIEW COMPARISON:  Earlier today FINDINGS: Right neck catheter with tip at or above the thoracic inlet, question EJ placement. No visible pneumothorax. No mediastinal widening. Endotracheal tube tip between the clavicular heads and carina. Feeding tube at least reaches the stomach. There is a left brachiocephalic vascular stent. Mild cardiomegaly. Streaky perihilar opacities that are improved. There is pleural calcification medially in the right chest. Case discussed with ICU nurse. He said he will show it to Dr Titus Mould to be sure catheter is in expected position. IMPRESSION: 1. Right neck catheter with tip at or above the thoracic inlet, question EJ placement. No visible pneumothorax. No mediastinal widening. 2. Improved lung volumes and decreased atelectasis compared earlier today. Electronically Signed   By: Monte Fantasia M.D.   On: 11/05/2016 15:20   Dg Chest Port 1 View  Result Date: 11/05/2016 CLINICAL DATA:  Fall ventilator EXAM:  PORTABLE CHEST 1 VIEW COMPARISON:  11/02/2016 FINDINGS: Endotracheal tube is unchanged. Cardiomegaly. Left lower lobe airspace opacity is stable. No confluent opacity on the right. No visible effusions. IMPRESSION: Stable left basilar atelectasis or infiltrate. Electronically Signed   By: Rolm Baptise M.D.   On: 11/05/2016 10:35     Medications:   . dextrose 50 mL/hr at 11/06/16 0600  . feeding supplement (VITAL AF 1.2 CAL) 1,000 mL (11/06/16 0600)  . lacosamide (VIMPAT) IV Stopped (11/05/16 2242)  . levETIRAcetam Stopped (11/06/16 0551)  . levETIRAcetam Stopped (11/05/16 2033)  . niCARDipine 15 mg/hr (11/06/16 0701)  . propofol (DIPRIVAN) infusion 10 mcg/kg/min (11/06/16 0716)   . bethanechol  10 mg Oral TID  . chlorhexidine gluconate (MEDLINE KIT)  15 mL Mouth Rinse BID  . Chlorhexidine Gluconate Cloth  6 each Topical Q0600  . cholecalciferol  2,000 Units Oral Daily  . dexamethasone  4 mg Intravenous Q12H  . heparin subcutaneous  5,000 Units Subcutaneous Q12H  . hydrALAZINE  100 mg Oral Q8H  . insulin aspart  0-20 Units Subcutaneous Q4H  . insulin aspart  6 Units Subcutaneous Q4H  . insulin glargine  25 Units Subcutaneous Daily  . lactulose  30 g Oral TID  . mouth rinse  15 mL Mouth Rinse 10 times per day  . metoprolol tartrate  50 mg Per Tube BID  . mycophenolate  250 mg Oral BID  . pantoprazole sodium  40 mg Per Tube Q24H  . pravastatin  20 mg Per Tube q1800  . sodium chloride flush  10-40 mL Intracatheter Q12H  . sodium chloride flush  3 mL Intravenous Q12H  . tacrolimus  3 mg Oral BID   Or  . tacrolimus  1.5 mg Sublingual BID   acetaminophen **OR** acetaminophen, albuterol,  bisacodyl, camphor-menthol, hydrALAZINE, HYDROmorphone (DILAUDID) injection, labetalol, levalbuterol, naLOXone (NARCAN)  injection, [DISCONTINUED] ondansetron **OR** ondansetron (ZOFRAN) IV, promethazine, senna-docusate, sodium chloride flush  Assessment/ Plan:  1. Frontal lobe lesion/left sided  weakness/lethargy - s/p craniotomy. Path results Glioma Appreciate assistance from Dr Mickeal Skinner 2. Vent - per CCM 3. Renal xpl #2 - BL creatinine 1.5-1.7. AKI on CKD (creatinine 2.29 on admission) on setting of hospitalization for possibly stroke/seizure/possible brain tumor with decreased mental status. Creatinine at baseline. Getting IV steroids (currently decadron for brain issues so pred on hold) and IV mycophenolate, SL prograf at 1/2 dose. TAC 4.7on 9/30 (did not come back until 10/2) Reduced prograf dosing to 3 mg BID 10/2, and 1.5 mg BID 10/4 (SL); creatinine is stable 1. RepeatedTAC level 4.7 will repeat again 10/14 4. Metabolic acidosis - resolved 5. Hypokalemia - replete prn 6. Hypernatremia - Gettingfree water via OGT. Slow improvement still 146Discussed with critical care they are happy with mild hypernatremia  7. Systolic and diastolic HF 8. HTN - currently IV metoprolol 9. Assymetric RUE edema - [place] UE today brachial vein DVT (no extension into axillary v) -CCM aware. I would presume heparin contraindicated in light of recent craniotomy 10. Post op Hb variation - Hb 12->9.6 >8.9.>8 Trend 11. Anasarca There appears to be general third spacing and despite intravascular volume contraction-He has extensive upper extremity clot that is leading to the upper extremity edema        LOS: 16 Garrell Flagg W @TODAY @9 :19 AM

## 2016-11-07 LAB — CBC
HEMATOCRIT: 27.9 % — AB (ref 39.0–52.0)
HEMOGLOBIN: 9.1 g/dL — AB (ref 13.0–17.0)
MCH: 29.4 pg (ref 26.0–34.0)
MCHC: 32.6 g/dL (ref 30.0–36.0)
MCV: 90.3 fL (ref 78.0–100.0)
Platelets: 222 10*3/uL (ref 150–400)
RBC: 3.09 MIL/uL — AB (ref 4.22–5.81)
RDW: 14.9 % (ref 11.5–15.5)
WBC: 9.2 10*3/uL (ref 4.0–10.5)

## 2016-11-07 LAB — GLUCOSE, CAPILLARY
GLUCOSE-CAPILLARY: 210 mg/dL — AB (ref 65–99)
GLUCOSE-CAPILLARY: 227 mg/dL — AB (ref 65–99)
GLUCOSE-CAPILLARY: 228 mg/dL — AB (ref 65–99)
Glucose-Capillary: 156 mg/dL — ABNORMAL HIGH (ref 65–99)
Glucose-Capillary: 153 mg/dL — ABNORMAL HIGH (ref 65–99)
Glucose-Capillary: 198 mg/dL — ABNORMAL HIGH (ref 65–99)

## 2016-11-07 LAB — BASIC METABOLIC PANEL
Anion gap: 10 (ref 5–15)
BUN: 44 mg/dL — ABNORMAL HIGH (ref 6–20)
CO2: 20 mmol/L — ABNORMAL LOW (ref 22–32)
Calcium: 9.3 mg/dL (ref 8.9–10.3)
Chloride: 118 mmol/L — ABNORMAL HIGH (ref 101–111)
Creatinine, Ser: 1.05 mg/dL (ref 0.61–1.24)
GFR calc Af Amer: >60 mL/min (ref >60)
GFR calc non Af Amer: >60 mL/min (ref >60)
Glucose, Bld: 175 mg/dL — ABNORMAL HIGH (ref 65–99)
Potassium: 4.4 mmol/L (ref 3.5–5.1)
Sodium: 148 mmol/L — ABNORMAL HIGH (ref 135–145)

## 2016-11-07 LAB — MAGNESIUM: Magnesium: 1.9 mg/dL (ref 1.7–2.4)

## 2016-11-07 LAB — JC VIRUS, PCR CSF: JC Virus PCR, CSF: NEGATIVE

## 2016-11-07 LAB — TRIGLYCERIDES: Triglycerides: 92 mg/dL (ref <150)

## 2016-11-07 LAB — AMMONIA: AMMONIA: 38 umol/L — AB (ref 9–35)

## 2016-11-07 NOTE — Progress Notes (Signed)
PULMONARY / CRITICAL CARE MEDICINE   Name: Roger Gibson MRN: [id number] DOB: [birth year]    ADMISSION DATE:  10/19/2016 CONSULTATION DATE:  11/04/2016  REFERRING MD: Dr. Wynelle Cleveland, [facility]  CHIEF COMPLAINT:  Lt arm weakness  HISTORY OF PRESENT ILLNESS:   71 yo male with Lt arm weakness and found to have frontal lobe mass.  Had craniotomy 10/05 and remained on vent post op.  PMHx of ESRD, HTN, combined CHF, Rt renal cell cancer s/p transplant.  SUBJECTIVE:  R neck IV d/c'd, not functioning. L femoral line remains in place Has tolerated few hours PSV 10, 10/13 and 10/14  VITAL SIGNS: BP (!) 145/69   Pulse 78   Temp 98.7 F (37.1 C) (Axillary)   Resp (!) 23   Ht 5\' 10"  (1.778 m)   Wt 96.9 kg (213 lb 10 oz)   SpO2 100%   BMI 30.65 kg/m   VENTILATOR SETTINGS: Vent Mode: PSV;CPAP FiO2 (%):  [30 %] 30 % Set Rate:  [18 bmp] 18 bmp Vt Set:  [580 mL] 580 mL PEEP:  [5 cmH20] 5 cmH20 Pressure Support:  [10 cmH20-15 cmH20] 10 cmH20 Plateau Pressure:  [18 cmH20-23 cmH20] 18 cmH20  INTAKE / OUTPUT: I/O last 3 completed shifts: In: 7296.8 [I.V.:3519.3; NG/GT:3240; IV Piggyback:537.5] Out: 7829 [Urine:3275; Stool:450]  PHYSICAL EXAMINATION:  General: Ill-appearing man, mechanically ventilated Neuro: Opens eyes to voice, has intermittently followed some commands but not currently, will not move his extremities HEENT: ET tube in position, scalp staples clean and dry PULM: coarse bilaterally, no crackles, no wheezing CV:  Regular, no murmur GI: soft, positive bowel sounds, difficult to assess tenderness Extremities: bilateral upper and lower extremity edema, unchanged   LABS:  BMET  Recent Labs Lab 11/04/16 1715 11/05/16 0317 11/07/16 0443  NA 149* 146* 148*  K 4.0 3.8 4.4  CL 121* 118* 118*  CO2 24 22 20*  BUN 35* 33* 44*  CREATININE 0.84 0.80 1.05  GLUCOSE 289* 301* 175*    Electrolytes  Recent Labs Lab 11/01/16 0340 11/01/16 1456  11/03/16 0500   11/04/16 0404  11/04/16 1715 11/05/16 0317 11/07/16 0443  CALCIUM 8.4*  --   < > 9.6  < > 9.5  < > 9.5 9.5 9.3  MG 1.9 1.8  --  2.2  --  1.9  --   --   --  1.9  PHOS 2.8 2.3*  --   --   --  1.7*  --   --   --   --   < > = values in this interval not displayed.  CBC  Recent Labs Lab 11/01/16 0340 11/04/16 0404 11/07/16 0443  WBC 4.7 6.7 9.2  HGB 8.9* 8.0* 9.1*  HCT 29.4* 25.5* 27.9*  PLT 117* 259 222    Coag's No results for input(s): APTT, INR in the last 168 hours.  Sepsis Markers No results for input(s): LATICACIDVEN, PROCALCITON, O2SATVEN in the last 168 hours.  ABG No results for input(s): PHART, PCO2ART, PO2ART in the last 168 hours.  Liver Enzymes  Recent Labs Lab 11/04/16 1012  AST 68*  ALT 43  ALKPHOS 62  BILITOT 0.4  ALBUMIN 1.9*    Cardiac Enzymes No results for input(s): TROPONINI, PROBNP in the last 168 hours.  Glucose  Recent Labs Lab 11/06/16 1206 11/06/16 1539 11/06/16 2016 11/06/16 2352 11/07/16 0331 11/07/16 0747  GLUCAP 157* 180* 184* 165* 156* 153*    Imaging No results found.   STUDIES:  EEG 9/27 >>  lateralized periodic discharge Rt posterior quadrant with delta morphology EEG 10/9 >> diffuse encephalopathy, more severe cortical dysfunction of R hemisphere, no seizures   Echo 9/30 >> EF 55 to 60%, mod LVH, grade 1 DD, mild MR  MRI 10/8 >> Post-op changes surrounding biopsy site with residual mass and edema   CULTURES: Blood 10/01 >> negative Brain Bx 10/05 >>Grade III Glioma   ANTIBIOTICS: None  SIGNIFICANT EVENTS: 09/27 Admit 10/05 Craniotomy with biopsy collected 10/8- not waking up, neuro re called  LINES/TUBES: ETT 10/05 >>  Lt femoral CVL 10/05 >> Right IJ single-lumen IV 10/12 >>   ASSESSMENT / PLAN:  NEURO: Acute encephalopathy with bilateral upper extremity paresis Frontal lobe mass s/p craniotomy. Bx with Grade III Glioma  Continue current antiepileptic regimen Continue dexamethasone as  ordered Appreciate oncology assistance. Molecular studies ordered and pending. Unclear whether he will ever be a candidate for chemotherapy based on his current degree of debilitation  RESP Compromised airway, Acute resp failure Altered mental status the large contributor, consider also the contribution of volume overload Continue PRVC, work towards spontaneous breathing trials as he can tolerate. Unclear that his neurological status and airway protection will allow extubation. Suspect that he would require tracheostomy if we were to push forward. Unclear that this is a good option given his overall prognosis. Need to continue to discuss with his family  ESRD. S/p renal transplant. Hypernatremia. Continue free water flushes Continue immmunosuppression with tacrolimus and mycophenolate Follow BMP, urine output Avoid nephrotoxins  GI: GI bleed 10/03. Hyperammonemia 132 on 10/9 > 38 on 10/14 Continue pantoprazole Continue tube feeding per protocol Lactulose scheduled to end on 10/14  ENDO Hyperglycemia Continue Lantus, sliding scale insulin per protocol  CV: Hypertension, HLD. Nicardipine drip weaned to off Continue enteral hydralazine, metoprolol Labetalol as needed, goal SBP less than 160 Leave femoral CVC in place for now. Very few other options for IV access  HEME: Anemia of critical illness, chronic disease, and GI bleed. Thrombocytopenia. DVT rt brachial vein, no LE DVT Subcutaneous heparin acceptable, no full dose heparin until at least 10/19 post glioma resection  SUP - Protonix Nutrition -  Continue tube feeding  Goals of care - full code  Family: reviewed status, overall prognosis, barriers to progress with his wife at bedside on 10/14. We will need to continue discussions, unclear that he will recover from this critical illness. If so he will certainly be recovering to situation that itself carries a poor prognosis, glioma.   Independent CC time 33  minutes  Baltazar Apo, MD, PhD 11/07/2016, 10:14 AM [facility] [phone] or if no answer [phone]

## 2016-11-07 NOTE — Progress Notes (Signed)
Placed patient back on PRVC with documented settings per increaced WOB and VE. Patient currently tolerating well, RCP will reassess per weaning protocol. RCP will continue to monitor.

## 2016-11-07 NOTE — Progress Notes (Signed)
RT note- failed wean on 5/5, currently weaning on 10/5, continue to monitor.

## 2016-11-07 NOTE — Progress Notes (Signed)
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 Baseline creatinine 1.5 and 1.7. Pred/prograf/myfortic. Other PMH hypertension, chronic combined systolic/diastolic CHF (94-70%), history of right kidney cancer. Presented to ED with left-sided weakness, admitted with sz- brain imaging possible/probable frontal lobe brain mass.Underwent a craniotomy and biopsy 10/5. Poor neurological function noted and CT head demonstrating post op changes. RUE brachial vein DVT noted for arm edema   Objective:  Vital signs in last 24 hours:  Temp:  [98.7 F (37.1 C)-99.6 F (37.6 C)] 98.7 F (37.1 C) (10/14 0400) Pulse Rate:  [72-92] 78 (10/14 0830) Resp:  [13-33] 23 (10/14 0830) BP: (126-173)/(60-72) 145/69 (10/14 0830) SpO2:  [99 %-100 %] 100 % (10/14 0833) FiO2 (%):  [30 %] 30 % (10/14 0900) Weight:  [213 lb 10 oz (96.9 kg)] 213 lb 10 oz (96.9 kg) (10/14 0500)  Weight change: 1 lb 8.7 oz (0.7 kg) Filed Weights   11/05/16 0428 11/06/16 0400 11/07/16 0500  Weight: 214 lb 4.6 oz (97.2 kg) 212 lb 1.3 oz (96.2 kg) 213 lb 10 oz (96.9 kg)    Intake/Output: I/O last 3 completed shifts: In: 7296.8 [I.V.:3519.3; NG/GT:3240; IV Piggyback:537.5] Out: 9628 [Urine:3275; Stool:450]   Intake/Output this shift:  Total I/O In: 302.4 [I.V.:172.4; NG/GT:130] Out: 200 [Urine:200]  CVS- RRR RS- CTA ABD- BS present soft non-distended EXT- edema noted RUE and LUE with very little edema in lower extremities _ DVTs noted on ultrasound and patient will not be able to be anticoagulated until later this month due to recent neurosurgery   Basic Metabolic Panel:  Recent Labs Lab 10/31/16 1419 11/01/16 0340 11/01/16 1456  11/03/16 0500  11/04/16 0404 11/04/16 1012  11/04/16 1715 11/05/16 0317 11/07/16 0443  NA  --  148*  --   < > 150*  < > 149* 149* 149* 146* 148*  K  --  3.7  --   < > 4.3  < > 4.1 4.2 4.0 3.8 4.4  CL  --  117*  --   < > 115*  < > 120* 119* 121* 118* 118*  CO2  --  24  --   < > 25  < > 25 22 24 22  20*  GLUCOSE  --  191*  --   < > 410*  < > 310* 285* 289* 301* 175*  BUN  --  29*  --   < > 36*  < > 35* 36* 35* 33* 44*  CREATININE  --  1.14  --   < > 1.29*  < > 0.88 0.88 0.84 0.80 1.05  CALCIUM  --  8.4*  --   < > 9.6  < > 9.5 9.7 9.5 9.5 9.3  MG 1.7 1.9 1.8  --  2.2  --  1.9  --   --   --  1.9  PHOS 2.1* 2.8 2.3*  --   --   --  1.7*  --   --   --   --   < > = values in this interval not displayed.  Liver Function Tests:  Recent Labs Lab 11/04/16 1012  AST 68*  ALT 43  ALKPHOS 62  BILITOT 0.4  PROT 5.5*  ALBUMIN 1.9*   No results for input(s): LIPASE, AMYLASE in the last 168 hours.  Recent Labs  Lab 11/01/16 1515 11/02/16 0430 11/07/16 0446  AMMONIA 86* 132* 38*    CBC:  Recent Labs Lab 11/01/16 0340 11/04/16 0404 11/07/16 0443  WBC 4.7 6.7 9.2  HGB 8.9* 8.0* 9.1*  HCT 29.4* 25.5* 27.9*  MCV 91.9 90.4 90.3  PLT 117* 259 222    Cardiac Enzymes: No results for input(s): CKTOTAL, CKMB, CKMBINDEX, TROPONINI in the last 168 hours.  BNP: Invalid input(s): POCBNP  CBG:  Recent Labs Lab 11/06/16 1539 11/06/16 2016 11/06/16 2352 11/07/16 0331 11/07/16 0747  GLUCAP 180* 184* 165* 156* 153*    Microbiology: Results for orders placed or performed during the hospital encounter of 10/19/2016  MRSA PCR Screening     Status: None   Collection Time: 10/23/16  1:58 AM  Result Value Ref Range Status   MRSA by PCR NEGATIVE NEGATIVE Final    Comment:        The GeneXpert MRSA Assay (FDA approved for NASAL specimens only), is one component of a comprehensive MRSA colonization surveillance program. It is not intended to diagnose MRSA infection nor to guide or monitor treatment for MRSA infections.    Culture, Urine     Status: Abnormal   Collection Time: 10/23/16 11:12 AM  Result Value Ref Range Status   Specimen Description URINE, RANDOM  Final   Special Requests NONE  Final   Culture MULTIPLE SPECIES PRESENT, SUGGEST RECOLLECTION (A)  Final   Report Status 10/24/2016 FINAL  Final  Culture, blood (Routine X 2) w Reflex to ID Panel     Status: None   Collection Time: 10/25/16 12:04 PM  Result Value Ref Range Status   Specimen Description BLOOD LEFT HAND  Final   Special Requests IN PEDIATRIC BOTTLE Blood Culture adequate volume  Final   Culture NO GROWTH 5 DAYS  Final   Report Status 10/30/2016 FINAL  Final  Culture, blood (Routine X 2) w Reflex to ID Panel     Status: None   Collection Time: 10/25/16 12:04 PM  Result Value Ref Range Status   Specimen Description BLOOD LEFT HAND  Final   Special Requests IN PEDIATRIC BOTTLE Blood Culture adequate volume  Final   Culture NO GROWTH 5 DAYS  Final   Report Status 10/30/2016 FINAL  Final  Fungus Culture With Stain     Status: None (Preliminary result)   Collection Time: 11/21/2016  3:18 PM  Result Value Ref Range Status   Fungus Stain Final report  Final    Comment: (NOTE) Performed At: [facility] [place], [place] [id number] Lindon Romp MD SH:[id number]    Fungus (Mycology) Culture PENDING  Incomplete   Fungal Source TISSUE  Final  Acid Fast Smear (AFB)     Status: None   Collection Time: 11/08/2016  3:18 PM  Result Value Ref Range Status   AFB Specimen Processing Comment  Final    Comment: Tissue Grinding and Digestion/Decontamination   Acid Fast Smear Negative  Final    Comment: (NOTE) Performed At: [facility] [place], [place] [id number] Lindon Romp MD NO:[id number]    Source (AFB) TISSUE  Final  Fungus Culture Result     Status: None   Collection Time: 11/15/2016  3:18 PM  Result Value Ref Range Status   Result 1 Comment  Final    Comment: (NOTE) KOH/Calcofluor  preparation:  no fungus observed. Performed At: [facility] [place], [place] [id number] Lindon Romp MD UT:[id number]  CSF culture with Stat gram stain     Status: None   Collection Time: 11/02/16 12:13 PM  Result Value Ref Range Status   Specimen Description CSF  Final   Special Requests NONE  Final   Gram Stain   Final    CYTOSPIN SMEAR WBC PRESENT, PREDOMINANTLY MONONUCLEAR NO ORGANISMS SEEN    Culture NO GROWTH 3 DAYS  Final   Report Status 11/05/2016 FINAL  Final    Coagulation Studies: No results for input(s): LABPROT, INR in the last 72 hours.  Urinalysis: No results for input(s): COLORURINE, LABSPEC, PHURINE, GLUCOSEU, HGBUR, BILIRUBINUR, KETONESUR, PROTEINUR, UROBILINOGEN, NITRITE, LEUKOCYTESUR in the last 72 hours.  Invalid input(s): APPERANCEUR    Imaging: Dg Chest Port 1 View  Result Date: 11/05/2016 CLINICAL DATA:  Encounter for central line EXAM: PORTABLE CHEST 1 VIEW COMPARISON:  Earlier today FINDINGS: Right neck catheter with tip at or above the thoracic inlet, question EJ placement. No visible pneumothorax. No mediastinal widening. Endotracheal tube tip between the clavicular heads and carina. Feeding tube at least reaches the stomach. There is a left brachiocephalic vascular stent. Mild cardiomegaly. Streaky perihilar opacities that are improved. There is pleural calcification medially in the right chest. Case discussed with ICU nurse. He said he will show it to Dr Titus Mould to be sure catheter is in expected position. IMPRESSION: 1. Right neck catheter with tip at or above the thoracic inlet, question EJ placement. No visible pneumothorax. No mediastinal widening. 2. Improved lung volumes and decreased atelectasis compared earlier today. Electronically Signed   By: Monte Fantasia M.D.   On: 11/05/2016 15:20     Medications:   . feeding supplement (VITAL AF 1.2 CAL) 1,000 mL (11/07/16 0700)  . lacosamide (VIMPAT) IV Stopped  (11/06/16 2224)  . levETIRAcetam 750 mg (11/07/16 1044)  . niCARDipine 15 mg/hr (11/07/16 0700)  . propofol (DIPRIVAN) infusion 20.072 mcg/kg/min (11/07/16 0700)   . bethanechol  10 mg Oral TID  . chlorhexidine gluconate (MEDLINE KIT)  15 mL Mouth Rinse BID  . Chlorhexidine Gluconate Cloth  6 each Topical Q0600  . cholecalciferol  2,000 Units Oral Daily  . dexamethasone  4 mg Intravenous Q12H  . free water  300 mL Per Tube Q6H  . heparin subcutaneous  5,000 Units Subcutaneous Q12H  . hydrALAZINE  100 mg Oral Q8H  . insulin aspart  0-20 Units Subcutaneous Q4H  . insulin aspart  6 Units Subcutaneous Q4H  . insulin glargine  25 Units Subcutaneous Daily  . lactulose  30 g Oral TID  . mouth rinse  15 mL Mouth Rinse 10 times per day  . metoprolol tartrate  50 mg Per Tube BID  . mycophenolate  250 mg Oral BID  . pantoprazole sodium  40 mg Per Tube Q24H  . pravastatin  20 mg Per Tube q1800  . sodium chloride flush  10-40 mL Intracatheter Q12H  . sodium chloride flush  3 mL Intravenous Q12H  . tacrolimus  3 mg Oral BID   Or  . tacrolimus  1.5 mg Sublingual BID   acetaminophen **OR** acetaminophen, albuterol, bisacodyl, camphor-menthol, hydrALAZINE, HYDROmorphone (DILAUDID) injection, labetalol, levalbuterol, naLOXone (NARCAN)  injection, [DISCONTINUED] ondansetron **OR** ondansetron (ZOFRAN) IV, promethazine, senna-docusate, sodium chloride flush  Assessment/ Plan:  1. Frontal lobe lesion/left sided weakness/lethargy - s/p craniotomy. Path results Glioma Appreciate assistance from Dr Mickeal Skinner 2. Vent - per CCM 3. Renal xpl #2 - BL creatinine 1.5-1.7. AKI on CKD (creatinine 2.29 on admission) on setting of hospitalization for possibly  stroke/seizure/possible brain tumor with decreased mental status. Creatinine at baseline. Getting IV steroids (currently decadron for brain issues so pred on hold) and IV mycophenolate, SL prograf at 1/2 dose. TAC 4.7on 9/30 (did not come back until 10/2) Reduced  prograf dosing to 3 mg BID 10/2, and 1.5 mg BID 10/4 (SL); creatinine is stable 1. RepeatedTAC level 4.7 will repeat again 10/14 4. Metabolic acidosis - resolved 5. Hypokalemia - replete prn 6. Hypernatremia - Gettingfree water via OGT. Slow improvement still 146Discussed with critical care they are happy with mild hypernatremia  7. Systolic and diastolic HF 8. HTN - currently IV metoprolol 9. Assymetric RUE edema - [place] UE today brachial vein DVT (no extension into axillary v) -CCM aware. I would presume heparin contraindicated in light of recent craniotomy 10. Post op Hb variation - Hb 12->9.6 >8.9.>8 Trend 11. Anasarca There appears to be general third spacing and despite intravascular volume contraction-He has extensive upper extremity clot that is leading to the upper extremity edema   Had extensive conversation this morning with Mrs Nipp about goals of care and voiced my concerns regarding the long term prognosis. This is incredibly difficult as the events were sudden and Mr Perrier was in fairly good health prior to the admission. We shall continue ongoing discussions and feel that Palliative Medicine may be very helpful   LOS: 17 Damita Eppard W @TODAY @11 :31 AM

## 2016-11-08 ENCOUNTER — Inpatient Hospital Stay (HOSPITAL_COMMUNITY): Payer: Medicare HMO

## 2016-11-08 LAB — BLOOD GAS, ARTERIAL
Acid-base deficit: 2.3 mmol/L — ABNORMAL HIGH (ref 0.0–2.0)
BICARBONATE: 21.4 mmol/L (ref 20.0–28.0)
Drawn by: 331761
FIO2: 30
Mode: POSITIVE
O2 Saturation: 97.4 %
PATIENT TEMPERATURE: 98.1
PEEP/CPAP: 5 cmH2O
PO2 ART: 99.4 mmHg (ref 83.0–108.0)
Pressure support: 15 cmH2O
pCO2 arterial: 32.5 mmHg (ref 32.0–48.0)
pH, Arterial: 7.433 (ref 7.350–7.450)

## 2016-11-08 LAB — TACROLIMUS LEVEL: Tacrolimus (FK506) - LabCorp: 7.3 ng/mL (ref 2.0–20.0)

## 2016-11-08 LAB — GLUCOSE, CAPILLARY
GLUCOSE-CAPILLARY: 147 mg/dL — AB (ref 65–99)
GLUCOSE-CAPILLARY: 225 mg/dL — AB (ref 65–99)
GLUCOSE-CAPILLARY: 242 mg/dL — AB (ref 65–99)
Glucose-Capillary: 261 mg/dL — ABNORMAL HIGH (ref 65–99)
Glucose-Capillary: 220 mg/dL — ABNORMAL HIGH (ref 65–99)
Glucose-Capillary: 170 mg/dL — ABNORMAL HIGH (ref 65–99)

## 2016-11-08 MED ORDER — PRO-STAT SUGAR FREE PO LIQD
30.0000 mL | Freq: Three times a day (TID) | ORAL | Status: DC
  Administered 2016-11-08 – 2016-11-12 (×12): 30 mL
  Filled 2016-11-08 (×12): qty 30

## 2016-11-08 MED ORDER — FREE WATER
240.0000 mL | Status: DC
  Administered 2016-11-08 – 2016-11-09 (×6): 240 mL

## 2016-11-08 MED ORDER — FENTANYL CITRATE (PF) 100 MCG/2ML IJ SOLN
50.0000 ug | INTRAMUSCULAR | Status: DC | PRN
  Administered 2016-11-08 – 2016-11-10 (×5): 50 ug via INTRAVENOUS
  Filled 2016-11-08 (×5): qty 2

## 2016-11-08 MED ORDER — METOPROLOL TARTRATE 25 MG/10 ML ORAL SUSPENSION
75.0000 mg | Freq: Two times a day (BID) | ORAL | Status: DC
  Administered 2016-11-08 (×2): 75 mg
  Filled 2016-11-08 (×2): qty 30

## 2016-11-08 MED ORDER — DOCUSATE SODIUM 50 MG/5ML PO LIQD: 100.0000 mg | Freq: Two times a day (BID) | ORAL | Status: DC | PRN

## 2016-11-08 MED ORDER — GLUCERNA 1.2 CAL PO LIQD
1000.0000 mL | ORAL | Status: DC
  Administered 2016-11-08 – 2016-11-11 (×4): 1000 mL
  Filled 2016-11-08 (×11): qty 1000

## 2016-11-08 MED ORDER — FUROSEMIDE 10 MG/ML IJ SOLN
40.0000 mg | Freq: Once | INTRAMUSCULAR | Status: AC
  Administered 2016-11-08: 40 mg via INTRAVENOUS
  Filled 2016-11-08: qty 4

## 2016-11-08 MED ORDER — FENTANYL CITRATE (PF) 100 MCG/2ML IJ SOLN
50.0000 ug | INTRAMUSCULAR | Status: DC | PRN
  Administered 2016-11-08 (×2): 50 ug via INTRAVENOUS
  Filled 2016-11-08 (×2): qty 2

## 2016-11-08 NOTE — Progress Notes (Signed)
Inpatient Diabetes Program Recommendations  AACE/ADA: New Consensus Statement on Inpatient Glycemic Control (2015)  Target Ranges:  Prepandial:   less than 140 mg/dL      Peak postprandial:   less than 180 mg/dL (1-2 hours)      Critically ill patients:  140 - 180 mg/dL   Lab Results  Component Value Date   GLUCAP 261 (H) 11/08/2016   HGBA1C 5.2 03/22/2005    Review of Glycemic Control Results for Roger Gibson, Roger Gibson (MRN [id number]) as of 11/08/2016 13:48  Ref. Range 11/07/2016 20:58 11/07/2016 23:45 11/08/2016 04:14 11/08/2016 08:08 11/08/2016 12:17  Glucose-Capillary Latest Ref Range: 65 - 99 mg/dL 228 (H) 227 (H) 242 (H) 225 (H) 261 (H)   Inpatient Diabetes Program Recommendations:    Noted hyperglycemia. Please consider: -Increase Novolog tube feeding coverage to 8 units q 4 hrs. (hold if feeding stopped or held for any reason)  Thank you, Bethena Roys E. Milanie Rosenfield, RN, MSN, CDE  Diabetes Coordinator Inpatient Glycemic Control Team Team Pager [phone] (8am-5pm) 11/08/2016 1:59 PM

## 2016-11-08 NOTE — Progress Notes (Signed)
PULMONARY / CRITICAL CARE MEDICINE   Name: Roger Gibson MRN: [id number] DOB: [birth year]    ADMISSION DATE:  10/08/2016 CONSULTATION DATE:  10/25/2016  REFERRING MD: Dr. Wynelle Cleveland, [facility]  CHIEF COMPLAINT:  Lt arm weakness  HISTORY OF PRESENT ILLNESS:   71 yo male with Hx HTN, Anemia, Right renal cell cancer with ESRD s/p transplant, CHF now admitted 9/27 with Lt arm weakness and found to have frontal lobe mass. Had craniotomy 10/05 and remained on vent post op.   SUBJECTIVE:  L femoral line remains in place.  Tolerated PSV with PS10/PEEP5 on 10/14 for 5 hours after which time had increased WOB.   VITAL SIGNS: BP (!) 157/73   Pulse 82   Temp 98.1 F (36.7 C) (Axillary)   Resp (!) 30   Ht 5\' 10"  (1.778 m)   Wt 100.2 kg (220 lb 14.4 oz)   SpO2 100%   BMI 31.70 kg/m   VENTILATOR SETTINGS: Vent Mode: PSV;CPAP FiO2 (%):  [30 %] 30 % Set Rate:  [18 bmp] 18 bmp Vt Set:  [580 mL-584 mL] 580 mL PEEP:  [5 cmH20] 5 cmH20 Pressure Support:  [15 cmH20] 15 cmH20 Plateau Pressure:  [14 cmH20-24 cmH20] 14 cmH20  INTAKE / OUTPUT: I/O last 3 completed shifts: In: 7405.8 [I.V.:3135.8; NG/GT:3840; IV Piggyback:430] Out: 4900 [Urine:3900; Stool:1000]  PHYSICAL EXAMINATION: General: Ill-appearing man, mechanically ventilated Neuro: Opens eyes to voice, will not move his extremities, not following commands HEENT: 7.5 ETT in position with no secretions to suction, scalp staples clean and dry PULM: CTA b/l, no crackles, no wheezing CV:  Regular, no murmur GI: soft, NTND, positive bowel sounds Extremities: 1+ BUE and Trace BLE edema  LABS:  BMET  Recent Labs Lab 11/04/16 1715 11/05/16 0317 11/07/16 0443  NA 149* 146* 148*  K 4.0 3.8 4.4  CL 121* 118* 118*  CO2 24 22 20*  BUN 35* 33* 44*  CREATININE 0.84 0.80 1.05  GLUCOSE 289* 301* 175*    Electrolytes  Recent Labs Lab 11/01/16 1456  11/03/16 0500  11/04/16 0404  11/04/16 1715 11/05/16 0317 11/07/16 0443  CALCIUM   --   < > 9.6  < > 9.5  < > 9.5 9.5 9.3  MG 1.8  --  2.2  --  1.9  --   --   --  1.9  PHOS 2.3*  --   --   --  1.7*  --   --   --   --   < > = values in this interval not displayed.  CBC  Recent Labs Lab 11/04/16 0404 11/07/16 0443  WBC 6.7 9.2  HGB 8.0* 9.1*  HCT 25.5* 27.9*  PLT 259 222    Coag's No results for input(s): APTT, INR in the last 168 hours.  Sepsis Markers No results for input(s): LATICACIDVEN, PROCALCITON, O2SATVEN in the last 168 hours.  ABG No results for input(s): PHART, PCO2ART, PO2ART in the last 168 hours.  Liver Enzymes  Recent Labs Lab 11/04/16 1012  AST 68*  ALT 43  ALKPHOS 62  BILITOT 0.4  ALBUMIN 1.9*    Cardiac Enzymes No results for input(s): TROPONINI, PROBNP in the last 168 hours.  Glucose  Recent Labs Lab 11/07/16 1256 11/07/16 1609 11/07/16 2058 11/07/16 2345 11/08/16 0414 11/08/16 0808  GLUCAP 198* 210* 228* 227* 242* 225*    Imaging No results found.   STUDIES:  EEG 9/27 >> lateralized periodic discharge Rt posterior quadrant with delta morphology EEG 10/9 >>  diffuse encephalopathy, more severe cortical dysfunction of R hemisphere, no seizures   Echo 9/30 >> EF 55 to 60%, mod LVH, grade 1 DD, mild MR  MRI 10/8 >> Post-op changes surrounding biopsy site with residual mass and edema   CULTURES: Blood 10/01 >> negative Brain Bx 10/05 >>Grade III Glioma   ANTIBIOTICS: None  SIGNIFICANT EVENTS: 09/27 Admit 10/05 Craniotomy with biopsy collected 10/8- not waking up, neuro re called  LINES/TUBES: ETT 10/05 >>  Lt femoral CVL 10/05 >> Right IJ single-lumen IV 10/12 >>   ASSESSMENT / PLAN: NEURO: 1. Acute encephalopathy with bilateral upper extremity paresis - presumed this acute worsening of mental status to be metabolic in nature. Brain MRI on 10/8 showed no acute changes from prior imaging. EEG on 10/8 was consistent with metabolic encephalopathy. Patient afebrile with no leukocytosis. Has mild  hypernatremia but not severe enough to cause mental status changes. Has not been receiving sedation or pain meds or anxiolytics outside of the propofol gtt. When propofol gtt is paused, patient does not wake up any further but only becomes tachypneic.  - ammonia initially elevated now only 38; continuing lactulose.  - check ABG now.   2. Frontal lobe mass s/p craniotomy. Bx with Grade III Glioma  - Continue current antiepileptic regimen - Continue dexamethasone as ordered - Appreciate oncology assistance. Molecular studies ordered and pending. Unclear whether he will ever be a candidate for chemotherapy based on his current degree of debilitation  RESP: 1. Acute Hypoxic Respiratory failure: - CXR today on my review shows increased LLL infiltrate compared to prior CXR that it clinically more consistent with basilar atelectasis. Also mildly increased perihilar interstitial markings consistent with developing pulmonary edema; no secretions on my suctioning of his ETT. No fever or leukocytosis.  - ABG now - tolerating SBT's well but becomes tachypneic when sedation is held; also mental status is not improved enough for extubation - clinically appears volume overloaded; will give Lasix and re-eval.  - may need to discuss trach soon vs transition of goals of care   RENAL: 1. ESRD, S/p renal transplant: - Continue immmunosuppression with tacrolimus and mycophenolate - Follow BMP, urine output - Avoid nephrotoxins  2. Hypernatremia: - increase free water flushes form 300cc q6hrs (ie 50cc q1hr) to 240cc q4hrs (ie 60cc q1hr)  GI: 1. GI bleed 10/03 (resolved) - Continue pantoprazole  2. Hyperammonemia 132 on 10/9 > 38 on 10/14 - Continue tube feeding per protocol - continue Lactulose  ENDO 1. Hyperglycemia: - Continue Lantus, sliding scale insulin per protocol  CV: 1. Hypertension, HLD. - increase metoprolol from 50mg  PO BID to 75mg  PO BID; continue hydralazine 100mg  PO TID - continue  nicardipine gtt; wean as tolerated - Labetalol as needed, goal SBP less than 160 - Leave femoral CVC in place for now. Very few other options for IV access. Will ask Nephrology is we can place a PICC line  HEME: 1. Anemia (stable) of critical illness, chronic disease, and GI bleed (resolved). - continue to monitor  2. DVT rt brachial vein, no LE DVT - Subcutaneous heparin acceptable, no full dose heparin until at least 10/19 post glioma resection  SUP - Protonix Nutrition -  Continue tube feeding  Goals of care - full code  Family: reviewed status, overall prognosis, barriers to progress with his wife at bedside on 10/14. We will need to continue discussions, unclear that he will recover from this critical illness. If so he will certainly be recovering to situation that itself  carries a poor prognosis, glioma.   Independent CC time 45 minutes  Vernie Murders, MD  11/08/2016, 9:09 AM [facility] [phone] or if no answer [phone]

## 2016-11-08 NOTE — Progress Notes (Signed)
Assessed patient's left arm for both extended dwell PIV and midline catheter.  Patient's arm is very swollen.  Lower left forearm with only the brachial veins that are compressible but these are very small and not well defined at 2 cm deep.  High probability of arterial puncture due to this.  Cephalic vein non compressible near/at AC as well as in upper arm.  Brachial veins seen upper arm at a depth >2.5 cm poorly defined from artery, when probe is not pressing on skin, veins are no longer able to be seen at all with Ultrasound.  Patient is not a candidate for midline or extended dwell PIV at this time due to the above reasons.  Bedside nurse, Sarah, RN present during assessment and shown these findings.  Judson Roch states that she will notify MD.  Carolee Rota, RN VAST

## 2016-11-08 NOTE — Progress Notes (Signed)
Addressed pt's femoral line with MD throughout the day trying to come up with other options for the patient but he has difficult access with his renal history and multiple attempts were made for other central line placements. IV team attempted extended PIV with no success. Patient is running Cardene and it would not be able to run through a peripheral line anyhow. CCM aware. Will readdress in the am. Maui Britten, Rande Brunt, RN

## 2016-11-08 NOTE — Progress Notes (Signed)
Nutrition Follow Up Note   INTERVENTION:   D/C Vital 1.2  Glucerna 1.2 @ 60 ml/hr (1440 ml/day) 30 ml Prostat TID Provides: 2028 kcal (94%), 131 grams protein, and 1166 ml free water.    NUTRITION DIAGNOSIS:   Inadequate oral intake related to inability to eat, lethargy/confusion as evidenced by NPO status. Ongoing.   GOAL:   Provide needs based on ASPEN/SCCM guidelines Met  MONITOR:   Vent status, Labs, Weight trends, TF tolerance, I & O's  ASSESSMENT:   71 year old is chronically immunosuppressed due to renal transplantation. He also has a prior history of renal cancer. 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  Pt s/p craniotomy 10/5. Pt continues to be intubated. Pt with Cortrak tube placed 10/6. Pt is tolerating tube feeds well.  Medications reviewed and include: Vitamin D, dexamethasone, lantus, lactulose  Labs reviewed: Na 148(H) cbgs- [phone]  Patient is currently intubated on ventilator support MV: 16.5 L/min Temp (24hrs), Avg:98.6 F (37 C), Min:97.8 F (36.6 C), Max:99.3 F (37.4 C)  Propofol: none   TF: Vital AF 1.2 @ goal rate of 70m/hr.  Regimen provides 1872kcal/day, 117g/day, 12618mday free water  Diet Order:   NPO  Skin:  Reviewed, no issues  Last BM:  1000 x 24 hrs via rectal tube (on lactulose)  Height:   Ht Readings from Last 1 Encounters:  10/31/2016 _0  (1.778 m)    Weight:   Wt Readings from Last 1 Encounters:  11/08/16 220 lb 14.4 oz (100.2 kg)    Ideal Body Weight:  75.5 kg  BMI:  Body mass index is 31.7 kg/m.  Estimated Nutritional Needs:   Kcal:  1942kcal/day   Protein:  106-124g/day   Fluid:  >2L/day   EDUCATION NEEDS:   Education needs no appropriate at this  time  CaKoleen DistanceS, RD, LDJean Lafitteager #- [phone] Hours Pager: [phone]

## 2016-11-08 NOTE — Progress Notes (Signed)
Patient ID: Roger Gibson, male   DOB: 1945/12/05, 71 y.o.   MRN: [id number] S:No events overnight O:BP (!) 119/56   Pulse 72   Temp 97.8 F (36.6 C) (Axillary)   Resp (!) 23   Ht 5' 10"  (1.778 m)   Wt 100.2 kg (220 lb 14.4 oz)   SpO2 100%   BMI 31.70 kg/m   Intake/Output Summary (Last 24 hours) at 11/08/16 1401 Last data filed at 11/08/16 1300  Gross per 24 hour  Intake          5142.55 ml  Output             4750 ml  Net           392.55 ml   Intake/Output: I/O last 3 completed shifts: In: 7405.8 [I.V.:3135.8; NG/GT:3840; IV HQIONGEXB:284] Out: 1324 [Urine:3900; Stool:1000]  Intake/Output this shift:  Total I/O In: 1509.7 [I.V.:487.2; NG/GT:870; IV Piggyback:152.5] Out: 1725 [Urine:1725] Weight change: 3.3 kg (7 lb 4.4 oz) MWN:[id number] CVS: NSR, no rub Resp: occ rhonchi Abd: +BS Ext: +3 anasarca   Recent Labs Lab 11/01/16 1456  11/03/16 0500 11/03/16 1822 11/04/16 0404 11/04/16 1012 11/04/16 1715 11/05/16 0317 11/07/16 0443  NA  --   < > 150* 148* 149* 149* 149* 146* 148*  K  --   < > 4.3 4.2 4.1 4.2 4.0 3.8 4.4  CL  --   < > 115* 121* 120* 119* 121* 118* 118*  CO2  --   < > 25 23 25 22 24 22  20*  GLUCOSE  --   < > 410* 362* 310* 285* 289* 301* 175*  BUN  --   < > 36* 36* 35* 36* 35* 33* 44*  CREATININE  --   < > 1.29* 0.96 0.88 0.88 0.84 0.80 1.05  ALBUMIN  --   --   --   --   --  1.9*  --   --   --   CALCIUM  --   < > 9.6 9.4 9.5 9.7 9.5 9.5 9.3  PHOS 2.3*  --   --   --  1.7*  --   --   --   --   AST  --   --   --   --   --  68*  --   --   --   ALT  --   --   --   --   --  43  --   --   --   < > = values in this interval not displayed. Liver Function Tests:  Recent Labs Lab 11/04/16 1012  AST 68*  ALT 43  ALKPHOS 62  BILITOT 0.4  PROT 5.5*  ALBUMIN 1.9*   No results for input(s): LIPASE, AMYLASE in the last 168 hours.  Recent Labs Lab 11/01/16 1515 11/02/16 0430 11/07/16 0446  AMMONIA 86* 132* 38*   CBC:  Recent Labs Lab  11/04/16 0404 11/07/16 0443  WBC 6.7 9.2  HGB 8.0* 9.1*  HCT 25.5* 27.9*  MCV 90.4 90.3  PLT 259 222   Cardiac Enzymes: No results for input(s): CKTOTAL, CKMB, CKMBINDEX, TROPONINI in the last 168 hours. CBG:  Recent Labs Lab 11/07/16 2058 11/07/16 2345 11/08/16 0414 11/08/16 0808 11/08/16 1217  GLUCAP 228* 227* 242* 225* 261*    Iron Studies: No results for input(s): IRON, TIBC, TRANSFERRIN, FERRITIN in the last 72 hours. Studies/Results: Dg Chest Port 1 View  Result Date: 11/08/2016 CLINICAL DATA:  Hypoxia EXAM:  PORTABLE CHEST 1 VIEW COMPARISON:  November 05, 2016 FINDINGS: Endotracheal tube tip is 3.2 cm above the carina. Feeding tube tip is below the diaphragm. No pneumothorax. There is focal airspace consolidation in the left base with small left pleural effusion. Lungs elsewhere are clear. Heart is mildly enlarged with pulmonary vascularity within normal limits. There is aortic atherosclerosis. There is a left innominate regions stent no evident bone lesions. No adenopathy evident. IMPRESSION: Tube positions as described without pneumothorax. Patchy consolidation left base with small left pleural effusion. Lungs elsewhere clear. Stable cardiac silhouette. There is aortic atherosclerosis. Aortic Atherosclerosis (ICD10-I70.0). Electronically Signed   By: Lowella Grip III M.D.   On: 11/08/2016 09:29   . bethanechol  10 mg Oral TID  . chlorhexidine gluconate (MEDLINE KIT)  15 mL Mouth Rinse BID  . Chlorhexidine Gluconate Cloth  6 each Topical Q0600  . cholecalciferol  2,000 Units Oral Daily  . dexamethasone  4 mg Intravenous Q12H  . free water  240 mL Per Tube Q4H  . heparin subcutaneous  5,000 Units Subcutaneous Q12H  . hydrALAZINE  100 mg Oral Q8H  . insulin aspart  0-20 Units Subcutaneous Q4H  . insulin aspart  6 Units Subcutaneous Q4H  . insulin glargine  25 Units Subcutaneous Daily  . lactulose  30 g Oral TID  . mouth rinse  15 mL Mouth Rinse 10 times per day  .  metoprolol tartrate  75 mg Per Tube BID  . mycophenolate  250 mg Oral BID  . pantoprazole sodium  40 mg Per Tube Q24H  . pravastatin  20 mg Per Tube q1800  . sodium chloride flush  10-40 mL Intracatheter Q12H  . sodium chloride flush  3 mL Intravenous Q12H  . tacrolimus  3 mg Oral BID   Or  . tacrolimus  1.5 mg Sublingual BID    BMET    Component Value Date/Time   NA 148 (H) 11/07/2016 0443   NA 134 (L) 10/09/2012 1447   K 4.4 11/07/2016 0443   K 5.0 10/11/2012 1044   CL 118 (H) 11/07/2016 0443   CL 94 (L) 10/09/2012 1447   CO2 20 (L) 11/07/2016 0443   CO2 33 (H) 10/09/2012 1447   GLUCOSE 175 (H) 11/07/2016 0443   GLUCOSE 78 10/09/2012 1447   BUN 44 (H) 11/07/2016 0443   BUN 33 (H) 10/09/2012 1447   CREATININE 1.05 11/07/2016 0443   CREATININE 9.78 (H) 10/09/2012 1447   CALCIUM 9.3 11/07/2016 0443   CALCIUM 11.0 (H) 10/09/2012 1447   GFRNONAA >60 11/07/2016 0443   GFRNONAA 5 (L) 10/09/2012 1447   GFRAA >60 11/07/2016 0443   GFRAA 6 (L) 10/09/2012 1447   CBC    Component Value Date/Time   WBC 9.2 11/07/2016 0443   RBC 3.09 (L) 11/07/2016 0443   HGB 9.1 (L) 11/07/2016 0443   HGB 9.5 (L) 10/09/2012 1447   HCT 27.9 (L) 11/07/2016 0443   HCT 29.1 (L) 10/09/2012 1447   PLT 222 11/07/2016 0443   PLT 290 10/09/2012 1447   MCV 90.3 11/07/2016 0443   MCV 91 10/09/2012 1447   MCH 29.4 11/07/2016 0443   MCHC 32.6 11/07/2016 0443   RDW 14.9 11/07/2016 0443   RDW 17.9 (H) 10/09/2012 1447   LYMPHSABS 0.4 (L) 11/05/2016 0213   LYMPHSABS 0.4 (L) 09/22/2012 0551   MONOABS 0.3 11/06/2016 0213   MONOABS 0.2 09/22/2012 0551   EOSABS 0.0 11/16/2016 0213   EOSABS 0.0 09/22/2012 0551   BASOSABS  0.0 11/05/2016 0213   BASOSABS 0.0 09/22/2012 0551     Assessment/Plan:  1. Glioma- s/p craniotomy. 2. S/p Renal transplant (second with baseline Scr 1.5-1.7).  Now on lower dose of prograf and IV mycophenolate.  Awaiting repeat prograf level 3. VDRF- per PCCM 4. Acute  encephalopathy with bilateral upper extremity paresis.  Felt to be metabolic as well as hyperammonemia, now on lactulose 5. Hypernatremia 6. RUE DVT- on subcutaneous heparin.  If need central line access would recommend central PIC placement by IR 7. Anasarca- 3rd spacing due to DVT and hypoalbuminemia. 8. Disposition- poor overall prognosis and agree with Palliative Care consult to help set goals/limits of care  Donetta Potts, MD [facility] [phone]

## 2016-11-09 ENCOUNTER — Inpatient Hospital Stay (HOSPITAL_COMMUNITY): Payer: Medicare HMO

## 2016-11-09 DIAGNOSIS — E870 Hyperosmolality and hypernatremia: Secondary | ICD-10-CM

## 2016-11-09 LAB — BASIC METABOLIC PANEL
Anion gap: 6 (ref 5–15)
BUN: 43 mg/dL — AB (ref 6–20)
CHLORIDE: 121 mmol/L — AB (ref 101–111)
CO2: 23 mmol/L (ref 22–32)
Calcium: 9.1 mg/dL (ref 8.9–10.3)
Creatinine, Ser: 0.71 mg/dL (ref 0.61–1.24)
Glucose, Bld: 162 mg/dL — ABNORMAL HIGH (ref 65–99)
POTASSIUM: 4 mmol/L (ref 3.5–5.1)
SODIUM: 150 mmol/L — AB (ref 135–145)

## 2016-11-09 LAB — GLUCOSE, CAPILLARY
GLUCOSE-CAPILLARY: 146 mg/dL — AB (ref 65–99)
GLUCOSE-CAPILLARY: 137 mg/dL — AB (ref 65–99)
GLUCOSE-CAPILLARY: 164 mg/dL — AB (ref 65–99)
GLUCOSE-CAPILLARY: 207 mg/dL — AB (ref 65–99)
Glucose-Capillary: 204 mg/dL — ABNORMAL HIGH (ref 65–99)
Glucose-Capillary: 207 mg/dL — ABNORMAL HIGH (ref 65–99)

## 2016-11-09 LAB — CBC WITH DIFFERENTIAL/PLATELET
BASOS ABS: 0 10*3/uL (ref 0.0–0.1)
Basophils Relative: 0 %
EOS ABS: 0 10*3/uL (ref 0.0–0.7)
EOS PCT: 0 %
HCT: 26.6 % — ABNORMAL LOW (ref 39.0–52.0)
HEMOGLOBIN: 8.2 g/dL — AB (ref 13.0–17.0)
LYMPHS ABS: 0.6 10*3/uL — AB (ref 0.7–4.0)
LYMPHS PCT: 5 %
MCH: 28.4 pg (ref 26.0–34.0)
MCHC: 30.8 g/dL (ref 30.0–36.0)
MCV: 92 fL (ref 78.0–100.0)
Monocytes Absolute: 0.5 10*3/uL (ref 0.1–1.0)
Monocytes Relative: 5 %
NEUTROS PCT: 90 %
Neutro Abs: 9.3 10*3/uL — ABNORMAL HIGH (ref 1.7–7.7)
PLATELETS: 296 10*3/uL (ref 150–400)
RBC: 2.89 MIL/uL — AB (ref 4.22–5.81)
RDW: 15.7 % — ABNORMAL HIGH (ref 11.5–15.5)
WBC: 10.4 10*3/uL (ref 4.0–10.5)

## 2016-11-09 LAB — MAGNESIUM: MAGNESIUM: 2 mg/dL (ref 1.7–2.4)

## 2016-11-09 LAB — PHOSPHORUS: Phosphorus: 2.7 mg/dL (ref 2.5–4.6)

## 2016-11-09 LAB — AMMONIA: AMMONIA: 168 umol/L — AB (ref 9–35)

## 2016-11-09 LAB — SODIUM: SODIUM: 147 mmol/L — AB (ref 135–145)

## 2016-11-09 MED ORDER — DIPHENHYDRAMINE HCL 25 MG PO CAPS
50.0000 mg | ORAL_CAPSULE | Freq: Once | ORAL | Status: AC
  Administered 2016-11-10: 50 mg via ORAL
  Filled 2016-11-09: qty 2

## 2016-11-09 MED ORDER — AMLODIPINE BESYLATE 5 MG PO TABS
5.0000 mg | ORAL_TABLET | Freq: Every day | ORAL | Status: DC
  Administered 2016-11-09 – 2016-11-10 (×2): 5 mg via ORAL
  Filled 2016-11-09 (×2): qty 1

## 2016-11-09 MED ORDER — HEPARIN SODIUM (PORCINE) 5000 UNIT/ML IJ SOLN
5000.0000 [IU] | Freq: Two times a day (BID) | INTRAMUSCULAR | Status: DC
  Administered 2016-11-10 – 2016-11-12 (×4): 5000 [IU] via SUBCUTANEOUS
  Filled 2016-11-09 (×4): qty 1

## 2016-11-09 MED ORDER — PREDNISONE 50 MG PO TABS
50.0000 mg | ORAL_TABLET | ORAL | Status: DC
  Administered 2016-11-09 – 2016-11-10 (×3): 50 mg via ORAL
  Filled 2016-11-09 (×4): qty 1

## 2016-11-09 MED ORDER — METOPROLOL TARTRATE 25 MG/10 ML ORAL SUSPENSION
100.0000 mg | Freq: Two times a day (BID) | ORAL | Status: DC
  Administered 2016-11-09 – 2016-11-14 (×11): 100 mg
  Filled 2016-11-09 (×11): qty 40

## 2016-11-09 MED ORDER — FREE WATER
280.0000 mL | Status: DC
  Administered 2016-11-09 – 2016-11-10 (×4): 280 mL

## 2016-11-09 NOTE — Progress Notes (Signed)
Patient ID: Roger Gibson, male   DOB: 1945-08-08, 71 y.o.   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 O:BP (!) 169/62 (BP Location: Left Arm) Comment: prn hydralazine given  Pulse 79   Temp 99.2 F (37.3 C) (Axillary)   Resp (!) 29   Ht _0  (1.778 m)   Wt 101 kg (222 lb 10.6 oz)   SpO2 100%   BMI 31.95 kg/m   Intake/Output Summary (Last 24 hours) at 11/09/16 1411 Last data filed at 11/09/16 1200  Gross per 24 hour  Intake          3592.48 ml  Output             2700 ml  Net           892.48 ml   Intake/Output: I/O last 3 completed shifts: In: 6781.4 [I.V.:3016.9; NG/GT:3307; IV Piggyback:457.5] Out: 7026 [Urine:5150; Stool:925]  Intake/Output this shift:  Total I/O In: 1124.9 [I.V.:392.4; NG/GT:580; IV Piggyback:152.5] Out: 850 [Urine:850] Weight change: 0.8 kg (1 lb 12.2 oz) Gen: intubated and unresponsive CVS: no rub Resp:occ rhonchi Abd: +BS Ext: +3 anasarca   Recent Labs Lab 11/03/16 1822 11/04/16 0404 11/04/16 1012 11/04/16 1715 11/05/16 0317 11/07/16 0443 11/09/16 0507  NA 148* 149* 149* 149* 146* 148* 150*  K 4.2 4.1 4.2 4.0 3.8 4.4 4.0  CL 121* 120* 119* 121* 118* 118* 121*  CO2 _1 20* 23  GLUCOSE 362* 310* 285* 289* 301* 175* 162*  BUN 36* 35* 36* 35* 33* 44* 43*  CREATININE 0.96 0.88 0.88 0.84 0.80 1.05 0.71  ALBUMIN  --   --  1.9*  --   --   --   --   CALCIUM 9.4 9.5 9.7 9.5 9.5 9.3 9.1  PHOS  --  1.7*  --   --   --   --  2.7  AST  --   --  68*  --   --   --   --   ALT  --   --  43  --   --   --   --    Liver Function Tests:  Recent Labs Lab 11/04/16 1012  AST 68*  ALT 43  ALKPHOS 62  BILITOT 0.4  PROT 5.5*  ALBUMIN 1.9*   No results for input(s): LIPASE, AMYLASE in the last 168 hours.  Recent Labs Lab 11/07/16 0446 11/09/16 0507  AMMONIA 38* 168*   CBC:  Recent Labs Lab 11/04/16 0404 11/07/16 0443 11/09/16 0507  WBC 6.7 9.2 10.4  NEUTROABS  --   --  9.3*  HGB  8.0* 9.1* 8.2*  HCT 25.5* 27.9* 26.6*  MCV 90.4 90.3 92.0  PLT 259 222 296   Cardiac Enzymes: No results for input(s): CKTOTAL, CKMB, CKMBINDEX, TROPONINI in the last 168 hours. CBG:  Recent Labs Lab 11/08/16 1949 11/08/16 2335 11/09/16 0341 11/09/16 0751 11/09/16 1217  GLUCAP 170* 147* 146* 137* 164*    Iron Studies: No results for input(s): IRON, TIBC, TRANSFERRIN, FERRITIN in the last 72 hours. Studies/Results: Dg Chest Port 1 View  Result Date: 11/09/2016 CLINICAL DATA:  Acute respiratory failure EXAM: PORTABLE CHEST 1 VIEW COMPARISON:  Portable chest x-ray of 11/08/2016 FINDINGS: The tip of the endotracheal tube is approximately 2.9 cm above the carina. The patient is slightly rotated. Aeration of the lung bases has improved with minimal basilar atelectasis remaining. Mediastinal and hilar contours are stable and mild cardiomegaly  is stable. Feeding tube extends below the hemidiaphragm. IMPRESSION: 1. Slightly better aeration with decreasing basilar atelectasis. 2. Endotracheal tube tip 2.9 cm above the carina . Electronically Signed   By: Ivar Drape M.D.   On: 11/09/2016 08:01   Dg Chest Port 1 View  Result Date: 11/08/2016 CLINICAL DATA:  Hypoxia EXAM: PORTABLE CHEST 1 VIEW COMPARISON:  November 05, 2016 FINDINGS: Endotracheal tube tip is 3.2 cm above the carina. Feeding tube tip is below the diaphragm. No pneumothorax. There is focal airspace consolidation in the left base with small left pleural effusion. Lungs elsewhere are clear. Heart is mildly enlarged with pulmonary vascularity within normal limits. There is aortic atherosclerosis. There is a left innominate regions stent no evident bone lesions. No adenopathy evident. IMPRESSION: Tube positions as described without pneumothorax. Patchy consolidation left base with small left pleural effusion. Lungs elsewhere clear. Stable cardiac silhouette. There is aortic atherosclerosis. Aortic Atherosclerosis (ICD10-I70.0).  Electronically Signed   By: Lowella Grip III M.D.   On: 11/08/2016 09:29   . bethanechol  10 mg Oral TID  . chlorhexidine gluconate (MEDLINE KIT)  15 mL Mouth Rinse BID  . Chlorhexidine Gluconate Cloth  6 each Topical Q0600  . cholecalciferol  2,000 Units Oral Daily  . dexamethasone  4 mg Intravenous Q12H  . feeding supplement (PRO-STAT SUGAR FREE 64)  30 mL Per Tube TID  . free water  280 mL Per Tube Q4H  . heparin subcutaneous  5,000 Units Subcutaneous Q12H  . hydrALAZINE  100 mg Oral Q8H  . insulin aspart  0-20 Units Subcutaneous Q4H  . insulin aspart  6 Units Subcutaneous Q4H  . insulin glargine  25 Units Subcutaneous Daily  . lactulose  30 g Oral TID  . mouth rinse  15 mL Mouth Rinse 10 times per day  . metoprolol tartrate  100 mg Per Tube BID  . mycophenolate  250 mg Oral BID  . pantoprazole sodium  40 mg Per Tube Q24H  . pravastatin  20 mg Per Tube q1800  . sodium chloride flush  10-40 mL Intracatheter Q12H  . sodium chloride flush  3 mL Intravenous Q12H  . tacrolimus  3 mg Oral BID   Or  . tacrolimus  1.5 mg Sublingual BID    BMET    Component Value Date/Time   NA 150 (H) 11/09/2016 0507   NA 134 (L) 10/09/2012 1447   K 4.0 11/09/2016 0507   K 5.0 10/11/2012 1044   CL 121 (H) 11/09/2016 0507   CL 94 (L) 10/09/2012 1447   CO2 23 11/09/2016 0507   CO2 33 (H) 10/09/2012 1447   GLUCOSE 162 (H) 11/09/2016 0507   GLUCOSE 78 10/09/2012 1447   BUN 43 (H) 11/09/2016 0507   BUN 33 (H) 10/09/2012 1447   CREATININE 0.71 11/09/2016 0507   CREATININE 9.78 (H) 10/09/2012 1447   CALCIUM 9.1 11/09/2016 0507   CALCIUM 11.0 (H) 10/09/2012 1447   GFRNONAA >60 11/09/2016 0507   GFRNONAA 5 (L) 10/09/2012 1447   GFRAA >60 11/09/2016 0507   GFRAA 6 (L) 10/09/2012 1447   CBC    Component Value Date/Time   WBC 10.4 11/09/2016 0507   RBC 2.89 (L) 11/09/2016 0507   HGB 8.2 (L) 11/09/2016 0507   HGB 9.5 (L) 10/09/2012 1447   HCT 26.6 (L) 11/09/2016 0507   HCT 29.1 (L)  10/09/2012 1447   PLT 296 11/09/2016 0507   PLT 290 10/09/2012 1447   MCV 92.0 11/09/2016 0507  MCV 91 10/09/2012 1447   MCH 28.4 11/09/2016 0507   MCHC 30.8 11/09/2016 0507   RDW 15.7 (H) 11/09/2016 0507   RDW 17.9 (H) 10/09/2012 1447   LYMPHSABS 0.6 (L) 11/09/2016 0507   LYMPHSABS 0.4 (L) 09/22/2012 0551   MONOABS 0.5 11/09/2016 0507   MONOABS 0.2 09/22/2012 0551   EOSABS 0.0 11/09/2016 0507   EOSABS 0.0 09/22/2012 0551   BASOSABS 0.0 11/09/2016 0507   BASOSABS 0.0 09/22/2012 0551     Assessment/Plan:  1. Glioma- s/p craniotomy. 2. S/p Renal transplant (second with baseline Scr 1.5-1.7).  Now on lower dose of prograf and IV mycophenolate.  Awaiting repeat prograf level 3. VDRF- per PCCM 4. Acute encephalopathy with bilateral upper extremity paresis.  Felt to be metabolic as well as hyperammonemia, now on lactulose 5. Hypernatremia- started on free water via ngt 6. RUE DVT- on subcutaneous heparin.  If need central line access would recommend central PIC placement by IR 7. Anasarca- 3rd spacing due to DVT and hypoalbuminemia. 8. Disposition- poor overall prognosis and agree with Palliative Care consult to help set goals/limits of care.  Nothing further to add will sign off.  Please call with questions or concerns.   Donetta Potts, MD [employer] [phone]

## 2016-11-09 NOTE — Progress Notes (Signed)
Plan for central line placement with possible venoplasty if warranted.  Patient has iodinated allergy.  I have ordered premedications on the good chance he will need contrast.  Roger Gibson E 4:44 PM 11/09/2016

## 2016-11-09 NOTE — Progress Notes (Signed)
PULMONARY / CRITICAL CARE MEDICINE   Name: Roger Gibson MRN: [id number] DOB: 10/22/1945    ADMISSION DATE:  09/28/2016 CONSULTATION DATE:  11/08/2016  REFERRING MD: Dr. Wynelle Cleveland, [facility]  CHIEF COMPLAINT:  Lt arm weakness  HISTORY OF PRESENT ILLNESS:   71 yo male with Hx HTN, Anemia, Right renal cell cancer with ESRD s/p transplant, CHF now admitted 9/27 with Lt arm weakness and found to have frontal lobe mass. Had craniotomy 10/05 and remained on vent post op.   SUBJECTIVE:  L femoral line remains in place.  Tolerated PSV with PS10/PEEP5 on 10/14 for 5 hours after which time had increased WOB.   VITAL SIGNS: BP (!) 152/62   Pulse 74   Temp 98.8 F (37.1 C) (Axillary)   Resp (!) 25   Ht 5\' 10"  (1.778 m)   Wt 101 kg (222 lb 10.6 oz)   SpO2 100%   BMI 31.95 kg/m   VENTILATOR SETTINGS: Vent Mode: CPAP;PSV FiO2 (%):  [30 %] 30 % Set Rate:  [18 bmp] 18 bmp Vt Set:  [580 mL] 580 mL PEEP:  [5 cmH20] 5 cmH20 Pressure Support:  [15 cmH20] 15 cmH20 Plateau Pressure:  [17 cmH20-24 cmH20] 24 cmH20  INTAKE / OUTPUT: I/O last 3 completed shifts: In: 6781.4 [I.V.:3016.9; NG/GT:3307; IV Piggyback:457.5] Out: 6075 [Urine:5150; Stool:925]  PHYSICAL EXAMINATION: General: Ill-appearing man, mechanically ventilated Neuro: Opens eyes to voice, will not move his extremities, not following commands HEENT: 7.5 ETT in position with no secretions to suction, scalp staples clean and dry PULM: CTA b/l, no crackles, no wheezing CV:  Regular, no murmur GI: soft, NTND, positive bowel sounds Extremities: 1+ nonpitting Bilateral upper extremity and Trace BLE edema  LABS:  BMET  Recent Labs Lab 11/05/16 0317 11/07/16 0443 11/09/16 0507  NA 146* 148* 150*  K 3.8 4.4 4.0  CL 118* 118* 121*  CO2 22 20* 23  BUN 33* 44* 43*  CREATININE 0.80 1.05 0.71  GLUCOSE 301* 175* 162*    Electrolytes  Recent Labs Lab 11/04/16 0404  11/05/16 0317 11/07/16 0443 11/09/16 0507  CALCIUM 9.5  <  > 9.5 9.3 9.1  MG 1.9  --   --  1.9 2.0  PHOS 1.7*  --   --   --  2.7  < > = values in this interval not displayed.  CBC  Recent Labs Lab 11/04/16 0404 11/07/16 0443 11/09/16 0507  WBC 6.7 9.2 10.4  HGB 8.0* 9.1* 8.2*  HCT 25.5* 27.9* 26.6*  PLT 259 222 296    Coag's No results for input(s): APTT, INR in the last 168 hours.  Sepsis Markers No results for input(s): LATICACIDVEN, PROCALCITON, O2SATVEN in the last 168 hours.  ABG  Recent Labs Lab 11/08/16 0917  PHART 7.433  PCO2ART 32.5  PO2ART 99.4    Liver Enzymes  Recent Labs Lab 11/04/16 1012  AST 68*  ALT 43  ALKPHOS 62  BILITOT 0.4  ALBUMIN 1.9*    Cardiac Enzymes No results for input(s): TROPONINI, PROBNP in the last 168 hours.  Glucose  Recent Labs Lab 11/08/16 1949 11/08/16 2335 11/09/16 0341 11/09/16 0751 11/09/16 1217 11/09/16 1625  GLUCAP 170* 147* 146* 137* 164* 204*    Imaging Dg Chest Port 1 View  Result Date: 11/09/2016 CLINICAL DATA:  Encounter for central line placement, history hypertension, end-stage renal disease EXAM: PORTABLE CHEST 1 VIEW COMPARISON:  Portable exam 1510 hours compared to 0603 hours FINDINGS: Tip of endotracheal tube projects 3.1 cm above carina.  Feeding tube extends into stomach. No central line identified. Stent at a LEFT subclavian vessel question LEFT brachiocephalic vein. Enlargement of cardiac silhouette with vascular congestion. Mild perihilar edema and LEFT basilar atelectasis. No pleural effusion or pneumothorax. Bones unremarkable. IMPRESSION: Tube positions as above. No central line identified. Minimal pulmonary edema with slightly improved aeration at LEFT base since prior study. Electronically Signed   By: Lavonia Dana M.D.   On: 11/09/2016 15:29   Dg Chest Port 1 View  Result Date: 11/09/2016 CLINICAL DATA:  Acute respiratory failure EXAM: PORTABLE CHEST 1 VIEW COMPARISON:  Portable chest x-ray of 11/08/2016 FINDINGS: The tip of the endotracheal  tube is approximately 2.9 cm above the carina. The patient is slightly rotated. Aeration of the lung bases has improved with minimal basilar atelectasis remaining. Mediastinal and hilar contours are stable and mild cardiomegaly is stable. Feeding tube extends below the hemidiaphragm. IMPRESSION: 1. Slightly better aeration with decreasing basilar atelectasis. 2. Endotracheal tube tip 2.9 cm above the carina . Electronically Signed   By: Ivar Drape M.D.   On: 11/09/2016 08:01   STUDIES:  EEG 9/27 >> lateralized periodic discharge Rt posterior quadrant with delta morphology EEG 10/9 >> diffuse encephalopathy, more severe cortical dysfunction of R hemisphere, no seizures   Echo 9/30 >> EF 55 to 60%, mod LVH, grade 1 DD, mild MR  MRI 10/8 >> Post-op changes surrounding biopsy site with residual mass and edema   CULTURES: Blood 10/01 >> negative Brain Bx 10/05 >>Grade III Glioma   ANTIBIOTICS: None  SIGNIFICANT EVENTS: 09/27 Admit 10/05 Craniotomy with biopsy collected 10/8- not waking up, neuro re called  LINES/TUBES: ETT 10/05 >>  Lt femoral CVL 10/05 >> Right IJ single-lumen IV 10/12 >> removed  ASSESSMENT / PLAN: NEURO: 1. Acute encephalopathy with bilateral upper extremity paresis - presumed this acute worsening of mental status to be metabolic in nature. Brain MRI on 10/8 showed no acute changes from prior imaging. EEG on 10/8 was consistent with metabolic encephalopathy. Patient afebrile with no leukocytosis. Has mild hypernatremia but not severe enough to cause mental status changes. Has not been receiving sedation or pain meds or anxiolytics outside of the propofol gtt. When propofol gtt is paused, patient does not wake up any further but only becomes tachypneic.  - ammonia initially elevated now much improved; continuing lactulose.  - no hypercapnea on ABG  2. Frontal lobe mass s/p craniotomy. Bx with Grade III Glioma  - Continue current antiepileptic regimen - Continue  dexamethasone as ordered - Appreciate oncology assistance. Molecular studies ordered and pending. Unclear whether he will ever be a candidate for chemotherapy based on his current degree of debilitation  RESP: 1. Acute Hypoxic Respiratory failure: - CXR today on my review shows LLL infiltrate compared to prior CXR that it clinically more consistent with basilar atelectasis. Also mildly increased perihilar interstitial markings consistent with developing pulmonary edema; no secretions on my suctioning of his ETT. No fever or leukocytosis.  - start Chest PT for the atelectasis - tolerating SBT's well but becomes tachypneic when sedation is held; also mental status is not improved enough for extubation - ultrasound of IVC today showed IVC is completely FLAT at inspiration, consistent with intravascular hypovolemia. Therefore will not give further lasix. Has edema in bilateral arms but may be due to DVT's not volume status.  - may need to discuss trach soon vs transition of goals of care  - has a femoral CVC because he is very difficult access with  multiple failed central lines. Can get PICC due to old grafts in both arms. IV team failed at ultrasound guided PIV's. Attempted RIJ today. RIJ was plump and easily compressible on ultrasound. Good blood return on needle but guidewire could not be passed into the SVC. Concern for possible stenotic lesion. Attempted to place EJ without success. Consult IR for line placement.   RENAL: 1. ESRD, S/p renal transplant: - Continue immmunosuppression with tacrolimus and mycophenolate - Follow BMP, urine output - Avoid nephrotoxins  2. Hypernatremia (continues to worsen) - increase free water flushes from 240cc q4hrs (ie 60cc q1hr) to 280cc q4hrs (ie 70cc q1hr) - recheck sodium this evening; if not improving may consider some gentle D5W at slow rate.   GI: 1. GI bleed 10/03 (resolved) - Continue pantoprazole  2. Hyperammonemia 132 on 10/9 > 38 on 10/14 -  Continue tube feeding per protocol - continue Lactulose  ENDO 1. Hyperglycemia: - Continue Lantus, sliding scale insulin per protocol  CV: 1. Hypertension, HLD. - increase metoprolol from 75mg  to 100mg  PO BID; continue hydralazine 100mg  PO TID - continue nicardipine gtt; wean as tolerated - Labetalol as needed, goal SBP less than 160 - Leave femoral CVC in place for now. Very few other options for IV access. Consulting IR for line placement.   HEME: 1. Anemia (stable) of critical illness, chronic disease, and GI bleed (resolved). - continue to monitor  2. DVT rt brachial vein: - RUE ultrasound showed Rt Brachial DVT on 10/7 - BLE ultrasound showed no BLE DVT on 10/10 - now has swelling of left arm as well, concerning for left-arm DVT. Will obtain LUE Doppler ultrasound. - Subcutaneous heparin acceptable, no full dose heparin until at least 10/19 post glioma resection  SUP - Protonix Nutrition -  Continue tube feeding  Goals of care - full code  Family: reviewed status, overall prognosis, barriers to progress with his wife at bedside on 10/14. We will need to continue discussions, unclear that he will recover from this critical illness. If so he will certainly be recovering to situation that itself carries a poor prognosis, glioma.   Independent CC time 45 minutes  Vernie Murders, MD  11/09/2016, 5:39 PM [facility] [phone] or if no answer [phone]

## 2016-11-10 ENCOUNTER — Encounter (HOSPITAL_COMMUNITY): Payer: Self-pay | Admitting: Interventional Radiology

## 2016-11-10 ENCOUNTER — Inpatient Hospital Stay (HOSPITAL_COMMUNITY): Payer: Medicare HMO

## 2016-11-10 DIAGNOSIS — R609 Edema, unspecified: Secondary | ICD-10-CM

## 2016-11-10 HISTORY — PX: IR US GUIDE VASC ACCESS RIGHT: IMG2390

## 2016-11-10 HISTORY — PX: IR FLUORO GUIDE CV LINE RIGHT: IMG2283

## 2016-11-10 LAB — CBC WITH DIFFERENTIAL/PLATELET
BASOS PCT: 0 %
Basophils Absolute: 0 10*3/uL (ref 0.0–0.1)
EOS PCT: 0 %
Eosinophils Absolute: 0 10*3/uL (ref 0.0–0.7)
HEMATOCRIT: 25.3 % — AB (ref 39.0–52.0)
Hemoglobin: 8 g/dL — ABNORMAL LOW (ref 13.0–17.0)
LYMPHS ABS: 0.4 10*3/uL — AB (ref 0.7–4.0)
Lymphocytes Relative: 4 %
MCH: 29.1 pg (ref 26.0–34.0)
MCHC: 31.6 g/dL (ref 30.0–36.0)
MCV: 92 fL (ref 78.0–100.0)
MONO ABS: 0.3 10*3/uL (ref 0.1–1.0)
MONOS PCT: 3 %
Neutro Abs: 10.3 10*3/uL — ABNORMAL HIGH (ref 1.7–7.7)
Neutrophils Relative %: 93 %
PLATELETS: 260 10*3/uL (ref 150–400)
RBC: 2.75 MIL/uL — ABNORMAL LOW (ref 4.22–5.81)
RDW: 16.5 % — AB (ref 11.5–15.5)
WBC: 11 10*3/uL — ABNORMAL HIGH (ref 4.0–10.5)

## 2016-11-10 LAB — BASIC METABOLIC PANEL
Anion gap: 7 (ref 5–15)
BUN: 48 mg/dL — ABNORMAL HIGH (ref 6–20)
CHLORIDE: 121 mmol/L — AB (ref 101–111)
CO2: 22 mmol/L (ref 22–32)
CREATININE: 0.7 mg/dL (ref 0.61–1.24)
Calcium: 9 mg/dL (ref 8.9–10.3)
GFR calc Af Amer: >60 mL/min (ref >60)
GFR calc non Af Amer: >60 mL/min (ref >60)
Glucose, Bld: 167 mg/dL — ABNORMAL HIGH (ref 65–99)
Potassium: 4.4 mmol/L (ref 3.5–5.1)
Sodium: 150 mmol/L — ABNORMAL HIGH (ref 135–145)

## 2016-11-10 LAB — GLUCOSE, CAPILLARY
Glucose-Capillary: 110 mg/dL — ABNORMAL HIGH (ref 65–99)
Glucose-Capillary: 111 mg/dL — ABNORMAL HIGH (ref 65–99)
Glucose-Capillary: 131 mg/dL — ABNORMAL HIGH (ref 65–99)
Glucose-Capillary: 139 mg/dL — ABNORMAL HIGH (ref 65–99)
Glucose-Capillary: 152 mg/dL — ABNORMAL HIGH (ref 65–99)
Glucose-Capillary: 186 mg/dL — ABNORMAL HIGH (ref 65–99)

## 2016-11-10 LAB — PHOSPHORUS: Phosphorus: 3.6 mg/dL (ref 2.5–4.6)

## 2016-11-10 LAB — SODIUM
SODIUM: 145 mmol/L (ref 135–145)
Sodium: 144 mmol/L (ref 135–145)

## 2016-11-10 LAB — TRIGLYCERIDES: Triglycerides: 100 mg/dL (ref <150)

## 2016-11-10 LAB — PROTIME-INR
INR: 1.03
Prothrombin Time: 13.5 seconds (ref 11.4–15.2)

## 2016-11-10 LAB — MAGNESIUM: Magnesium: 2.3 mg/dL (ref 1.7–2.4)

## 2016-11-10 LAB — AMMONIA: AMMONIA: 26 umol/L (ref 9–35)

## 2016-11-10 MED ORDER — CEFAZOLIN SODIUM-DEXTROSE 2-4 GM/100ML-% IV SOLN
INTRAVENOUS | Status: AC
  Filled 2016-11-10: qty 100

## 2016-11-10 MED ORDER — DEXTROSE 5 % IV SOLN
INTRAVENOUS | Status: DC
  Administered 2016-11-10: 08:00:00 via INTRAVENOUS

## 2016-11-10 MED ORDER — FENTANYL 2500MCG IN NS 250ML (10MCG/ML) PREMIX INFUSION
100.0000 ug/h | INTRAVENOUS | Status: DC
Start: 2016-11-10 — End: 2016-11-14
  Administered 2016-11-10 – 2016-11-12 (×2): 50 ug/h via INTRAVENOUS
  Administered 2016-11-13: 75 ug/h via INTRAVENOUS
  Filled 2016-11-10 (×3): qty 250

## 2016-11-10 MED ORDER — GELATIN ABSORBABLE 12-7 MM EX MISC
CUTANEOUS | Status: AC | PRN
  Administered 2016-11-10: 1 via TOPICAL

## 2016-11-10 MED ORDER — MIDAZOLAM HCL 2 MG/2ML IJ SOLN
INTRAMUSCULAR | Status: AC
  Filled 2016-11-10: qty 2

## 2016-11-10 MED ORDER — IOPAMIDOL (ISOVUE-300) INJECTION 61%
INTRAVENOUS | Status: AC
  Filled 2016-11-10: qty 50

## 2016-11-10 MED ORDER — FREE WATER
400.0000 mL | Status: DC
  Administered 2016-11-10 – 2016-11-12 (×12): 400 mL

## 2016-11-10 MED ORDER — LIDOCAINE HCL (PF) 1 % IJ SOLN
INTRAMUSCULAR | Status: AC | PRN
  Administered 2016-11-10: 5 mL

## 2016-11-10 MED ORDER — GELATIN ABSORBABLE 12-7 MM EX MISC
CUTANEOUS | Status: AC
  Filled 2016-11-10: qty 1

## 2016-11-10 MED ORDER — DEXAMETHASONE SODIUM PHOSPHATE 4 MG/ML IJ SOLN
2.0000 mg | Freq: Two times a day (BID) | INTRAMUSCULAR | Status: DC
  Administered 2016-11-10 – 2016-11-14 (×9): 2 mg via INTRAVENOUS
  Filled 2016-11-10 (×9): qty 1

## 2016-11-10 MED ORDER — LIDOCAINE HCL 1 % IJ SOLN
INTRAMUSCULAR | Status: AC
  Filled 2016-11-10: qty 20

## 2016-11-10 MED ORDER — FENTANYL CITRATE (PF) 100 MCG/2ML IJ SOLN
INTRAMUSCULAR | Status: AC
  Filled 2016-11-10: qty 2

## 2016-11-10 NOTE — Progress Notes (Signed)
No charge note:   Palliative consult received.   Meeting scheduled for 11/11/16 at 0900.  Mariana Kaufman, AGNP-C Palliative Medicine  Please call Palliative Medicine team phone with any questions [phone]. For individual providers please see AMION.

## 2016-11-10 NOTE — Consult Note (Signed)
Chief Complaint: Patient was seen in consultation today for poor venous access  Referring Physician(s):  Dr. Jimmey Ralph  Supervising Physician: Aletta Edouard  Patient Status: [facility] - In-pt  History of Present Illness: Roger Gibson is a 71 y.o. male with past medical history of HTN, CHF, right kidney cancer, and end-stage renal disease who presented to [facility] ED 10/17/2016 with left-sided weakness. He was found to have new onset seizures with persistent postictal left-sided hemiparesis. MRI showed right frontal lobe lesion.  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  He currently has a femoral CVC in place, but medical team requesting additional access.   IR consulted for line placement.  Case reviewed with Dr. Kathlene Cote who notes possible need for venoplasty vs. Angioplasty in order to successfully place line.   Patient has been NPO.  He is on heparin drip.  He has been premedicated for contrast allergy.   Past Medical History:  Diagnosis Date  . Acute focal neurological deficit 09/2016  . Allergy   . Blood transfusion without reported diagnosis   . Cancer ([place])    right kidney ca  . Confusion    during admission to [facility] 2012, persisted after discharge  . ESRD (end stage renal disease) ([place])   . ESRD (end stage renal disease) on dialysis ([place])    T, Th, Sat HD in [place], [place]  . Gout 01/2002   Right knee; Left great toe  . HLD (hyperlipidemia) 1999  . HTN (hypertension) 1974  . Iron deficiency anemia    PRE 2004/06/2000  . Renal cysts, acquired, bilateral    per Darol Destine    Past Surgical History:  Procedure Laterality Date  . [place] OF CRANIAL NAVIGATION N/A 11/22/2016   Procedure: APPLICATION OF CRANIAL  NAVIGATION;  Surgeon: Ditty, Kevan Ny, MD;  Location: [place];  Service: Neurosurgery;  Laterality: N/A;  . COLONOSCOPY  05/15/03   Polyp (biopsy negative) repeat in 5 years  . COLONOSCOPY  07/18/08   1 rectal polyp (Dr. Wynetta Emery) 5 years  . CRANIOTOMY Right 11/04/2016   Procedure: Right frontal craniotomy for mass resection with Brainlab;  Surgeon: Ditty, Kevan Ny, MD;  Location: [place];  Service: Neurosurgery;  Laterality: Right;  right   . CT ABD W & PELVIS WO CM  03/03/06   Right kidney absent; Left with cystic changes  . CYSTOSCOPY  03/03/06   Mild to mod. BPH observed/ otherwise normal  . 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 MR MRA ABDOMEN  2000   3x3 mass left kidney  . NEPHRECTOMY  1965   Left (infection) [place]  . NEPHRECTOMY  2000   partial-(2nd to renal carcinoma) Dr. [place] [facility]  . RADIOLOGY WITH ANESTHESIA N/A 11/07/2016   Procedure: MRI of the Brain;  Surgeon: Greggory Keen, MD;  Location: [place];  Service: Radiology;  Laterality: N/A;  . RADIOLOGY WITH ANESTHESIA N/A 10/25/2016   Procedure: MRI BRAIN;  Surgeon: Radiologist, Medication, MD;  Location: [place];  Service: Radiology;  Laterality: N/A;    Allergies: Iodinated diagnostic agents; Iodine; and Tape  Medications: Prior to Admission medications   Medication Sig Start Date End Date Taking? Authorizing Provider  aspirin EC 81 MG tablet Take 81 mg by mouth daily.   Yes [provider]  Cholecalciferol 2000 units TABS Take 2,000 Units by mouth daily.   Yes [provider]  furosemide (LASIX) 20 MG tablet Take 10 mg by mouth daily.    Yes [provider]  labetalol (NORMODYNE) 100 MG tablet Take 400 mg by mouth 2 (two) times daily.    Yes [provider]  Multiple Vitamin (MULTIVITAMIN WITH MINERALS) TABS tablet Take 1 tablet by mouth daily.   Yes [provider]    mycophenolate (MYFORTIC) 180 MG EC tablet Take 180 mg by mouth 2 (two) times daily.    Yes [provider]  pravastatin (PRAVACHOL) 20 MG tablet Take 20 mg by mouth every evening.   Yes [provider]  predniSONE (DELTASONE) 5 MG tablet Take 5 mg by mouth daily with breakfast.    Yes [provider]  sodium bicarbonate 650 MG tablet Take 650 mg by mouth 2 (two) times daily.   Yes [provider]  tacrolimus (PROGRAF) 1 MG capsule Take 4 mg by mouth 2 (two) times daily.    Yes [provider]  tamsulosin (FLOMAX) 0.4 MG CAPS capsule Take 0.4 mg by mouth daily after supper.   Yes [provider]  omeprazole (PRILOSEC) 20 MG capsule Take 20 mg by mouth daily.    [provider]  sertraline (ZOLOFT) 50 MG tablet Take 50 mg by mouth daily.    [provider]     Family History  Problem Relation Age of Onset  . Stomach cancer Father        ?  Marland Kitchen Hypertension Mother        On dialysis for years  . Hypertension Brother   . Hypertension Brother   . Hypertension Brother   . Hypertension Sister   . Breast cancer Sister        s/p mastectomy  . COPD Sister   . Hypertension Sister   . Kidney failure Sister        on dialysis  . Heart attack Paternal Grandfather   . Colon cancer Neg Hx   . Esophageal cancer Neg Hx   . Rectal cancer Neg Hx     Social History   Social History  . Marital status: Married    Spouse name: N/A  . Number of children: 3  . Years of education: N/A   Occupational History  . Retired-policeman    Social History Main Topics  . Smoking status: Former Smoker    Types: Cigarettes, Cigars    Quit date: 01/25/1998  . Smokeless tobacco: Never Used     Comment: occasional cigar  . Alcohol use No     Comment: very rare  . Drug use: No  . Sexual activity: Not Asked   Other Topics Concern  . None   Social History Narrative   Separated from wife 03/4740      Retired Higher education careers adviser, was in [place]       In [place] since [place] to golf      3 children out of home     Review of Systems  Unable to perform ROS: Acuity of condition    Vital Signs: BP (!) 142/77   Pulse 66   Temp 98.8 F (37.1 C) (Axillary)   Resp (!) 25   Ht 5\' 10"  (1.778 m)   Wt  222 lb 14.2 oz (101.1 kg)   SpO2 99%   BMI 31.98 kg/m   Physical Exam  Constitutional: He appears well-developed.  sedated  Neck:  Fresh puncture to right IJ, scabbed  Cardiovascular: Normal rate and regular rhythm.   Pulmonary/Chest: No respiratory distress.  intubated  Musculoskeletal:  LUE edematous    Imaging: Ct Abdomen Pelvis Wo Contrast  Result Date: 10/22/2016 CLINICAL DATA:  Renal cell cancer, screening for metastasis. Suspected mass the frontal lobe. EXAM: CT CHEST, ABDOMEN AND PELVIS WITHOUT CONTRAST TECHNIQUE: Multidetector CT imaging of the chest, abdomen and pelvis was performed following the standard protocol without IV contrast. COMPARISON:  CT abdomen and pelvis from 12/04/2010. FINDINGS: CT CHEST FINDINGS Cardiovascular: Cardiomegaly without pericardial effusion. There is coronary arteriosclerosis along the circumflex. Mild-to-moderate atherosclerosis of the thoracic aorta without aneurysm. Left subclavian vein stent is noted within the superior mediastinum. Mediastinum/Nodes: No enlarged mediastinal, hilar, or axillary lymph nodes. Thyroid gland, trachea, and esophagus demonstrate no significant findings. Lungs/Pleura: Calcified pleural plaque along the medial aspect of the right hemithorax. Bibasilar dependent and subsegmental atelectasis. No dominant mass. Centrilobular and paraseptal emphysema of the lungs. Musculoskeletal: No aggressive appearing osseous lesions. CT ABDOMEN PELVIS FINDINGS Hepatobiliary: Motion artifacts limit assessment in the upper abdomen. Distended gallbladder containing an air-fluid level within the gallbladder. Trace pneumobilia is also noted in the left hepatic lobe  and common bile duct. This in conjunction with small layering stones within the gallbladder could be secondary to passage of a gallstone or instrumentation of the common bile duct. No wall thickening is identified. No definite mass given limitations of a noncontrast study. Pancreas: Unremarkable. No pancreatic ductal dilatation or surrounding inflammatory changes. Spleen: Normal in size without focal abnormality. Adrenals/Urinary Tract: Status post left nephrectomy. The right kidney is likewise not identified but no surgical clips are seen in the retroperitoneum. Transplanted left pelvic kidney with mild ectasia of the intrarenal collecting system. Contracted bladder with resultant mild mural thickening. Stomach/Bowel: Contracted stomach. Normal small bowel rotation. No small bowel dilatation or obstruction. Normal appendix. Moderate amount of stool is seen within the colon in particular the rectosigmoid. Vascular/Lymphatic: Aortoiliac and branch vessel atherosclerosis. No aneurysm. Mild enlarged abdominal or pelvic lymph nodes. Reproductive: Normal size prostate. Other: .No free air nor free fluid. Nonspecific soft tissue induration and edema involving the left oblique muscles and overlying subcutaneous soft tissues. Musculoskeletal: No aggressive appearing osseous lesions. IMPRESSION: 1. No findings strongly suspicious for metastatic disease. 2. Centrilobular and paraseptal emphysema of the lungs without dominant mass. Mild pleural thickening and calcific plaque along the right lung base is stable. 3. Air-fluid level within the gallbladder with gallstones and mild intrahepatic and extrahepatic pneumobilia. Findings may represent passage of a gallstone or prior biliary instrumentation. 4. Transplanted left kidney with mild ectasia of the intrarenal collecting system. Mild soft tissue edema and induration, nonspecific of the overlying left lower quadrant soft tissues and flank. 5. Surgically absent native kidneys.  Electronically Signed   By: Ashley Royalty M.D.   On: 10/22/2016 23:15   Dg Abd 1 View  Result Date: 09/28/2016 CLINICAL DATA:  Abdominal pain EXAM: ABDOMEN - 1 VIEW COMPARISON:  12/04/2010 CT FINDINGS: Right lung base is clear. Multiple surgical clips in the left upper abdomen. Nonobstructed gas pattern with moderate upper quadrant stool. Clips in the right lower quadrant. Calcified pelvic phleboliths. IMPRESSION: Nonobstructed bowel-gas pattern. Electronically Signed   By: Donavan Foil M.D.   On: 10/18/2016 21:16   Ct Head Wo Contrast  Result  Date: 10/31/2016 CLINICAL DATA:  Altered level of consciousness. EXAM: CT HEAD WITHOUT CONTRAST TECHNIQUE: Contiguous axial images were obtained from the base of the skull through the vertex without intravenous contrast. COMPARISON:  MR brain without contrast 11/21/2016. MRI brain with contrast 11/01/2016. CT head without contrast 10/22/2016. FINDINGS: Brain: Posterior right frontal craniotomy is noted. Gas and fluid are present within the resection cavity in the posterior right frontal lobe. This corresponds to the very of neoplasm. A small amount of extra-axial hemorrhage overlies the resection cavity. Sulcal effacement is slightly improved following surgery. No acute infarct, hemorrhage, or mass lesion is present elsewhere. Ventricles are of normal size. The brainstem and cerebellum are normal. Vascular: Minimal vascular calcifications are present within the cavernous internal carotid arteries. There is no hyperdense vessel. Skull: Right frontal craniotomy is noted. Calvarium is otherwise intact. Sinuses/Orbits: Minimal fluid is present within the anterior left ethmoid air cells. Bilateral maxillary antrostomies are noted. The paranasal sinuses are otherwise clear. Mastoid air cells are clear. The globes and orbits are within normal limits bilaterally. IMPRESSION: 1. Right frontal craniotomy with expected changes in the surgical cavity. 2. Minimal extra-axial  hemorrhage overlying the surgical cavity, within normal limits. 3. Slight reduction in mass effect. 4. No evidence for complication on CT. No acute intracranial abnormality. Electronically Signed   By: San Morelle M.D.   On: 10/31/2016 13:36   Ct Head Wo Contrast  Result Date: 10/22/2016 CLINICAL DATA:  Left-sided weakness.  Focal neuro deficit EXAM: CT HEAD WITHOUT CONTRAST TECHNIQUE: Contiguous axial images were obtained from the base of the skull through the vertex without intravenous contrast. COMPARISON:  MRI and CT 10/05/2016 FINDINGS: Brain: Ill-defined hypodensity in the right frontal cortex over the convexity unchanged from prior CT. There appears to be effacement of sulci in this area. MRI was indeterminate and incomplete. Negative for acute hemorrhage. Generalized atrophy with chronic ischemic change in the white matter. Vascular: Negative for hyperdense vessel Skull: Negative Sinuses/Orbits: Mild mucosal edema paranasal sinuses. Negative orbit Other: None IMPRESSION: Ill-defined hypodensity in the high right frontal lobe unchanged. The MRI was not diagnostic. This could represent edema related to infarct or tumor. There is mild mass-effect and effacement of the sulci. Repeat MRI is suggested when the patient is able to complete the study. If MRI is not possible, CT without and with contrast suggested to evaluate for enhancing mass Atrophy with chronic microvascular ischemia. Electronically Signed   By: Franchot Gallo M.D.   On: 10/22/2016 13:32   Ct Chest Wo Contrast  Result Date: 10/22/2016 CLINICAL DATA:  Renal cell cancer, screening for metastasis. Suspected mass the frontal lobe. EXAM: CT CHEST, ABDOMEN AND PELVIS WITHOUT CONTRAST TECHNIQUE: Multidetector CT imaging of the chest, abdomen and pelvis was performed following the standard protocol without IV contrast. COMPARISON:  CT abdomen and pelvis from 12/04/2010. FINDINGS: CT CHEST FINDINGS Cardiovascular: Cardiomegaly without  pericardial effusion. There is coronary arteriosclerosis along the circumflex. Mild-to-moderate atherosclerosis of the thoracic aorta without aneurysm. Left subclavian vein stent is noted within the superior mediastinum. Mediastinum/Nodes: No enlarged mediastinal, hilar, or axillary lymph nodes. Thyroid gland, trachea, and esophagus demonstrate no significant findings. Lungs/Pleura: Calcified pleural plaque along the medial aspect of the right hemithorax. Bibasilar dependent and subsegmental atelectasis. No dominant mass. Centrilobular and paraseptal emphysema of the lungs. Musculoskeletal: No aggressive appearing osseous lesions. CT ABDOMEN PELVIS FINDINGS Hepatobiliary: Motion artifacts limit assessment in the upper abdomen. Distended gallbladder containing an air-fluid level within the gallbladder. Trace pneumobilia is also noted in  the left hepatic lobe and common bile duct. This in conjunction with small layering stones within the gallbladder could be secondary to passage of a gallstone or instrumentation of the common bile duct. No wall thickening is identified. No definite mass given limitations of a noncontrast study. Pancreas: Unremarkable. No pancreatic ductal dilatation or surrounding inflammatory changes. Spleen: Normal in size without focal abnormality. Adrenals/Urinary Tract: Status post left nephrectomy. The right kidney is likewise not identified but no surgical clips are seen in the retroperitoneum. Transplanted left pelvic kidney with mild ectasia of the intrarenal collecting system. Contracted bladder with resultant mild mural thickening. Stomach/Bowel: Contracted stomach. Normal small bowel rotation. No small bowel dilatation or obstruction. Normal appendix. Moderate amount of stool is seen within the colon in particular the rectosigmoid. Vascular/Lymphatic: Aortoiliac and branch vessel atherosclerosis. No aneurysm. Mild enlarged abdominal or pelvic lymph nodes. Reproductive: Normal size  prostate. Other: .No free air nor free fluid. Nonspecific soft tissue induration and edema involving the left oblique muscles and overlying subcutaneous soft tissues. Musculoskeletal: No aggressive appearing osseous lesions. IMPRESSION: 1. No findings strongly suspicious for metastatic disease. 2. Centrilobular and paraseptal emphysema of the lungs without dominant mass. Mild pleural thickening and calcific plaque along the right lung base is stable. 3. Air-fluid level within the gallbladder with gallstones and mild intrahepatic and extrahepatic pneumobilia. Findings may represent passage of a gallstone or prior biliary instrumentation. 4. Transplanted left kidney with mild ectasia of the intrarenal collecting system. Mild soft tissue edema and induration, nonspecific of the overlying left lower quadrant soft tissues and flank. 5. Surgically absent native kidneys. Electronically Signed   By: Ashley Royalty M.D.   On: 10/22/2016 23:15   Mr Jodene Nam Head Wo Contrast  Result Date: 10/01/2016 CLINICAL DATA:  Seizure like activity, fatigue. EXAM: MRA HEAD WITHOUT CONTRAST TECHNIQUE: Angiographic images of the Circle of Willis were obtained using MRA technique without intravenous contrast. COMPARISON:  MRI of the head October 21, 2016 at 1445 hours FINDINGS: Severely motion degraded examination. ANTERIOR CIRCULATION: Flow related enhancement internal carotid artery's, proximal MCA and possibly ACA vessels. POSTERIOR CIRCULATION: Flow related enhancement within RIGHT and possibly LEFT vertebral artery, basilar artery and proximal posterior cerebral artery's. Source images and MIP images were reviewed. IMPRESSION: Severely motion degraded examination without internal carotid artery or basilar artery occlusion. Nondiagnostic for assessment of emergent large vessel occlusion. Electronically Signed   By: Elon Alas M.D.   On: 10/20/2016 21:02   Mr Brain Wo Contrast  Result Date: 11/23/2016 CLINICAL DATA:  Altered  level of consciousness. EXAM: MRI HEAD WITHOUT CONTRAST TECHNIQUE: Multiplanar, multiecho pulse sequences of the brain and surrounding structures were obtained without intravenous contrast. COMPARISON:  Motion degraded MR brain 10/26/2016. CT head 10/22/2016. MR brain 10/11/2016 FINDINGS: Brain: Redemonstrated is abnormal enlarged RIGHT frontal gyrus, T2 and FLAIR hyperintense, facilitated diffusion, without hemorrhage. No other similar lesions elsewhere. Global atrophy. Vascular: Normal flow voids. Skull and upper cervical spine: Normal marrow signal. Sinuses/Orbits: Negative. Other: None. 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 Contrast not administered because of the patient's dialysis status. Electronically Signed   By: Staci Righter M.D.   On: 11/16/2016 11:27   Mr Brain Wo Contrast  Result Date: 10/26/2016 CLINICAL DATA:  Left-sided weakness EXAM: MRI HEAD WITHOUT CONTRAST TECHNIQUE: Multiplanar, multiecho pulse sequences of the brain and surrounding structures were obtained without intravenous contrast. COMPARISON:  Brain MRI 09/25/2016 and head CT 10/22/2016 FINDINGS: The study is degraded by motion, despite efforts to  reduce this artifact, including utilization of motion-resistant MR sequences. The findings of the study are interpreted in the context of reduced sensitivity/specificity. Brain: The midline structures are normal. There is expansion of the right superior frontal gyrus with associated hyperintense T2 weighted signal. No corresponding diffusion restriction. Brain parenchymal signal is otherwise normal. Moderate diffuse atrophy. The dura is normal and there is no extra-axial collection. Vascular: Limited visualization of flow voids is normal. Skull and upper cervical spine: The visualized skull base, calvarium, upper cervical spine and extracranial soft tissues are normal. Sinuses/Orbits: No fluid levels or advanced mucosal thickening. No  mastoid or middle ear effusion. Normal orbits. IMPRESSION: 1. Markedly motion degraded examination. 2. Expanded, T2 hyperintense right superior frontal gyrus. The appearance is most suggestive of a neoplastic lesion. However, characterization is again poor due to patient motion. A follow-up examination with more adequate sedation (or anesthesia) and the administration of intravenous contrast material (if not contraindicated) would likely provide better characterization. 3. No diffusion restriction or other evidence of acute ischemia. Electronically Signed   By: Ulyses Jarred M.D.   On: 10/26/2016 16:12   Mr Brain Wo Contrast  Result Date: 09/28/2016 CLINICAL DATA:  71 year old male code stroke. Found with abnormal mental status. Small right frontal lobe infarcts suspected on noncontrast head CT today. Outside of treatment window for IV tPA. EXAM: MRI HEAD WITHOUT CONTRAST TECHNIQUE: Multiplanar, multiecho pulse sequences of the brain and surrounding structures were obtained without intravenous contrast. COMPARISON:  Head CT without contrast 1409 hours today. FINDINGS: The examination had to be discontinued prior to completion due to altered mental status. Axial in coronal diffusion weighted imaging plus axial FLAIR imaging only was obtained, and is degraded by motion. Brain: There is abnormal mass like trace diffusion and FLAIR hyperintensity at the right vertex corresponding to the CT abnormality seen today, but this is facilitated on diffusion (series 300, image 44) such as due to vasogenic (non cytotoxic) edema. No significant regional mass effect. No definite abnormal diffusion elsewhere. Stable cerebral morphology compared to the earlier noncontrast head CT. IMPRESSION: 1.  The examination had to be discontinued prior to completion. 2. Abnormal signal at the right superior frontal gyrus corresponding to the CT finding, but compatible with vasogenic edema rather than acute or subacute infarct. Repeat Brain  MRI without and with contrast recommended once the patient can tolerate. Electronically Signed   By: Genevie Ann M.D.   On: 10/24/2016 15:33   Mr Jeri Cos Contrast  Result Date: 11/18/2016 CLINICAL DATA:  71 year old male with right superior frontal gyrus expansile T2 and FLAIR hyperintense lesion. Postcontrast imaging requested for further characterization. The patient is status post nephrectomy for renal cell carcinoma, subsequently on dialysis, subsequent renal transplant. 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 Immunocompromised. EXAM: MRI HEAD WITH CONTRAST TECHNIQUE: Multiplanar, multiecho pulse sequences of the brain and surrounding structures were obtained with intravenous contrast. CONTRAST:  27mL MULTIHANCE GADOBENATE DIMEGLUMINE 529 MG/ML IV SOLN COMPARISON:  Noncontrast MRI 11/04/2016, and earlier. FINDINGS: Following contrast administration there is faint and irregular, nodular enhancement at the T2/FLAIR hyperintense lesion demonstrated yesterday. Two main such foci of enhancement each measure 4-5 mm (series 7, image 18) and appear located along the superficial and medial cortical surfaces of the lesion. See also series 4, image 144 and series 6, image 23. The lesion is in the right superior frontal gyrus about 12-14 mm off of  midline, in the region of the supplemental motor area (SMA). The nearby superior sagittal sinus appears to be enhancing and patent. Lateral to the sinus there appears to be a small arachnoid granulation in proximity to the lesion (small arrow on series 7, image 18). The other regional main cortical draining veins appear to be enhancing and patent. There is no pachymeningeal or defined leptomeningeal enhancement. No other abnormal intracranial enhancement. IMPRESSION: The right superior frontal gyrus, T2/FLAIR hyperintense lesion in the region of the right supplemental  motor area (SMA) demonstrates faint nodular and cortical enhancement following contrast. The regional venous structures including the superior sagittal sinus appear patent. I note that the lesion is nearly subjacent to a small arachnoid granulation. No associated pachymeningeal or convincing leptomeningeal thickening or enhancement. No other abnormal intracranial enhancement. The enhancement pattern is not typical of either metastatic disease or a high-grade glioma. And I feel the main differential considerations in this clinical setting are a focal infectious or inflammatory encephalitis versus a grade 2 or grade 3 glioma. Electronically Signed   By: Genevie Ann M.D.   On: 11/11/2016 15:36   Mr Jeri Cos XT Contrast  Result Date: 11/01/2016 CLINICAL DATA:  Initial evaluation for acute altered mental status status post recent biopsy of right frontal lobe lesion. EXAM: MRI HEAD WITHOUT AND WITH CONTRAST TECHNIQUE: Multiplanar, multiecho pulse sequences of the brain and surrounding structures were obtained without and with intravenous contrast. CONTRAST:  10 cc of MultiHance. COMPARISON:  Prior MRI from 11/09/2016. FINDINGS: Brain: Postoperative changes from recent posterior right frontal craniotomy for biopsy of previously identified posterior right frontal lobe mass. Heterogeneous T2/FLAIR signal intensity with postoperative blood products present within the resection cavity. Mild diffusion abnormality surrounding the resection cavity felt to be within normal limits for normal expected postoperative changes without evidence for significant ischemia or superimposed seizure activity. Smooth contiguous enhancement about the resection cavity felt to be within normal limits for normal expected postoperative changes. Residual T2/FLAIR signal abnormality within the adjacent posterior right frontal lesion consistent with known lesion (series 6, image 22). Associated edema relatively similar to preoperative MRI. Small subdural  collection overlying the right cerebral convexity measures up to 3 mm in thickness without significant mass effect, felt to be within normal limits for normal expected postoperative changes. Overlying postoperative dural thickening and enhancement. Single punctate diffusion abnormality located more anteriorly within the right frontal lobe favored to reflect small amount of postoperative blood products, possibly within the subarachnoid space, although a tiny punctate cortical infarct is not entirely excluded (series 3, image 36). Otherwise, appearance of the brain is stable. Gray-white matter differentiation otherwise maintained. No other evidence for acute or subacute ischemia. No other mass lesion or abnormal enhancement. Trace right-to-left midline shift of the septum pellucidum without significant midline shift. Basilar cisterns remain patent. Stable ventricular size without hydrocephalus. Pituitary gland suprasellar region normal. Midline structures intact and normal. Vascular: Major intracranial vascular flow voids are maintained. Skull and upper cervical spine: Craniocervical junction within normal limits. Multilevel degenerative spondylolysis noted within the upper cervical spine. Resultant mild spinal stenosis at C2-3. Bone marrow signal intensity is heterogeneous. Post craniotomy changes present at the posterior right frontal calvarium. Overlying skin staples. Sinuses/Orbits: Globes and orbital soft tissues within normal limits. Scattered mucosal thickening noted within the paranasal sinuses. Fluid layering within the posterior nasopharynx noted. Patient is intubated. Small bilateral mastoid effusions noted. Other: None. IMPRESSION: 1. Postoperative changes from recent right frontal craniotomy for biopsy/partial resection of posterior right frontal lobe  mass. No complication. Residual mass surrounds the resection cavity with similar localized edema. 2. Small postoperative extra-axial collection overlying  the right cerebral convexity, felt to be within normal limits for normal expected postoperative changes. 3. Punctate diffusion abnormality within the anterior right frontal lobe, suspected to reflect a small amount of postoperative blood products within the subarachnoid space, although a tiny cortical infarct is not excluded. 4. No other acute intracranial abnormality. Electronically Signed   By: Jeannine Boga M.D.   On: 11/01/2016 19:33   US Renal  Result Date: 10/23/2016 CLINICAL DATA:  Acute kidney injury. History of left nephrectomy with left renal transplant. EXAM: RENAL / URINARY TRACT ULTRASOUND COMPLETE COMPARISON:  CT of the abdomen and pelvis on 10/22/2016 FINDINGS: Right Kidney: Length: Not identified. Suspect right native kidney is surgically absent. Technologist reports a large right flank scar. Left Kidney: Length: Surgically absent native left kidney. Left renal transplant: Length:  12.4 cm.  Normal renal echogenicity.  Mild hydronephrosis. Bladder: The bladder is decompressed. Study quality is degraded because the patient is unable to suspend respirations for the exam due to BiPAP. IMPRESSION: 1. Left nephrectomy. 2. Right kidney is not identified and is likely surgically absent. 3. Hydronephrosis of the transplant kidney in the left renal fossa. Electronically Signed   By: Nolon Nations M.D.   On: 10/23/2016 18:30   Dg Chest Port 1 View  Result Date: 11/09/2016 CLINICAL DATA:  Encounter for central line placement, history hypertension, end-stage renal disease EXAM: PORTABLE CHEST 1 VIEW COMPARISON:  Portable exam 1510 hours compared to 0603 hours FINDINGS: Tip of endotracheal tube projects 3.1 cm above carina. Feeding tube extends into stomach. No central line identified. Stent at a LEFT subclavian vessel question LEFT brachiocephalic vein. Enlargement of cardiac silhouette with vascular congestion. Mild perihilar edema and LEFT basilar atelectasis. No pleural effusion or  pneumothorax. Bones unremarkable. IMPRESSION: Tube positions as above. No central line identified. Minimal pulmonary edema with slightly improved aeration at LEFT base since prior study. Electronically Signed   By: Lavonia Dana M.D.   On: 11/09/2016 15:29   Dg Chest Port 1 View  Result Date: 11/09/2016 CLINICAL DATA:  Acute respiratory failure EXAM: PORTABLE CHEST 1 VIEW COMPARISON:  Portable chest x-ray of 11/08/2016 FINDINGS: The tip of the endotracheal tube is approximately 2.9 cm above the carina. The patient is slightly rotated. Aeration of the lung bases has improved with minimal basilar atelectasis remaining. Mediastinal and hilar contours are stable and mild cardiomegaly is stable. Feeding tube extends below the hemidiaphragm. IMPRESSION: 1. Slightly better aeration with decreasing basilar atelectasis. 2. Endotracheal tube tip 2.9 cm above the carina . Electronically Signed   By: Ivar Drape M.D.   On: 11/09/2016 08:01   Dg Chest Port 1 View  Result Date: 11/08/2016 CLINICAL DATA:  Hypoxia EXAM: PORTABLE CHEST 1 VIEW COMPARISON:  November 05, 2016 FINDINGS: Endotracheal tube tip is 3.2 cm above the carina. Feeding tube tip is below the diaphragm. No pneumothorax. There is focal airspace consolidation in the left base with small left pleural effusion. Lungs elsewhere are clear. Heart is mildly enlarged with pulmonary vascularity within normal limits. There is aortic atherosclerosis. There is a left innominate regions stent no evident bone lesions. No adenopathy evident. IMPRESSION: Tube positions as described without pneumothorax. Patchy consolidation left base with small left pleural effusion. Lungs elsewhere clear. Stable cardiac silhouette. There is aortic atherosclerosis. Aortic Atherosclerosis (ICD10-I70.0). Electronically Signed   By: Lowella Grip III M.D.   On: 11/08/2016  09:29   Dg Chest Port 1 View  Result Date: 11/05/2016 CLINICAL DATA:  Encounter for central line EXAM: PORTABLE  CHEST 1 VIEW COMPARISON:  Earlier today FINDINGS: Right neck catheter with tip at or above the thoracic inlet, question EJ placement. No visible pneumothorax. No mediastinal widening. Endotracheal tube tip between the clavicular heads and carina. Feeding tube at least reaches the stomach. There is a left brachiocephalic vascular stent. Mild cardiomegaly. Streaky perihilar opacities that are improved. There is pleural calcification medially in the right chest. Case discussed with ICU nurse. He said he will show it to Dr Titus Mould to be sure catheter is in expected position. IMPRESSION: 1. Right neck catheter with tip at or above the thoracic inlet, question EJ placement. No visible pneumothorax. No mediastinal widening. 2. Improved lung volumes and decreased atelectasis compared earlier today. Electronically Signed   By: Monte Fantasia M.D.   On: 11/05/2016 15:20   Dg Chest Port 1 View  Result Date: 11/05/2016 CLINICAL DATA:  Fall ventilator EXAM: PORTABLE CHEST 1 VIEW COMPARISON:  11/02/2016 FINDINGS: Endotracheal tube is unchanged. Cardiomegaly. Left lower lobe airspace opacity is stable. No confluent opacity on the right. No visible effusions. IMPRESSION: Stable left basilar atelectasis or infiltrate. Electronically Signed   By: Rolm Baptise M.D.   On: 11/05/2016 10:35   Dg Chest Port 1 View  Result Date: 11/02/2016 CLINICAL DATA:  Followup ventilator support. EXAM: PORTABLE CHEST 1 VIEW COMPARISON:  11/01/2016 FINDINGS: Endotracheal tube tip is 2 cm above the carina. Soft feeding tube enters the abdomen. Patchy infiltrate in both lower lobes left more than right is improved. No worsening or new findings. IMPRESSION: Improved lower lobe pneumonia. Electronically Signed   By: Nelson Chimes M.D.   On: 11/02/2016 11:17   Dg Chest Port 1 View  Result Date: 11/01/2016 CLINICAL DATA:  Respiratory failure. EXAM: PORTABLE CHEST 1 VIEW COMPARISON:  10/31/2016 FINDINGS: Endotracheal tube terminates 3 cm above  the carina. Enteric tube courses into the stomach with tip not imaged. A left brachiocephalic vein stent is noted. The cardiomediastinal silhouette is unchanged. Lung volumes are mildly lower than on the prior study with mildly increased opacity in the left lung base. Minimal right basilar opacity is similar to the prior study. A trace left pleural effusion is not excluded. No pneumothorax is identified. IMPRESSION: 1. Low lung volumes with mildly increased left basilar opacity, likely atelectasis. 2. Possible trace left pleural effusion. Electronically Signed   By: Logan Bores M.D.   On: 11/01/2016 07:20   Dg Chest Port 1 View  Result Date: 11/05/2016 CLINICAL DATA:  Intubated EXAM: PORTABLE CHEST 1 VIEW COMPARISON:  10/26/2016 chest radiograph. FINDINGS: Endotracheal tube tip is 3.5 cm above the carina. Enteric tube terminates in the gastric fundus. Stable vascular stent overlying the left upper mediastinum. Stable cardiomediastinal silhouette with top-normal heart size and aortic atherosclerosis. No pneumothorax. No pleural effusion. Stable mild bibasilar scarring versus atelectasis. No pulmonary edema. IMPRESSION: 1. Well-positioned support structures as detailed. 2. Stable mild bibasilar scarring versus atelectasis . Electronically Signed   By: Ilona Sorrel M.D.   On: 11/13/2016 19:18   Dg Chest Port 1 View  Result Date: 10/26/2016 CLINICAL DATA:  Shortness of Breath EXAM: PORTABLE CHEST 1 VIEW COMPARISON:  October 24, 2016 FINDINGS: There is atelectatic change in the left base. There is no appreciable edema or consolidation. There is cardiomegaly with pulmonary vascularity within normal limits. There is aortic atherosclerosis. There is a left subclavian vein stent. No bone  lesions. No evident adenopathy. IMPRESSION: Left base atelectasis. No edema or consolidation. Stable cardiac prominence. There is aortic atherosclerosis. Aortic Atherosclerosis (ICD10-I70.0). Electronically Signed   By: Lowella Grip III M.D.   On: 10/26/2016 07:38   Dg Chest Port 1 View  Result Date: 10/24/2016 CLINICAL DATA:  Shortness of breath. EXAM: PORTABLE CHEST 1 VIEW COMPARISON:  Yesterday. FINDINGS: Stable enlargement of the cardiac silhouette and diffuse prominence of the interstitial markings. Small amount of linear density at the left lung base with improvement. Small amount of left pleural fluid or prominent epicardial fat pad. Stable left subclavian vein stent. Unremarkable bones. IMPRESSION: 1. Decreased left basilar atelectasis. 2. Stable cardiomegaly and chronic interstitial lung disease. Electronically Signed   By: Claudie Revering M.D.   On: 10/24/2016 10:13   Dg Chest Port 1 View  Result Date: 10/23/2016 CLINICAL DATA:  Acute respiratory distress EXAM: PORTABLE CHEST 1 VIEW COMPARISON:  10/04/2016 CXR, chest CT from 10/22/2016 FINDINGS: Stable cardiomegaly with aortic atherosclerosis. Vascular stent projects over the superior mediastinum along the expected location of the left subclavian vein by same day CT. Bibasilar atelectasis is noted left-greater-than-right. Small left effusion. No overt pulmonary edema. No acute nor suspicious osseous abnormality. IMPRESSION: 1. Bibasilar atelectasis left greater than right with small left pleural effusion. No overt pulmonary edema. 2. Stable cardiomegaly with aortic atherosclerosis. Electronically Signed   By: Ashley Royalty M.D.   On: 10/23/2016 01:42   Dg Chest Portable 1 View  Result Date: 10/08/2016 CLINICAL DATA:  Shortness of breath EXAM: PORTABLE CHEST 1 VIEW COMPARISON:  05/14/2014 FINDINGS: Vascular stent over the upper mediastinum. Low lung volumes. Mild cardiomegaly. No infiltrate or effusion. Aortic atherosclerosis. IMPRESSION: Low lung volumes with mild cardiomegaly.  No infiltrate or edema. Electronically Signed   By: Donavan Foil M.D.   On: 10/17/2016 16:35   Dg Abd Portable 1v  Result Date: 10/30/2016 CLINICAL DATA:  Feeding tube placement EXAM:  PORTABLE ABDOMEN - 1 VIEW COMPARISON:  10/26/2016 FINDINGS: Interval placement of a feeding tube with the tip projecting over the antrum of the stomach. There is no bowel dilatation to suggest obstruction. There is no evidence of pneumoperitoneum, portal venous gas or pneumatosis. There are no pathologic calcifications along the expected course of the ureters. The osseous structures are unremarkable. IMPRESSION: Interval placement of a feeding tube with the tip projecting over the antrum of the stomach. Electronically Signed   By: Kathreen Devoid   On: 10/30/2016 17:29   Dg Abd Portable 2v  Result Date: 10/26/2016 CLINICAL DATA:  Abdominal distention and decreased bowel sounds. EXAM: PORTABLE ABDOMEN - 2 VIEW COMPARISON:  10/12/2016 FINDINGS: Scattered gas and stool in the colon. No small or large bowel distention. No radiopaque stones. Visualized bones appear intact. Surgical clips in the left upper quadrant and pelvis. Thoracolumbar scoliosis convex towards the right. Vascular calcifications. Vascular stent noted in the upper mediastinum. IMPRESSION: Nonobstructive bowel gas pattern. Electronically Signed   By: Lucienne Capers M.D.   On: 10/26/2016 01:44   Ct Head Code Stroke Wo Contrast  Result Date: 10/04/2016 CLINICAL DATA:  Code stroke. Altered level of consciousness. History of hypertension, hyperlipidemia, end-stage renal disease on dialysis, RIGHT renal cancer. EXAM: CT HEAD WITHOUT CONTRAST TECHNIQUE: Contiguous axial images were obtained from the base of the skull through the vertex without intravenous contrast. COMPARISON:  None. FINDINGS: BRAIN: Focal blurring of the gray-white matter differentiation RIGHT frontal convexity (axial 29/36) with faint central density. Moderate parenchymal brain volume loss without hydrocephalus.  No midline shift or mass effect. No intraparenchymal hemorrhage. Patchy additional white matter hypodensities. VASCULAR: Mild calcific atherosclerosis of the carotid siphons.  SKULL: No skull fracture. Mild thinning RIGHT frontal calvarium. No destructive bony lesions. 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 No significant scalp soft tissue swelling. SINUSES/ORBITS: Mild paranasal sinus mucosal thickening, chronic maxillary sinusitis without air-fluid levels. Mastoid air cells are well aerated. The included ocular globes and orbital contents are non-suspicious. OTHER: Multiple absent teeth. ASPECTS [facility] Stroke Program Early CT Score) - Ganglionic level infarction (caudate, lentiform nuclei, internal capsule, insula, M1-M3 cortex): 7 - Supraganglionic infarction (M4-M6 cortex): 2 Total score (0-10 with 10 being normal): 9 IMPRESSION: 1. Small RIGHT frontal lobe infarct versus venous hypertension and cortical vein thrombosis versus metastasis. Recommend MRI of the head. Contrast enhanced imaging could be deferred at this time given patient's history of end-stage renal disease on dialysis. 2. ASPECTS is 9. 3. Moderate atrophy and mild chronic small vessel ischemic disease. 4. Critical Value/emergent results were called by telephone at the time of interpretation on 09/25/2016 at 2:14 pm to Dr. Leonel Ramsay, Neurology, who verbally acknowledged these results. Electronically Signed   By: Elon Alas M.D.   On: 10/12/2016 14:17    Labs:  CBC:  Recent Labs  11/04/16 0404 11/07/16 0443 11/09/16 0507 11/10/16 0549  WBC 6.7 9.2 10.4 11.0*  HGB 8.0* 9.1* 8.2* 8.0*  HCT 25.5* 27.9* 26.6* 25.3*  PLT 259 222 296 260    COAGS:  Recent Labs  10/20/2016 1345 11/10/16 0549  INR 1.08 1.03  APTT 31  --     BMP:  Recent Labs  11/05/16 0317 11/07/16 0443 11/09/16 0507 11/09/16 1758 11/10/16 0549  NA 146* 148* 150* 147* 150*  K 3.8 4.4 4.0  --  4.4  CL 118* 118* 121*  --  121*  CO2 22 20* 23  --  22  GLUCOSE 301* 175* 162*  --  167*  BUN 33* 44* 43*  --  48*  CALCIUM 9.5 9.3 9.1   --  9.0  CREATININE 0.80 1.05 0.71  --  0.70  GFRNONAA >60 >60 >60  --  >60  GFRAA >60 >60 >60  --  >60    LIVER FUNCTION TESTS:  Recent Labs  10/25/16 0732 10/26/16 0144 11/18/2016 0516 11/10/2016 0213 10/30/16 0415 10/31/16 0232 11/04/16 1012  BILITOT 0.9 0.8 0.9  --   --   --  0.4  AST 145* 113* 66*  --   --   --  68*  ALT 60 62 52  --   --   --  43  ALKPHOS 69 56 62  --   --   --  62  PROT 7.3 6.9 6.1*  --   --   --  5.5*  ALBUMIN 2.9* 2.8* 2.5* 2.5* 2.1* 2.1* 1.9*    TUMOR MARKERS: No results for input(s): AFPTM, CEA, CA199, CHROMGRNA in the last 8760 hours.  Assessment and Plan: Patient with past medical history of brain lesion, seizures, respiratory failure presents with complaint of poor venous access.  IR consulted for central line placement at the request of Dr. Jimmey Ralph. Case reviewed by Dr. Kathlene Cote who approves patient for procedure.  Patient presents today in their usual state of health.  He has been NPO and is not currently on blood thinners. He has been premedicated for possible use of contrast today.  Wife available by telephone for consent.  Risks and benefits discussed with  the patient including, but not limited to bleeding, infection, vascular injury, pulmonary embolism, need for tunneled HD catheter placement or even death. All of the patient's questions were answered, patient is agreeable to proceed. Consent signed and in chart.   Thank you for this interesting consult.  I greatly enjoyed meeting Roger Gibson and look forward to participating in their care.  A copy of this report was sent to the requesting provider on this date.  Electronically Signed: Docia Barrier, PA 11/10/2016, 8:06 AM   I spent a total of 40 Minutes    in face to face in clinical consultation, greater than 50% of which was counseling/coordinating care for poor venous access.

## 2016-11-10 NOTE — Progress Notes (Addendum)
*  PRELIMINARY RESULTS* Vascular Ultrasound Left upper extremity venous duplex has been completed.  Preliminary findings: the left upper extremity it positive for deep and superficial vein thrombosis. Findings consistent with acute deep vein thrombosis involving one of the brachial veins of the left upper extremity. Findings consistent with subacute deep vein thrombosis involving the left axillary vein. 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   Roger Gibson 11/10/2016, 3:29 PM

## 2016-11-10 NOTE — Progress Notes (Signed)
Upon changing the saturated CVC dressing, the underlying skin had new breakdown from the weeping and dressing adhesive.   This was cleaned and new dressing was applied. Will continue to monitor.

## 2016-11-10 NOTE — Plan of Care (Signed)
Problem: Activity: Goal: Risk for activity intolerance will decrease Outcome: Not Progressing No movement to painful stimuli in bilateral upper and lower extremities.  Problem: Nutrition: Goal: Adequate nutrition will be maintained Outcome: Progressing Tube feedings at goal.

## 2016-11-10 NOTE — Progress Notes (Signed)
RT transported pt to and from 4N27 to IR1 on vent. Pt stable throughout with no complications. VS within normal limits. RT will continue to monitor.

## 2016-11-10 NOTE — Sedation Documentation (Signed)
Propofol titrated to 43mcg/kg/min

## 2016-11-10 NOTE — Progress Notes (Signed)
PULMONARY / CRITICAL CARE MEDICINE   Name: Roger Gibson MRN: [id number] DOB: [birth year]    ADMISSION DATE:  10/17/2016 CONSULTATION DATE:  11/09/2016  REFERRING MD: Dr. Wynelle Cleveland, [facility]  CHIEF COMPLAINT:  Lt arm weakness  HISTORY OF PRESENT ILLNESS:   71 yo male with Hx HTN, Anemia, Right renal cell cancer with ESRD s/p transplant, CHF now admitted 9/27 with Lt arm weakness and found to have frontal lobe mass. Had craniotomy 10/05 and remained on vent post op.   SUBJECTIVE:  L femoral line remains in place.  R- CVC placed this AM but IR 10/14: Tolerated PSV with PS10/PEEP5 for 5 hours after which time had increased WOB.  10/16: tolerated PSV with PS15/PEEP5 x 12hrs without difficulty while on propofol; continued to have tachypnea when propofol weaned.   VITAL SIGNS: BP (!) 147/73   Pulse 66   Temp 97.6 F (36.4 C) (Axillary)   Resp (!) 25   Ht 5\' 10"  (1.778 m)   Wt 101.1 kg (222 lb 14.2 oz)   SpO2 98%   BMI 31.98 kg/m   VENTILATOR SETTINGS: Vent Mode: PRVC FiO2 (%):  [30 %-100 %] 100 % Set Rate:  [18 bmp] 18 bmp Vt Set:  [580 mL] 580 mL PEEP:  [5 cmH20] 5 cmH20 Pressure Support:  [15 cmH20] 15 cmH20 Plateau Pressure:  [16 cmH20-22 cmH20] 16 cmH20  INTAKE / OUTPUT: I/O last 3 completed shifts: In: 5711.3 [I.V.:2438.8; NG/GT:2860; IV Piggyback:412.5] Out: 4425 [Urine:3900; Stool:525]  PHYSICAL EXAMINATION: General: Ill-appearing man, mechanically ventilated Neuro: somnolent, grimaces to sternal rub, not following commands HEENT: 7.5 ETT in position with no secretions to suction, scalp staples clean and dry PULM: CTA b/l, no crackles, no wheezing CV:  Regular, no murmur GI: soft, NTND, positive bowel sounds Extremities: 1+ nonpitting Bilateral upper extremity and Trace BLE edema  LABS:  BMET  Recent Labs Lab 11/07/16 0443 11/09/16 0507 11/09/16 1758 11/10/16 0549  NA 148* 150* 147* 150*  K 4.4 4.0  --  4.4  CL 118* 121*  --  121*  CO2 20* 23  --  22   BUN 44* 43*  --  48*  CREATININE 1.05 0.71  --  0.70  GLUCOSE 175* 162*  --  167*    Electrolytes  Recent Labs Lab 11/04/16 0404  11/07/16 0443 11/09/16 0507 11/10/16 0549  CALCIUM 9.5  < > 9.3 9.1 9.0  MG 1.9  --  1.9 2.0 2.3  PHOS 1.7*  --   --  2.7 3.6  < > = values in this interval not displayed.  CBC  Recent Labs Lab 11/07/16 0443 11/09/16 0507 11/10/16 0549  WBC 9.2 10.4 11.0*  HGB 9.1* 8.2* 8.0*  HCT 27.9* 26.6* 25.3*  PLT 222 296 260    Coag's  Recent Labs Lab 11/10/16 0549  INR 1.03    Sepsis Markers No results for input(s): LATICACIDVEN, PROCALCITON, O2SATVEN in the last 168 hours.  ABG  Recent Labs Lab 11/08/16 0917  PHART 7.433  PCO2ART 32.5  PO2ART 99.4    Liver Enzymes  Recent Labs Lab 11/04/16 1012  AST 68*  ALT 43  ALKPHOS 62  BILITOT 0.4  ALBUMIN 1.9*    Cardiac Enzymes No results for input(s): TROPONINI, PROBNP in the last 168 hours.  Glucose  Recent Labs Lab 11/09/16 1217 11/09/16 1625 11/09/16 2002 11/09/16 2334 11/10/16 0327 11/10/16 0819  GLUCAP 164* 204* 207* 207* 186* 152*    Imaging Ir Fluoro Guide Cv Line Right  Result Date: 11/10/2016 CLINICAL DATA:  Need for central venous access. History of renal transplantation and glioma. Failed bedside attempt at central line placement via right internal jugular vein. History of prior dialysis catheter placement bilaterally. EXAM: NON-TUNNELED CENTRAL VENOUS CATHETER PLACEMENT WITH ULTRASOUND AND FLUOROSCOPIC GUIDANCE FLUOROSCOPY TIME:  18 seconds.  2.0 mGy. PROCEDURE: The procedure, risks, benefits, and alternatives were explained to the patient's wife. Questions regarding the procedure were encouraged and answered. The patient's wife understands and consents to the procedure. Ultrasound was performed of the right neck. The right neck and chest were prepped with chlorhexidine in a sterile fashion, and a sterile drape was applied covering the operative field. Maximum  barrier sterile technique with sterile gowns and gloves were used for the procedure. Local anesthesia was provided with 1% lidocaine. After creating a small venotomy incision, a 21 gauge needle was advanced into a right neck collateral vein under direct, real-time ultrasound guidance. Ultrasound image documentation was performed. After securing guidewire access, a peel-away sheath was placed over a guide wire. Utilizing guide wire measurement, a 5 [place], dual-lumen power injectable non tunneled catheter was cut to 17 cm. The catheter was then placed through the sheath and the sheath removed. Final catheter positioning was confirmed and documented with a fluoroscopic spot image. The catheter was aspirated, flushed with saline, and injected with appropriate volume heparin dwells. The catheter exit site was secured with 0-Prolene retention sutures. COMPLICATIONS: None.  No pneumothorax. FINDINGS: By ultrasound, there appears to be narrowing and or occlusion at the base of the right internal jugular vein where it merges with the right subclavian vein. A large collateral vein is present that drains directly into the medial subclavian vein which appears to represent either a thyrocervical vein or a tortuous external jugular vein. This vein was accessed for catheter placement. After catheter placement, the tip lies at the cavoatrial junction. The catheter aspirates normally and is ready for immediate use. IMPRESSION: Placement of non-tunneled central venous catheter via right neck collateral vein. The catheter tip lies at the cavoatrial junction. The catheter is ready for immediate use. Electronically Signed   By: Aletta Edouard M.D.   On: 11/10/2016 10:13   Ir US Guide Vasc Access Right  Result Date: 11/10/2016 CLINICAL DATA:  Need for central venous access. History of renal transplantation and glioma. Failed bedside attempt at central line placement via right internal jugular vein. History of prior dialysis  catheter placement bilaterally. EXAM: NON-TUNNELED CENTRAL VENOUS CATHETER PLACEMENT WITH ULTRASOUND AND FLUOROSCOPIC GUIDANCE FLUOROSCOPY TIME:  18 seconds.  2.0 mGy. PROCEDURE: The procedure, risks, benefits, and alternatives were explained to the patient's wife. Questions regarding the procedure were encouraged and answered. The patient's wife understands and consents to the procedure. Ultrasound was performed of the right neck. The right neck and chest were prepped with chlorhexidine in a sterile fashion, and a sterile drape was applied covering the operative field. Maximum barrier sterile technique with sterile gowns and gloves were used for the procedure. Local anesthesia was provided with 1% lidocaine. After creating a small venotomy incision, a 21 gauge needle was advanced into a right neck collateral vein under direct, real-time ultrasound guidance. Ultrasound image documentation was performed. After securing guidewire access, a peel-away sheath was placed over a guide wire. Utilizing guide wire measurement, a 5 [place], dual-lumen power injectable non tunneled catheter was cut to 17 cm. The catheter was then placed through the sheath and the sheath removed. Final catheter positioning was confirmed and documented with a fluoroscopic  spot image. The catheter was aspirated, flushed with saline, and injected with appropriate volume heparin dwells. The catheter exit site was secured with 0-Prolene retention sutures. COMPLICATIONS: None.  No pneumothorax. FINDINGS: By ultrasound, there appears to be narrowing and or occlusion at the base of the right internal jugular vein where it merges with the right subclavian vein. A large collateral vein is present that drains directly into the medial subclavian vein which appears to represent either a thyrocervical vein or a tortuous external jugular vein. This vein was accessed for catheter placement. After catheter placement, the tip lies at the cavoatrial junction. The  catheter aspirates normally and is ready for immediate use. IMPRESSION: Placement of non-tunneled central venous catheter via right neck collateral vein. The catheter tip lies at the cavoatrial junction. The catheter is ready for immediate use. Electronically Signed   By: Aletta Edouard M.D.   On: 11/10/2016 10:13   Dg Chest Port 1 View  Result Date: 11/09/2016 CLINICAL DATA:  Encounter for central line placement, history hypertension, end-stage renal disease EXAM: PORTABLE CHEST 1 VIEW COMPARISON:  Portable exam 1510 hours compared to 0603 hours FINDINGS: Tip of endotracheal tube projects 3.1 cm above carina. Feeding tube extends into stomach. No central line identified. Stent at a LEFT subclavian vessel question LEFT brachiocephalic vein. Enlargement of cardiac silhouette with vascular congestion. Mild perihilar edema and LEFT basilar atelectasis. No pleural effusion or pneumothorax. Bones unremarkable. IMPRESSION: Tube positions as above. No central line identified. Minimal pulmonary edema with slightly improved aeration at LEFT base since prior study. Electronically Signed   By: Lavonia Dana M.D.   On: 11/09/2016 15:29   STUDIES:  EEG 9/27 >> lateralized periodic discharge Rt posterior quadrant with delta morphology EEG 10/9 >> diffuse encephalopathy, more severe cortical dysfunction of R hemisphere, no seizures   Echo 9/30 >> EF 55 to 60%, mod LVH, grade 1 DD, mild MR  MRI 10/8 >> Post-op changes surrounding biopsy site with residual mass and edema   CULTURES: Blood 10/01 >> negative Brain Bx 10/05 >>Grade III Glioma   ANTIBIOTICS: None  SIGNIFICANT EVENTS: 09/27 Admit 10/05 Craniotomy with biopsy collected 10/8- not waking up, neuro re called  LINES/TUBES: ETT 10/05 >>  Lt femoral CVL 10/05 >>10/17 R-[place] CVC 10/17 >> Right IJ single-lumen IV 10/12 >> removed  ASSESSMENT / PLAN: NEURO: 1. Acute encephalopathy with bilateral upper extremity paresis - presumed this acute  worsening of mental status to be metabolic in nature. Brain MRI on 10/8 showed no acute changes from prior imaging. EEG on 10/8 was consistent with metabolic encephalopathy. Patient afebrile with no leukocytosis. Has mild hypernatremia but not severe enough to cause mental status changes. Has not been receiving sedation or pain meds or anxiolytics outside of the propofol gtt. When propofol gtt is paused, patient does not wake up any further but only becomes tachypneic.  - ammonia elevated on 10/16 but now normal today at 26; repeat pending. continuing lactulose.  - no hypercapnea on ABG - attempted to discuss poor prognosis with patient's wife and how he will likely not be able to wean from vent and certainly not be at his prior baseline. She stated she does not want to withdraw care and would like trach and peg. She has a meeting with palliative care tomorrow (consulted by Nephrology), but she says she doesn't know what they plan to discuss. Will place consults for trach and peg per wife's wishes.  - patient's wife says she is agreeable to neurology consulting  on this patient regarding his persistent encephalopathy and to discuss prognosis.   2. Frontal lobe mass s/p craniotomy. Bx with Grade III Glioma  - Continue current antiepileptic regimen - Wean dexamethasone from 4mg  to 2mg  IV BID (ok per Neurosurg)  - Appreciate oncology assistance. Molecular studies ordered and pending. Unclear whether he will ever be a candidate for chemotherapy based on his current degree of debilitation  RESP: 1. Acute Hypoxic Respiratory failure: - CXR today on my review shows minimal pulmonary edema and basilar atelectasis.  - continue Chest PT for the atelectasis - tolerating SBT's well but becomes tachypneic when sedation is held; also mental status is not improved enough for extubation. Asked RN to give fentanyl IV PRN as needed during PSV trials but that we want to wean the propofol as tolerated - ultrasound of IVC  on 10/16 showed IVC is completely FLAT at inspiration, consistent with intravascular hypovolemia. Therefore will not give further lasix. Has edema in bilateral arms but may be due to DVT's not volume status.  - see discussion above re: trach/peg and goals of care  - has a femoral CVC because he is very difficult access with multiple failed central lines. R-Banks CVC placed by IR today; will remove Femoral CVC  RENAL: 1. ESRD, S/p renal transplant: - Continue immmunosuppression with tacrolimus and mycophenolate - Follow BMP, urine output - Avoid nephrotoxins  2. Hypernatremia (continues to worsen) - improved somewhat from 150 to 147 by yesterday evening but now up to 150 again this AM - increase free water flushes from 280cc q4hrs to 400cc q4hrs - start D5W at 50cc/hr - recheck Na level today at 4pm.    GI: 1. GI bleed 10/03 (resolved) - Continue pantoprazole  2. Hyperammonemia (resolved as of 10/17) - Continue tube feeding per protocol - continue Lactulose  ENDO 1. Hyperglycemia: - Continue Lantus, sliding scale insulin per protocol  CV: 1. Hypertension, HLD. - continue metoprolol 100mg  PO BID, hydralazine 100mg  PO TID, norvasc 5mg  daily - continue nicardipine gtt; wean as tolerated - Labetalol as needed, goal SBP less than 160  HEME: 1. Anemia (stable) of critical illness, chronic disease, and GI bleed (resolved). - continue to monitor  2. DVT rt brachial vein: - RUE ultrasound showed Rt Brachial DVT on 10/7 - BLE ultrasound showed no BLE DVT on 10/10 - now has swelling of left arm as well, concerning for left-arm DVT. Ordered LUE Doppler ultrasound on 10/16; awaiting test to be performed.. - Subcutaneous heparin acceptable, no full dose heparin until at least 10/19 post glioma resection  SUP - Protonix Nutrition -  Continue tube feeding  Goals of care - full code   Independent CC time 45 minutes  Vernie Murders, MD  11/10/2016, 11:23 AM [facility] [phone] or if no answer [phone]

## 2016-11-10 NOTE — Progress Notes (Signed)
Na correcting a little too quickly now 144 (at 4:55pm) down from 150. Decreased D5W from 50cc/hr to 20cc/hr. Repeat Na at 11pm

## 2016-11-10 NOTE — Sedation Documentation (Signed)
Propofol titrated to 45mcg/kg/min

## 2016-11-10 NOTE — Procedures (Signed)
Interventional Radiology Procedure Note  Procedure: Non-tunneled CVC placement  Complications: None  Estimated Blood Loss: < 10 mL  17 cm DL Power line placed via right neck collateral vein emptying into subclavian vein.  Tip at SVC/RA junction.  OK to use.  Venetia Night. Kathlene Cote, M.D Pager:  [phone]

## 2016-11-11 ENCOUNTER — Inpatient Hospital Stay (HOSPITAL_COMMUNITY): Payer: Medicare HMO

## 2016-11-11 DIAGNOSIS — Z9911 Dependence on respirator [ventilator] status: Secondary | ICD-10-CM

## 2016-11-11 DIAGNOSIS — Z7189 Other specified counseling: Secondary | ICD-10-CM

## 2016-11-11 DIAGNOSIS — Z515 Encounter for palliative care: Secondary | ICD-10-CM

## 2016-11-11 DIAGNOSIS — J9620 Acute and chronic respiratory failure, unspecified whether with hypoxia or hypercapnia: Secondary | ICD-10-CM

## 2016-11-11 DIAGNOSIS — J9690 Respiratory failure, unspecified, unspecified whether with hypoxia or hypercapnia: Secondary | ICD-10-CM

## 2016-11-11 DIAGNOSIS — G9389 Other specified disorders of brain: Secondary | ICD-10-CM

## 2016-11-11 LAB — CBC WITH DIFFERENTIAL/PLATELET
BASOS ABS: 0 10*3/uL (ref 0.0–0.1)
Basophils Relative: 0 %
Eosinophils Absolute: 0 10*3/uL (ref 0.0–0.7)
Eosinophils Relative: 0 %
HEMATOCRIT: 27.3 % — AB (ref 39.0–52.0)
Hemoglobin: 8.4 g/dL — ABNORMAL LOW (ref 13.0–17.0)
LYMPHS PCT: 3 %
Lymphs Abs: 0.5 10*3/uL — ABNORMAL LOW (ref 0.7–4.0)
MCH: 29 pg (ref 26.0–34.0)
MCHC: 30.8 g/dL (ref 30.0–36.0)
MCV: 94.1 fL (ref 78.0–100.0)
MONO ABS: 0.4 10*3/uL (ref 0.1–1.0)
MONOS PCT: 3 %
NEUTROS ABS: 13.1 10*3/uL — AB (ref 1.7–7.7)
Neutrophils Relative %: 94 %
Platelets: 263 10*3/uL (ref 150–400)
RBC: 2.9 MIL/uL — ABNORMAL LOW (ref 4.22–5.81)
RDW: 16.9 % — ABNORMAL HIGH (ref 11.5–15.5)
WBC: 13.9 10*3/uL — ABNORMAL HIGH (ref 4.0–10.5)

## 2016-11-11 LAB — MAGNESIUM: Magnesium: 2.2 mg/dL (ref 1.7–2.4)

## 2016-11-11 LAB — BASIC METABOLIC PANEL
ANION GAP: 8 (ref 5–15)
BUN: 64 mg/dL — ABNORMAL HIGH (ref 6–20)
CHLORIDE: 114 mmol/L — AB (ref 101–111)
CO2: 21 mmol/L — AB (ref 22–32)
Calcium: 8.7 mg/dL — ABNORMAL LOW (ref 8.9–10.3)
Creatinine, Ser: 1.19 mg/dL (ref 0.61–1.24)
GFR calc Af Amer: >60 mL/min (ref >60)
GFR calc non Af Amer: 59 mL/min — ABNORMAL LOW (ref >60)
GLUCOSE: 108 mg/dL — AB (ref 65–99)
POTASSIUM: 4.8 mmol/L (ref 3.5–5.1)
Sodium: 143 mmol/L (ref 135–145)

## 2016-11-11 LAB — GLUCOSE, CAPILLARY
GLUCOSE-CAPILLARY: 107 mg/dL — AB (ref 65–99)
GLUCOSE-CAPILLARY: 67 mg/dL (ref 65–99)
Glucose-Capillary: 101 mg/dL — ABNORMAL HIGH (ref 65–99)
Glucose-Capillary: 106 mg/dL — ABNORMAL HIGH (ref 65–99)
Glucose-Capillary: 117 mg/dL — ABNORMAL HIGH (ref 65–99)
Glucose-Capillary: 132 mg/dL — ABNORMAL HIGH (ref 65–99)

## 2016-11-11 LAB — PHOSPHORUS: PHOSPHORUS: 5 mg/dL — AB (ref 2.5–4.6)

## 2016-11-11 MED ORDER — AMLODIPINE BESYLATE 5 MG PO TABS
5.0000 mg | ORAL_TABLET | Freq: Two times a day (BID) | ORAL | Status: DC
  Administered 2016-11-11 – 2016-11-14 (×7): 5 mg via ORAL
  Filled 2016-11-11 (×7): qty 1

## 2016-11-11 MED ORDER — SODIUM CHLORIDE 0.45 % IV SOLN
INTRAVENOUS | Status: DC
  Administered 2016-11-11: 10:00:00 via INTRAVENOUS

## 2016-11-11 MED ORDER — DEXTROSE 50 % IV SOLN
INTRAVENOUS | Status: AC
  Filled 2016-11-11: qty 50

## 2016-11-11 MED ORDER — DEXTROSE 50 % IV SOLN
25.0000 mL | Freq: Once | INTRAVENOUS | Status: AC
  Administered 2016-11-11: 25 mL via INTRAVENOUS

## 2016-11-11 NOTE — Consult Note (Signed)
Consultation Note Date: 11/11/2016   Patient Name: Roger Gibson  DOB: 1945/09/10  MRN: [id number]  Age / Sex: 71 y.o., male  PCP: Roger Low, MD Referring Physician: Raylene Miyamoto, MD  Reason for Consultation: Establishing goals of care  HPI/Patient Profile: 71 y.o. male  with past medical history of renal cell carcinoma, s/p two kidney transplants, hypertension, CHF, end stage renal disease, admitted on 10/02/2016 after his wife found him in the bedroom unable to stand, with crusted blood in his mouth and tongue. Workup revealed R frontal mass and post ictal state. Patient decompensated during admission requiring bipap 9/29. Biopsy via craniotomy was completed on 11/01/2016. Patient has remained unresponsive and intubated with poor weaning since 10/5. Admission has been complicated by hypernatremia, multiple DVT's, sepsis. Patient has not made progress. Palliative medicine consulted for [place].   Clinical Assessment and Goals of Care:  I have reviewed medical records including EPIC notes, labs and imaging, received report from patients nurse, assessed the patient and then met at the bedside along with patient's spouse and niece  to discuss diagnosis prognosis, GOC, EOL wishes, disposition and options.  I introduced Palliative Medicine as specialized medical care for people living with serious illness. It focuses on providing relief from the symptoms and stress of a serious illness. The goal is to improve quality of life for both the patient and the family.  We discussed a brief life review of the patient. 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 He enjoyed being independent and taking care of others.   As far as functional and nutritional status prior to admission his wife notes he had been sleeping more and  more. He was still driving and going out to dinner, but was no longer playing golf as he had been. He had not stopped eating. He enjoyed [employer] cakes.    We discussed their current illness and what it means in the larger context of their on-going co-morbidities.  Natural disease trajectory and expectations at EOL were discussed. We discussed patient's poor prognosis for any meaningful functional recovery. 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  I attempted to elicit values and goals of care important to the patient. Patient's spouse produced a living will that indicated patient would not want to be kept alive via artificial means in the event that he was unconscious and not going to recover.   The difference between aggressive medical intervention and comfort care was considered in light of the patient's goals of care. [name] was able to verbalize that patient would not want to live his life in this state. They agreed that a tracheostomy and PEG tube would not align with patient's goals of care.   Advanced directives, concepts specific to code status, artifical feeding and  hydration, and rehospitalization were considered and discussed. Patient's spouse noted that patient would want DNR status.   [name] and niece requested an additional day to process information and discuss with family members.   Questions and concerns were addressed.  Hard Choices booklet left for review. The family was encouraged to call with questions or concerns.    Primary Decision Maker NEXT OF KIN - Patient's spouse- Roger Gibson    SUMMARY OF RECOMMENDATIONS -DNR -No trach, no PEG -PMT will followup with patient's spouse tomorrow morning to discuss compassionate wean from vent  Code Status/Advance Care Planning:  DNR   Palliative Prophylaxis:   Frequent Pain  Assessment  Additional Recommendations (Limitations, Scope, Preferences):  No Tracheostomy  Prognosis:    Unable to determine likely hours to days once decision   Discharge Planning: Anticipated Hospital Death  Primary Diagnoses: Present on Admission: . Immunosuppressed status ([place]) . AKI (acute kidney injury) ([place]) . CKD (chronic kidney disease), stage III ([place]) . Hypokalemia . Elevated troponin . Vasogenic brain edema ([place]) . Acute encephalopathy . Acute focal neurological deficit . Chronic combined systolic and diastolic CHF (congestive heart failure) ([place])   I have reviewed the medical record, interviewed the patient and family, and examined the patient. The following aspects are pertinent.  Past Medical History:  Diagnosis Date  . Acute focal neurological deficit 09/2016  . Allergy   . Blood transfusion without reported diagnosis   . Cancer ([place])    right kidney ca  . Confusion    during admission to [facility] 2012, persisted after discharge  . ESRD (end stage renal disease) ([place])   . ESRD (end stage renal disease) on dialysis ([place])    T, Th, Sat HD in [place], [place]  . Gout 01/2002   Right knee; Left great toe  . HLD (hyperlipidemia) 1999  . HTN (hypertension) 1974  . Iron deficiency anemia    PRE 2004/06/2000  . Renal cysts, acquired, bilateral    per [facility]   Social History   Social History  . Marital status: Married    Spouse name: N/A  . Number of children: 3  . Years of education: N/A   Occupational History  . Retired-policeman    Social History Main Topics  . Smoking status: Former Smoker    Types: Cigarettes, Cigars    Quit date: 01/25/1998  . Smokeless tobacco: Never Used     Comment: occasional cigar  . Alcohol use No     Comment: very rare  . Drug use: No  . Sexual activity: Not Asked   Other Topics Concern  . None   Social History Narrative   Separated from wife 12/2006      Retired  Higher education careers adviser, was in [place]      In [place] since [place] to golf      3 children out of home   Family History  Problem Relation Age of Onset  . Stomach cancer Father        ?  Marland Kitchen Hypertension Mother        On dialysis for years  . Hypertension Brother   . Hypertension Brother   . Hypertension Brother   . Hypertension Sister   . Breast cancer Sister        s/p mastectomy  . COPD Sister   . Hypertension Sister   . Kidney failure Sister  on dialysis  . Heart attack Paternal Grandfather   . Colon cancer Neg Hx   . Esophageal cancer Neg Hx   . Rectal cancer Neg Hx    Scheduled Meds: . amLODipine  5 mg Oral BID  . bethanechol  10 mg Oral TID  . chlorhexidine gluconate (MEDLINE KIT)  15 mL Mouth Rinse BID  . Chlorhexidine Gluconate Cloth  6 each Topical Q0600  . cholecalciferol  2,000 Units Oral Daily  . dexamethasone  2 mg Intravenous Q12H  . feeding supplement (PRO-STAT SUGAR FREE 64)  30 mL Per Tube TID  . free water  400 mL Per Tube Q4H  . heparin subcutaneous  5,000 Units Subcutaneous Q12H  . hydrALAZINE  100 mg Oral Q8H  . insulin aspart  0-20 Units Subcutaneous Q4H  . insulin aspart  6 Units Subcutaneous Q4H  . insulin glargine  25 Units Subcutaneous Daily  . lactulose  30 g Oral TID  . mouth rinse  15 mL Mouth Rinse 10 times per day  . metoprolol tartrate  100 mg Per Tube BID  . mycophenolate  250 mg Oral BID  . pantoprazole sodium  40 mg Per Tube Q24H  . pravastatin  20 mg Per Tube q1800  . predniSONE  50 mg Oral Irregular times on Tue Wed  . sodium chloride flush  10-40 mL Intracatheter Q12H  . sodium chloride flush  3 mL Intravenous Q12H  . tacrolimus  3 mg Oral BID   Or  . tacrolimus  1.5 mg Sublingual BID   Continuous Infusions: . sodium chloride 75 mL/hr at 11/11/16 1000  . feeding supplement (GLUCERNA 1.2 CAL) 1,000 mL (11/11/16 1000)  . fentaNYL infusion INTRAVENOUS Stopped (11/11/16 0800)  . lacosamide (VIMPAT) IV 200 mg  (11/11/16 0957)  . levETIRAcetam Stopped (11/11/16 0943)  . niCARDipine Stopped (11/10/16 1420)   PRN Meds:.acetaminophen **OR** acetaminophen, albuterol, bisacodyl, camphor-menthol, docusate, fentaNYL (SUBLIMAZE) injection, fentaNYL (SUBLIMAZE) injection, hydrALAZINE, labetalol, levalbuterol, naLOXone (NARCAN)  injection, [DISCONTINUED] ondansetron **OR** ondansetron (ZOFRAN) IV, promethazine, sodium chloride flush Medications Prior to Admission:  Prior to Admission medications   Medication Sig Start Date End Date Taking? Authorizing Provider  aspirin EC 81 MG tablet Take 81 mg by mouth daily.   Yes [provider]  Cholecalciferol 2000 units TABS Take 2,000 Units by mouth daily.   Yes [provider]  furosemide (LASIX) 20 MG tablet Take 10 mg by mouth daily.    Yes [provider]  labetalol (NORMODYNE) 100 MG tablet Take 400 mg by mouth 2 (two) times daily.    Yes [provider]  Multiple Vitamin (MULTIVITAMIN WITH MINERALS) TABS tablet Take 1 tablet by mouth daily.   Yes [provider]  mycophenolate (MYFORTIC) 180 MG EC tablet Take 180 mg by mouth 2 (two) times daily.    Yes [provider]  pravastatin (PRAVACHOL) 20 MG tablet Take 20 mg by mouth every evening.   Yes [provider]  predniSONE (DELTASONE) 5 MG tablet Take 5 mg by mouth daily with breakfast.    Yes [provider]  sodium bicarbonate 650 MG tablet Take 650 mg by mouth 2 (two) times daily.   Yes [provider]  tacrolimus (PROGRAF) 1 MG capsule Take 4 mg by mouth 2 (two) times daily.    Yes [provider]  tamsulosin (FLOMAX) 0.4 MG CAPS capsule Take 0.4 mg by mouth daily after supper.   Yes [provider]  omeprazole (PRILOSEC) 20 MG capsule Take  20 mg by mouth daily.    [provider]  sertraline (ZOLOFT) 50 MG tablet Take 50 mg by mouth daily.    [provider]   Allergies  Allergen Reactions  .  Iodinated Diagnostic Agents Hives and Swelling  . Iodine Hives and Swelling  . Tape Rash    Adhesive tape    Review of Systems  Unable to perform ROS: Intubated    Physical Exam  Constitutional: He appears well-developed and well-nourished.  HENT:  Craniotomy incision dry, without erythema  Cardiovascular: Normal rate and regular rhythm.   Pulmonary/Chest: Breath sounds normal.  intubated  Abdominal: Soft.  Musculoskeletal:  Diffuse anasarca  Neurological:  Opens eyes, does not track, does not follow commands  Skin: Skin is warm.  Nursing note and vitals reviewed.   Vital Signs: BP (!) 166/75   Pulse 65   Temp 98.6 F (37 C) (Axillary)   Resp (!) 21   Ht 5' 10"  (1.778 m)   Wt 102.7 kg (226 lb 6.6 oz)   SpO2 100%   BMI 32.49 kg/m  Pain Assessment: CPOT POSS *See Group Information*: 2-Acceptable,Slightly drowsy, easily aroused Pain Score: Asleep   SpO2: SpO2: 100 % O2 Device:SpO2: 100 % O2 Flow Rate: .O2 Flow Rate (L/min): 2 L/min  IO: Intake/output summary:  Intake/Output Summary (Last 24 hours) at 11/11/16 1007 Last data filed at 11/11/16 1000  Gross per 24 hour  Intake          4620.14 ml  Output             2425 ml  Net          2195.14 ml    LBM: Last BM Date: 11/11/16 Baseline Weight: Weight: 90 kg (198 lb 6.6 oz) Most recent weight: Weight: 102.7 kg (226 lb 6.6 oz)     Palliative Assessment/Data: PPS: 10%     Thank you for this consult. Palliative medicine will continue to follow and assist as needed.   Time In: 0845 Time Out: 1040 Time Total: 115 mins Prolonged services billed: yes Greater than 50%  of this time was spent counseling and coordinating care related to the above assessment and plan.  Signed by: Mariana Kaufman, AGNP-C Palliative Medicine    Please contact Palliative Medicine Team phone at [phone] for questions and concerns.  For individual provider: See Shea Evans

## 2016-11-11 NOTE — Progress Notes (Signed)
On arrival patient was weaning. No complications with weaning. Pt was pulling good volumes and minute ventilation was acceptable. Pt is now on FS at this time tolerating it well.

## 2016-11-11 NOTE — Progress Notes (Signed)
CPT done patient tolerated it well

## 2016-11-11 NOTE — Progress Notes (Signed)
PULMONARY / CRITICAL CARE MEDICINE   Name: Roger Gibson MRN: [id number] DOB: [birth year]    ADMISSION DATE:  09/30/2016 CONSULTATION DATE:  11/07/2016  REFERRING MD: Dr. Wynelle Cleveland, [facility]  CHIEF COMPLAINT:  Lt arm weakness  HISTORY OF PRESENT ILLNESS:   71 yo male with Hx HTN, Anemia, Right renal cell cancer with ESRD s/p transplant, CHF now admitted 9/27 with Lt arm weakness and found to have frontal lobe mass. Had craniotomy 10/05 and remained on vent post op.   SUBJECTIVE:  L femoral line remains in place.  R-[place] CVC placed this AM but IR 10/14: Tolerated PSV with PS10/PEEP5 for 5 hours after which time had increased WOB.  10/16: tolerated PSV with PS15/PEEP5 x 12hrs without difficulty while on propofol; continued to have tachypnea when propofol weaned.  10/17: found to have DVT in left arm as well; already had one in right arm. Weaned off propofol and onto fentanyl gtt. 10/18: fentanyl gtt stopped. Still with minimal response.   VITAL SIGNS: BP (!) 165/96   Pulse 68   Temp 98.8 F (37.1 C) (Oral)   Resp (!) 24   Ht 5' 10"  (1.778 m)   Wt 102.7 kg (226 lb 6.6 oz)   SpO2 99%   BMI 32.49 kg/m   VENTILATOR SETTINGS: Vent Mode: CPAP;PSV FiO2 (%):  [30 %] 30 % Set Rate:  [18 bmp] 18 bmp Vt Set:  [580 mL] 580 mL PEEP:  [2 cmH20-5 cmH20] 2 cmH20 Pressure Support:  [5 cmH20-8 cmH20] 5 cmH20 Plateau Pressure:  [16 cmH20-18 cmH20] 17 cmH20  INTAKE / OUTPUT: I/O last 3 completed shifts: In: 5101.1 [I.V.:1808.6; NG/GT:2835; IV Piggyback:457.5] Out: 4250 [Urine:3575; Stool:675]  PHYSICAL EXAMINATION: General: Ill-appearing man, mechanically ventilated Neuro: somnolent, intermittently opens eyes to sternal rub but does not focus or track, not following commands HEENT: 7.5 ETT in position with no secretions to suction, scalp staples clean and dry PULM: CTA b/l, no crackles, no wheezing CV:  Regular, no murmur GI: soft, NTND, positive bowel sounds Extremities: 2+ nonpitting  Bilateral upper extremity and Trace BLE edema  LABS:  BMET  Recent Labs Lab 11/09/16 0507  11/10/16 0549 11/10/16 1655 11/10/16 2250 11/11/16 0412  NA 150*  < > 150* 144 145 143  K 4.0  --  4.4  --   --  4.8  CL 121*  --  121*  --   --  114*  CO2 23  --  22  --   --  21*  BUN 43*  --  48*  --   --  64*  CREATININE 0.71  --  0.70  --   --  1.19  GLUCOSE 162*  --  167*  --   --  108*  < > = values in this interval not displayed.  Electrolytes  Recent Labs Lab 11/09/16 0507 11/10/16 0549 11/11/16 0412  CALCIUM 9.1 9.0 8.7*  MG 2.0 2.3 2.2  PHOS 2.7 3.6 5.0*    CBC  Recent Labs Lab 11/09/16 0507 11/10/16 0549 11/11/16 0412  WBC 10.4 11.0* 13.9*  HGB 8.2* 8.0* 8.4*  HCT 26.6* 25.3* 27.3*  PLT 296 260 263    Coag's  Recent Labs Lab 11/10/16 0549  INR 1.03    Sepsis Markers No results for input(s): LATICACIDVEN, PROCALCITON, O2SATVEN in the last 168 hours.  ABG  Recent Labs Lab 11/08/16 0917  PHART 7.433  PCO2ART 32.5  PO2ART 99.4    Liver Enzymes No results for input(s): AST, ALT, ALKPHOS,  BILITOT, ALBUMIN in the last 168 hours.  Cardiac Enzymes No results for input(s): TROPONINI, PROBNP in the last 168 hours.  Glucose  Recent Labs Lab 11/10/16 2002 11/10/16 2334 11/11/16 0340 11/11/16 0829 11/11/16 1137 11/11/16 1623  GLUCAP 111* 110* 101* 106* 117* 132*    Imaging Dg Chest Port 1 View  Result Date: 11/11/2016 CLINICAL DATA:  Acute respiratory failure with hypoxia EXAM: PORTABLE CHEST 1 VIEW COMPARISON:  11/09/2016 FINDINGS: Endotracheal tube tip 2 cm above the carina. A feeding tube that at least reaches the stomach. Central line with tip at the right atrium. Low volume chest with streaky opacities and interstitial coarsening. There is a left brachiocephalic vein stent. Trace left effusion. No pneumothorax. IMPRESSION: 1. Low volume chest with atelectasis or pneumonia. 2. Endotracheal tube tip 2 cm above the carina. Electronically  Signed   By: Monte Fantasia M.D.   On: 11/11/2016 09:50   STUDIES:  EEG 9/27 >> lateralized periodic discharge Rt posterior quadrant with delta morphology EEG 10/9 >> diffuse encephalopathy, more severe cortical dysfunction of R hemisphere, no seizures   Echo 9/30 >> EF 55 to 60%, mod LVH, grade 1 DD, mild MR  MRI 10/8 >> Post-op changes surrounding biopsy site with residual mass and edema   CULTURES: Blood 10/01 >> negative Brain Bx 10/05 >>Grade III Glioma   ANTIBIOTICS: None  SIGNIFICANT EVENTS: 09/27 Admit 10/05 Craniotomy with biopsy collected 10/8- not waking up, neuro re called  LINES/TUBES: ETT 10/05 >>  Lt femoral CVL 10/05 >>10/17 R-[place] CVC 10/17 >> Right IJ single-lumen IV 10/12 >> removed  ASSESSMENT / PLAN: NEURO: 1. Acute encephalopathy with bilateral upper extremity paresis - presumed this acute worsening of mental status to be metabolic in nature. Brain MRI on 10/8 showed no acute changes from prior imaging. EEG on 10/8 was consistent with metabolic encephalopathy. Patient afebrile with no leukocytosis. Had mild hypernatremia (resolved now) but not severe enough to cause mental status changes. When propofol gtt is paused, patient does not wake up any further but only becomes tachypneic. Now off all sedation and not waking up.  - ammonia elevated on 10/16 but now normal since then. continuing lactulose.  - no hypercapnea on ABG - on 10/17 discussed poor prognosis with patient's wife and how he will likely not be able to wean from vent and certainly not be at his prior baseline. She stated she does not want to withdraw care and would like trach and peg, as she said this is what his advanced directive says.  - today 10/18 the patient's wife met with palliative care and brought in the patient's advance directive, which, it turns out, says just the opposite that he wouldn't want prolonged life support. Following the meeting, the family said they plan to withdraw care on  him, likely tomorrow.   2. Frontal lobe mass s/p craniotomy. Bx with Grade III Glioma  - Continue current antiepileptic regimen - Continue dexamethasone 69m IV BID (ok per Neurosurg)  - Appreciate oncology assistance. Molecular studies ordered and pending. Unclear whether he will ever be a candidate for chemotherapy based on his current degree of debilitation  RESP: 1. Acute Hypoxic Respiratory failure: - CXR today on my review shows basilar atelectasis.  - continue Chest PT for the atelectasis - tolerated an SBT today (PS 8, PEEP 5, FIO2 30%) x 5 hours, although RR occasionally up in high 20's.  - ultrasound of IVC on 10/16 showed IVC is completely FLAT at inspiration, consistent with intravascular hypovolemia.  Therefore will not give further lasix. Has edema in bilateral arms but may be due to DVT's not volume status.  - R-[place] CVC placed by IR on 10/17; Femoral CVC has since been removed.   RENAL: 1. Hx ESRD, S/p renal transplant; now with mild AKI: - Continue immmunosuppression with tacrolimus and mycophenolate - Follow BMP, urine output - Avoid nephrotoxins - developing mild AKI, think is pre-renal. Start 1/2NS @ 75cc/hr.   2. Hypernatremia (improving) - now 143; stopped D5W gtt this AM - continue free water flushes 400cc q4hrs  GI: 1. GI bleed 10/03 (resolved) - Continue pantoprazole  2. Hyperammonemia (resolved as of 10/17) - Continue tube feeding per protocol - continue Lactulose  ENDO 1. Hyperglycemia: - Continue Lantus, sliding scale insulin per protocol  CV: 1. Hypertension, HLD. - continue metoprolol 161m PO BID, hydralazine 1086mPO TID; increase norvasc from 81m42maily to BID - off nicardipine now.  - Labetalol as needed, goal SBP less than 160  HEME: 1. Anemia (stable) of critical illness, chronic disease, and GI bleed (resolved). - continue to monitor  2. Bilateral Upper extremity DVT's: - Subcutaneous heparin acceptable, no full dose heparin until at  least 10/19 post glioma resection  SUP - Protonix Nutrition -  Continue tube feeding  Goals of care - full code   Independent CC time 60 minutes  KatVernie MurdersD  11/11/2016, 6:35 PM [facility] [phone] if no answer [phone]

## 2016-11-12 ENCOUNTER — Inpatient Hospital Stay (HOSPITAL_COMMUNITY): Payer: Medicare HMO

## 2016-11-12 DIAGNOSIS — Z515 Encounter for palliative care: Secondary | ICD-10-CM

## 2016-11-12 LAB — MAGNESIUM: Magnesium: 2.1 mg/dL (ref 1.7–2.4)

## 2016-11-12 LAB — CBC WITH DIFFERENTIAL/PLATELET
BASOS ABS: 0 10*3/uL (ref 0.0–0.1)
BASOS PCT: 0 %
EOS PCT: 0 %
Eosinophils Absolute: 0 10*3/uL (ref 0.0–0.7)
HEMATOCRIT: 28.7 % — AB (ref 39.0–52.0)
Hemoglobin: 8.8 g/dL — ABNORMAL LOW (ref 13.0–17.0)
LYMPHS PCT: 2 %
Lymphs Abs: 0.2 10*3/uL — ABNORMAL LOW (ref 0.7–4.0)
MCH: 28.7 pg (ref 26.0–34.0)
MCHC: 30.7 g/dL (ref 30.0–36.0)
MCV: 93.5 fL (ref 78.0–100.0)
MONO ABS: 0.2 10*3/uL (ref 0.1–1.0)
MONOS PCT: 2 %
NEUTROS ABS: 9 10*3/uL — AB (ref 1.7–7.7)
Neutrophils Relative %: 96 %
PLATELETS: 220 10*3/uL (ref 150–400)
RBC: 3.07 MIL/uL — ABNORMAL LOW (ref 4.22–5.81)
RDW: 16.8 % — ABNORMAL HIGH (ref 11.5–15.5)
WBC: 9.4 10*3/uL (ref 4.0–10.5)

## 2016-11-12 LAB — BASIC METABOLIC PANEL
Anion gap: 9 (ref 5–15)
BUN: 75 mg/dL — ABNORMAL HIGH (ref 6–20)
CHLORIDE: 107 mmol/L (ref 101–111)
CO2: 20 mmol/L — ABNORMAL LOW (ref 22–32)
CREATININE: 1.34 mg/dL — AB (ref 0.61–1.24)
Calcium: 8.2 mg/dL — ABNORMAL LOW (ref 8.9–10.3)
GFR, EST AFRICAN AMERICAN: 59 mL/min — AB (ref >60)
GFR, EST NON AFRICAN AMERICAN: 51 mL/min — AB (ref >60)
Glucose, Bld: 149 mg/dL — ABNORMAL HIGH (ref 65–99)
POTASSIUM: 4.5 mmol/L (ref 3.5–5.1)
SODIUM: 136 mmol/L (ref 135–145)

## 2016-11-12 LAB — GLUCOSE, CAPILLARY
Glucose-Capillary: 162 mg/dL — ABNORMAL HIGH (ref 65–99)
Glucose-Capillary: 130 mg/dL — ABNORMAL HIGH (ref 65–99)

## 2016-11-12 LAB — PHOSPHORUS: Phosphorus: 4.9 mg/dL — ABNORMAL HIGH (ref 2.5–4.6)

## 2016-11-12 MED ORDER — POLYVINYL ALCOHOL 1.4 % OP SOLN: 1.0000 [drp] | Freq: Four times a day (QID) | OPHTHALMIC | Status: DC | PRN

## 2016-11-12 MED ORDER — FENTANYL BOLUS VIA INFUSION
50.0000 ug | INTRAVENOUS | Status: DC | PRN
  Administered 2016-11-13 (×2): 50 ug via INTRAVENOUS
  Filled 2016-11-12: qty 50

## 2016-11-12 MED ORDER — GLYCOPYRROLATE 0.2 MG/ML IJ SOLN: 0.2000 mg | INTRAMUSCULAR | Status: DC | PRN

## 2016-11-12 MED ORDER — FREE WATER: 200.0000 mL | Status: DC

## 2016-11-12 MED ORDER — SODIUM CHLORIDE 0.9 % IV SOLN
INTRAVENOUS | Status: DC
  Administered 2016-11-12: 10:00:00 via INTRAVENOUS

## 2016-11-12 MED ORDER — LORAZEPAM 2 MG/ML IJ SOLN: 2.0000 mg | INTRAMUSCULAR | Status: DC | PRN

## 2016-11-12 NOTE — Plan of Care (Signed)
Problem: Respiratory: Goal: Ability to maintain a clear airway and adequate ventilation will improve Outcome: Not Progressing Plan to terminally extubate on Sunday. Pt does not wean well on ventilator. Respirations shallow, labored. Pt tachypneic.

## 2016-11-12 NOTE — Progress Notes (Signed)
Daily Progress Note   Patient Name: Roger Gibson       Date: 11/12/2016 DOB: [birth year]  Age: 71 y.o. MRN#: [id number] Attending Physician: Raylene Miyamoto, MD Primary Care Physician: [name], MD Admit Date: 10/04/2016  Reason for Consultation/Follow-up: Establishing goals of care, Terminal Care and Withdrawal of life-sustaining treatment  Subjective: Patient in bed- spouse at bedside. Followed up with spouse on conversation from yesterday. She agrees with terminal wean. There are family members (daughter and son) coming from [place]. She is also discussing timing with patient's brother who is very upset. Spouse is clear that her goal is comfort and does not want patient to be in pain or suffer. Gave emotional support. 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 We discussed beginning to de-escalate care and focusing on comfort- stopping IV fluids, stopping medications that do not focus on comfort, nutrition, continuing infusion of pain medication.   Review of Systems  Unable to perform ROS: Intubated    Length of Stay: 22  Current Medications: Scheduled Meds:  . amLODipine  5 mg Oral BID  . bethanechol  10 mg Oral TID  . chlorhexidine gluconate (MEDLINE KIT)  15 mL Mouth Rinse BID  . Chlorhexidine Gluconate Cloth  6 each Topical Q0600  . cholecalciferol  2,000 Units Oral Daily  . dexamethasone  2 mg Intravenous Q12H  . feeding supplement (PRO-STAT SUGAR FREE 64)  30 mL Per Tube TID  . free water  200 mL Per Tube Q4H  . heparin subcutaneous  5,000 Units Subcutaneous Q12H  . hydrALAZINE  100 mg Oral Q8H  . insulin  aspart  0-20 Units Subcutaneous Q4H  . insulin aspart  6 Units Subcutaneous Q4H  . insulin glargine  25 Units Subcutaneous Daily  . lactulose  30 g Oral TID  . mouth rinse  15 mL Mouth Rinse 10 times per day  . metoprolol tartrate  100 mg Per Tube BID  . mycophenolate  250 mg Oral BID  . pantoprazole sodium  40 mg Per Tube Q24H  . pravastatin  20 mg Per Tube q1800  . predniSONE  50 mg Oral Irregular times  on Tue Wed  . sodium chloride flush  10-40 mL Intracatheter Q12H  . sodium chloride flush  3 mL Intravenous Q12H  . tacrolimus  3 mg Oral BID   Or  . tacrolimus  1.5 mg Sublingual BID    Continuous Infusions: . sodium chloride    . feeding supplement (GLUCERNA 1.2 CAL) 1,000 mL (11/12/16 0600)  . fentaNYL infusion INTRAVENOUS 50 mcg/hr (11/12/16 0612)  . lacosamide (VIMPAT) IV Stopped (11/11/16 2225)  . levETIRAcetam Stopped (11/11/16 2205)  . niCARDipine Stopped (11/10/16 1420)    PRN Meds: acetaminophen **OR** acetaminophen, albuterol, bisacodyl, camphor-menthol, docusate, fentaNYL (SUBLIMAZE) injection, fentaNYL (SUBLIMAZE) injection, hydrALAZINE, labetalol, levalbuterol, naLOXone (NARCAN)  injection, [DISCONTINUED] ondansetron **OR** ondansetron (ZOFRAN) IV, promethazine, sodium chloride flush  Physical Exam  Constitutional: He appears well-developed and well-nourished.  Cardiovascular: Normal rate and regular rhythm.   Pulmonary/Chest:  intubated  Abdominal: He exhibits distension.  Musculoskeletal:  Edema in bilateral upper extemities  Neurological:  Unresponsive  Skin: Skin is warm and dry.  Nursing note and vitals reviewed.           Vital Signs: BP (!) 164/77   Pulse 73   Temp (!) 97.1 F (36.2 C) (Axillary)   Resp (!) 23   Ht 5' 10"  (1.778 m)   Wt 105.6 kg (232 lb 12.9 oz)   SpO2 93%   BMI 33.40 kg/m  SpO2: SpO2: 93 % O2 Device: O2 Device: Ventilator O2 Flow Rate: O2 Flow Rate (L/min): 2 L/min  Intake/output summary:  Intake/Output Summary (Last 24  hours) at 11/12/16 0919 Last data filed at 11/12/16 0700  Gross per 24 hour  Intake          3902.75 ml  Output             2200 ml  Net          1702.75 ml   LBM: Last BM Date: 11/11/16 Baseline Weight: Weight: 90 kg (198 lb 6.6 oz) Most recent weight: Weight: 105.6 kg (232 lb 12.9 oz)       Palliative Assessment/Data: PPS: 10%      Patient Active Problem List   Diagnosis Date Noted  . On mechanically assisted ventilation ([place])   . Respiratory failure ([place])   . Brain mass   . Advance care planning   . Palliative care by specialist   . Brain metastases ([place])   . Abdominal distention   . Elevated AST (SGOT) 10/25/2016  . Thrombocytopenia ([place]) 10/25/2016  . Acute respiratory distress   . Metabolic acidosis 20/35/5974  . Acute respiratory failure ([place]) 10/23/2016  . SIRS (systemic inflammatory response syndrome) ([place]) 10/23/2016  . Pneumobilia 10/23/2016  . AKI (acute kidney injury) ([place]) 10/20/2016  . CKD (chronic kidney disease), stage III ([place]) 10/05/2016  . Elevated troponin 10/20/2016  . Vasogenic brain edema ([place]) 10/02/2016  . Acute encephalopathy 10/08/2016  . Acute focal neurological deficit 10/09/2016  . Chronic combined systolic and diastolic CHF (congestive heart failure) ([place]) 10/06/2016  . CHF exacerbation ([place]) 05/14/2014  . Renal transplant recipient 05/14/2014  . Renal failure, acute ([place]) 05/14/2014  . History of renal transplant   . Immunosuppressed status ([place])   . Hyperkalemia 08/01/2012  . Pulmonary edema 08/01/2012  . Leg pain 08/16/2010  . MRSA (methicillin resistant staph aureus) culture positive 07/20/2010  . Renal cell carcinoma 06/13/2010  . Confusion 05/19/2010  . ESRD on hemodialysis ([place]) 05/19/2010  . 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 ALLERGY, ENVIRONMENTAL 06/18/2009  . MALAISE AND FATIGUE  04/01/2008  . Hypokalemia 06/15/2007  . HYPERLIPIDEMIA 12/26/2006  . GOUT 12/26/2006  . ANEMIA-IRON DEFICIENCY 12/26/2006  . Essential  hypertension 12/26/2006  . HEMATURIA, HX OF 12/23/2006  . BENIGN PROSTATIC HYPERTROPHY, HX OF 12/23/2006  . PERS HX TOBACCO USE PRESENTING HAZARDS HEALTH 12/23/2006    Palliative Care Assessment & Plan   Patient Profile: 71 y.o. male  with past medical history of renal cell carcinoma, s/p two kidney transplants, hypertension, CHF, end stage renal disease, admitted on 10/02/2016 after his wife found him in the bedroom unable to stand, with crusted blood in his mouth and tongue. Workup revealed R frontal mass and post ictal state. Patient decompensated during admission requiring bipap 9/29. Biopsy via craniotomy was completed on 11/13/2016. Patient has remained unresponsive and intubated with poor weaning since 10/5. Admission has been complicated by hypernatremia, multiple DVT's, sepsis. Patient has not made progress. Palliative medicine consulted for [place].    Assessment/Recommendations/Plan   Plan for compassionate extubation Sunday- family is coming from [place]  Patient is comfort care now  Spouse requests staples removed from craniotomy incision- will request nursing to remove  Note Fentanyl continuous infusion is going at 3mg/hr for last 12 hours. Will add 50 mcg/hr bolus q374m prn as needed for signs of discomfort or pain.   Goals of Care and Additional Recommendations:  Limitations on Scope of Treatment: Full Comfort Care, Minimize Medications and No Tracheostomy  Code Status:  DNR  Prognosis:   Hours - Days likely after terminal extubation planned for Sunday  Discharge Planning:  Anticipated Hospital Death  Care plan was discussed with patient's spouse and Dr. HaJimmey Ralph  Thank you for allowing the Palliative Medicine Team to assist in the care of this patient.   Time In: 0900 Time Out: 0945 Total Time 45 mins Prolonged Time Billed No      Greater than 50%  of this time was spent counseling and coordinating care related to the above assessment and  plan.  KaMariana KaufmanAGNP-C Palliative Medicine   Please contact Palliative Medicine Team phone at [phone] questions and concerns.

## 2016-11-12 NOTE — Progress Notes (Signed)
PULMONARY / CRITICAL CARE MEDICINE   Name: Roger Gibson MRN: [id number] DOB: 07/07/1945    ADMISSION DATE:  10/20/2016 CONSULTATION DATE:  11/13/2016  REFERRING MD: Dr. Wynelle Cleveland, [facility]  CHIEF COMPLAINT:  Lt arm weakness  HISTORY OF PRESENT ILLNESS:   71 yo male with Hx HTN, Anemia, Right renal cell cancer with ESRD s/p transplant, CHF now admitted 9/27 with Lt arm weakness and found to have frontal lobe mass. Had craniotomy 10/05 and remained on vent post op.   SUBJECTIVE:  L femoral line remains in place.  R-[place] CVC placed this AM but IR 10/14: Tolerated PSV with PS10/PEEP5 for 5 hours after which time had increased WOB.  10/16: tolerated PSV with PS15/PEEP5 x 12hrs without difficulty while on propofol; continued to have tachypnea when propofol weaned.  10/17: found to have DVT in left arm as well; already had one in right arm. Weaned off propofol and onto fentanyl gtt. 10/18: fentanyl gtt stopped. Still with minimal response. [name] had meeting with palliative care, decided to make patient DNR 10/19: fentanyl gtt restarted as patient with tachypnea without it. [name] met with palliative care again, plans to withdraw care on Sunday after other family members arrive.   VITAL SIGNS: BP (!) 180/101   Pulse 75   Temp 99.8 F (37.7 C) (Axillary)   Resp (!) 24   Ht 5' 10" (1.778 m)   Wt 105.6 kg (232 lb 12.9 oz)   SpO2 94%   BMI 33.40 kg/m   VENTILATOR SETTINGS: Vent Mode: PRVC FiO2 (%):  [30 %-40 %] 40 % Set Rate:  [18 bmp] 18 bmp Vt Set:  [580 mL] 580 mL PEEP:  [2 cmH20-5 cmH20] 5 cmH20 Pressure Support:  [5 cmH20] 5 cmH20 Plateau Pressure:  [12 cmH20-19 cmH20] 19 cmH20  INTAKE / OUTPUT: I/O last 3 completed shifts: In: 6231.3 [I.V.:1766.3; NG/GT:4160; IV Piggyback:305] Out: 3050 [Urine:2550; Stool:500]  PHYSICAL EXAMINATION: General: Ill-appearing man, mechanically ventilated Neuro: somnolent, intermittently opens eyes to sternal rub but does not focus  or track, not following commands (exam does not change when off the fentanyl) HEENT: 7.5 ETT in position, small amount of creamy ETT secretions.  scalp staples clean and dry PULM: CTA b/l, no crackles, no wheezing CV:  Regular, no murmur GI: soft, NTND, positive bowel sounds Extremities: 2+ tight nonpitting Bilateral upper extremities and 1+ BLE edema  LABS:  BMET  Recent Labs Lab 11/10/16 0549  11/10/16 2250 11/11/16 0412 11/12/16 0409  NA 150*  < > 145 143 136  K 4.4  --   --  4.8 4.5  CL 121*  --   --  114* 107  CO2 22  --   --  21* 20*  BUN 48*  --   --  64* 75*  CREATININE 0.70  --   --  1.19 1.34*  GLUCOSE 167*  --   --  108* 149*  < > = values in this interval not displayed.  Electrolytes  Recent Labs Lab 11/10/16 0549 11/11/16 0412 11/12/16 0409  CALCIUM 9.0 8.7* 8.2*  MG 2.3 2.2 2.1  PHOS 3.6 5.0* 4.9*    CBC  Recent Labs Lab 11/10/16 0549 11/11/16 0412 11/12/16 0409  WBC 11.0* 13.9* 9.4  HGB 8.0* 8.4* 8.8*  HCT 25.3* 27.3* 28.7*  PLT 260 263 220    Coag's  Recent Labs Lab 11/10/16 0549  INR 1.03    Sepsis Markers No results for input(s): LATICACIDVEN, PROCALCITON, O2SATVEN in the last 168  hours.  ABG  Recent Labs Lab 11/08/16 0917  PHART 7.433  PCO2ART 32.5  PO2ART 99.4    Liver Enzymes No results for input(s): AST, ALT, ALKPHOS, BILITOT, ALBUMIN in the last 168 hours.  Cardiac Enzymes No results for input(s): TROPONINI, PROBNP in the last 168 hours.  Glucose  Recent Labs Lab 11/11/16 1137 11/11/16 1623 11/11/16 1945 11/12/16 0001 11/12/16 0349 11/12/16 0734  GLUCAP 117* 132* 67 107* 162* 130*    Imaging Dg Chest Port 1 View  Result Date: 11/12/2016 CLINICAL DATA:  71 year old male with history of acute respiratory failure with hypoxia. EXAM: PORTABLE CHEST 1 VIEW COMPARISON:  Chest x-ray 11/11/2016. FINDINGS: An endotracheal tube is in place with tip 1.8 cm above the carina. There is a right-sided internal  jugular central venous catheter with tip terminating in the right atrium. A feeding tube is seen extending into the abdomen, however, the tip of the feeding tube extends below the lower margin of the image. Lung volumes are low. Patchy opacities throughout the mid to lower lungs bilaterally (left greater than right), increasing compared to the prior study, concerning for worsening areas of airspace consolidation and atelectasis. Small left pleural effusion has slightly increased compared to the prior study. No evidence of pulmonary edema. Heart size appears borderline enlarged. The patient is rotated to the left on today's exam, resulting in distortion of the mediastinal contours and reduced diagnostic sensitivity and specificity for mediastinal pathology. Atherosclerosis in the thoracic aorta. Vascular stent projecting over the upper mediastinum, likely in the innominate vein IMPRESSION: 1. Support apparatus, as above. 2. Worsening aeration throughout the mid to lower lungs bilaterally, concerning for worsening multilobar pneumonia or sequela of aspiration. 3. Increasing small left pleural effusion. 4. Aortic atherosclerosis. Electronically Signed   By: Vinnie Langton M.D.   On: 11/12/2016 07:19   STUDIES:  EEG 9/27 >> lateralized periodic discharge Rt posterior quadrant with delta morphology EEG 10/9 >> diffuse encephalopathy, more severe cortical dysfunction of R hemisphere, no seizures   Echo 9/30 >> EF 55 to 60%, mod LVH, grade 1 DD, mild MR  MRI 10/8 >> Post-op changes surrounding biopsy site with residual mass and edema   CULTURES: Blood 10/01 >> negative Brain Bx 10/05 >>Grade III Glioma    SIGNIFICANT EVENTS: 09/27 Admit 10/05 Craniotomy with biopsy collected 10/8- not waking up, neuro re called  LINES/TUBES: ETT 10/05 >>  Lt femoral CVL 10/05 >>10/17 R-[place] CVC 10/17 >> Right IJ single-lumen IV 10/12 >> removed  ASSESSMENT / PLAN: NEURO: 1. Acute encephalopathy with bilateral  upper extremity paresis - presumed this acute worsening of mental status to be metabolic in nature. Brain MRI on 10/8 showed no acute changes from prior imaging. EEG on 10/8 was consistent with metabolic encephalopathy. Patient afebrile with no leukocytosis. Had mild hypernatremia (resolved now) but not severe enough to cause mental status changes. Does not wake up when sedation is held, only become more tachypneic. - ammonia elevated on 10/16 but now normal since then. continuing lactulose.  - no hypercapnea on ABG - patient's wife made patient DNR and plans on terminal extubation on Sunday 10/21. Per palliative, he is full comfort care at this point, with the exception of remaining on the vent.   2. Frontal lobe mass s/p craniotomy. Bx with Grade III Glioma  - Continue current antiepileptic regimen (preventing seizures could be said to be a comfort care measure) - Continue dexamethasone 28m IV BID   RESP: 1. Acute Hypoxic Respiratory failure: - continue  Chest PT for the atelectasis - was on SBT this AM when I was rounding but had RR 24 and Pox 91% with accessory muscle use. Will stop performing SBT's since plan is withdrawal.  - R-[place] CVC placed by IR on 10/17; Femoral CVC has since been removed.   RENAL: 1. Hx ESRD, S/p renal transplant; now with mild AKI: - Discontinue tacrolimus and mycophenolate since he is now going comfort care - d/c labs.  - d/c IVF's.   2. Hypernatremia (resolved) - d/c free water flushes and IVF's given the change to comfort care  GI: 1. GI bleed 10/03 (resolved) - d/c PPI  2. Hyperammonemia (resolved as of 10/17) - d/c tube feeding and Lactulose  ENDO 1. Hyperglycemia: - d/c lantus and SSI as well as accuchecks   CV: 1. Hypertension, HLD. - currently on metoprolol 100mg PO BID, hydralazine 100mg PO TID, norvasc 5mg BID; these can be stopped as they do not provide comfort.  - off nicardipine gtt  HEME: 1. Anemia (stable) of critical illness,  chronic disease, and GI bleed (resolved).  2. Bilateral Upper extremity DVT's: - stop Subcutaneous heparin acceptable   Independent CC time 40 minutes   , MD  11/12/2016, 2:36 PM [facility] [phone] or if no answer [phone]  

## 2016-11-12 NOTE — Progress Notes (Signed)
Paged Dr. Jimmey Ralph re: SBP 190, no PRN BP meds. MD not concerned as pt is comfort care, no new orders.

## 2016-11-13 MED ORDER — CHLORHEXIDINE GLUCONATE 0.12 % MT SOLN
OROMUCOSAL | Status: AC
  Administered 2016-11-13: 15 mL via OROMUCOSAL
  Filled 2016-11-13: qty 15

## 2016-11-13 NOTE — Progress Notes (Signed)
RT called to pt's room due to desat. RT spoke to CCM and increased FIO2 to 50%.  RN notified.

## 2016-11-13 NOTE — Progress Notes (Signed)
RT called to pt's room due to desat to 88%.  RT bag lavaged pt and suctioned a moderate amount of yellow/white thick secretions.  Pt placed back on full vent support. Sat 100%.  RN notified.

## 2016-11-13 NOTE — Progress Notes (Signed)
PULMONARY / CRITICAL CARE MEDICINE   Name: Roger Gibson MRN: [id number] DOB: [birth year]    ADMISSION DATE:  10/17/2016 CONSULTATION DATE:  11/17/2016  REFERRING MD: Dr. Wynelle Cleveland, [facility]  CHIEF COMPLAINT:  Lt arm weakness  HISTORY OF PRESENT ILLNESS:   71 yo male with Hx HTN, Anemia, Right renal cell cancer with ESRD s/p transplant, CHF now admitted 9/27 with Lt arm weakness and found to have frontal lobe mass. Had craniotomy 10/05 and remained on vent post op.   SUBJECTIVE:  R-[place] CVC by IR 10/14: Tolerated PSV with PS10/PEEP5 for 5 hours after which time had increased WOB.  10/16: tolerated PSV with PS15/PEEP5 x 12hrs without difficulty while on propofol; continued to have tachypnea when propofol weaned.  10/17: found to have DVT in left arm as well; already had one in right arm. Weaned off propofol and onto fentanyl gtt. 10/18: fentanyl gtt stopped. Still with minimal response. [name] had meeting with palliative care, decided to make patient DNR 10/19: fentanyl gtt restarted as patient with tachypnea without it. [name] met with palliative care again, plans to withdraw care on Sunday after other family members arrive.   VITAL SIGNS: BP (!) 148/110   Pulse 97   Temp 99.6 F (37.6 C) (Axillary)   Resp (!) 22   Ht 5' 10"  (1.778 m)   Wt 105.6 kg (232 lb 12.9 oz)   SpO2 94%   BMI 33.40 kg/m   VENTILATOR SETTINGS: Vent Mode: PRVC FiO2 (%):  [40 %] 40 % Set Rate:  [18 bmp] 18 bmp Vt Set:  [580 mL] 580 mL PEEP:  [5 cmH20] 5 cmH20 Plateau Pressure:  [15 cmH20-32 cmH20] 32 cmH20  INTAKE / OUTPUT: I/O last 3 completed shifts: In: 2585 [I.V.:1005; NG/GT:1275; IV Piggyback:305] Out: 3725 [Urine:3725]  PHYSICAL EXAMINATION: General: Ill-appearing man, mechanically ventilated Neuro: somnolent, no response to sternal rub, not obeying commands, on fentanyl gtt.   HEENT: 7.5 ETT in position, small amount of creamy ETT secretions.  scalp staples clean and dry PULM: CTA  b/l, no crackles, no wheezing CV:  Regular, no murmur GI: soft, NTND, positive bowel sounds Extremities: 2+ tight nonpitting Bilateral upper extremities and 1+ BLE edema  LABS:  BMET  Recent Labs Lab 11/10/16 0549  11/10/16 2250 11/11/16 0412 11/12/16 0409  NA 150*  < > 145 143 136  K 4.4  --   --  4.8 4.5  CL 121*  --   --  114* 107  CO2 22  --   --  21* 20*  BUN 48*  --   --  64* 75*  CREATININE 0.70  --   --  1.19 1.34*  GLUCOSE 167*  --   --  108* 149*  < > = values in this interval not displayed.  Electrolytes  Recent Labs Lab 11/10/16 0549 11/11/16 0412 11/12/16 0409  CALCIUM 9.0 8.7* 8.2*  MG 2.3 2.2 2.1  PHOS 3.6 5.0* 4.9*    CBC  Recent Labs Lab 11/10/16 0549 11/11/16 0412 11/12/16 0409  WBC 11.0* 13.9* 9.4  HGB 8.0* 8.4* 8.8*  HCT 25.3* 27.3* 28.7*  PLT 260 263 220    Coag's  Recent Labs Lab 11/10/16 0549  INR 1.03    Sepsis Markers No results for input(s): LATICACIDVEN, PROCALCITON, O2SATVEN in the last 168 hours.  ABG  Recent Labs Lab 11/08/16 0917  PHART 7.433  PCO2ART 32.5  PO2ART 99.4    Liver Enzymes No results for input(s): AST, ALT, ALKPHOS,  BILITOT, ALBUMIN in the last 168 hours.  Cardiac Enzymes No results for input(s): TROPONINI, PROBNP in the last 168 hours.  Glucose  Recent Labs Lab 11/11/16 1137 11/11/16 1623 11/11/16 1945 11/12/16 0001 11/12/16 0349 11/12/16 0734  GLUCAP 117* 132* 67 107* 162* 130*    Imaging No results found. STUDIES:  EEG 9/27 >> lateralized periodic discharge Rt posterior quadrant with delta morphology EEG 10/9 >> diffuse encephalopathy, more severe cortical dysfunction of R hemisphere, no seizures   Echo 9/30 >> EF 55 to 60%, mod LVH, grade 1 DD, mild MR  MRI 10/8 >> Post-op changes surrounding biopsy site with residual mass and edema   CULTURES: Blood 10/01 >> negative Brain Bx 10/05 >>Grade III Glioma    SIGNIFICANT EVENTS: 09/27 Admit 10/05 Craniotomy with biopsy  collected 10/8- not waking up, neuro re called  LINES/TUBES: ETT 10/05 >>  Lt femoral CVL 10/05 >>10/17 R-[place] CVC 10/17 >> Right IJ single-lumen IV 10/12 >> removed  ASSESSMENT / PLAN: NEURO: 1. Acute encephalopathy with bilateral upper extremity paresis - presumed this acute worsening of mental status to be metabolic in nature. Brain MRI on 10/8 showed no acute changes from prior imaging. EEG on 10/8 was consistent with metabolic encephalopathy. Patient afebrile with no leukocytosis. Had mild hypernatremia (resolved now) but not severe enough to cause mental status changes. Does not wake up when sedation is held, only become more tachypneic. - ammonia elevated on 10/16 but now normal since then. Have stopped lactulose given comfort care.  - patient's wife made patient DNR and plans on terminal extubation on Sunday 10/21. Per palliative, he is full comfort care at this point, with the exception of remaining on the vent.  - spent 15 minutes today in conversation with patient's wife regarding how we will ensure patient's comfort.  2. Frontal lobe mass s/p craniotomy. Bx with Grade III Glioma  - Continue current antiepileptic regimen (preventing seizures could be said to be a comfort care measure) - Continue dexamethasone 58m IV BID   RESP: 1. Acute Hypoxic Respiratory failure: - no further chest PT or SBT's indicated as patient now comfort care - R-[place] CVC placed by IR on 10/17; Femoral CVC has since been removed.   RENAL: 1. Hx ESRD, S/p renal transplant; now with mild AKI: - Discontinue tacrolimus and mycophenolate since he is now going comfort care - d/c labs.  - d/c IVF's.   2. Hypernatremia (resolved) - d/c free water flushes and IVF's given the change to comfort care  GI: 1. GI bleed 10/03 (resolved) - d/c PPI  2. Hyperammonemia (resolved as of 10/17) - d/c tube feeding and Lactulose  ENDO 1. Hyperglycemia: - d/c lantus and SSI as well as accuchecks   CV: 1.  Hypertension, HLD. - currently on metoprolol 1044mPO BID, hydralazine 10070mO TID, norvasc 5mg31mD; these can be stopped as they do not provide comfort.  - off nicardipine gtt  HEME: 1. Anemia (stable) of critical illness, chronic disease, and GI bleed (resolved).  2. Bilateral Upper extremity DVT's: - stop Subcutaneous heparin acceptable   20 minutes spent on this consult   KathVernie Murders  11/13/2016, 9:43 AM [facility] [phone] no answer [phone]

## 2016-11-14 DIAGNOSIS — Z515 Encounter for palliative care: Secondary | ICD-10-CM

## 2016-11-14 DIAGNOSIS — C7931 Secondary malignant neoplasm of brain: Secondary | ICD-10-CM

## 2016-11-14 DIAGNOSIS — J9601 Acute respiratory failure with hypoxia: Secondary | ICD-10-CM

## 2016-11-14 MED ORDER — SODIUM CHLORIDE 0.9 % IV SOLN
10.0000 mg/h | INTRAVENOUS | Status: DC
  Administered 2016-11-14: 8 mg/h via INTRAVENOUS
  Filled 2016-11-14: qty 10

## 2016-11-14 MED ORDER — MORPHINE BOLUS VIA INFUSION
4.0000 mg | INTRAVENOUS | Status: DC | PRN
  Filled 2016-11-14: qty 10

## 2016-11-14 MED ORDER — ACETAMINOPHEN 325 MG PO TABS: 650.0000 mg | ORAL_TABLET | Freq: Four times a day (QID) | ORAL | Status: DC | PRN

## 2016-11-14 MED ORDER — ONDANSETRON HCL 4 MG/2ML IJ SOLN: 4.0000 mg | Freq: Four times a day (QID) | INTRAMUSCULAR | Status: DC | PRN

## 2016-11-14 MED ORDER — BIOTENE DRY MOUTH MT LIQD: 15.0000 mL | OROMUCOSAL | Status: DC | PRN

## 2016-11-14 MED ORDER — FUROSEMIDE 10 MG/ML IJ SOLN
INTRAMUSCULAR | Status: AC
  Administered 2016-11-14: 80 mg via INTRAVENOUS
  Filled 2016-11-14: qty 8

## 2016-11-14 MED ORDER — FUROSEMIDE 10 MG/ML IJ SOLN
80.0000 mg | Freq: Once | INTRAMUSCULAR | Status: AC
  Administered 2016-11-14: 80 mg via INTRAVENOUS

## 2016-11-14 MED ORDER — GLYCOPYRROLATE 0.2 MG/ML IJ SOLN
0.4000 mg | Freq: Four times a day (QID) | INTRAMUSCULAR | Status: DC
  Administered 2016-11-14: 0.4 mg via INTRAVENOUS
  Filled 2016-11-14: qty 2

## 2016-11-14 MED ORDER — GLYCOPYRROLATE 0.2 MG/ML IJ SOLN: 0.2000 mg | INTRAMUSCULAR | Status: DC | PRN

## 2016-11-14 MED ORDER — POLYVINYL ALCOHOL 1.4 % OP SOLN
1.0000 [drp] | Freq: Four times a day (QID) | OPHTHALMIC | Status: DC | PRN
  Filled 2016-11-14: qty 15

## 2016-11-14 MED ORDER — MIDAZOLAM BOLUS VIA INFUSION
2.0000 mg | INTRAVENOUS | Status: DC | PRN
  Filled 2016-11-14: qty 10

## 2016-11-14 MED ORDER — CHLORHEXIDINE GLUCONATE 0.12 % MT SOLN
OROMUCOSAL | Status: AC
  Administered 2016-11-14: 15 mL via OROMUCOSAL
  Filled 2016-11-14: qty 15

## 2016-11-14 MED ORDER — ACETAMINOPHEN 650 MG RE SUPP: 650.0000 mg | Freq: Four times a day (QID) | RECTAL | Status: DC | PRN

## 2016-11-14 MED ORDER — FENTANYL BOLUS VIA INFUSION
100.0000 ug | INTRAVENOUS | Status: DC | PRN
  Filled 2016-11-14: qty 200

## 2016-11-14 MED ORDER — GLYCOPYRROLATE 0.2 MG/ML IJ SOLN: 0.4000 mg | Freq: Three times a day (TID) | INTRAMUSCULAR | Status: DC

## 2016-11-14 MED ORDER — MORPHINE BOLUS VIA INFUSION
4.0000 mg | INTRAVENOUS | Status: DC | PRN
  Administered 2016-11-14: 4 mg via INTRAVENOUS
  Filled 2016-11-14: qty 10

## 2016-11-14 MED ORDER — LORAZEPAM 2 MG/ML IJ SOLN
2.0000 mg | Freq: Once | INTRAMUSCULAR | Status: AC
  Administered 2016-11-14: 2 mg via INTRAVENOUS
  Filled 2016-11-14: qty 1

## 2016-11-14 MED ORDER — MORPHINE BOLUS VIA INFUSION: 10.0000 mg | INTRAVENOUS | Status: DC | PRN

## 2016-11-14 MED ORDER — ONDANSETRON 4 MG PO TBDP: 4.0000 mg | ORAL_TABLET | Freq: Four times a day (QID) | ORAL | Status: DC | PRN

## 2016-11-14 MED ORDER — SODIUM CHLORIDE 0.9 % IV SOLN
2.0000 mg/h | INTRAVENOUS | Status: DC
  Administered 2016-11-14: 4 mg/h via INTRAVENOUS
  Filled 2016-11-14: qty 10

## 2016-11-18 ENCOUNTER — Telehealth: Payer: Self-pay

## 2016-11-18 NOTE — Telephone Encounter (Signed)
On 11/18/16 I received a d/c from [place] (original). The d/c is for burial. The patient is a patient of Doctor Nelda Marseille. The d/c will be taken to [facility] ([facility] heart) this pm for signature.  On 11/23/16 I received the d/c back from Doctor Nelda Marseille. I got the d/c ready and called the funeral home to let them know the d/c is ready for pickup.

## 2016-11-19 ENCOUNTER — Encounter (HOSPITAL_COMMUNITY): Payer: Self-pay

## 2016-11-25 NOTE — Progress Notes (Signed)
Wasted 35 mL of versed and 230 mL of morphine in sink with Leverne Humbles, RN.

## 2016-11-25 NOTE — Discharge Summary (Signed)
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  SECONDARY DIAGNOSES:  Acute encephalopathy with bilateral upper extremity paresis, acute hypoxic respiratory failure, end-state renal disease, hypernatremia, gastrointestinal bleed, hyperammonemia, hyperglycemia, hypertension, anemia of critical illness, and bilateral upper extremity deep vein thromboses.  HOSPITAL COURSE:  The patient is a 71 year old male __________ end-stage renal disease, status post renal transplantation, who presented to the hospital with bilateral upper and lower extremity weakness, was found to have a grade 3 glioma in the frontal lobe, and was taken to the operating room for a craniotomy.  Was found to have a grade 3 glioma as pathology from his brain mass excision.  The patient remained on ventilator postop and failed to make any significant progress, at which point, Dr. __________ had a conversation with family and discussed the prognosis of the patient, at which time, they elected the patient would not want this level of care, would not want tracheostomy and a PEG tube, and nursing home placement.  Decision was made to change the patient's status to comfort care, at which point, on October 21, the patient was started on morphine and was extubated, expired shortly thereafter.     Providence Lanius, MD     WJY/MEDQ  D:  11/17/2016  T:  11/17/2016  Job:  [id number]

## 2016-11-25 NOTE — Procedures (Signed)
Extubation Procedure Note  Patient Details:   Name: Roger Gibson DOB: 07/02/1945 MRN: [id number]   Airway Documentation:     Evaluation  O2 sats: currently acceptable Complications: No apparent complications Patient did tolerate procedure well. Bilateral Breath Sounds: Diminished, Rhonchi   No   Terminal extubation.  Pt placed on Echo 2 L with humidity for comfort. RN, NP, and family present.  Mingo Amber Kaliann Coryell Dec 12, 2016, 12:19 PM

## 2016-11-25 NOTE — Progress Notes (Signed)
                                                                                                                                                                                                         Daily Progress Note   Patient Name: Roger Gibson       Date: 11/05/2016 DOB: 03/02/1945  Age: 71 y.o. MRN#: [id number] Attending Physician: Feinstein, Daniel J, MD Primary Care Physician: Husain, Karrar, MD Admit Date: 10/08/2016  Reason for Consultation/Follow-up: Non pain symptom management, Pain control, Psychosocial/spiritual support, Terminal Care and Withdrawal of life-sustaining treatment  Subjective: Pt seen today at family request to pursue one-way extubation. He has been tachypneic with any weaning. He is breathing above the vent. He is currently on fentanyl infusion 100mcg/hr. Creatine 1.34  Length of Stay: 24  Current Medications: Scheduled Meds:  . chlorhexidine gluconate (MEDLINE KIT)  15 mL Mouth Rinse BID  . Chlorhexidine Gluconate Cloth  6 each Topical Q0600  . glycopyrrolate  0.4 mg Intravenous Q6H    Continuous Infusions: . lacosamide (VIMPAT) IV Stopped (11/09/2016 1114)  . levETIRAcetam Stopped (11/04/2016 1008)  . midazolam (VERSED) infusion    . morphine      PRN Meds: antiseptic oral rinse, bisacodyl, camphor-menthol, glycopyrrolate, midazolam, morphine, [DISCONTINUED] ondansetron **OR** ondansetron (ZOFRAN) IV, polyvinyl alcohol  Physical Exam  Constitutional: He appears well-developed and well-nourished.  Critically ill appearing man in ventilator  Cardiovascular:  tachy  Pulmonary/Chest:  Increased work of breathing on vent Breathing above the vent  Abdominal: Soft.  Genitourinary:  Genitourinary Comments: Foley Rectal tube  Neurological:  minimally responsive   Skin: Skin is warm and dry.  Psychiatric:  Unable to test  Nursing note and vitals reviewed.           Vital Signs: BP (!) 136/45   Pulse 89   Temp 98.4 F (36.9 C) (Oral)    Resp (!) 30   Ht 5' 10" (1.778 m)   Wt 105.6 kg (232 lb 12.9 oz)   SpO2 96%   BMI 33.40 kg/m  SpO2: SpO2: 96 % O2 Device: O2 Device: Ventilator O2 Flow Rate: O2 Flow Rate (L/min): 2 L/min  Intake/output summary:  Intake/Output Summary (Last 24 hours) at 11/21/2016 1145 Last data filed at 11/11/2016 1114  Gross per 24 hour  Intake              485 ml  Output             4800 ml  Net            -  4315 ml   LBM: Last BM Date: 11/23/2016 Baseline Weight: Weight: 90 kg (198 lb 6.6 oz) Most recent weight: Weight: 105.6 kg (232 lb 12.9 oz)       Palliative Assessment/Data:      Patient Active Problem List   Diagnosis Date Noted  . Terminal care   . On mechanically assisted ventilation ([place])   . Respiratory failure ([place])   . Brain mass   . Advanced care planning/counseling discussion   . Palliative care by specialist   . Brain metastases ([place])   . Abdominal distention   . Elevated AST (SGOT) 10/25/2016  . Thrombocytopenia ([place]) 10/25/2016  . Acute respiratory distress   . Metabolic acidosis 62/83/6629  . Acute respiratory failure ([place]) 10/23/2016  . SIRS (systemic inflammatory response syndrome) ([place]) 10/23/2016  . Pneumobilia 10/23/2016  . AKI (acute kidney injury) ([place]) 10/07/2016  . CKD (chronic kidney disease), stage III ([place]) 10/17/2016  . Elevated troponin 10/13/2016  . Vasogenic brain edema ([place]) 10/16/2016  . Acute encephalopathy 10/01/2016  . Acute focal neurological deficit 09/25/2016  . Chronic combined systolic and diastolic CHF (congestive heart failure) ([place]) 10/08/2016  . CHF exacerbation ([place]) 05/14/2014  . Renal transplant recipient 05/14/2014  . Renal failure, acute ([place]) 05/14/2014  . History of renal transplant   . Immunosuppressed status ([place])   . Hyperkalemia 08/01/2012  . Pulmonary edema 08/01/2012  . Leg pain 08/16/2010  . MRSA (methicillin resistant staph aureus) culture positive 07/20/2010  . Renal cell carcinoma 06/13/2010  . Confusion  05/19/2010  . ESRD on hemodialysis ([place]) 05/19/2010  . 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 ALLERGY, ENVIRONMENTAL 06/18/2009  . MALAISE AND FATIGUE 04/01/2008  . Hypokalemia 06/15/2007  . HYPERLIPIDEMIA 12/26/2006  . GOUT 12/26/2006  . ANEMIA-IRON DEFICIENCY 12/26/2006  . Essential hypertension 12/26/2006  . HEMATURIA, HX OF 12/23/2006  . BENIGN PROSTATIC HYPERTROPHY, HX OF 12/23/2006  . PERS HX TOBACCO USE PRESENTING HAZARDS HEALTH 12/23/2006    Palliative Care Assessment & Plan   Patient Profile: 71 y.o.malewith past medical history of renal cell carcinoma, s/p two kidney transplants, hypertension, CHF, end stage renal disease, admitted on 9/27/2018after his wife found him in the bedroom unable to stand, with crusted blood in his mouth and tongue. Workup revealed R frontal mass and post ictal state. Patient decompensated during admission requiring bipap 9/29. Biopsy via craniotomy was completed on 11/16/2016. Patient has remained unresponsive and intubated with poor weaning since 10/5. Admission has been complicated by hypernatremia, multiple DVT's, sepsis. Patient has not made progress. Palliative medicine consulted for [place].   Family has arrived from out of town and now pursuing one way extubation for comfort   Recommendations/Plan:  Start MS04 continuous infusion 4-10 mg/hr with 4-10 q15 min PRN for dyspnea/pain  Start versed continuous gttt 4-10/hr and 4-10 q 20 min prn  Start scheduled robinul 0.4 q6 ATC and q4 PRN  Haldol PRN  Extubate to [place]. Do not advance to face mask or bipap  Continue anticonvulsants  DC Cortrak if family desires  Goals of Care and Additional Recommendations:  Limitations on Scope of Treatment: Full Comfort Care  Code Status:    Code Status Orders        Start     Ordered   2016-11-23 1116  Do not attempt resuscitation (DNR)  Continuous    Question Answer Comment  In the event of cardiac or respiratory ARREST Do not call a "code blue"     In the event of cardiac or respiratory ARREST  Do not perform Intubation, CPR, defibrillation or ACLS   In the event of cardiac or respiratory ARREST Use medication by any route, position, wound care, and other measures to relive pain and suffering. May use oxygen, suction and manual treatment of airway obstruction as needed for comfort.      11/04/2016 1124    Code Status History    Date Active Date Inactive Code Status Order ID Comments User Context   11/12/2016  9:53 AM 11/24/2016 11:24 AM DNR [id number]  Mahan, Kasie J, NP Inpatient   11/11/2016 10:06 AM 11/12/2016  9:53 AM DNR [id number]  Mahan, Kasie J, NP Inpatient   10/04/2016  6:41 PM 11/11/2016 10:06 AM Full Code [id number]  Opyd, Timothy S, MD ED   08/01/2012 10:47 AM 08/03/2012  7:32 PM Full Code [id number]  Le, Peter, MD Inpatient    Advance Directive Documentation     Most Recent Value  Type of Advance Directive  Healthcare Power of Attorney  Pre-existing out of facility DNR order (yellow form or pink MOST form)  -  "MOST" Form in Place?  -       Prognosis:   Hours - Days  Discharge Planning:  Anticipated Hospital Death   Thank you for allowing the Palliative Medicine Team to assist in the care of this patient. Staffed with Dr. Yacoub   Time In: 1100 Time Out: 1230 Total Time 90 min Prolonged Time Billed  yes       Greater than 50%  of this time was spent counseling and coordinating care related to the above assessment and plan.   Grace , NP  Please contact Palliative Medicine Team phone at [phone] for questions and concerns.      

## 2016-11-25 NOTE — Progress Notes (Addendum)
Received order to terminally extubate patient. Pt was extubated per protocol at 1610 and comfort measures were provided to the patient and family. RN continued to monitor patient. Pt appeared comfortable with medications provided. At 1248 asystole was observed on the monitor and 2 RNs independently auscultated for heart and lung sounds which were determined to be absent. Time of death was declared at 1249. Dr. Nelda Marseille of [facility] was notified and acknowledged. Pt is not a candidate for organ donation. Pt is not a medical examiner case.

## 2016-11-25 NOTE — Progress Notes (Signed)
PULMONARY / CRITICAL CARE MEDICINE   Name: Roger Gibson MRN: [id number] DOB: 1945-03-23    ADMISSION DATE:  10/02/2016 CONSULTATION DATE:  11/11/2016  REFERRING MD: Dr. Wynelle Cleveland, [facility]  CHIEF COMPLAINT:  Lt arm weakness  HISTORY OF PRESENT ILLNESS:   71 yo male with Hx HTN, Anemia, Right renal cell cancer with ESRD s/p transplant, CHF now admitted 9/27 with Lt arm weakness and found to have frontal lobe mass. Had craniotomy 10/05 and remained on vent post op.   SUBJECTIVE:  R-[place] CVC by IR 10/14: Tolerated PSV with PS10/PEEP5 for 5 hours after which time had increased WOB.  10/16: tolerated PSV with PS15/PEEP5 x 12hrs without difficulty while on propofol; continued to have tachypnea when propofol weaned.  10/17: found to have DVT in left arm as well; already had one in right arm. Weaned off propofol and onto fentanyl gtt. 10/18: fentanyl gtt stopped. Still with minimal response. [name] had meeting with palliative care, decided to make patient DNR 10/19: fentanyl gtt restarted as patient with tachypnea without it. [name] met with palliative care again, plans to withdraw care on Sunday after other family members arrive.  10/21 terminal extubation with palliative leading  VITAL SIGNS: BP (!) 136/45   Pulse 89   Temp 98.4 F (36.9 C) (Oral)   Resp (!) 26   Ht 5' 10"  (1.778 m)   Wt 105.6 kg (232 lb 12.9 oz)   SpO2 96%   BMI 33.40 kg/m   VENTILATOR SETTINGS: Vent Mode: PRVC FiO2 (%):  [50 %] 50 % Set Rate:  [18 bmp] 18 bmp Vt Set:  [580 mL] 580 mL PEEP:  [5 cmH20] 5 cmH20 Plateau Pressure:  [18 cmH20-20 cmH20] 20 cmH20  INTAKE / OUTPUT: I/O last 3 completed shifts: In: 698.5 [I.V.:241; IV Piggyback:457.5] Out: 6475 [Urine:6275; Stool:200]  PHYSICAL EXAMINATION: General: Ill-appearing man, mechanically ventilated, sedate on morphine drip Neuro: Sedate, not following commands HEENT: [place]/AT, PERRLA, EOM-I PULM: CTA b/l, no crackles, no wheezing CV:  Regular,  no murmur GI: soft, NTND, positive bowel sounds Extremities: 2+ tight nonpitting Bilateral upper extremities and 1+ BLE edema  LABS:  BMET  Recent Labs Lab 11/10/16 0549  11/10/16 2250 11/11/16 0412 11/12/16 0409  NA 150*  < > 145 143 136  K 4.4  --   --  4.8 4.5  CL 121*  --   --  114* 107  CO2 22  --   --  21* 20*  BUN 48*  --   --  64* 75*  CREATININE 0.70  --   --  1.19 1.34*  GLUCOSE 167*  --   --  108* 149*  < > = values in this interval not displayed.  Electrolytes  Recent Labs Lab 11/10/16 0549 11/11/16 0412 11/12/16 0409  CALCIUM 9.0 8.7* 8.2*  MG 2.3 2.2 2.1  PHOS 3.6 5.0* 4.9*    CBC  Recent Labs Lab 11/10/16 0549 11/11/16 0412 11/12/16 0409  WBC 11.0* 13.9* 9.4  HGB 8.0* 8.4* 8.8*  HCT 25.3* 27.3* 28.7*  PLT 260 263 220    Coag's  Recent Labs Lab 11/10/16 0549  INR 1.03    Sepsis Markers No results for input(s): LATICACIDVEN, PROCALCITON, O2SATVEN in the last 168 hours.  ABG  Recent Labs Lab 11/08/16 0917  PHART 7.433  PCO2ART 32.5  PO2ART 99.4    Liver Enzymes No results for input(s): AST, ALT, ALKPHOS, BILITOT, ALBUMIN in the last 168 hours.  Cardiac Enzymes No results for  input(s): TROPONINI, PROBNP in the last 168 hours.  Glucose  Recent Labs Lab 11/11/16 1137 11/11/16 1623 11/11/16 1945 11/12/16 0001 11/12/16 0349 11/12/16 0734  GLUCAP 117* 132* 67 107* 162* 130*    Imaging No results found. STUDIES:  EEG 9/27 >> lateralized periodic discharge Rt posterior quadrant with delta morphology EEG 10/9 >> diffuse encephalopathy, more severe cortical dysfunction of R hemisphere, no seizures   Echo 9/30 >> EF 55 to 60%, mod LVH, grade 1 DD, mild MR  MRI 10/8 >> Post-op changes surrounding biopsy site with residual mass and edema   CULTURES: Blood 10/01 >> negative Brain Bx 10/05 >>Grade III Glioma    SIGNIFICANT EVENTS: 09/27 Admit 10/05 Craniotomy with biopsy collected 10/8- not waking up, neuro re  called  LINES/TUBES: ETT 10/05 >>  Lt femoral CVL 10/05 >>10/17 R-[place] CVC 10/17 >> Right IJ single-lumen IV 10/12 >> removed  ASSESSMENT / PLAN: NEURO: 1. Acute encephalopathy with bilateral upper extremity paresis  2. Frontal lobe mass s/p craniotomy. Bx with Grade III Glioma  - Continue current antiepileptic regimen as a measure of comfort - D/C steroids  RESP: 1. Acute Hypoxic Respiratory failure: - Terminal extubation today  RENAL: 1. Hx ESRD, S/p renal transplant; now with mild AKI: - D/C blood draws  2. Hypernatremia (resolved)  GI: 1. GI bleed 10/03 (resolved) - D/C PPI  2. Hyperammonemia (resolved as of 10/17) - D/C tube feeding and Lactulose  ENDO 1. Hyperglycemia: - D/C SSI and CBGs  CV: 1. Hypertension, HLD. - D/C all BP medications  HEME: 1. Anemia (stable) of critical illness, chronic disease, and GI bleed (resolved).  2. Bilateral Upper extremity DVT's: - D/C blood draws  Met with family, confirmed DNR and withdrawal.  Palliative involved, appreciate input.  The patient is critically ill with multiple organ systems failure and requires high complexity decision making for assessment and support, frequent evaluation and titration of therapies, application of advanced monitoring technologies and extensive interpretation of multiple databases.   Critical Care Time devoted to patient care services described in this note is  35  Minutes. This time reflects time of care of this signee Dr Jennet Maduro. This critical care time does not reflect procedure time, or teaching time or supervisory time of PA/NP/Med student/Med Resident etc but could involve care discussion time.  Rush Farmer, M.D. [facility] Pulmonary/Critical Care Medicine. Pager: [phone]. After hours pager: [phone].

## 2016-11-25 DEATH — deceased

## 2016-12-02 LAB — FUNGUS CULTURE WITH STAIN

## 2016-12-02 LAB — FUNGUS CULTURE RESULT

## 2016-12-02 LAB — FUNGAL ORGANISM REFLEX

## 2016-12-08 ENCOUNTER — Ambulatory Visit: Payer: Medicare HMO | Admitting: Cardiovascular Disease

## 2016-12-12 LAB — ACID FAST CULTURE WITH REFLEXED SENSITIVITIES (MYCOBACTERIA): Acid Fast Culture: NEGATIVE

## 2017-10-25 IMAGING — DX DG CHEST 1V PORT
1 series · 1 of 1 positions shown · non-contrast
Comparison: November 05, 2016

CLINICAL DATA: Hypoxia

EXAM:
PORTABLE CHEST 1 VIEW

[chest ap]
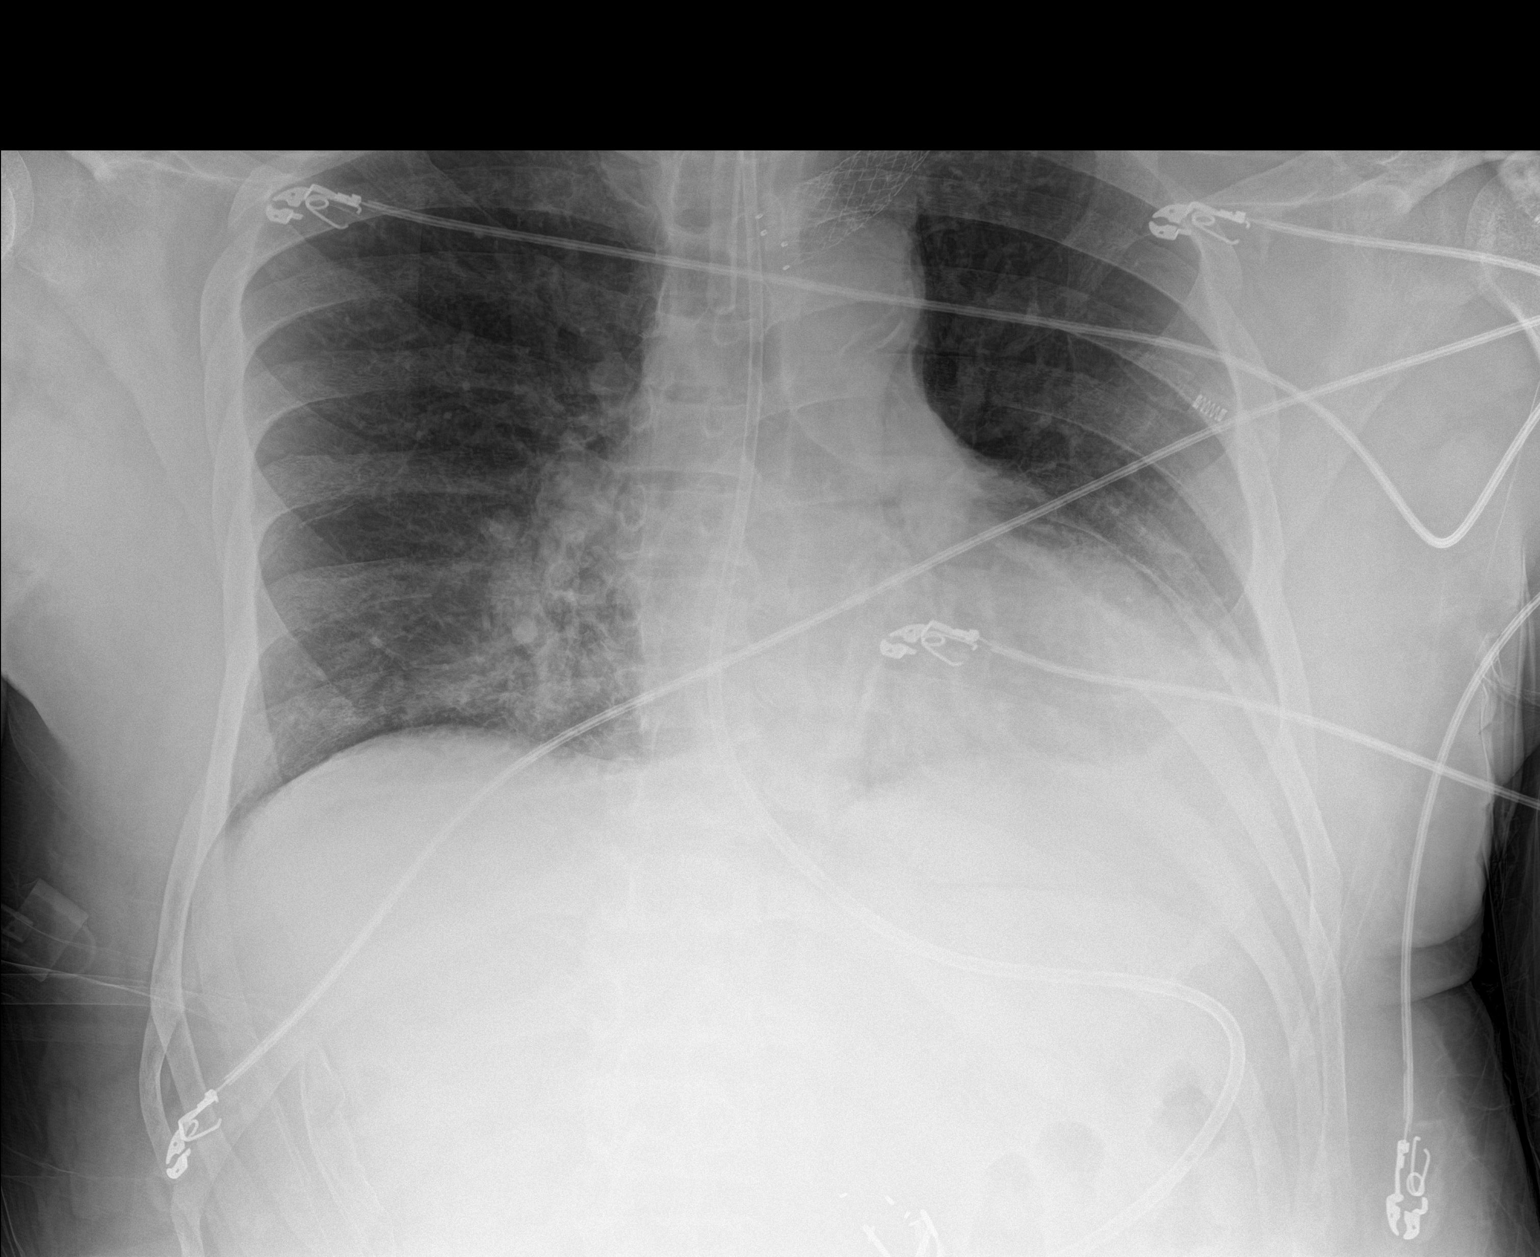

[1 of 1 positions shown; findings below may reference images not displayed]

FINDINGS: Endotracheal tube tip is 3.2 cm above the carina. Feeding tube tip
is below the diaphragm. No pneumothorax. There is focal airspace
consolidation in the left base with small left pleural effusion.
Lungs elsewhere are clear. Heart is mildly enlarged with pulmonary
vascularity within normal limits. There is aortic atherosclerosis.
There is a left innominate regions stent no evident bone lesions. No
adenopathy evident.
IMPRESSION: Tube positions as described without pneumothorax. Patchy
consolidation left base with small left pleural effusion. Lungs
elsewhere clear. Stable cardiac silhouette. There is aortic
atherosclerosis.

Aortic Atherosclerosis (21637-M3C.C).
